# Patient Record
Sex: Female | Born: 1980 | Race: Black or African American | Hispanic: No | Marital: Married | Smoking: Never smoker
Health system: Southern US, Community
[De-identification: ages and names within clinical notes are randomized; demographics above are authoritative.]

## PROBLEM LIST (undated history)

## (undated) DIAGNOSIS — D571 Sickle-cell disease without crisis: Secondary | ICD-10-CM

## (undated) DIAGNOSIS — Z5189 Encounter for other specified aftercare: Secondary | ICD-10-CM

## (undated) DIAGNOSIS — R569 Unspecified convulsions: Secondary | ICD-10-CM

## (undated) DIAGNOSIS — I639 Cerebral infarction, unspecified: Secondary | ICD-10-CM

## (undated) DIAGNOSIS — D649 Anemia, unspecified: Secondary | ICD-10-CM

## (undated) DIAGNOSIS — G43909 Migraine, unspecified, not intractable, without status migrainosus: Secondary | ICD-10-CM

## (undated) HISTORY — PX: OTHER SURGICAL HISTORY: SHX169

## (undated) HISTORY — PX: PORTACATH PLACEMENT: SHX2246

## (undated) HISTORY — PX: CHOLECYSTECTOMY: SHX55

---
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
## 2015-03-11 ENCOUNTER — Inpatient Hospital Stay (HOSPITAL_COMMUNITY)
Admission: EM | Admit: 2015-03-11 | Discharge: 2015-03-13 | DRG: 812 | Disposition: A | Discharge status: Home / Self Care | Payer: Medicaid Other | Attending: Internal Medicine | Admitting: Internal Medicine

## 2015-03-11 ENCOUNTER — Emergency Department (HOSPITAL_COMMUNITY): Payer: Medicaid Other

## 2015-03-11 ENCOUNTER — Encounter (HOSPITAL_COMMUNITY): Payer: Self-pay | Admitting: *Deleted

## 2015-03-11 DIAGNOSIS — D649 Anemia, unspecified: Secondary | ICD-10-CM | POA: Diagnosis present

## 2015-03-11 DIAGNOSIS — Z23 Encounter for immunization: Secondary | ICD-10-CM

## 2015-03-11 DIAGNOSIS — D5700 Hb-SS disease with crisis, unspecified: Principal | ICD-10-CM | POA: Diagnosis present

## 2015-03-11 DIAGNOSIS — Z886 Allergy status to analgesic agent status: Secondary | ICD-10-CM

## 2015-03-11 DIAGNOSIS — D571 Sickle-cell disease without crisis: Secondary | ICD-10-CM | POA: Diagnosis not present

## 2015-03-11 DIAGNOSIS — I69354 Hemiplegia and hemiparesis following cerebral infarction affecting left non-dominant side: Secondary | ICD-10-CM

## 2015-03-11 DIAGNOSIS — Z79899 Other long term (current) drug therapy: Secondary | ICD-10-CM

## 2015-03-11 DIAGNOSIS — Z7982 Long term (current) use of aspirin: Secondary | ICD-10-CM

## 2015-03-11 DIAGNOSIS — Z8673 Personal history of transient ischemic attack (TIA), and cerebral infarction without residual deficits: Secondary | ICD-10-CM

## 2015-03-11 DIAGNOSIS — M21372 Foot drop, left foot: Secondary | ICD-10-CM | POA: Diagnosis present

## 2015-03-11 DIAGNOSIS — R569 Unspecified convulsions: Secondary | ICD-10-CM

## 2015-03-11 DIAGNOSIS — R0602 Shortness of breath: Secondary | ICD-10-CM

## 2015-03-11 DIAGNOSIS — Z79891 Long term (current) use of opiate analgesic: Secondary | ICD-10-CM

## 2015-03-11 DIAGNOSIS — Z885 Allergy status to narcotic agent status: Secondary | ICD-10-CM

## 2015-03-11 DIAGNOSIS — Z888 Allergy status to other drugs, medicaments and biological substances status: Secondary | ICD-10-CM

## 2015-03-11 HISTORY — DX: Sickle-cell disease without crisis: D57.1

## 2015-03-11 HISTORY — DX: Unspecified convulsions: R56.9

## 2015-03-11 LAB — CBC WITH DIFFERENTIAL/PLATELET
Basophils Absolute: 0 10*3/uL (ref 0.0–0.1)
Basophils Relative: 0 % (ref 0–1)
Eosinophils Absolute: 1.1 10*3/uL — ABNORMAL HIGH (ref 0.0–0.7)
Eosinophils Relative: 6 % — ABNORMAL HIGH (ref 0–5)
HCT: 18.6 % — ABNORMAL LOW (ref 36.0–46.0)
Hemoglobin: 6.8 g/dL — CL (ref 12.0–15.0)
Lymphocytes Relative: 29 % (ref 12–46)
Lymphs Abs: 5.5 10*3/uL — ABNORMAL HIGH (ref 0.7–4.0)
MCH: 36.2 pg — ABNORMAL HIGH (ref 26.0–34.0)
MCHC: 36.6 g/dL — ABNORMAL HIGH (ref 30.0–36.0)
MCV: 98.9 fL (ref 78.0–100.0)
Monocytes Absolute: 2.1 10*3/uL — ABNORMAL HIGH (ref 0.1–1.0)
Monocytes Relative: 11 % (ref 3–12)
Neutro Abs: 10.4 10*3/uL — ABNORMAL HIGH (ref 1.7–7.7)
Neutrophils Relative %: 54 % (ref 43–77)
Platelets: 495 10*3/uL — ABNORMAL HIGH (ref 150–400)
RBC: 1.88 MIL/uL — ABNORMAL LOW (ref 3.87–5.11)
RDW: 26.3 % — ABNORMAL HIGH (ref 11.5–15.5)
WBC: 19.1 10*3/uL — ABNORMAL HIGH (ref 4.0–10.5)

## 2015-03-11 LAB — COMPREHENSIVE METABOLIC PANEL
ALT: 36 U/L (ref 14–54)
AST: 79 U/L — ABNORMAL HIGH (ref 15–41)
Albumin: 4.3 g/dL (ref 3.5–5.0)
Alkaline Phosphatase: 114 U/L (ref 38–126)
Anion gap: 9 (ref 5–15)
BUN: 8 mg/dL (ref 6–20)
CO2: 26 mmol/L (ref 22–32)
Calcium: 9.4 mg/dL (ref 8.9–10.3)
Chloride: 100 mmol/L — ABNORMAL LOW (ref 101–111)
Creatinine, Ser: 0.54 mg/dL (ref 0.44–1.00)
GFR calc Af Amer: >60 mL/min (ref >60)
GFR calc non Af Amer: >60 mL/min (ref >60)
Glucose, Bld: 89 mg/dL (ref 65–99)
Potassium: 3.8 mmol/L (ref 3.5–5.1)
Sodium: 135 mmol/L (ref 135–145)
Total Bilirubin: 2.3 mg/dL — ABNORMAL HIGH (ref 0.3–1.2)
Total Protein: 8.4 g/dL — ABNORMAL HIGH (ref 6.5–8.1)

## 2015-03-11 LAB — I-STAT TROPONIN, ED: Troponin i, poc: 0.01 ng/mL (ref 0.00–0.08)

## 2015-03-11 MED ORDER — HYDROMORPHONE 0.3 MG/ML IV SOLN
INTRAVENOUS | Status: DC
  Administered 2015-03-12: 0.9 mg via INTRAVENOUS
  Administered 2015-03-12: 01:00:00 via INTRAVENOUS
  Administered 2015-03-12: 2 mg via INTRAVENOUS
  Administered 2015-03-12: 1.5 mg via INTRAVENOUS
  Filled 2015-03-11: qty 25

## 2015-03-11 MED ORDER — SODIUM CHLORIDE 0.9 % IV BOLUS (SEPSIS)
1000.0000 mL | Freq: Once | INTRAVENOUS | Status: AC
  Administered 2015-03-11: 1000 mL via INTRAVENOUS

## 2015-03-11 MED ORDER — LAMOTRIGINE 200 MG PO TABS
200.0000 mg | ORAL_TABLET | Freq: Two times a day (BID) | ORAL | Status: DC
  Administered 2015-03-11 – 2015-03-13 (×4): 200 mg via ORAL
  Filled 2015-03-11 (×4): qty 1

## 2015-03-11 MED ORDER — DOCUSATE SODIUM 100 MG PO CAPS
100.0000 mg | ORAL_CAPSULE | Freq: Every day | ORAL | Status: DC | PRN
  Administered 2015-03-11 – 2015-03-13 (×2): 100 mg via ORAL
  Filled 2015-03-11 (×2): qty 1

## 2015-03-11 MED ORDER — MORPHINE SULFATE 4 MG/ML IJ SOLN
4.0000 mg | Freq: Once | INTRAMUSCULAR | Status: AC
  Administered 2015-03-11: 4 mg via INTRAVENOUS
  Filled 2015-03-11: qty 1

## 2015-03-11 MED ORDER — PROMETHAZINE HCL 25 MG/ML IJ SOLN
25.0000 mg | INTRAMUSCULAR | Status: DC | PRN
  Administered 2015-03-11 – 2015-03-13 (×8): 25 mg via INTRAVENOUS
  Filled 2015-03-11 (×8): qty 1

## 2015-03-11 MED ORDER — OXYCODONE HCL ER 10 MG PO T12A
30.0000 mg | EXTENDED_RELEASE_TABLET | Freq: Two times a day (BID) | ORAL | Status: DC
  Administered 2015-03-11 – 2015-03-13 (×4): 30 mg via ORAL
  Filled 2015-03-11 (×4): qty 3

## 2015-03-11 MED ORDER — ASPIRIN EC 81 MG PO TBEC
81.0000 mg | DELAYED_RELEASE_TABLET | Freq: Every day | ORAL | Status: DC
  Administered 2015-03-12 – 2015-03-13 (×2): 81 mg via ORAL
  Filled 2015-03-11 (×2): qty 1

## 2015-03-11 MED ORDER — SENNOSIDES-DOCUSATE SODIUM 8.6-50 MG PO TABS
1.0000 | ORAL_TABLET | Freq: Two times a day (BID) | ORAL | Status: DC
  Administered 2015-03-12 – 2015-03-13 (×3): 1 via ORAL
  Filled 2015-03-11 (×3): qty 1

## 2015-03-11 MED ORDER — ONDANSETRON HCL 4 MG/2ML IJ SOLN: 4.0000 mg | Freq: Four times a day (QID) | INTRAMUSCULAR | Status: DC | PRN

## 2015-03-11 MED ORDER — SODIUM CHLORIDE 0.9 % IJ SOLN: 9.0000 mL | INTRAMUSCULAR | Status: DC | PRN

## 2015-03-11 MED ORDER — FOLIC ACID 1 MG PO TABS
1.0000 mg | ORAL_TABLET | Freq: Every day | ORAL | Status: DC
  Filled 2015-03-11: qty 1

## 2015-03-11 MED ORDER — POLYETHYLENE GLYCOL 3350 17 G PO PACK
17.0000 g | PACK | Freq: Every day | ORAL | Status: DC | PRN
  Administered 2015-03-12 – 2015-03-13 (×2): 17 g via ORAL
  Filled 2015-03-11 (×3): qty 1

## 2015-03-11 MED ORDER — DIPHENHYDRAMINE HCL 12.5 MG/5ML PO ELIX: 12.5000 mg | ORAL_SOLUTION | Freq: Four times a day (QID) | ORAL | Status: DC | PRN

## 2015-03-11 MED ORDER — DIPHENHYDRAMINE HCL 50 MG/ML IJ SOLN
25.0000 mg | Freq: Four times a day (QID) | INTRAMUSCULAR | Status: DC | PRN
  Administered 2015-03-11 – 2015-03-12 (×3): 25 mg via INTRAVENOUS
  Filled 2015-03-11 (×3): qty 1

## 2015-03-11 MED ORDER — MORPHINE SULFATE 4 MG/ML IJ SOLN
8.0000 mg | Freq: Once | INTRAMUSCULAR | Status: AC
  Administered 2015-03-11: 8 mg via INTRAVENOUS
  Filled 2015-03-11: qty 2

## 2015-03-11 MED ORDER — SODIUM CHLORIDE 0.9 % IV SOLN
Freq: Once | INTRAVENOUS | Status: AC
  Administered 2015-03-11: via INTRAVENOUS

## 2015-03-11 MED ORDER — LEVETIRACETAM 500 MG PO TABS
1000.0000 mg | ORAL_TABLET | Freq: Two times a day (BID) | ORAL | Status: DC
  Administered 2015-03-11 – 2015-03-13 (×4): 1000 mg via ORAL
  Filled 2015-03-11 (×5): qty 2

## 2015-03-11 MED ORDER — FOLIC ACID 1 MG PO TABS
2.0000 mg | ORAL_TABLET | Freq: Every day | ORAL | Status: DC
  Administered 2015-03-12 – 2015-03-13 (×2): 2 mg via ORAL
  Filled 2015-03-11 (×2): qty 2

## 2015-03-11 MED ORDER — HEPARIN SODIUM (PORCINE) 5000 UNIT/ML IJ SOLN
5000.0000 [IU] | Freq: Three times a day (TID) | INTRAMUSCULAR | Status: DC
  Administered 2015-03-11 – 2015-03-13 (×5): 5000 [IU] via SUBCUTANEOUS
  Filled 2015-03-11 (×6): qty 1

## 2015-03-11 MED ORDER — NALOXONE HCL 0.4 MG/ML IJ SOLN: 0.4000 mg | INTRAMUSCULAR | Status: DC | PRN

## 2015-03-11 MED ORDER — DIPHENHYDRAMINE HCL 50 MG/ML IJ SOLN
12.5000 mg | Freq: Four times a day (QID) | INTRAMUSCULAR | Status: DC | PRN
  Filled 2015-03-11: qty 1

## 2015-03-11 NOTE — ED Provider Notes (Signed)
CSN: [id number]     Arrival date & time 03/11/15  1843 History   First MD Initiated Contact with Patient 03/11/15 1914     Chief Complaint  Patient presents with  . Sickle Cell Pain Crisis     (Consider location/radiation/quality/duration/timing/severity/associated sxs/prior Treatment) HPI Comments: Patient with past medical history of sickle cell anemia presents to the emergency department with chief complaint of sickle cell pain crisis. She states that the pain started one week ago. She states that started in her ankles and has progressively moved to all of her joints including knees, hips, elbows, and shoulders. She also states that she has had some mild shortness of breath, which worsens when standing but denies any chest pain. He has tried taking OxyContin and oxycodone with no relief. She is new to [place], and recently moved from [place]. She is working on establishing care with the sickle cell clinic, but has been unable to do so. She denies any fevers or chills. There are no aggravating factors. Patient states that she thinks that her blood counts may be low.  The history is provided by the patient. No language interpreter was used.    Past Medical History  Diagnosis Date  . Sickle cell disease   . Seizures     one time incident, may have been r/t to a pain medication she was prescribed   Past Surgical History  Procedure Laterality Date  . Cholecystectomy    . Cesarean section      x4  . Reverse tubal ligation     No family history on file. History  Substance Use Topics  . Smoking status: Never Smoker   . Smokeless tobacco: Never Used  . Alcohol Use: No   OB History    No data available     Review of Systems  Constitutional: Negative for fever and chills.  Respiratory: Positive for shortness of breath.   Cardiovascular: Negative for chest pain.  Gastrointestinal: Negative for nausea, vomiting, diarrhea and constipation.  Genitourinary: Negative for  dysuria.  Musculoskeletal: Positive for arthralgias.  All other systems reviewed and are negative.     Allergies  Ibuprofen; Zofran; Other; Codeine; Dilaudid; and Hydrocodone  Home Medications   Prior to Admission medications   Medication Sig Start Date End Date Taking? Authorizing Provider  aspirin EC 81 MG tablet Take 1 tablet by mouth daily. 11/03/13  Yes Historical Provider, MD  docusate sodium (COLACE) 100 MG capsule Take 1 capsule by mouth daily as needed for moderate constipation.  03/09/14  Yes Historical Provider, MD  folic acid (FOLVITE) 1 MG tablet Take 2 tablets by mouth daily. 02/13/15  Yes Historical Provider, MD  lamoTRIgine (LAMICTAL) 200 MG tablet Take 1 tablet by mouth 2 (two) times daily. 02/13/15  Yes Historical Provider, MD  levETIRAcetam (KEPPRA) 1000 MG tablet Take 1 tablet by mouth 2 (two) times daily. 02/13/15  Yes Historical Provider, MD  Oxycodone HCl 10 MG TABS Take 1 tablet by mouth every 4 (four) hours. 02/13/15  Yes Historical Provider, MD  OxyCODONE HCl ER 30 MG T12A Take 1 tablet by mouth every 12 (twelve) hours. 02/13/15  Yes Historical Provider, MD  promethazine (PHENERGAN) 25 MG tablet Take 1 tablet by mouth every 8 (eight) hours as needed. n/v 02/13/15  Yes Historical Provider, MD  zolpidem (AMBIEN) 5 MG tablet Take 1 tablet by mouth at bedtime as needed. sleep 02/13/15  Yes Historical Provider, MD   LMP 02/18/2015 Physical Exam  Constitutional: She is oriented to  person, place, and time. She appears well-developed and well-nourished.  HENT:  Head: Normocephalic and atraumatic.  Eyes: Conjunctivae and EOM are normal. Pupils are equal, round, and reactive to light.  Neck: Normal range of motion. Neck supple.  Cardiovascular: Normal rate and regular rhythm.  Exam reveals no gallop and no friction rub.   No murmur heard. Pulmonary/Chest: Effort normal and breath sounds normal. No respiratory distress. She has no wheezes. She has no rales. She exhibits no  tenderness.  Abdominal: Soft. Bowel sounds are normal. She exhibits no distension and no mass. There is no tenderness. There is no rebound and no guarding.  Musculoskeletal: Normal range of motion. She exhibits no edema or tenderness.  Neurological: She is alert and oriented to person, place, and time.  Skin: Skin is warm and dry.  Psychiatric: She has a normal mood and affect. Her behavior is normal. Judgment and thought content normal.  Nursing note and vitals reviewed.   ED Course  Procedures (including critical care time) Results for orders placed or performed during the hospital encounter of 03/11/15  CBC with Differential/Platelet  Result Value Ref Range   WBC 19.1 (H) 4.0 - 10.5 K/uL   RBC 1.88 (L) 3.87 - 5.11 MIL/uL   Hemoglobin 6.8 (LL) 12.0 - 15.0 g/dL   HCT 16.118.6 (L) 09.636.0 - 04.546.0 %   MCV 98.9 78.0 - 100.0 fL   MCH 36.2 (H) 26.0 - 34.0 pg   MCHC 36.6 (H) 30.0 - 36.0 g/dL   RDW 40.926.3 (H) 81.111.5 - 91.415.5 %   Platelets 495 (H) 150 - 400 K/uL   Neutrophils Relative % 54 43 - 77 %   Lymphocytes Relative 29 12 - 46 %   Monocytes Relative 11 3 - 12 %   Eosinophils Relative 6 (H) 0 - 5 %   Basophils Relative 0 0 - 1 %   Neutro Abs 10.4 (H) 1.7 - 7.7 K/uL   Lymphs Abs 5.5 (H) 0.7 - 4.0 K/uL   Monocytes Absolute 2.1 (H) 0.1 - 1.0 K/uL   Eosinophils Absolute 1.1 (H) 0.0 - 0.7 K/uL   Basophils Absolute 0.0 0.0 - 0.1 K/uL   RBC Morphology HOWELL/JOLLY BODIES    WBC Morphology MILD LEFT SHIFT (1-5% METAS, OCC MYELO, OCC BANDS)   Comprehensive metabolic panel  Result Value Ref Range   Sodium 135 135 - 145 mmol/L   Potassium 3.8 3.5 - 5.1 mmol/L   Chloride 100 (L) 101 - 111 mmol/L   CO2 26 22 - 32 mmol/L   Glucose, Bld 89 65 - 99 mg/dL   BUN 8 6 - 20 mg/dL   Creatinine, Ser 7.820.54 0.44 - 1.00 mg/dL   Calcium 9.4 8.9 - 95.610.3 mg/dL   Total Protein 8.4 (H) 6.5 - 8.1 g/dL   Albumin 4.3 3.5 - 5.0 g/dL   AST 79 (H) 15 - 41 U/L   ALT 36 14 - 54 U/L   Alkaline Phosphatase 114 38 - 126 U/L    Total Bilirubin 2.3 (H) 0.3 - 1.2 mg/dL   GFR calc non Af Amer >60 >60 mL/min   GFR calc Af Amer >60 >60 mL/min   Anion gap 9 5 - 15  Reticulocytes  Result Value Ref Range   Retic Ct Pct RESULTS UNAVAILABLE DUE TO INTERFERING SUBSTANCE 0.4 - 3.1 %   RBC. 1.88 (L) 3.87 - 5.11 MIL/uL   Retic Count, Manual NOT CALCULATED 19.0 - 186.0 K/uL  I-Stat Troponin, ED (not at [facility])  Result  Value Ref Range   Troponin i, poc 0.01 0.00 - 0.08 ng/mL   Comment 3           Dg Chest 2 View  03/11/2015   CLINICAL DATA:  Shortness of breath x1 week  EXAM: CHEST  2 VIEW  COMPARISON:  None.  FINDINGS: Lungs are clear.  No pleural effusion or pneumothorax.  The heart is normal in size.  Visualized osseous structures are within normal limits.  IMPRESSION: No evidence of acute cardiopulmonary disease.   Electronically Signed   By: Charline Bills M.D.   On: 03/11/2015 20:17      EKG Interpretation   Date/Time:  Saturday Mar 11 2015 19:53:57 EDT Ventricular Rate:  88 PR Interval:  203 QRS Duration: 85 QT Interval:  391 QTC Calculation: 473 R Axis:   42 Text Interpretation:  Sinus rhythm Borderline prolonged PR interval No old  tracing to compare Confirmed by MILLER  MD, BRIAN ([id number]) on 03/11/2015  8:03:45 PM      MDM   Final diagnoses:  SOB (shortness of breath)  Sickle cell crisis  Symptomatic anemia    Patient with sickle cell disease, having pain crisis 1 week. We'll treat pain, check labs, will reassess.  Patient given several rounds of morphine. She is allergic to Dilaudid. She is repeatedly requesting Demerol, however patient has seizure history, do not feel that Demerol will be appropriate for her. Labs are pending. Patient seen by and discussed with Dr. Hyacinth Meeker. Anticipate admission.  Appreciate Dr. Julian Reil, who will admit the patient.  Recommends transfusing 1 unit.  Hgb is 6.8, trends 7-8.    Roxy Horseman, PA-C 03/11/15 2255  Eber Hong, MD 03/12/15 [phone]

## 2015-03-11 NOTE — H&P (Signed)
[facility] History and Physical  Debra Williamson ZOX:[id number] DOB: [birth year] DOA: 03/11/2015  Referring physician: EDP PCP: No PCP Per Patient   Chief Complaint: Sickle cell pain crisis   HPI: Debra Williamson is a 34 y.o. female with history of HGB-SS disease who presents to the ED with acute pain crisis.  Symptoms onset one week ago.  Started in ankles and progressively moved to all joints including knees, hips, elbows, shoulders.  Associated with fatigue and mild SOB.  SOB is worse with exertion.  No chest pain.  Oxy contin and oxycodone provide no relief.  New to [place] area, recently moved from [place].  Review of Systems: Systems reviewed.  As above, otherwise negative  Past Medical History  Diagnosis Date  . Sickle cell disease   . Seizures     one time incident, may have been r/t to a pain medication she was prescribed   Past Surgical History  Procedure Laterality Date  . Cholecystectomy    . Cesarean section      x4  . Reverse tubal ligation     Social History:  reports that she has never smoked. She has never used smokeless tobacco. She reports that she does not drink alcohol or use illicit drugs.  Allergies  Allergen Reactions  . Ibuprofen Hives  . Zofran [Ondansetron Hcl] Hives and Itching  . Other Itching    Greek Yogurt  . Codeine Hives and Itching  . Dilaudid [Hydromorphone Hcl] Hives and Itching  . Hydrocodone Hives and Itching    No family history on file.   Prior to Admission medications   Medication Sig Start Date End Date Taking? Authorizing Provider  aspirin EC 81 MG tablet Take 1 tablet by mouth daily. 11/03/13  Yes Historical Provider, MD  docusate sodium (COLACE) 100 MG capsule Take 1 capsule by mouth daily as needed for moderate constipation.  03/09/14  Yes Historical Provider, MD  folic acid (FOLVITE) 1 MG tablet Take 2 tablets by mouth daily. 02/13/15  Yes Historical Provider, MD  lamoTRIgine (LAMICTAL) 200 MG tablet Take  1 tablet by mouth 2 (two) times daily. 02/13/15  Yes Historical Provider, MD  levETIRAcetam (KEPPRA) 1000 MG tablet Take 1 tablet by mouth 2 (two) times daily. 02/13/15  Yes Historical Provider, MD  Oxycodone HCl 10 MG TABS Take 1 tablet by mouth every 4 (four) hours. 02/13/15  Yes Historical Provider, MD  OxyCODONE HCl ER 30 MG T12A Take 1 tablet by mouth every 12 (twelve) hours. 02/13/15  Yes Historical Provider, MD  promethazine (PHENERGAN) 25 MG tablet Take 1 tablet by mouth every 8 (eight) hours as needed. n/v 02/13/15  Yes Historical Provider, MD  zolpidem (AMBIEN) 5 MG tablet Take 1 tablet by mouth at bedtime as needed. sleep 02/13/15  Yes Historical Provider, MD   Physical Exam: Filed Vitals:   03/11/15 2253  BP:   Pulse: 98  Temp:   Resp:     BP 110/68 mmHg  Pulse 98  Temp(Src) 98 F (36.7 C) (Oral)  Resp 17  SpO2 97%  LMP 02/18/2015  General Appearance:    Alert, oriented, no distress, appears stated age  Head:    Normocephalic, atraumatic  Eyes:    PERRL, EOMI, sclera non-icteric        Nose:   Nares without drainage or epistaxis. Mucosa, turbinates normal  Throat:   Moist mucous membranes. Oropharynx without erythema or exudate.  Neck:   Supple. No carotid bruits.  No thyromegaly.  No lymphadenopathy.  Back:     No CVA tenderness, no spinal tenderness  Lungs:     Clear to auscultation bilaterally, without wheezes, rhonchi or rales  Chest wall:    No tenderness to palpitation  Heart:    Regular rate and rhythm without murmurs, gallops, rubs  Abdomen:     Soft, non-tender, nondistended, normal bowel sounds, no organomegaly  Genitalia:    deferred  Rectal:    deferred  Extremities:   No clubbing, cyanosis or edema.  Pulses:   2+ and symmetric all extremities  Skin:   Skin color, texture, turgor normal, no rashes or lesions  Lymph nodes:   Cervical, supraclavicular, and axillary nodes normal  Neurologic:   CNII-XII intact. Normal strength, sensation and reflexes       throughout    Labs on Admission:  Basic Metabolic Panel:  Recent Labs Lab 03/11/15 2028  NA 135  K 3.8  CL 100*  CO2 26  GLUCOSE 89  BUN 8  CREATININE 0.54  CALCIUM 9.4   Liver Function Tests:  Recent Labs Lab 03/11/15 2028  AST 79*  ALT 36  ALKPHOS 114  BILITOT 2.3*  PROT 8.4*  ALBUMIN 4.3   No results for input(s): LIPASE, AMYLASE in the last 168 hours. No results for input(s): AMMONIA in the last 168 hours. CBC:  Recent Labs Lab 03/11/15 2028  WBC 19.1*  NEUTROABS 10.4*  HGB 6.8*  HCT 18.6*  MCV 98.9  PLT 495*   Cardiac Enzymes: No results for input(s): CKTOTAL, CKMB, CKMBINDEX, TROPONINI in the last 168 hours.  BNP (last 3 results) No results for input(s): PROBNP in the last 8760 hours. CBG: No results for input(s): GLUCAP in the last 168 hours.  Radiological Exams on Admission: Dg Chest 2 View  03/11/2015   CLINICAL DATA:  Shortness of breath x1 week  EXAM: CHEST  2 VIEW  COMPARISON:  None.  FINDINGS: Lungs are clear.  No pleural effusion or pneumothorax.  The heart is normal in size.  Visualized osseous structures are within normal limits.  IMPRESSION: No evidence of acute cardiopulmonary disease.   Electronically Signed   By: Charline BillsSriyesh  Krishnan M.D.   On: 03/11/2015 20:17    EKG: Independently reviewed.  Assessment/Plan Principal Problem:   Hemoglobin S-S disease Active Problems:   Sickle cell crisis   Symptomatic anemia   1. Sickle cell Crisis -  1. Sickle cell pathway 2. Morphine not working for pain, I see where dilaudid has been used with benadryl in the ED in the past (2014).  Patient states she tolerates dilaudid with benadryl.  Will go ahead and order Dilaudid PCA full dose. 3. Continue home long acting oxycodone 2. Symptomatic anemia - 1. transfuse 1 unit PRBC for HGB of 6.8 2. Recheck CBC in AM. 3. H/o Seizures - 1. Continue Keppra 2. No Demerol (patient initially asked for this).    Code Status: Full Code  Family  Communication: No family in room Disposition Plan: Admit to obs   Time spent: 50 min  Catalino Plascencia M. [facility] Pager [phone]  If 7AM-7PM, please contact the day team taking care of the patient [email or web address] Password [email or web address] 03/11/2015, 11:05 PM

## 2015-03-11 NOTE — ED Notes (Addendum)
Patient c/o bilateral ankle, knee, shoulder and elbow pain related to sickle cell crisis x1 week.  Patient also c/o shortness of breath x1 week.  Patient also c/o nausea, but denies vomiting.  Patient denies fever.  Patient denies chest and abdominal pain.  Patient states she rarely has a [place] crisis.  Patient states she moved here in mid-February and does not have a PCP yet.  [facility] are pulled into chart via Care Everywhere.

## 2015-03-11 NOTE — ED Provider Notes (Signed)
The patient has a history of stroke secondary to sickle cell disease, also has a history of multiple sickle cell crisis in the past, has had approximately one week of worsening joint discomfort, states "I hurt all over" and refuses to be more specific. She did have some chest pain yesterday but has none today, no shortness of breath or fevers. On exam the patient has tenderness to palpation in all 4 extremities, she has no joint effusions, no fever, clear heart and lung sounds. She states that she has an allergy to Dilaudid, feels as though morphine is not strong enough, is requesting Demerol which we cannot provide. Likely will need to be admitted for pain control.  Medical screening examination/treatment/procedure(s) were conducted as a shared visit with non-physician practitioner(s) and myself.  I personally evaluated the patient during the encounter.  Clinical Impression:   Final diagnoses:  SOB (shortness of breath)  Sickle cell crisis  Symptomatic anemia         Eber HongBrian Tenae Graziosi, MD 03/12/15 1515

## 2015-03-12 ENCOUNTER — Encounter (HOSPITAL_COMMUNITY): Payer: Self-pay

## 2015-03-12 DIAGNOSIS — R569 Unspecified convulsions: Secondary | ICD-10-CM

## 2015-03-12 DIAGNOSIS — Z23 Encounter for immunization: Secondary | ICD-10-CM | POA: Diagnosis not present

## 2015-03-12 DIAGNOSIS — Z886 Allergy status to analgesic agent status: Secondary | ICD-10-CM | POA: Diagnosis not present

## 2015-03-12 DIAGNOSIS — M21372 Foot drop, left foot: Secondary | ICD-10-CM | POA: Diagnosis present

## 2015-03-12 DIAGNOSIS — D649 Anemia, unspecified: Secondary | ICD-10-CM | POA: Diagnosis not present

## 2015-03-12 DIAGNOSIS — D5700 Hb-SS disease with crisis, unspecified: Secondary | ICD-10-CM | POA: Diagnosis not present

## 2015-03-12 DIAGNOSIS — D571 Sickle-cell disease without crisis: Secondary | ICD-10-CM | POA: Diagnosis not present

## 2015-03-12 DIAGNOSIS — Z7982 Long term (current) use of aspirin: Secondary | ICD-10-CM | POA: Diagnosis not present

## 2015-03-12 DIAGNOSIS — Z8673 Personal history of transient ischemic attack (TIA), and cerebral infarction without residual deficits: Secondary | ICD-10-CM

## 2015-03-12 DIAGNOSIS — Z888 Allergy status to other drugs, medicaments and biological substances status: Secondary | ICD-10-CM | POA: Diagnosis not present

## 2015-03-12 DIAGNOSIS — Z79891 Long term (current) use of opiate analgesic: Secondary | ICD-10-CM | POA: Diagnosis not present

## 2015-03-12 DIAGNOSIS — I69354 Hemiplegia and hemiparesis following cerebral infarction affecting left non-dominant side: Secondary | ICD-10-CM | POA: Diagnosis not present

## 2015-03-12 DIAGNOSIS — Z79899 Other long term (current) drug therapy: Secondary | ICD-10-CM | POA: Diagnosis not present

## 2015-03-12 DIAGNOSIS — R52 Pain, unspecified: Secondary | ICD-10-CM | POA: Diagnosis not present

## 2015-03-12 DIAGNOSIS — Z885 Allergy status to narcotic agent status: Secondary | ICD-10-CM | POA: Diagnosis not present

## 2015-03-12 LAB — CBC WITH DIFFERENTIAL/PLATELET
Basophils Absolute: 0.1 10*3/uL (ref 0.0–0.1)
Basophils Relative: 1 % (ref 0–1)
Eosinophils Absolute: 0.9 10*3/uL — ABNORMAL HIGH (ref 0.0–0.7)
Eosinophils Relative: 6 % — ABNORMAL HIGH (ref 0–5)
HCT: 23.1 % — ABNORMAL LOW (ref 36.0–46.0)
Hemoglobin: 7.9 g/dL — ABNORMAL LOW (ref 12.0–15.0)
Lymphocytes Relative: 38 % (ref 12–46)
Lymphs Abs: 5.7 10*3/uL — ABNORMAL HIGH (ref 0.7–4.0)
MCH: 33.6 pg (ref 26.0–34.0)
MCHC: 34.2 g/dL (ref 30.0–36.0)
MCV: 98.3 fL (ref 78.0–100.0)
Monocytes Absolute: 1.6 10*3/uL — ABNORMAL HIGH (ref 0.1–1.0)
Monocytes Relative: 11 % (ref 3–12)
Neutro Abs: 6.6 10*3/uL (ref 1.7–7.7)
Neutrophils Relative %: 44 % (ref 43–77)
Platelets: 471 10*3/uL — ABNORMAL HIGH (ref 150–400)
RBC: 2.35 MIL/uL — ABNORMAL LOW (ref 3.87–5.11)
RDW: 28 % — ABNORMAL HIGH (ref 11.5–15.5)
WBC: 14.9 10*3/uL — ABNORMAL HIGH (ref 4.0–10.5)

## 2015-03-12 LAB — PREGNANCY, URINE: Preg Test, Ur: NEGATIVE

## 2015-03-12 LAB — CBC
HCT: 18.2 % — ABNORMAL LOW (ref 36.0–46.0)
Hemoglobin: 6.6 g/dL — CL (ref 12.0–15.0)
MCH: 36.3 pg — ABNORMAL HIGH (ref 26.0–34.0)
MCHC: 36.3 g/dL — ABNORMAL HIGH (ref 30.0–36.0)
MCV: 100 fL (ref 78.0–100.0)
Platelets: 526 10*3/uL — ABNORMAL HIGH (ref 150–400)
RBC: 1.82 MIL/uL — ABNORMAL LOW (ref 3.87–5.11)
RDW: 26.1 % — ABNORMAL HIGH (ref 11.5–15.5)
WBC: 18.7 10*3/uL — ABNORMAL HIGH (ref 4.0–10.5)

## 2015-03-12 LAB — ABO/RH: ABO/RH(D): A POS

## 2015-03-12 LAB — RETICULOCYTES
RBC.: 1.88 MIL/uL — ABNORMAL LOW (ref 3.87–5.11)
RBC.: 2.35 MIL/uL — ABNORMAL LOW (ref 3.87–5.11)
Retic Ct Pct: >23 % — ABNORMAL HIGH (ref 0.4–3.1)
Retic Ct Pct: >23 % — ABNORMAL HIGH (ref 0.4–3.1)

## 2015-03-12 LAB — LACTATE DEHYDROGENASE: LDH: 528 U/L — ABNORMAL HIGH (ref 98–192)

## 2015-03-12 LAB — PREPARE RBC (CROSSMATCH)

## 2015-03-12 MED ORDER — CETYLPYRIDINIUM CHLORIDE 0.05 % MT LIQD
7.0000 mL | Freq: Two times a day (BID) | OROMUCOSAL | Status: DC
  Administered 2015-03-12 – 2015-03-13 (×4): 7 mL via OROMUCOSAL

## 2015-03-12 MED ORDER — PNEUMOCOCCAL VAC POLYVALENT 25 MCG/0.5ML IJ INJ
0.5000 mL | INJECTION | INTRAMUSCULAR | Status: AC
  Administered 2015-03-13: 0.5 mL via INTRAMUSCULAR
  Filled 2015-03-12 (×2): qty 0.5

## 2015-03-12 MED ORDER — DIPHENHYDRAMINE HCL 12.5 MG/5ML PO ELIX
12.5000 mg | ORAL_SOLUTION | Freq: Four times a day (QID) | ORAL | Status: DC | PRN
  Filled 2015-03-12 (×2): qty 5

## 2015-03-12 MED ORDER — SODIUM CHLORIDE 0.9 % IV SOLN
12.5000 mg | Freq: Four times a day (QID) | INTRAVENOUS | Status: DC | PRN
  Administered 2015-03-12 – 2015-03-13 (×4): 12.5 mg via INTRAVENOUS
  Filled 2015-03-12 (×9): qty 0.25

## 2015-03-12 MED ORDER — ONDANSETRON HCL 4 MG/2ML IJ SOLN: 4.0000 mg | Freq: Four times a day (QID) | INTRAMUSCULAR | Status: DC | PRN

## 2015-03-12 MED ORDER — HYDROMORPHONE 2 MG/ML HIGH CONCENTRATION IV PCA SOLN
INTRAVENOUS | Status: DC
  Administered 2015-03-12: 1.6 mg via INTRAVENOUS
  Administered 2015-03-12: 4 mg via INTRAVENOUS
  Administered 2015-03-12: 2 mg via INTRAVENOUS
  Administered 2015-03-13: 3.5 mg via INTRAVENOUS
  Administered 2015-03-13: 0.5 mg via INTRAVENOUS
  Administered 2015-03-13: 1 mg via INTRAVENOUS
  Filled 2015-03-12 (×2): qty 25

## 2015-03-12 MED ORDER — SODIUM CHLORIDE 0.9 % IJ SOLN: 9.0000 mL | INTRAMUSCULAR | Status: DC | PRN

## 2015-03-12 MED ORDER — DEXTROSE-NACL 5-0.45 % IV SOLN
INTRAVENOUS | Status: DC
  Administered 2015-03-12 – 2015-03-13 (×3): via INTRAVENOUS

## 2015-03-12 MED ORDER — NALOXONE HCL 0.4 MG/ML IJ SOLN: 0.4000 mg | INTRAMUSCULAR | Status: DC | PRN

## 2015-03-12 MED ORDER — DIPHENHYDRAMINE HCL 50 MG/ML IJ SOLN
12.5000 mg | Freq: Once | INTRAMUSCULAR | Status: AC
  Administered 2015-03-12: 12.5 mg via INTRAVENOUS

## 2015-03-12 NOTE — Progress Notes (Signed)
Delay in getting labs as pt is a difficult stick and not enough blood was obtained previously to get an LDH. Will continue a few more attempts to obtain blood. Pt is not expected to have a long inpatient stay, so I would like to defer placing a PICC for now. She is stable, so if blood is not able to be obtained, then we will reattempt with morning labs.  Debra Williamson. Lavergne Hiltunen MD

## 2015-03-12 NOTE — Progress Notes (Signed)
Patient ID: Debra DellShanequa Diprima, female   DOB: [birth year], 34 y.o.   MRN: [id number] SICKLE CELL SERVICE PROGRESS NOTE  Debra Williamson [id number] DOB: [birth year] DOA: 03/11/2015 PCP: No PCP Per Patient    Consultants:  none  Procedures:  none  Antibiotics:  none  HPI/Subjective: Pt reports having pain mainly in her joints. She states that pain in those areas are typical for her crisis. She rates pain as a 7/10. She denies any swelling or trauma to joint areas. She reports having some weakness and dizziness prior to coming to ED, but symptoms have resolved. She feels some relief with current pain regimen, but pain is not going less than a 7/10. She takes oxycodone at home for pain. She reports not usually being hospitalized for pain. She states that she is new to the area. [place].  She was getting medication filled by her physician there. 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 She reports not being on hydroxyurea for many years. Pt is trying to get pregnant with husband.  Objective: Filed Vitals:   03/12/15 1013 03/12/15 1134 03/12/15 1400 03/12/15 1519  BP: 102/54  103/56   Pulse: 75  79   Temp: 98.1 F (36.7 C)  98.9 F (37.2 C)   TempSrc: Oral  Oral   Resp: 14 9 16 11   Height:      Weight:      SpO2: 98% 96% 100% 100%   Weight change:   Intake/Output Summary (Last 24 hours) at 03/12/15 1628 Last data filed at 03/12/15 1402  Gross per 24 hour  Intake    660 ml  Output    500 ml  Net    160 ml    General: Alert, awake, oriented x3, in no acute distress. Sitting up in bed. HEENT: [place]/AT PEERL, EOMI, 1+ scleral icterus. Neck: Trachea midline,  no masses or lymphadenopathy OROPHARYNX:  Moist, No exudate/ erythema/lesions.  Heart: Regular rate and rhythm, without murmurs, rubs, gallops Lungs: Clear to auscultation, no wheezing or rhonchi noted.   Abdomen: Soft, nontender, nondistended, positive bowel sounds, no masses no  hepatosplenomegaly noted.  Musculoskeletal: No warmth, swelling or erythema around joints, no spinal tenderness noted. Neuro: exam deferred due to pain Psychiatric: Patient alert and oriented x3, good insight and cognition, good recent to remote recall. Lymph node survey: No cervical axillary or inguinal lymphadenopathy noted. Extremities: no cyanosis, or edema  Data Reviewed: Basic Metabolic Panel:  Recent Labs Lab 03/11/15 2028  NA 135  K 3.8  CL 100*  CO2 26  GLUCOSE 89  BUN 8  CREATININE 0.54  CALCIUM 9.4   Liver Function Tests:  Recent Labs Lab 03/11/15 2028  AST 79*  ALT 36  ALKPHOS 114  BILITOT 2.3*  PROT 8.4*  ALBUMIN 4.3   No results for input(s): LIPASE, AMYLASE in the last 168 hours. No results for input(s): AMMONIA in the last 168 hours. CBC:  Recent Labs Lab 03/11/15 2028 03/12/15 0100  WBC 19.1* 18.7*  NEUTROABS 10.4*  --   HGB 6.8* 6.6*  HCT 18.6* 18.2*  MCV 98.9 100.0  PLT 495* 526*   Cardiac Enzymes: No results for input(s): CKTOTAL, CKMB, CKMBINDEX, TROPONINI in the last 168 hours. BNP (last 3 results) No results for input(s): BNP in the last 8760 hours.  ProBNP (last 3 results) No results for input(s): PROBNP in the last 8760 hours.  CBG: No results for input(s): GLUCAP in the last  168 hours.  No results found for this or any previous visit (from the past 240 hour(s)).   Studies: Dg Chest 2 View  03/11/2015   CLINICAL DATA:  Shortness of breath x1 week  EXAM: CHEST  2 VIEW  COMPARISON:  None.  FINDINGS: Lungs are clear.  No pleural effusion or pneumothorax.  The heart is normal in size.  Visualized osseous structures are within normal limits.  IMPRESSION: No evidence of acute cardiopulmonary disease.   Electronically Signed   By: Charline Bills M.D.   On: 03/11/2015 20:17    Scheduled Meds: . antiseptic oral rinse  7 mL Mouth Rinse BID  . aspirin EC  81 mg Oral Daily  . folic acid  2 mg Oral Daily  . heparin  5,000 Units  Subcutaneous 3 times per day  . HYDROmorphone PCA 2 mg/mL   Intravenous 6 times per day  . lamoTRIgine  200 mg Oral BID  . levETIRAcetam  1,000 mg Oral BID  . OxyCODONE  30 mg Oral Q12H  . [START ON 03/13/2015] pneumococcal 23 valent vaccine  0.5 mL Intramuscular Tomorrow-1000  . senna-docusate  1 tablet Oral BID   Continuous Infusions: . dextrose 5 % and 0.45% NaCl 125 mL/hr at 03/12/15 1259    Principal Problem:   Hemoglobin S-S disease Active Problems:   Sickle cell crisis   Symptomatic anemia   H/O: stroke   Seizures   Assessment/Plan: Principal Problem:   Hemoglobin S-S disease Active Problems:   Sickle cell crisis   Symptomatic anemia   H/O: stroke   Seizures  1)Sickle Cell Crisis: Care everywhere was reviewed. Pt has Hgb SS disease. She followed with Dr. Norm Parcel at [facility] in [place], [place].  Pt presented with pain consistent with VOC. At home pt takes Oxycodone ER  q12h, and Oxycodone  q4h PRN, giving her a MME of 180. Pt's pain is currently a 7/10. Since she is opioid tolerant and feeling minimal relief with current PCA setting, I will switch her to a high dose Dilaudid PCA of 0.5mg  q10 min. I will reassess her and adjust PCA as necessary. Will avoid NSAIDs as she reports an allergy to ibuprofen. Continue Oxycodone ER  for long acting control.   2) Anemia: Hgb of 6.6 post 1 unit PRBC. According to records, her baseline Hgb is 7.8. Anemia is likely due to hemolysis. Will add on LDH to morning labs. Will f/u Hgb with CBC today.  3)History of stroke and seizures: Noted to have left sided hemiparesis and left foot drop in records. She is on Keppra and Lamictal. Will continue her home meds.   4)Sickle Cell Care: Continue Folic acid. Not on Hydrea.  5)Family Planning: Pt had 4 births via C-section and 1 miscarriage. She has a total of 8 children in her home. She reports that her and her husband are trying to get pregnant. Will send urine  pregnancy.  6) FEN/GI : Regular Diet  IV fluids per protocol-D5/0.45% /hr Bowel regimen in place   Code Status: Full  DVT Prophylaxis: heparin  Family Communication: none  Disposition Plan: pending improvement. Pt will need to establish care with a new PCP and neurologist. Time spent:>3min  Serina Cowper  Pager [phone]. If 7PM-7AM, please contact night-coverage.  03/12/2015, 4:28 PM  LOS: 0 days   Serina Cowper

## 2015-03-13 DIAGNOSIS — D649 Anemia, unspecified: Secondary | ICD-10-CM

## 2015-03-13 LAB — BASIC METABOLIC PANEL
Anion gap: 7 (ref 5–15)
BUN: 9 mg/dL (ref 6–20)
CO2: 26 mmol/L (ref 22–32)
Calcium: 8.8 mg/dL — ABNORMAL LOW (ref 8.9–10.3)
Chloride: 106 mmol/L (ref 101–111)
Creatinine, Ser: 0.6 mg/dL (ref 0.44–1.00)
GFR calc Af Amer: >60 mL/min (ref >60)
GFR calc non Af Amer: >60 mL/min (ref >60)
Glucose, Bld: 104 mg/dL — ABNORMAL HIGH (ref 65–99)
Potassium: 4.1 mmol/L (ref 3.5–5.1)
Sodium: 139 mmol/L (ref 135–145)

## 2015-03-13 LAB — CBC WITH DIFFERENTIAL/PLATELET
Basophils Absolute: 0.1 10*3/uL (ref 0.0–0.1)
Basophils Relative: 1 % (ref 0–1)
Eosinophils Absolute: 1.1 10*3/uL — ABNORMAL HIGH (ref 0.0–0.7)
Eosinophils Relative: 8 % — ABNORMAL HIGH (ref 0–5)
HCT: 22.9 % — ABNORMAL LOW (ref 36.0–46.0)
Hemoglobin: 8 g/dL — ABNORMAL LOW (ref 12.0–15.0)
Lymphocytes Relative: 35 % (ref 12–46)
Lymphs Abs: 4.9 10*3/uL — ABNORMAL HIGH (ref 0.7–4.0)
MCH: 34 pg (ref 26.0–34.0)
MCHC: 34.9 g/dL (ref 30.0–36.0)
MCV: 97.4 fL (ref 78.0–100.0)
Monocytes Absolute: 1.6 10*3/uL — ABNORMAL HIGH (ref 0.1–1.0)
Monocytes Relative: 11 % (ref 3–12)
Neutro Abs: 6.4 10*3/uL (ref 1.7–7.7)
Neutrophils Relative %: 45 % (ref 43–77)
Platelets: 539 10*3/uL — ABNORMAL HIGH (ref 150–400)
RBC: 2.35 MIL/uL — ABNORMAL LOW (ref 3.87–5.11)
RDW: 27.8 % — ABNORMAL HIGH (ref 11.5–15.5)
WBC: 14.1 10*3/uL — ABNORMAL HIGH (ref 4.0–10.5)

## 2015-03-13 LAB — TYPE AND SCREEN
ABO/RH(D): A POS
Antibody Screen: NEGATIVE
Unit division: 0

## 2015-03-13 LAB — LACTATE DEHYDROGENASE: LDH: 339 U/L — ABNORMAL HIGH (ref 98–192)

## 2015-03-13 LAB — RETICULOCYTES
RBC.: 2.35 MIL/uL — ABNORMAL LOW (ref 3.87–5.11)
Retic Ct Pct: >23 % — ABNORMAL HIGH (ref 0.4–3.1)

## 2015-03-13 MED ORDER — ZOLPIDEM TARTRATE 5 MG PO TABS: 5.0000 mg | ORAL_TABLET | Freq: Every evening | ORAL | Status: DC | PRN

## 2015-03-13 MED ORDER — NALOXEGOL OXALATE 25 MG PO TABS
25.0000 mg | ORAL_TABLET | Freq: Every day | ORAL | Status: DC
  Administered 2015-03-13: 25 mg via ORAL
  Filled 2015-03-13: qty 1

## 2015-03-13 MED ORDER — DIPHENHYDRAMINE HCL 12.5 MG/5ML PO ELIX: 12.5000 mg | ORAL_SOLUTION | Freq: Four times a day (QID) | ORAL | Status: DC | PRN

## 2015-03-13 MED ORDER — DIPHENHYDRAMINE HCL 50 MG/ML IJ SOLN
25.0000 mg | Freq: Four times a day (QID) | INTRAMUSCULAR | Status: DC | PRN
  Administered 2015-03-13: 25 mg via INTRAVENOUS
  Filled 2015-03-13 (×3): qty 0.5

## 2015-03-13 NOTE — Care Management Note (Signed)
Case Management Note  Patient Details  Name: Debra Williamson MRN: [id number] Date of Birth: 1981/09/02  Subjective/Objective:    34 y/o f admitted w/SSC.                Action/Plan:d/c home no needs or orders.   Expected Discharge Date:                  Expected Discharge Plan:  Home/Self Care  In-House Referral:     Discharge planning Services  CM Consult  Post Acute Care Choice:    Choice offered to:     DME Arranged:    DME Agency:     HH Arranged:    HH Agency:     Status of Service:  Completed, signed off  Medicare Important Message Given:    Date Medicare IM Given:    Medicare IM give by:    Date Additional Medicare IM Given:    Additional Medicare Important Message give by:     If discussed at Long Length of Stay Meetings, dates discussed:    Additional Comments:  Lanier ClamMahabir, Naasia Weilbacher, RN 03/13/2015, 1:03 PM

## 2015-03-13 NOTE — Progress Notes (Signed)
Wasted 20ml of Dilaudid from the PCA; pt is discharged home.

## 2015-03-13 NOTE — Discharge Summary (Signed)
Debra Williamson MRN: [id number] DOB/AGE: September 18, 1981 33 y.o.  Admit date: 03/11/2015 Discharge date: 03/13/2015  Primary Care Physician:  No PCP Per Patient   Discharge Diagnoses:   Patient Active Problem List   Diagnosis Date Noted  . H/O: stroke 03/12/2015  . Seizures 03/12/2015  . 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 Sickle cell crisis 03/11/2015  . Symptomatic anemia 03/11/2015    DISCHARGE MEDICATION:   Medication List    TAKE these medications        aspirin EC 81 MG tablet  Take 1 tablet by mouth daily.     docusate sodium 100 MG capsule  Commonly known as:  COLACE  Take 1 capsule by mouth daily as needed for moderate constipation.     folic acid 1 MG tablet  Commonly known as:  FOLVITE  Take 2 tablets by mouth daily.     lamoTRIgine 200 MG tablet  Commonly known as:  LAMICTAL  Take 1 tablet by mouth 2 (two) times daily.     levETIRAcetam 1000 MG tablet  Commonly known as:  KEPPRA  Take 1 tablet by mouth 2 (two) times daily.     OxyCODONE HCl ER 30 MG T12a  Take 1 tablet by mouth every 12 (twelve) hours.     Oxycodone HCl 10 MG Tabs  Take 1 tablet by mouth every 4 (four) hours as needed (pain).     promethazine 25 MG tablet  Commonly known as:  PHENERGAN  Take 1 tablet by mouth every 8 (eight) hours as needed. n/v     zolpidem 5 MG tablet  Commonly known as:  AMBIEN  Take 1 tablet (5 mg total) by mouth at bedtime as needed. sleep          SIGNIFICANT DIAGNOSTIC STUDIES:  Dg Chest 2 View  03/11/2015   CLINICAL DATA:  Shortness of breath x1 week  EXAM: CHEST  2 VIEW  COMPARISON:  None.  FINDINGS: Lungs are clear.  No pleural effusion or pneumothorax.  The heart is normal in size.  Visualized osseous structures are within normal limits.  IMPRESSION: No evidence of acute cardiopulmonary disease.   Electronically Signed   By: Charline Bills M.D.   On: 03/11/2015 20:17     No results found for this or any previous visit (from the past 240  hour(s)).  BRIEF ADMITTING H & P: Debra Williamson is a 34 y.o. female with history of HGB-SS disease who presents to the ED with acute pain crisis. Symptoms onset one week ago. Started in ankles and progressively moved to all joints including knees, hips, elbows, shoulders. Associated with fatigue and mild SOB. SOB is worse with exertion. No chest pain. Oxy contin and oxycodone provide no relief. New to [place] area, recently moved from [place].   Hospital Course:  Present on Admission:  . Hemoglobin SS disease with crisis: Pt was admitted in crisis. She was managed with IVF and Dialudid via PCA. NSAID's were not used in managing her pain as she reports anaphylaxis with use of NSAID's. Pt was very eager to be discharged to home as she has young kids that she needed to get home to. She felt that she was at a point in her pain which she could manage at home with oral analgesics. She had no medical risk requiring further hospitalization.   . H/O Seizure: Pt has no seizure activity while hospitalized. Continue Keppra and Lamictal.  . Symptomatic anemia: Pt reports a baseline Hb of 7.8. At the  time of admission she received 1 unit RBC;s and her Hb at the time of D/C is 8.0.   Disposition and Follow-up: Pt is discharged in good condition. She is in te process of establishing with a PMD. She has been [facility]     Discharge Instructions    Activity as tolerated - No restrictions    Complete by:  As directed      Diet general    Complete by:  As directed            DISCHARGE EXAM:  General: Alert, awake, oriented x3, in NAD.  Vital Signs: BP 115/59, HR 83, T 98.1 F (36.7 C), temperature source Oral, RR 14, height 5\' 4"  (1.626 m), weight 156 lb 11.2 oz (71.079 kg), last menstrual period 02/18/2015, SpO2 100 %. HEENT: [place]/AT PEERL, EOMI, anicteric. Neck: Trachea midline, no masses, no thyromegal,y no JVD, no carotid bruit OROPHARYNX:  Moist, No exudate/ erythema/lesions.  Heart: Regular rate and rhythm, without murmurs, rubs, gallops or S3. PMI non-displaced. Exam reveals no decreased pulses. Pulmonary/Chest: Normal effort. Breath sounds normal. No. Apnea. Clear to auscultation,no stridor,  no wheezing and no rhonchi noted. No respiratory distress and no tenderness noted. Abdomen: Soft, nontender, nondistended, normal bowel sounds, no masses no hepatosplenomegaly noted. No fluid wave and no ascites. There is no guarding or rebound. Neuro: Alert and oriented to person, place and time. Normal motor skills, Displays no atrophy or tremors and exhibits normal muscle tone.  No focal neurological deficits noted cranial nerves II through XII grossly intact. No sensory deficit noted. Strength at baseline in bilateral upper and lower extremities. Gait normal. Musculoskeletal: No warm swelling or erythema around joints, no spinal tenderness noted. Psychiatric: Patient alert and oriented x3, good insight and cognition, good recent to remote recall. Lymph node survey: No cervical axillary or inguinal lymphadenopathy noted. Skin: Skin is warm and dry.  Psychiatric: Mood, memory, affect and judgement normal     Recent Labs  03/11/15 2028 03/13/15 0458  NA 135 139  K 3.8 4.1  CL 100* 106  CO2 26 26  GLUCOSE 89 104*  BUN 8 9  CREATININE 0.54 0.60  CALCIUM 9.4 8.8*    Recent Labs  03/11/15 2028  AST 79*  ALT 36  ALKPHOS 114  BILITOT 2.3*  PROT 8.4*  ALBUMIN 4.3   No results for input(s): LIPASE, AMYLASE in the last 72 hours.  Recent Labs  03/12/15 1830 03/13/15 0458  WBC 14.9* 14.1*  NEUTROABS 6.6 6.4  HGB 7.9* 8.0*  HCT 23.1* 22.9*  MCV 98.3 97.4  PLT 471* 539*     Total time spent including face to face and decision making was greater than 30 minutes  Signed: Charleton Deyoung A. 03/13/2015, 12:22 PM

## 2015-03-13 NOTE — Progress Notes (Signed)
Pt ambulated up the hallway without any shortness of breath or respiratory distress. Pts oxygen saturation prior to ambulating was 100% on RA, while ambulating oxygen saturation maintained 98% RA, and after ambulating pt's oxygen saturations were 98% on RA.

## 2015-03-13 NOTE — Progress Notes (Signed)
Pt discharged home with spouse. Pt verbalized understanding discharge instructions, prescriptions, and follow-up appts. Pt denied any complaints or concerns at the time of discharge.

## 2015-05-30 DIAGNOSIS — D571 Sickle-cell disease without crisis: Secondary | ICD-10-CM | POA: Insufficient documentation

## 2015-05-30 DIAGNOSIS — M25373 Other instability, unspecified ankle: Secondary | ICD-10-CM | POA: Insufficient documentation

## 2015-05-30 DIAGNOSIS — G40909 Epilepsy, unspecified, not intractable, without status epilepticus: Secondary | ICD-10-CM | POA: Insufficient documentation

## 2015-05-30 DIAGNOSIS — I69359 Hemiplegia and hemiparesis following cerebral infarction affecting unspecified side: Secondary | ICD-10-CM | POA: Insufficient documentation

## 2015-08-08 ENCOUNTER — Encounter (HOSPITAL_COMMUNITY): Payer: Self-pay | Admitting: Emergency Medicine

## 2015-08-08 ENCOUNTER — Inpatient Hospital Stay (HOSPITAL_COMMUNITY)
Admission: EM | Admit: 2015-08-08 | Discharge: 2015-08-14 | DRG: 193 | Disposition: A | Discharge status: Home / Self Care | Payer: Medicaid Other | Attending: Internal Medicine | Admitting: Internal Medicine

## 2015-08-08 ENCOUNTER — Emergency Department (HOSPITAL_COMMUNITY): Payer: Medicaid Other

## 2015-08-08 DIAGNOSIS — D638 Anemia in other chronic diseases classified elsewhere: Secondary | ICD-10-CM | POA: Diagnosis not present

## 2015-08-08 DIAGNOSIS — D5701 Hb-SS disease with acute chest syndrome: Secondary | ICD-10-CM | POA: Diagnosis present

## 2015-08-08 DIAGNOSIS — Z830 Family history of human immunodeficiency virus [HIV] disease: Secondary | ICD-10-CM | POA: Diagnosis not present

## 2015-08-08 DIAGNOSIS — E876 Hypokalemia: Secondary | ICD-10-CM | POA: Diagnosis not present

## 2015-08-08 DIAGNOSIS — G40909 Epilepsy, unspecified, not intractable, without status epilepticus: Secondary | ICD-10-CM | POA: Diagnosis present

## 2015-08-08 DIAGNOSIS — J9621 Acute and chronic respiratory failure with hypoxia: Secondary | ICD-10-CM | POA: Diagnosis not present

## 2015-08-08 DIAGNOSIS — J189 Pneumonia, unspecified organism: Secondary | ICD-10-CM | POA: Diagnosis present

## 2015-08-08 DIAGNOSIS — D5700 Hb-SS disease with crisis, unspecified: Secondary | ICD-10-CM

## 2015-08-08 DIAGNOSIS — Z8673 Personal history of transient ischemic attack (TIA), and cerebral infarction without residual deficits: Secondary | ICD-10-CM

## 2015-08-08 DIAGNOSIS — J9601 Acute respiratory failure with hypoxia: Secondary | ICD-10-CM | POA: Diagnosis present

## 2015-08-08 DIAGNOSIS — R52 Pain, unspecified: Secondary | ICD-10-CM

## 2015-08-08 DIAGNOSIS — R0602 Shortness of breath: Secondary | ICD-10-CM | POA: Diagnosis present

## 2015-08-08 DIAGNOSIS — D571 Sickle-cell disease without crisis: Secondary | ICD-10-CM

## 2015-08-08 HISTORY — DX: Cerebral infarction, unspecified: I63.9

## 2015-08-08 HISTORY — DX: Encounter for other specified aftercare: Z51.89

## 2015-08-08 HISTORY — DX: Anemia, unspecified: D64.9

## 2015-08-08 LAB — COMPREHENSIVE METABOLIC PANEL
ALT: 24 U/L (ref 14–54)
AST: 45 U/L — ABNORMAL HIGH (ref 15–41)
Albumin: 4 g/dL (ref 3.5–5.0)
Alkaline Phosphatase: 84 U/L (ref 38–126)
Anion gap: 9 (ref 5–15)
BUN: <5 mg/dL — ABNORMAL LOW (ref 6–20)
CO2: 23 mmol/L (ref 22–32)
Calcium: 8.9 mg/dL (ref 8.9–10.3)
Chloride: 107 mmol/L (ref 101–111)
Creatinine, Ser: 0.41 mg/dL — ABNORMAL LOW (ref 0.44–1.00)
GFR calc Af Amer: >60 mL/min (ref >60)
GFR calc non Af Amer: >60 mL/min (ref >60)
Glucose, Bld: 86 mg/dL (ref 65–99)
Potassium: 3.4 mmol/L — ABNORMAL LOW (ref 3.5–5.1)
Sodium: 139 mmol/L (ref 135–145)
Total Bilirubin: 2.6 mg/dL — ABNORMAL HIGH (ref 0.3–1.2)
Total Protein: 7.6 g/dL (ref 6.5–8.1)

## 2015-08-08 LAB — CBC WITH DIFFERENTIAL/PLATELET
Band Neutrophils: 0 %
Basophils Absolute: 0 10*3/uL (ref 0.0–0.1)
Basophils Relative: 0 %
Blasts: 0 %
Eosinophils Absolute: 0.5 10*3/uL (ref 0.0–0.7)
Eosinophils Relative: 3 %
HCT: 18.7 % — ABNORMAL LOW (ref 36.0–46.0)
Hemoglobin: 6.4 g/dL — CL (ref 12.0–15.0)
Lymphocytes Relative: 15 %
Lymphs Abs: 2.6 10*3/uL (ref 0.7–4.0)
MCH: 35 pg — ABNORMAL HIGH (ref 26.0–34.0)
MCHC: 34.2 g/dL (ref 30.0–36.0)
MCV: 102.2 fL — ABNORMAL HIGH (ref 78.0–100.0)
Metamyelocytes Relative: 0 %
Monocytes Absolute: 1 10*3/uL (ref 0.1–1.0)
Monocytes Relative: 6 %
Myelocytes: 0 %
Neutro Abs: 13.2 10*3/uL — ABNORMAL HIGH (ref 1.7–7.7)
Neutrophils Relative %: 76 %
Other: 0 %
Platelets: 472 10*3/uL — ABNORMAL HIGH (ref 150–400)
Promyelocytes Absolute: 0 %
RBC: 1.83 MIL/uL — ABNORMAL LOW (ref 3.87–5.11)
RDW: 24.1 % — ABNORMAL HIGH (ref 11.5–15.5)
WBC: 17.3 10*3/uL — ABNORMAL HIGH (ref 4.0–10.5)
nRBC: 5 /100 WBC — ABNORMAL HIGH

## 2015-08-08 LAB — LACTATE DEHYDROGENASE: LDH: 380 U/L — ABNORMAL HIGH (ref 98–192)

## 2015-08-08 LAB — RETICULOCYTES
RBC.: 1.83 MIL/uL — ABNORMAL LOW (ref 3.87–5.11)
Retic Ct Pct: >23 % — ABNORMAL HIGH (ref 0.4–3.1)

## 2015-08-08 LAB — TROPONIN I: Troponin I: <0.03 ng/mL (ref <0.031)

## 2015-08-08 LAB — PREPARE RBC (CROSSMATCH)

## 2015-08-08 LAB — MAGNESIUM: Magnesium: 1.9 mg/dL (ref 1.7–2.4)

## 2015-08-08 MED ORDER — NALOXONE HCL 0.4 MG/ML IJ SOLN: 0.4000 mg | INTRAMUSCULAR | Status: DC | PRN

## 2015-08-08 MED ORDER — ENOXAPARIN SODIUM 40 MG/0.4ML ~~LOC~~ SOLN
40.0000 mg | Freq: Every day | SUBCUTANEOUS | Status: DC
  Administered 2015-08-08 – 2015-08-09 (×2): 40 mg via SUBCUTANEOUS
  Filled 2015-08-08 (×3): qty 0.4

## 2015-08-08 MED ORDER — DIPHENHYDRAMINE HCL 25 MG PO CAPS
25.0000 mg | ORAL_CAPSULE | Freq: Four times a day (QID) | ORAL | Status: DC | PRN
  Administered 2015-08-09: 25 mg via ORAL
  Filled 2015-08-08: qty 1

## 2015-08-08 MED ORDER — LEVETIRACETAM 500 MG PO TABS
1000.0000 mg | ORAL_TABLET | Freq: Two times a day (BID) | ORAL | Status: DC
  Administered 2015-08-08 – 2015-08-14 (×12): 1000 mg via ORAL
  Filled 2015-08-08 (×13): qty 2

## 2015-08-08 MED ORDER — DEXTROSE-NACL 5-0.45 % IV SOLN
INTRAVENOUS | Status: DC
  Administered 2015-08-08: 22:00:00 via INTRAVENOUS

## 2015-08-08 MED ORDER — SODIUM CHLORIDE 0.9 % IV SOLN
1000.0000 mL | Freq: Once | INTRAVENOUS | Status: AC
  Administered 2015-08-08: 1000 mL via INTRAVENOUS

## 2015-08-08 MED ORDER — DEXTROSE 5 % IV SOLN
1.0000 g | Freq: Once | INTRAVENOUS | Status: AC
  Administered 2015-08-08: 1 g via INTRAVENOUS
  Filled 2015-08-08: qty 10

## 2015-08-08 MED ORDER — MORPHINE SULFATE (PF) 4 MG/ML IV SOLN: 4.0000 mg | Freq: Once | INTRAVENOUS | Status: DC

## 2015-08-08 MED ORDER — SODIUM CHLORIDE 0.9 % IJ SOLN: 9.0000 mL | INTRAMUSCULAR | Status: DC | PRN

## 2015-08-08 MED ORDER — HYDROXYZINE HCL 25 MG PO TABS
25.0000 mg | ORAL_TABLET | Freq: Once | ORAL | Status: AC
  Administered 2015-08-08: 25 mg via ORAL
  Filled 2015-08-08: qty 1

## 2015-08-08 MED ORDER — ZOLPIDEM TARTRATE 5 MG PO TABS: 5.0000 mg | ORAL_TABLET | Freq: Every evening | ORAL | Status: DC | PRN

## 2015-08-08 MED ORDER — ACETAMINOPHEN 325 MG PO TABS
650.0000 mg | ORAL_TABLET | Freq: Once | ORAL | Status: AC
  Administered 2015-08-08: 650 mg via ORAL
  Filled 2015-08-08: qty 2

## 2015-08-08 MED ORDER — SODIUM CHLORIDE 0.9 % IV SOLN
1000.0000 mL | INTRAVENOUS | Status: DC
  Administered 2015-08-08: 1000 mL via INTRAVENOUS

## 2015-08-08 MED ORDER — DEXTROSE 5 % IV SOLN
1.0000 g | INTRAVENOUS | Status: DC
  Filled 2015-08-08: qty 10

## 2015-08-08 MED ORDER — DIPHENHYDRAMINE HCL 50 MG/ML IJ SOLN
25.0000 mg | Freq: Once | INTRAMUSCULAR | Status: AC
  Administered 2015-08-08: 25 mg via INTRAVENOUS
  Filled 2015-08-08: qty 1

## 2015-08-08 MED ORDER — HYDROMORPHONE 1 MG/ML IV SOLN
INTRAVENOUS | Status: DC
  Administered 2015-08-08: 22:00:00 via INTRAVENOUS
  Administered 2015-08-09: 2.4 mg via INTRAVENOUS
  Administered 2015-08-09: 0.6 mg via INTRAVENOUS
  Administered 2015-08-09: 3 mg via INTRAVENOUS
  Administered 2015-08-09: 2 mg via INTRAVENOUS
  Administered 2015-08-09: 4.2 mg via INTRAVENOUS
  Administered 2015-08-09: 1.8 mg via INTRAVENOUS
  Filled 2015-08-08: qty 25

## 2015-08-08 MED ORDER — HYDROMORPHONE HCL 2 MG/ML IJ SOLN
2.0000 mg | Freq: Once | INTRAMUSCULAR | Status: AC
  Administered 2015-08-08: 2 mg via INTRAVENOUS
  Filled 2015-08-08: qty 1

## 2015-08-08 MED ORDER — LAMOTRIGINE 100 MG PO TABS
200.0000 mg | ORAL_TABLET | Freq: Two times a day (BID) | ORAL | Status: DC
  Administered 2015-08-08 – 2015-08-14 (×11): 200 mg via ORAL
  Filled 2015-08-08 (×7): qty 1
  Filled 2015-08-08: qty 2
  Filled 2015-08-08 (×8): qty 1
  Filled 2015-08-08: qty 2
  Filled 2015-08-08: qty 1

## 2015-08-08 MED ORDER — DEXTROSE-NACL 5-0.45 % IV SOLN
INTRAVENOUS | Status: DC
  Administered 2015-08-08 – 2015-08-13 (×7): via INTRAVENOUS

## 2015-08-08 MED ORDER — DEXTROSE 5 % IV SOLN
500.0000 mg | INTRAVENOUS | Status: DC
  Administered 2015-08-09 – 2015-08-12 (×4): 500 mg via INTRAVENOUS
  Filled 2015-08-08 (×6): qty 500

## 2015-08-08 MED ORDER — OXYCODONE HCL ER 15 MG PO T12A
30.0000 mg | EXTENDED_RELEASE_TABLET | Freq: Two times a day (BID) | ORAL | Status: DC
  Administered 2015-08-08 – 2015-08-14 (×12): 30 mg via ORAL
  Filled 2015-08-08 (×12): qty 2

## 2015-08-08 MED ORDER — ASPIRIN EC 81 MG PO TBEC
81.0000 mg | DELAYED_RELEASE_TABLET | Freq: Every day | ORAL | Status: DC
  Administered 2015-08-08 – 2015-08-14 (×7): 81 mg via ORAL
  Filled 2015-08-08 (×7): qty 1

## 2015-08-08 MED ORDER — PROMETHAZINE HCL 25 MG PO TABS
25.0000 mg | ORAL_TABLET | Freq: Three times a day (TID) | ORAL | Status: DC | PRN
  Administered 2015-08-08 – 2015-08-12 (×4): 25 mg via ORAL
  Filled 2015-08-08 (×4): qty 1

## 2015-08-08 MED ORDER — PROMETHAZINE HCL 25 MG/ML IJ SOLN
25.0000 mg | Freq: Once | INTRAMUSCULAR | Status: AC
  Administered 2015-08-08: 25 mg via INTRAVENOUS
  Filled 2015-08-08: qty 1

## 2015-08-08 MED ORDER — SENNOSIDES-DOCUSATE SODIUM 8.6-50 MG PO TABS
1.0000 | ORAL_TABLET | Freq: Two times a day (BID) | ORAL | Status: DC
  Administered 2015-08-08 – 2015-08-13 (×9): 1 via ORAL
  Filled 2015-08-08 (×12): qty 1

## 2015-08-08 MED ORDER — SODIUM CHLORIDE 0.9 % IV SOLN
25.0000 mg | INTRAVENOUS | Status: DC | PRN
  Administered 2015-08-08 – 2015-08-13 (×10): 25 mg via INTRAVENOUS
  Filled 2015-08-08 (×22): qty 0.5

## 2015-08-08 MED ORDER — DIPHENHYDRAMINE HCL 50 MG/ML IJ SOLN
50.0000 mg | Freq: Once | INTRAMUSCULAR | Status: AC
  Administered 2015-08-08: 50 mg via INTRAVENOUS
  Filled 2015-08-08: qty 1

## 2015-08-08 MED ORDER — SODIUM CHLORIDE 0.9 % IV SOLN
Freq: Once | INTRAVENOUS | Status: AC
  Administered 2015-08-09: via INTRAVENOUS

## 2015-08-08 MED ORDER — IOHEXOL 350 MG/ML SOLN
100.0000 mL | Freq: Once | INTRAVENOUS | Status: AC | PRN
  Administered 2015-08-08: 100 mL via INTRAVENOUS

## 2015-08-08 MED ORDER — POLYETHYLENE GLYCOL 3350 17 G PO PACK
17.0000 g | PACK | Freq: Every day | ORAL | Status: DC | PRN
  Filled 2015-08-08: qty 1

## 2015-08-08 MED ORDER — INFLUENZA VAC SPLIT QUAD 0.5 ML IM SUSY
0.5000 mL | PREFILLED_SYRINGE | INTRAMUSCULAR | Status: AC
  Administered 2015-08-12: 0.5 mL via INTRAMUSCULAR
  Filled 2015-08-08 (×3): qty 0.5

## 2015-08-08 MED ORDER — FOLIC ACID 1 MG PO TABS
1.0000 mg | ORAL_TABLET | Freq: Every day | ORAL | Status: DC
  Administered 2015-08-08 – 2015-08-14 (×7): 1 mg via ORAL
  Filled 2015-08-08 (×7): qty 1

## 2015-08-08 MED ORDER — DEXTROSE 5 % IV SOLN
500.0000 mg | Freq: Once | INTRAVENOUS | Status: AC
  Administered 2015-08-08: 500 mg via INTRAVENOUS
  Filled 2015-08-08: qty 500

## 2015-08-08 MED ORDER — SODIUM CHLORIDE 0.9 % IV SOLN: 1000.0000 mL | INTRAVENOUS | Status: DC

## 2015-08-08 NOTE — ED Notes (Signed)
Bed: WA16 Expected date:  Expected time:  Means of arrival:  Comments: Hold for triage 2 

## 2015-08-08 NOTE — ED Notes (Signed)
Patient transported to CT 

## 2015-08-08 NOTE — H&P (Signed)
Hospital Admission Note Date: 08/08/2015  Patient name: Debra Williamson Medical record number: [id number] Date of birth: [birth year] Age: 34 y.o. Gender: female PCP: [facility]  Attending physician: Altha Harm, MD  Chief Complaint: Pain in chest wall x 3 days  History of Present Illness:This is an opiate tolerant patient with a history of multiple CVA's and seizure disorder who presents with pain to the left chest wall. Pt states that pain occurred when she took a deep breath initially. However, that pain has intensified when taking a deep breath.  She tried to manage pain at home with oral pain medications but was unsuccessful. She denies any fever, chills, N/V/D, Seizure activity, HA or focal weakness. She is being admitted for acute chest syndrome and sickle cell crisis.  In the ED she had a CTA which was negative for PE but showed consolidation on the left consistent with pneumonia vs acute chest syndrome. Pt has received Rocephin and Azithromycin in the ED. She has also received 3 doses of IV Dilaudid and is still having pain.  Scheduled Meds: . [START ON 08/09/2015] Influenza vac split quadrivalent PF  0.5 mL Intramuscular Tomorrow-1000   Continuous Infusions: . sodium chloride 1,000 mL (08/08/15 1452)  . sodium chloride     PRN Meds:. Allergies: Ibuprofen; Zofran; Demerol; Fentanyl and related; Other; Codeine; and Hydrocodone Past Medical History  Diagnosis Date  . Sickle cell disease (HCC)   . Seizures (HCC)     one time incident, may have been r/t to a pain medication she was prescribed  . Anemia   . Blood transfusion without reported diagnosis   . Stroke [facility])    Past Surgical History  Procedure Laterality Date  . Cholecystectomy    . Cesarean section      x4  . Reverse tubal ligation    . Portacath placement Right w-3   Family History  Problem Relation Age of Onset  . HIV/AIDS Father    Social History   Social History  .  Marital Status: Married    Spouse Name: N/A  . Number of Children: N/A  . Years of Education: N/A   Occupational History  . Not on file.   Social History Main Topics  . Smoking status: Never Smoker   . Smokeless tobacco: Never Used  . Alcohol Use: Yes     Comment: occasionally  . Drug Use: No  . Sexual Activity: Yes   Other Topics Concern  . Not on file   Social History Narrative   Review of Systems: A comprehensive review of systems was negative except as noted in the HPI Physical Exam: No intake or output data in the 24 hours ending 08/08/15 1704 General: Alert, awake, oriented x3, in no acute distress.  HEENT: [place]/AT PEERL, EOMI Neck: Trachea midline,  no masses, no thyromegal,y no JVD, no carotid bruit OROPHARYNX:  Moist, No exudate/ erythema/lesions.  Heart: Regular rate and rhythm, without murmurs, rubs, gallops, PMI non-displaced, no heaves or thrills on palpation.  Lungs: Clear to auscultation, no wheezing or rhonchi noted. No increased vocal fremitus resonant to percussion  Abdomen: Soft, nontender, nondistended, positive bowel sounds, no masses no hepatosplenomegaly noted..  Neuro: No focal neurological deficits noted cranial nerves II through XII grossly intact. DTRs 2+ bilaterally upper and lower extremities. Strength 5 out of 5 in bilateral upper and lower extremities. Musculoskeletal: No warm swelling or erythema around joints, no spinal tenderness noted. Psychiatric: Patient alert and oriented x3, good insight and  cognition, good recent to remote recall. Lymph node survey: No cervical axillary or inguinal lymphadenopathy noted.  Lab results:  Recent Labs  08/08/15 1013  NA 139  K 3.4*  CL 107  CO2 23  GLUCOSE 86  BUN <5*  CREATININE 0.41*  CALCIUM 8.9    Recent Labs  08/08/15 1013  AST 45*  ALT 24  ALKPHOS 84  BILITOT 2.6*  PROT 7.6  ALBUMIN 4.0   No results for input(s): LIPASE, AMYLASE in the last 72 hours.  Recent Labs  08/08/15 1013   WBC 17.3*  NEUTROABS 13.2*  HGB 6.4*  HCT 18.7*  MCV 102.2*  PLT 472*    Recent Labs  08/08/15 1013  TROPONINI <0.03   Invalid input(s): POCBNP No results for input(s): DDIMER in the last 72 hours. No results for input(s): HGBA1C in the last 72 hours. No results for input(s): CHOL, HDL, LDLCALC, TRIG, CHOLHDL, LDLDIRECT in the last 72 hours. No results for input(s): TSH, T4TOTAL, T3FREE, THYROIDAB in the last 72 hours.  Invalid input(s): FREET3  Recent Labs  08/08/15 1013  RETICCTPCT >23.0*   Imaging results:  Dg Chest 2 View  08/08/2015  CLINICAL DATA:  Short of breath.  Sickle cell EXAM: CHEST  2 VIEW COMPARISON:  03/11/2015 FINDINGS: Hypoventilation with decreased lung volume. Bibasilar airspace disease has developed since prior study may represent pneumonia or atelectasis. No effusion. Port-A-Cath tip in the lower SVC. Heart size upper normal. Negative for heart failure. IMPRESSION: Patchy bibasilar atelectasis/infiltrate with decreased lung volume. Electronically Signed   By: Marlan Palauharles  Clark M.D.   On: 08/08/2015 10:12   Ct Angio Chest Pe W/cm &/or Wo Cm  08/08/2015  CLINICAL DATA:  Left-sided chest pain.  Sickle cell crisis. EXAM: CT ANGIOGRAPHY CHEST WITH CONTRAST TECHNIQUE: Multidetector CT imaging of the chest was performed using the standard protocol during bolus administration of intravenous contrast. Multiplanar CT image reconstructions and MIPs were obtained to evaluate the vascular anatomy. CONTRAST:  100mL OMNIPAQUE IOHEXOL 350 MG/ML SOLN COMPARISON:  None. FINDINGS: THORACIC INLET/BODY WALL: Right IJ porta catheter with tip at the upper cavoatrial junction. MEDIASTINUM: Borderline cardiomegaly. Negative aorta. CTA of the pulmonary arteries is limited by intermittent motion and bolus dispersion, but overall diagnostic and negative for pulmonary embolism. Mild hilar nodal enlargement, likely reactive to the pulmonary findings. LUNG WINDOWS: Bibasilar lung opacities  with mild volume loss. Opacification is more extensive and consolidative on the left, where there is also small pleural effusion. UPPER ABDOMEN: Small calcified spleen OSSEOUS: No acute fracture.  No suspicious lytic or blastic lesions. Review of the MIP images confirms the above findings. IMPRESSION: 1. Extensive bibasilar atelectasis with superimposed pneumonia/acute chest syndrome on the left. Small left pleural effusion. 2. No evidence of pulmonary embolism. Electronically Signed   By: Marnee SpringJonathon  Watts M.D.   On: 08/08/2015 14:30   Other results: EKG: prolonged QT interval.   Assessment and Plan: 1. Hb SS with acute chest syndrome: Will admit and continue on IV antibiotics. Will also transfuse RBC's to a target goal of 10. Will also check a Hb electrophoresis. 2. Community Acquired Pneumonia: Continue IV antibiotics with Azithromycin and Rocephin. 3. Hb SS with crisis: Pt will be treated with a dilaudid PCA, Pt is allergic to NSAID's so will hold on Toradol. Also continue hypotonic IVF.  4. Prolonged QTc: Pt takes Phenergan for nausea which can lead to prolonged QTc. However I will check electrolytes and correct any abnormalities. 5. Hypokalemia: Replete orally 6. Seizure disorder: Continue  Keppra,  Lamictal and ASA. 7. F/E/N: Pt ordered regular diet, D5.45 IVf and Senna-S for improved gut motility.  Time spent 60 minutes.  Fable Huisman A. 08/08/2015, 5:04 PM

## 2015-08-08 NOTE — ED Notes (Signed)
Patient transported to X-ray 

## 2015-08-08 NOTE — ED Notes (Signed)
Pt has a port and will gain samples from there

## 2015-08-08 NOTE — ED Notes (Signed)
[facility] Xray needed to confirm placement before accessed. Patient states to wait to draw blood until port placement is confirmed.

## 2015-08-08 NOTE — ED Notes (Signed)
MD at bedside. 

## 2015-08-08 NOTE — ED Provider Notes (Signed)
CSN: [id number]     Arrival date & time 08/08/15  4098 History   First MD Initiated Contact with Patient 08/08/15 [phone]     Chief Complaint  Patient presents with  . Sickle Cell Pain Crisis     (Consider location/radiation/quality/duration/timing/severity/associated sxs/prior Treatment) HPI patient poor she has chest pain that started yesterday. She reports the pain is in the left lower chest. It is worse with deep breath and movement. She reports she's had a small amount of coughing. No sputum production and no fever. She denies specific lower extremity swelling or calf tenderness. Patient has been taking home pain medications without relief. She reports she does feel short of breath in association with this.  Past Medical History  Diagnosis Date  . Sickle cell disease (HCC)   . Seizures (HCC)     one time incident, may have been r/t to a pain medication she was prescribed  . Anemia   . Blood transfusion without reported diagnosis   . Stroke [facility])    Past Surgical History  Procedure Laterality Date  . Cholecystectomy    . Cesarean section      x4  . Reverse tubal ligation    . Portacath placement Right w-3   Family History  Problem Relation Age of Onset  . HIV/AIDS Father    Social History  Substance Use Topics  . Smoking status: Never Smoker   . Smokeless tobacco: Never Used  . Alcohol Use: Yes     Comment: occasionally   OB History    No data available     Review of Systems 10 Systems reviewed and are negative for acute change except as noted in the HPI.    Allergies  Ibuprofen; Zofran; Demerol; Fentanyl and related; Other; Codeine; and Hydrocodone  Home Medications   Prior to Admission medications   Medication Sig Start Date End Date Taking? Authorizing Provider  aspirin EC 81 MG tablet Take 1 tablet by mouth daily. 11/03/13  Yes Historical Provider, MD  docusate sodium (COLACE) 100 MG capsule Take 1 capsule by mouth daily as needed for moderate  constipation.  03/09/14  Yes Historical Provider, MD  folic acid (FOLVITE) 1 MG tablet Take 1 mg by mouth daily.  02/13/15  Yes Historical Provider, MD  lamoTRIgine (LAMICTAL) 200 MG tablet Take 1 tablet by mouth 2 (two) times daily. 02/13/15  Yes Historical Provider, MD  levETIRAcetam (KEPPRA) 1000 MG tablet Take 1 tablet by mouth 2 (two) times daily. 02/13/15  Yes Historical Provider, MD  Oxycodone HCl 10 MG TABS Take 1 tablet by mouth every 4 (four) hours as needed (pain).  02/13/15  Yes Historical Provider, MD  OxyCODONE HCl ER 30 MG T12A Take 1 tablet by mouth every 12 (twelve) hours. 02/13/15  Yes Historical Provider, MD  promethazine (PHENERGAN) 25 MG tablet Take 1 tablet by mouth every 8 (eight) hours as needed. n/v 02/13/15  Yes Historical Provider, MD  zolpidem (AMBIEN) 5 MG tablet Take 1 tablet (5 mg total) by mouth at bedtime as needed. sleep 03/13/15  Yes Altha Harm, MD   BP 107/60 mmHg  Pulse 85  Temp(Src) 98.2 F (36.8 C) (Axillary)  Resp 25  Ht  (1.626 m)  Wt 165 lb 9.1 oz (75.1 kg)  BMI 28.41 kg/m2  SpO2 100%  LMP 07/25/2015 Physical Exam  Constitutional: She is oriented to person, place, and time. She appears well-developed and well-nourished.  Patient is well appearance. She is alert and nontoxic. She does have  tachypnea.  HENT:  Head: Normocephalic and atraumatic.  Mouth/Throat: Oropharynx is clear and moist.  Eyes: EOM are normal. Pupils are equal, round, and reactive to light.  Neck: Neck supple.  Cardiovascular: Normal rate, regular rhythm, normal heart sounds and intact distal pulses.   Pulmonary/Chest: Effort normal and breath sounds normal. She exhibits tenderness.  Patient versus tenderness to palpation of the left lower chest wall. No palpable abnormality.  Abdominal: Soft. Bowel sounds are normal. She exhibits no distension. There is no tenderness.  Musculoskeletal: Normal range of motion. She exhibits no edema or tenderness.  Neurological: She is  alert and oriented to person, place, and time. She has normal strength. Coordination normal. GCS eye subscore is 4. GCS verbal subscore is 5. GCS motor subscore is 6.  Skin: Skin is warm, dry and intact.  Psychiatric: She has a normal mood and affect.    ED Course  Procedures (including critical care time) Labs Review Labs Reviewed  COMPREHENSIVE METABOLIC PANEL - Abnormal; Notable for the following:    Potassium 3.4 (*)    BUN <5 (*)    Creatinine, Ser 0.41 (*)    AST 45 (*)    Total Bilirubin 2.6 (*)    All other components within normal limits  CBC WITH DIFFERENTIAL/PLATELET - Abnormal; Notable for the following:    WBC 17.3 (*)    RBC 1.83 (*)    Hemoglobin 6.4 (*)    HCT 18.7 (*)    MCV 102.2 (*)    MCH 35.0 (*)    RDW 24.1 (*)    Platelets 472 (*)    nRBC 5 (*)    Neutro Abs 13.2 (*)    All other components within normal limits  RETICULOCYTES - Abnormal; Notable for the following:    Retic Ct Pct >23.0 (*)    RBC. 1.83 (*)    All other components within normal limits  LACTATE DEHYDROGENASE - Abnormal; Notable for the following:    LDH 380 (*)    All other components within normal limits  BASIC METABOLIC PANEL - Abnormal; Notable for the following:    Glucose, Bld 104 (*)    Calcium 8.1 (*)    All other components within normal limits  CBC - Abnormal; Notable for the following:    WBC 17.6 (*)    RBC 2.49 (*)    Hemoglobin 8.1 (*)    HCT 23.0 (*)    RDW 20.0 (*)    Platelets 454 (*)    All other components within normal limits  BLOOD GAS, ARTERIAL - Abnormal; Notable for the following:    pCO2 arterial 45.4 (*)    pO2, Arterial 69.5 (*)    Bicarbonate 24.4 (*)    All other components within normal limits  DIFFERENTIAL - Abnormal; Notable for the following:    Neutro Abs 12.2 (*)    Monocytes Absolute 2.2 (*)    All other components within normal limits  HEPATIC FUNCTION PANEL - Abnormal; Notable for the following:    Total Bilirubin 2.2 (*)    Indirect  Bilirubin 1.7 (*)    All other components within normal limits  BLOOD GAS, ARTERIAL - Abnormal; Notable for the following:    Bicarbonate 24.5 (*)    All other components within normal limits  CULTURE, BLOOD (ROUTINE X 2)  CULTURE, BLOOD (ROUTINE X 2)  MRSA PCR SCREENING  CULTURE, EXPECTORATED SPUTUM-ASSESSMENT  GRAM STAIN  LEGIONELLA CULTURE  TROPONIN I  MAGNESIUM  HIV ANTIBODY (ROUTINE  TESTING)  STREP PNEUMONIAE URINARY ANTIGEN  LACTIC ACID, PLASMA  PROCALCITONIN  LEGIONELLA PNEUMOPHILA SEROGP 1 UR AG  HEMOGLOBINOPATHY EVALUATION  CBC WITH DIFFERENTIAL/PLATELET  COMPREHENSIVE METABOLIC PANEL  TYPE AND SCREEN  PREPARE RBC (CROSSMATCH)  PREPARE RBC (CROSSMATCH)    Imaging Review Dg Chest 2 View  08/08/2015  CLINICAL DATA:  Short of breath.  Sickle cell EXAM: CHEST  2 VIEW COMPARISON:  03/11/2015 FINDINGS: Hypoventilation with decreased lung volume. Bibasilar airspace disease has developed since prior study may represent pneumonia or atelectasis. No effusion. Port-A-Cath tip in the lower SVC. Heart size upper normal. Negative for heart failure. IMPRESSION: Patchy bibasilar atelectasis/infiltrate with decreased lung volume. Electronically Signed   By: Marlan Palau M.D.   On: 08/08/2015 10:12   Ct Angio Chest Pe W/cm &/or Wo Cm  08/08/2015  CLINICAL DATA:  Left-sided chest pain.  Sickle cell crisis. EXAM: CT ANGIOGRAPHY CHEST WITH CONTRAST TECHNIQUE: Multidetector CT imaging of the chest was performed using the standard protocol during bolus administration of intravenous contrast. Multiplanar CT image reconstructions and MIPs were obtained to evaluate the vascular anatomy. CONTRAST:  OMNIPAQUE IOHEXOL 350 MG/ML SOLN COMPARISON:  None. FINDINGS: THORACIC INLET/BODY WALL: Right IJ porta catheter with tip at the upper cavoatrial junction. MEDIASTINUM: Borderline cardiomegaly. Negative aorta. CTA of the pulmonary arteries is limited by intermittent motion and bolus dispersion, but  overall diagnostic and negative for pulmonary embolism. Mild hilar nodal enlargement, likely reactive to the pulmonary findings. LUNG WINDOWS: Bibasilar lung opacities with mild volume loss. Opacification is more extensive and consolidative on the left, where there is also small pleural effusion. UPPER ABDOMEN: Small calcified spleen OSSEOUS: No acute fracture.  No suspicious lytic or blastic lesions. Review of the MIP images confirms the above findings. IMPRESSION: 1. Extensive bibasilar atelectasis with superimposed pneumonia/acute chest syndrome on the left. Small left pleural effusion. 2. No evidence of pulmonary embolism. Electronically Signed   By: Marnee Spring M.D.   On: 08/08/2015 14:30   Dg Chest Port 1 View  08/10/2015  CLINICAL DATA:  Shortness of breath.  Pneumonia. EXAM: PORTABLE CHEST 1 VIEW COMPARISON:  08/09/2015. FINDINGS: New Port-A-Cath in stable position. Heart size stable. Diffuse bilateral airspace disease again noted, no interim change. Small pleural effusions cannot be excluded. No pneumothorax . IMPRESSION: 1. Port-A-Cath in stable position. 2. Diffuse dense bilateral airspace disease, no interim change. Small pleural effusions cannot be excluded . Electronically Signed   By: Maisie Fus  Register   On: 08/10/2015 07:15   Dg Chest Port 1 View  08/09/2015  CLINICAL DATA:  Acute respiratory distress, shortness of breath, sickle cell. EXAM: PORTABLE CHEST 1 VIEW COMPARISON:  08/09/2015. FINDINGS: Trachea is midline. Heart size is grossly stable. There is patchy consolidation at both lung bases, slightly progressive from earlier today. Possible bilateral pleural effusions. Right IJ Port-A-Cath terminates at the low SVC or SVC RA junction. IMPRESSION: Slight worsening in mid and lower lung zone bilateral airspace consolidation. Possible bilateral effusions. Electronically Signed   By: Leanna Battles M.D.   On: 08/09/2015 15:52   Dg Chest Port 1 View  08/09/2015  CLINICAL DATA:  Sickle  cell anemia.  Shortness of breath EXAM: PORTABLE CHEST 1 VIEW COMPARISON:  08/08/2015 FINDINGS: Right double lumen Port-A-Cath remains in place, unchanged. Cardiomegaly. Worsening bilateral airspace opacities, left greater than right with probable left effusion. Findings could reflect asymmetric edema or infection. No acute bony abnormality. IMPRESSION: Bilateral perihilar and lower lobe opacities, left greater than right which could reflect asymmetric  edema or infection. Cardiomegaly.  Small left effusion. Electronically Signed   By: Charlett Nose M.D.   On: 08/09/2015 09:21   I have personally reviewed and evaluated these images and lab results as part of my medical decision-making.   EKG Interpretation   Date/Time:  Tuesday August 08 2015 09:37:18 EDT Ventricular Rate:  92 PR Interval:  211 QRS Duration: 82 QT Interval:  377 QTC Calculation: 466 R Axis:   47 Text Interpretation:  Sinus rhythm Prolonged PR interval Abnormal R-wave  progression, early transition NO STEMI NO SIG CHANGE FROM OLD Confirmed by  Donnald Garre, MD, Lebron Conners [phone]) on 08/08/2015 12:01:07 PM      MDM   Final diagnoses:  Sickle cell crisis (HCC)  Acute chest syndrome (HCC)   Patient's CT shows some consolidation in the lower left chest suggestive of either pneumonia or acute chest syndrome. No PE is identified. Patient has been started on IV antibiotics, fluids, pain medications and oxygen. She has remained clinically stable. The patient will be admitted for acute chest syndrome with sickle cell anemia.    Arby Barrette, MD 08/10/15 [phone]

## 2015-08-08 NOTE — ED Notes (Signed)
Patient requesting Benadryl for itching. MD made aware. MD states patient is maxed out for Benadryl

## 2015-08-08 NOTE — ED Notes (Addendum)
Patient stated she does not want to wait for chest xray result and wants RN to attempt IV. IV not successful, but was able to draw some of the blood

## 2015-08-08 NOTE — ED Notes (Signed)
Per pt, states right flank pain, and states difficulty breathing since this am

## 2015-08-08 NOTE — ED Notes (Addendum)
CT states that need IV access before can do CT angio, due to pt's port not being a power port.  Matt RN made aware.  Attempting IV access with ultrasound.

## 2015-08-09 ENCOUNTER — Inpatient Hospital Stay (HOSPITAL_COMMUNITY): Payer: Medicaid Other

## 2015-08-09 DIAGNOSIS — J9622 Acute and chronic respiratory failure with hypercapnia: Secondary | ICD-10-CM

## 2015-08-09 DIAGNOSIS — J9621 Acute and chronic respiratory failure with hypoxia: Secondary | ICD-10-CM

## 2015-08-09 DIAGNOSIS — I4581 Long QT syndrome: Secondary | ICD-10-CM

## 2015-08-09 LAB — BLOOD GAS, ARTERIAL
Acid-Base Excess: 0.3 mmol/L (ref 0.0–2.0)
Acid-base deficit: 0.7 mmol/L (ref 0.0–2.0)
Bicarbonate: 24.4 mEq/L — ABNORMAL HIGH (ref 20.0–24.0)
Bicarbonate: 24.5 mEq/L — ABNORMAL HIGH (ref 20.0–24.0)
Delivery systems: POSITIVE
Drawn by: 295031
Drawn by: 257701
Expiratory PAP: 6
FIO2: 0.55
FIO2: 0.8
Inspiratory PAP: 12
Mode: POSITIVE
O2 Saturation: 83.7 %
O2 Saturation: 96.1 %
Patient temperature: 98.6
Patient temperature: 98.6
TCO2: 23.5 mmol/L (ref 0–100)
TCO2: 23.2 mmol/L (ref 0–100)
pCO2 arterial: 45.4 mmHg — ABNORMAL HIGH (ref 35.0–45.0)
pCO2 arterial: 39.8 mmHg (ref 35.0–45.0)
pH, Arterial: 7.35 (ref 7.350–7.450)
pH, Arterial: 7.406 (ref 7.350–7.450)
pO2, Arterial: 69.5 mmHg — ABNORMAL LOW (ref 80.0–100.0)
pO2, Arterial: 97.2 mmHg (ref 80.0–100.0)

## 2015-08-09 LAB — CBC
HCT: 23 % — ABNORMAL LOW (ref 36.0–46.0)
Hemoglobin: 8.1 g/dL — ABNORMAL LOW (ref 12.0–15.0)
MCH: 32.5 pg (ref 26.0–34.0)
MCHC: 35.2 g/dL (ref 30.0–36.0)
MCV: 92.4 fL (ref 78.0–100.0)
Platelets: 454 10*3/uL — ABNORMAL HIGH (ref 150–400)
RBC: 2.49 MIL/uL — ABNORMAL LOW (ref 3.87–5.11)
RDW: 20 % — ABNORMAL HIGH (ref 11.5–15.5)
WBC: 17.6 10*3/uL — ABNORMAL HIGH (ref 4.0–10.5)

## 2015-08-09 LAB — HEPATIC FUNCTION PANEL
ALT: 19 U/L (ref 14–54)
AST: 28 U/L (ref 15–41)
Albumin: 3.5 g/dL (ref 3.5–5.0)
Alkaline Phosphatase: 74 U/L (ref 38–126)
Bilirubin, Direct: 0.5 mg/dL (ref 0.1–0.5)
Indirect Bilirubin: 1.7 mg/dL — ABNORMAL HIGH (ref 0.3–0.9)
Total Bilirubin: 2.2 mg/dL — ABNORMAL HIGH (ref 0.3–1.2)
Total Protein: 6.9 g/dL (ref 6.5–8.1)

## 2015-08-09 LAB — DIFFERENTIAL
Basophils Absolute: 0 10*3/uL (ref 0.0–0.1)
Basophils Relative: 0 %
Eosinophils Absolute: 0.2 10*3/uL (ref 0.0–0.7)
Eosinophils Relative: 1 %
Lymphocytes Relative: 15 %
Lymphs Abs: 2.6 10*3/uL (ref 0.7–4.0)
Monocytes Absolute: 2.2 10*3/uL — ABNORMAL HIGH (ref 0.1–1.0)
Monocytes Relative: 13 %
Neutro Abs: 12.2 10*3/uL — ABNORMAL HIGH (ref 1.7–7.7)
Neutrophils Relative %: 71 %

## 2015-08-09 LAB — PROCALCITONIN: Procalcitonin: <0.1 ng/mL

## 2015-08-09 LAB — BASIC METABOLIC PANEL
Anion gap: 5 (ref 5–15)
BUN: 6 mg/dL (ref 6–20)
CO2: 27 mmol/L (ref 22–32)
Calcium: 8.1 mg/dL — ABNORMAL LOW (ref 8.9–10.3)
Chloride: 105 mmol/L (ref 101–111)
Creatinine, Ser: 0.45 mg/dL (ref 0.44–1.00)
GFR calc Af Amer: >60 mL/min (ref >60)
GFR calc non Af Amer: >60 mL/min (ref >60)
Glucose, Bld: 104 mg/dL — ABNORMAL HIGH (ref 65–99)
Potassium: 3.5 mmol/L (ref 3.5–5.1)
Sodium: 137 mmol/L (ref 135–145)

## 2015-08-09 LAB — STREP PNEUMONIAE URINARY ANTIGEN: Strep Pneumo Urinary Antigen: NEGATIVE

## 2015-08-09 LAB — MRSA PCR SCREENING: MRSA by PCR: NEGATIVE

## 2015-08-09 LAB — LACTIC ACID, PLASMA: Lactic Acid, Venous: 1 mmol/L (ref 0.5–2.0)

## 2015-08-09 LAB — PREPARE RBC (CROSSMATCH)

## 2015-08-09 LAB — HIV ANTIBODY (ROUTINE TESTING W REFLEX): HIV Screen 4th Generation wRfx: NONREACTIVE

## 2015-08-09 MED ORDER — VANCOMYCIN HCL IN DEXTROSE 750-5 MG/150ML-% IV SOLN
750.0000 mg | Freq: Three times a day (TID) | INTRAVENOUS | Status: DC
  Administered 2015-08-09 – 2015-08-10 (×4): 750 mg via INTRAVENOUS
  Filled 2015-08-09 (×5): qty 150

## 2015-08-09 MED ORDER — LEVALBUTEROL HCL 1.25 MG/0.5ML IN NEBU
1.2500 mg | INHALATION_SOLUTION | Freq: Once | RESPIRATORY_TRACT | Status: AC
  Administered 2015-08-09: 1.25 mg via RESPIRATORY_TRACT
  Filled 2015-08-09: qty 0.5

## 2015-08-09 MED ORDER — ACETAMINOPHEN 325 MG PO TABS
650.0000 mg | ORAL_TABLET | Freq: Once | ORAL | Status: AC
  Administered 2015-08-09: 650 mg via ORAL
  Filled 2015-08-09: qty 2

## 2015-08-09 MED ORDER — PIPERACILLIN-TAZOBACTAM 3.375 G IVPB
3.3750 g | Freq: Three times a day (TID) | INTRAVENOUS | Status: DC
  Administered 2015-08-09 – 2015-08-11 (×7): 3.375 g via INTRAVENOUS
  Filled 2015-08-09 (×7): qty 50

## 2015-08-09 MED ORDER — DM-GUAIFENESIN ER 30-600 MG PO TB12
2.0000 | ORAL_TABLET | Freq: Two times a day (BID) | ORAL | Status: DC
  Filled 2015-08-09 (×2): qty 2

## 2015-08-09 MED ORDER — SODIUM CHLORIDE 0.9 % IV SOLN
Freq: Once | INTRAVENOUS | Status: AC
  Administered 2015-08-09: 03:00:00 via INTRAVENOUS

## 2015-08-09 MED ORDER — HYDROMORPHONE 1 MG/ML IV SOLN
INTRAVENOUS | Status: DC
  Administered 2015-08-10: 1.2 mg via INTRAVENOUS
  Administered 2015-08-10: 1.8 mg via INTRAVENOUS
  Administered 2015-08-10: 1.2 mg via INTRAVENOUS

## 2015-08-09 MED ORDER — POTASSIUM CHLORIDE CRYS ER 20 MEQ PO TBCR
40.0000 meq | EXTENDED_RELEASE_TABLET | ORAL | Status: AC
  Administered 2015-08-09 (×2): 40 meq via ORAL
  Filled 2015-08-09: qty 2

## 2015-08-09 MED ORDER — DOCUSATE SODIUM 100 MG PO CAPS
100.0000 mg | ORAL_CAPSULE | Freq: Every day | ORAL | Status: DC | PRN
  Filled 2015-08-09: qty 1

## 2015-08-09 NOTE — Progress Notes (Signed)
PT is off BiPAP at this time. Does not appear to be in respiratory distress at this time.

## 2015-08-09 NOTE — Consult Note (Signed)
Name: Debra Williamson MRN: [id number] DOB: 1980-12-09    ADMISSION DATE:  08/08/2015 CONSULTATION DATE:  10/19  REFERRING MD :  Dr. Ashley Royalty   CHIEF COMPLAINT:  SOB  BRIEF PATIENT DESCRIPTION: 34 y/o F with PMH of sickle cell, seizures and prior CVA who presented to [facility] on 10/18 with complaints of left sided chest pain with inspiration.  CTA of the Chest was negative for PE but demonstrated concern for CAP.  The patient developed worsening hypoxia and transferred to ICU on 10/19 and placed on bipap.   PCCM consulted 10/19 for evaluation.  SIGNIFICANT EVENTS  10/18  Admit with CAP, possible acute chest syndrome, anemia s/p transfusion  STUDIES:  10/19  CTA Chest >> extensive bibasilar airspace disease, air bronchograms noted, small left pleural effusion, neg for PE   HISTORY OF PRESENT ILLNESS:  34 y/o F with PMH of sickle cell, seizures and prior CVA who presented to [facility] on 10/18 with complaints of left sided chest pain with inspiration.   The patient reports she began having pain 10/17 "under her left ribs" on Monday but went about usual activities.  She noted it was worse with inspiration.  On 10/18 she woke with difficulty breathing.  She recently had a port-a-cath placed approximately 3 weeks prior to admit at [facility].  The patient attempted to control pain with home regimen but was unsuccessful.  She reports one of her daughters had a cough over the past week.  She denies fevers or chills, nausea, vomiting, diarrhea, cough, and sputum production. Labs notable for WBC 17.3, Hgb 6.4, Hct 18.7, MCV 102.2, platelets 472,  Na 139, K 3.4, sr cr 0.41, LDH 380 and negative troponin.   The patient was admitted per Dr. Ashley Royalty for CAP, sickle cell crisis and concern for acute chest syndrome.  She was treated with IV dilaudid for pain control and antibiotics for CAP coverage.  Lactic acid assessed and negative.  Overnight, the patient was noted to have desaturations to 82% on 3L/[place].  Deep  breathing/cough and sats returned to 96-97%.  Approximately one hour after the O2 was increased, the patients saturations dropped a second time into the 70's and she was placed on a 50% VM.  ABG assessed and showed 7.35 / 45.4 / 69.5 / 24.4.  The patient was transferred to ICU for bipap therapy.  Repeat CXR concerning for worsening of LLL opacities.  PCCM consulted for evaluation.    PAST MEDICAL HISTORY :   has a past medical history of Sickle cell disease (HCC); Seizures (HCC); Anemia; Blood transfusion without reported diagnosis; and Stroke (HCC).  has past surgical history that includes Cholecystectomy; Cesarean section; reverse tubal ligation; and Portacath placement (Right, w-3).   Prior to Admission medications   Medication Sig Start Date End Date Taking? Authorizing Provider  aspirin EC 81 MG tablet Take 1 tablet by mouth daily. 11/03/13  Yes Historical Provider, MD  docusate sodium (COLACE) 100 MG capsule Take 1 capsule by mouth daily as needed for moderate constipation.  03/09/14  Yes Historical Provider, MD  folic acid (FOLVITE) 1 MG tablet Take 1 mg by mouth daily.  02/13/15  Yes Historical Provider, MD  lamoTRIgine (LAMICTAL) 200 MG tablet Take 1 tablet by mouth 2 (two) times daily. 02/13/15  Yes Historical Provider, MD  levETIRAcetam (KEPPRA) 1000 MG tablet Take 1 tablet by mouth 2 (two) times daily. 02/13/15  Yes Historical Provider, MD  Oxycodone HCl 10 MG TABS Take 1 tablet by mouth every 4 (four)  hours as needed (pain).  02/13/15  Yes Historical Provider, MD  OxyCODONE HCl ER 30 MG T12A Take 1 tablet by mouth every 12 (twelve) hours. 02/13/15  Yes Historical Provider, MD  promethazine (PHENERGAN) 25 MG tablet Take 1 tablet by mouth every 8 (eight) hours as needed. n/v 02/13/15  Yes Historical Provider, MD  zolpidem (AMBIEN) 5 MG tablet Take 1 tablet (5 mg total) by mouth at bedtime as needed. sleep 03/13/15  Yes Altha Harm, MD   Allergies  Allergen Reactions  . Ibuprofen Hives    . Zofran [Ondansetron Hcl] Hives and Itching  . Demerol [Meperidine] Other (See Comments)    Pt has seizures  . Fentanyl And Related Nausea And Vomiting and Other (See Comments)    Very sedated  **Not allergic to the injection**  . Other Itching    Greek Yogurt  . Codeine Hives and Itching  . Hydrocodone Hives and Itching    FAMILY HISTORY:  family history includes HIV/AIDS in her father.   SOCIAL HISTORY:  reports that she has never smoked. She has never used smokeless tobacco. She reports that she drinks alcohol. She reports that she does not use illicit drugs.  REVIEW OF SYSTEMS:   Constitutional: Negative for fever, chills, weight loss, malaise/fatigue and diaphoresis.  HENT: Negative for hearing loss, ear pain, nosebleeds, congestion, sore throat, neck pain, tinnitus and ear discharge.   Eyes: Negative for blurred vision, double vision, photophobia, pain, discharge and redness.  Respiratory: Negative for cough, hemoptysis, sputum production,  wheezing and stridor. Reports shortness of breath and left sided pleuritic chest pain.    Cardiovascular: Negative for palpitations, orthopnea, claudication, leg swelling and PND.  Gastrointestinal: Negative for heartburn, nausea, vomiting, abdominal pain, diarrhea, constipation, blood in stool and melena.  Genitourinary: Negative for dysuria, urgency, frequency, hematuria and flank pain.  Musculoskeletal: Negative for myalgias, back pain, joint pain and falls.  Skin: Negative for itching and rash.  Neurological: Negative for dizziness, tingling, tremors, sensory change, speech change, focal weakness, seizures, loss of consciousness, weakness and headaches.  Endo/Heme/Allergies: Negative for environmental allergies and polydipsia. Does not bruise/bleed easily.  SUBJECTIVE: Currently, patient denies pain, SOB.  States she is comfortable on BiPAP.   VITAL SIGNS: Temp:  [97.4 F (36.3 C)-100.5 F (38.1 C)] 100.5 F (38.1 C) (10/19  1200) Pulse Rate:  [80-116] 92 (10/19 1207) Resp:  [13-29] 20 (10/19 1207) BP: (105-138)/(52-97) 114/70 mmHg (10/19 1207) SpO2:  [77 %-100 %] 97 % (10/19 1207) FiO2 (%):  [50 %-80 %] 60 % (10/19 1152) Weight:  [153 lb 3.2 oz (69.491 kg)-165 lb 9.1 oz (75.1 kg)] 165 lb 9.1 oz (75.1 kg) (10/19 0840)  PHYSICAL EXAMINATION: General:  wdwn adult female in NAD on bipap  Neuro:  AAOx4, speech clear, MAE HEENT:  MM pink/dry on bipap, no jvd Cardiovascular:  s1s2 rrr, ? SEM noted Lungs:  resp's even/non-labored, lungs bilaterally diminished with faint basilar crackles  Abdomen:  NTND, bsx4 active  Musculoskeletal:  No acute deformities  Skin:  Warm/dry, no edema    Recent Labs Lab 08/08/15 1013 08/09/15 0745  NA 139 137  K 3.4* 3.5  CL 107 105  CO2 23 27  BUN <5* 6  CREATININE 0.41* 0.45  GLUCOSE 86 104*    Recent Labs Lab 08/08/15 1013 08/09/15 0745  HGB 6.4* 8.1*  HCT 18.7* 23.0*  WBC 17.3* 17.6*  PLT 472* 454*   Dg Chest 2 View  08/08/2015  CLINICAL DATA:  Short of breath.  Sickle cell EXAM: CHEST  2 VIEW COMPARISON:  03/11/2015 FINDINGS: Hypoventilation with decreased lung volume. Bibasilar airspace disease has developed since prior study may represent pneumonia or atelectasis. No effusion. Port-A-Cath tip in the lower SVC. Heart size upper normal. Negative for heart failure. IMPRESSION: Patchy bibasilar atelectasis/infiltrate with decreased lung volume. Electronically Signed   By: Marlan Palauharles  Clark M.D.   On: 08/08/2015 10:12   Ct Angio Chest Pe W/cm &/or Wo Cm  08/08/2015  CLINICAL DATA:  Left-sided chest pain.  Sickle cell crisis. EXAM: CT ANGIOGRAPHY CHEST WITH CONTRAST TECHNIQUE: Multidetector CT imaging of the chest was performed using the standard protocol during bolus administration of intravenous contrast. Multiplanar CT image reconstructions and MIPs were obtained to evaluate the vascular anatomy. CONTRAST:  100mL OMNIPAQUE IOHEXOL 350 MG/ML SOLN COMPARISON:  None.  FINDINGS: THORACIC INLET/BODY WALL: Right IJ porta catheter with tip at the upper cavoatrial junction. MEDIASTINUM: Borderline cardiomegaly. Negative aorta. CTA of the pulmonary arteries is limited by intermittent motion and bolus dispersion, but overall diagnostic and negative for pulmonary embolism. Mild hilar nodal enlargement, likely reactive to the pulmonary findings. LUNG WINDOWS: Bibasilar lung opacities with mild volume loss. Opacification is more extensive and consolidative on the left, where there is also small pleural effusion. UPPER ABDOMEN: Small calcified spleen OSSEOUS: No acute fracture.  No suspicious lytic or blastic lesions. Review of the MIP images confirms the above findings. IMPRESSION: 1. Extensive bibasilar atelectasis with superimposed pneumonia/acute chest syndrome on the left. Small left pleural effusion. 2. No evidence of pulmonary embolism. Electronically Signed   By: Marnee SpringJonathon  Watts M.D.   On: 08/08/2015 14:30   Dg Chest Port 1 View  08/09/2015  CLINICAL DATA:  Sickle cell anemia.  Shortness of breath EXAM: PORTABLE CHEST 1 VIEW COMPARISON:  08/08/2015 FINDINGS: Right double lumen Port-A-Cath remains in place, unchanged. Cardiomegaly. Worsening bilateral airspace opacities, left greater than right with probable left effusion. Findings could reflect asymmetric edema or infection. No acute bony abnormality. IMPRESSION: Bilateral perihilar and lower lobe opacities, left greater than right which could reflect asymmetric edema or infection. Cardiomegaly.  Small left effusion. Electronically Signed   By: Charlett NoseKevin  Dover M.D.   On: 08/09/2015 09:21    ASSESSMENT / PLAN:   CAP - left greater than right bilateral lower airspace opacities with air bronchograms, small left effusion.  Urine strep antigen negative.    Acute Hypercarbic / Hypoxic Respiratory Failure  Left Chest Pain - denies pain currently, pleuritic pain associated with CAP vs acute chest syndrome Small Left Pleural  Effusion   Plan: PRN BiPAP for increased WOB Follow up ABG reviewed, Ok for break from bipap Push pulmonary hygiene - IS, mobilize as able  Trend CXR  No indication for intubation at present, monitor respiratory status closely  Vanco 10/19 >>  Zosyn 10/19 >>  Azithro 10/19 >>  Trend PCT / narrow abx as able    SIRS - lactic acid reassuring, VSS  Plan: Trend PCT  Hold further lactic acid    Sickle Cell Anemia -s/p transfusion, Hgb 6.4 > 8.1 Pain - secondary to above   Plan: Defer transfusions to Dr. Ashley RoyaltyMatthews Pain control: dilaudid PCA  Home regimen - oxycodone 10mg  Q4 PRN, oxycodone XR 30 mg Q12   Seizure Disorder   Plan: Lamictal / Keppra per primary     Canary BrimBrandi Ollis, NP-C [facility] Pgr: [phone] or if no answer [phone] 08/09/2015, 12:24 PM

## 2015-08-09 NOTE — Progress Notes (Signed)
ANTIBIOTIC CONSULT NOTE - INITIAL  Pharmacy Consult for vancomycin and zosyn Indication: pneumonia  Allergies  Allergen Reactions  . Ibuprofen Hives  . Zofran [Ondansetron Hcl] Hives and Itching  . Demerol [Meperidine] Other (See Comments)    Pt has seizures  . Fentanyl And Related Nausea And Vomiting and Other (See Comments)    Very sedated  **Not allergic to the injection**  . Other Itching    Greek Yogurt  . Codeine Hives and Itching  . Hydrocodone Hives and Itching    Patient Measurements: Height: 5\' 4"  (162.6 cm) Weight: 165 lb 9.1 oz (75.1 kg) IBW/kg (Calculated) : 54.7   Vital Signs: Temp: 97.4 F (36.3 C) (10/19 0502) Temp Source: Oral (10/19 0502) BP: 126/97 mmHg (10/19 0836) Pulse Rate: 101 (10/19 0836) Intake/Output from previous day: 10/18 0701 - 10/19 0700 In: 700 [Blood:700] Out: 600 [Urine:600] Intake/Output from this shift: Total I/O In: -  Out: 300 [Urine:300]  Labs:  Recent Labs  08/08/15 1013 08/09/15 0745  WBC 17.3* 17.6*  HGB 6.4* 8.1*  PLT 472* 454*  CREATININE 0.41* 0.45   Estimated Creatinine Clearance: 96.5 mL/min (by C-G formula based on Cr of 0.45). No results for input(s): VANCOTROUGH, VANCOPEAK, VANCORANDOM, GENTTROUGH, GENTPEAK, GENTRANDOM, TOBRATROUGH, TOBRAPEAK, TOBRARND, AMIKACINPEAK, AMIKACINTROU, AMIKACIN in the last 72 hours.   Microbiology: No results found for this or any previous visit (from the past 720 hour(s)).  Medical History: Past Medical History  Diagnosis Date  . Sickle cell disease (HCC)   . Seizures (HCC)     one time incident, may have been r/t to a pain medication she was prescribed  . Anemia   . Blood transfusion without reported diagnosis   . Stroke [facility](HCC)     Medications:  Scheduled:  . aspirin EC  81 mg Oral Daily  . azithromycin  500 mg Intravenous Q24H  . dextromethorphan-guaiFENesin  2 tablet Oral BID  . enoxaparin (LOVENOX) injection  40 mg Subcutaneous QHS  . folic acid  1 mg Oral  Daily  . HYDROmorphone   Intravenous 6 times per day  . Influenza vac split quadrivalent PF  0.5 mL Intramuscular Tomorrow-1000  . lamoTRIgine  200 mg Oral BID  . levETIRAcetam  1,000 mg Oral BID  . OxyCODONE  30 mg Oral Q12H  . potassium chloride  40 mEq Oral Q2H  . senna-docusate  1 tablet Oral BID   Assessment: 34 y.o. Female pt with a history of sickle cell disease, hx of multiple CVA's and seizure disorder who presents with left chest wall pain.  In the ED she had a CTA which was negative for PE but showed consolidation on the left consistent with pneumonia vs acute chest syndrome. Pt has received Rocephin and Azithromycin in the ED.  Pt had increased O2 requirements-vanc and zosyn started per pharmacy dosing.  Goal of Therapy:  Vancomycin trough level 15-20 mcg/ml  Plan:  Vancomycin 750mg  IV q8h Zosyn 3.375g IV Q8H infused over 4hrs. Follow renal function, cultures, clinical course vanc trough at steady state  Arley Phenixllen Jama Krichbaum RPh 08/09/2015, 9:10 AM Pager [phone]

## 2015-08-09 NOTE — Progress Notes (Signed)
RT removed PT from BiPAP per CCM- placed on Stella vis C02 detector line - Sp02 <85%. RT placed on VM 55%= Sp02 95%.

## 2015-08-09 NOTE — Progress Notes (Signed)
SICKLE CELL SERVICE PROGRESS NOTE  Debra Williamson ZOX:[id number] DOB: 26-May-1981 DOA: 08/08/2015 PCP: [facility]  Assessment/Plan: Active Problems:   Sickle cell crisis (HCC)   Community acquired pneumonia  1. Acute Hypoxic Respiratory failure: Called by nurse this morning for increased Oxygen requirements. ABG shows hypoxemia and hypercarbia. Started on BiPAP (12/6). Consult critical care and obtain CXR.  I suspect that this is al due to her acute chest syndrome. She has used very little opiate on the PCA and is essentially on the dose that she uses daily. However will monitor her progress and re-assess dose of Oxycontin. 2. Acute Chest Syndrome: Pt has been transfused up to goal of 8 g/dl. Will obtain hemoglobin electrophoresis and continue supportive care. 3. CAP: She was started on Rocephin and Azithromycin yesterday however given her clinical decline will broaden spectrum to Azithromycin,  Vancomycin, Zosyn. Check lactic acid and obtain CXR.  4.  Hb SS with crisis: Pt reports that her chest pain has decreasaed considerably since yesterday. She is on her usual Oxycontin but has used only  8.5 mg of dilaudid in the last 12 hours. I do not believe that opiates are a significant contributor to her current condition. 5. Suspect Sepsis: Pt exhibits sepsis physiology. Will check lactic acid and institute sepsis protocol as indicated. 6. Anemia of Chronic Disease: Pt reported a base line of 7.8 g/dl during her last admission. She was at 6.4 on admission and is 8.1 after transfusion of 2 units of RBC's. Her lactic acid is only mildly elevated as compared to her last admission and her baseline is unknown. 7. Hypokalemia: Improved today. Will continue supplementation. 8. Prolonged QTc: Her EKG yesterday exhibited a prolonged QTc. Her electrolytes are normal this morning. Will continue to monitor 9. Seizure Disorder: Pt has a history of seizure disorder but no recent seizure  activity. Continue lamictal and Keppra   Code Status: Full Code Family Communication: Awaiting arrival of her husband Disposition Plan: Not yet ready for discharge  Debra Williamson A.  Pager [phone]. If 7PM-7AM, please contact night-coverage.  08/09/2015, 8:59 AM  LOS: 1 day  Interim History: Overnight she had an increased oxygen requirement and respiratory decline. She received 2 units RBC's and had minimal use of short acting opiates on PCA.  Consultants:  Critical Care-Dr. Kendrick Fries  Procedures:  None  Antibiotics:  Rocephin 10/18 >>10/19  Azithromycin 10/18 >>  Vancomycin 10/19 >>  Zosyn 10/19 >>  HPI/Subjective: Pt demonstrates an increased work of breathing but is mentally alert and oriented x 3. She has had 1 previous instance of Acute Chest Syndrome requiring intubation. I have discussed the possibility of intubation if she does not improve on BiPAP and depending on findings on CXR.  Objective: Filed Vitals:   08/09/15 0755 08/09/15 0759 08/09/15 0832 08/09/15 0836  BP:    126/97  Pulse:   99 101  Temp:      TempSrc:      Resp: Height:    (1.626 m)   Weight:   165 lb 9.1 oz (75.1 kg)   SpO2: 88% 94% 95% 94%   Weight change:   Intake/Output Summary (Last 24 hours) at 08/09/15 0859 Last data filed at 08/09/15 0810  Gross per 24 hour  Intake    700 ml  Output    900 ml  Net   -200 ml    General: Alert, awake, oriented x3, in acute respiratory distress with increased work of  breathing.  HEENT: [place]/AT PEERL, EOMI, anicteric Neck: Trachea midline,  no masses, no thyromegal,y no JVD, no carotid bruit OROPHARYNX:  Moist, No exudate/ erythema/lesions.  Heart: Regular rate but tachycardic. She is without murmurs, rubs, gallops, PMI non-displaced, no heaves or thrills on palpation.  Lungs: Ronchi throughout lung fields no wheezing noted. No increased vocal fremitus. Abdomen: Soft, nontender, nondistended, positive bowel sounds, no masses  no hepatosplenomegaly noted..  Neuro: No focal neurological deficits noted cranial nerves II through XII grossly intact.  Moves all extremities against gravity . Musculoskeletal: No warm swelling or erythema around joints, no spinal tenderness noted. Psychiatric: Patient alert and oriented x3, good insight and cognition, good recent to remote recall.    Data Reviewed: Basic Metabolic Panel:  Recent Labs Lab 08/08/15 1013 08/09/15 0745  NA 139 137  K 3.4* 3.5  CL 107 105  CO2 23 27  GLUCOSE 86 104*  BUN <5* 6  CREATININE 0.41* 0.45  CALCIUM 8.9 8.1*  MG 1.9  --    Liver Function Tests:  Recent Labs Lab 08/08/15 1013  AST 45*  ALT 24  ALKPHOS 84  BILITOT 2.6*  PROT 7.6  ALBUMIN 4.0   No results for input(s): LIPASE, AMYLASE in the last 168 hours. No results for input(s): AMMONIA in the last 168 hours. CBC:  Recent Labs Lab 08/08/15 1013 08/09/15 0745  WBC 17.3* 17.6*  NEUTROABS 13.2* 12.2*  HGB 6.4* 8.1*  HCT 18.7* 23.0*  MCV 102.2* 92.4  PLT 472* 454*   Cardiac Enzymes:  Recent Labs Lab 08/08/15 1013  TROPONINI <0.03   BNP (last 3 results) No results for input(s): BNP in the last 8760 hours.  ProBNP (last 3 results) No results for input(s): PROBNP in the last 8760 hours.  CBG: No results for input(s): GLUCAP in the last 168 hours.  No results found for this or any previous visit (from the past 240 hour(s)).   Studies: Dg Chest 2 View  08/08/2015  CLINICAL DATA:  Short of breath.  Sickle cell EXAM: CHEST  2 VIEW COMPARISON:  03/11/2015 FINDINGS: Hypoventilation with decreased lung volume. Bibasilar airspace disease has developed since prior study may represent pneumonia or atelectasis. No effusion. Port-A-Cath tip in the lower SVC. Heart size upper normal. Negative for heart failure. IMPRESSION: Patchy bibasilar atelectasis/infiltrate with decreased lung volume. Electronically Signed   By: Marlan Palau M.D.   On: 08/08/2015 10:12   Ct Angio  Chest Pe W/cm &/or Wo Cm  08/08/2015  CLINICAL DATA:  Left-sided chest pain.  Sickle cell crisis. EXAM: CT ANGIOGRAPHY CHEST WITH CONTRAST TECHNIQUE: Multidetector CT imaging of the chest was performed using the standard protocol during bolus administration of intravenous contrast. Multiplanar CT image reconstructions and MIPs were obtained to evaluate the vascular anatomy. CONTRAST:  OMNIPAQUE IOHEXOL 350 MG/ML SOLN COMPARISON:  None. FINDINGS: THORACIC INLET/BODY WALL: Right IJ porta catheter with tip at the upper cavoatrial junction. MEDIASTINUM: Borderline cardiomegaly. Negative aorta. CTA of the pulmonary arteries is limited by intermittent motion and bolus dispersion, but overall diagnostic and negative for pulmonary embolism. Mild hilar nodal enlargement, likely reactive to the pulmonary findings. LUNG WINDOWS: Bibasilar lung opacities with mild volume loss. Opacification is more extensive and consolidative on the left, where there is also small pleural effusion. UPPER ABDOMEN: Small calcified spleen OSSEOUS: No acute fracture.  No suspicious lytic or blastic lesions. Review of the MIP images confirms the above findings. IMPRESSION: 1. Extensive bibasilar atelectasis with superimposed pneumonia/acute chest syndrome on the  left. Small left pleural effusion. 2. No evidence of pulmonary embolism. Electronically Signed   By: Marnee SpringJonathon  Watts M.D.   On: 08/08/2015 14:30    Scheduled Meds: . aspirin EC  81 mg Oral Daily  . azithromycin  500 mg Intravenous Q24H  . dextromethorphan-guaiFENesin  2 tablet Oral BID  . enoxaparin (LOVENOX) injection  40 mg Subcutaneous QHS  . folic acid  1 mg Oral Daily  . HYDROmorphone   Intravenous 6 times per day  . Influenza vac split quadrivalent PF  0.5 mL Intramuscular Tomorrow-1000  . lamoTRIgine  200 mg Oral BID  . levETIRAcetam  1,000 mg Oral BID  . OxyCODONE  30 mg Oral Q12H  . potassium chloride  40 mEq Oral Q2H  . senna-docusate  1 tablet Oral BID    Continuous Infusions: . dextrose 5 % and 0.45% NaCl 100 mL/hr at 08/08/15 2351    CC time= 1 hour

## 2015-08-09 NOTE — Care Management Note (Signed)
Case Management Note  Patient Details  Name: Debra Williamson MRN: [id number] Date of Birth: [birth year]  Subjective/Objective:           Sickle cell crisis         Action/Plan:Date: August 09, 2015 Chart reviewed for concurrent status and case management needs. Will continue to follow patient for changes and needs: Marcelle Smilinghonda Davis, RN, BSN, [place]   [phone]   Expected Discharge Date:   (unknown)               Expected Discharge Plan:  Home/Self Care  In-House Referral:  NA  Discharge planning Services  CM Consult  Post Acute Care Choice:  NA Choice offered to:  NA  DME Arranged:    DME Agency:     HH Arranged:    HH Agency:     Status of Service:  In process, will continue to follow  Medicare Important Message Given:    Date Medicare IM Given:    Medicare IM give by:    Date Additional Medicare IM Given:    Additional Medicare Important Message give by:     If discussed at Long Length of Stay Meetings, dates discussed:    Additional Comments:  Golda AcreDavis, Rhonda Lynn, RN 08/09/2015, 10:31 AM

## 2015-08-09 NOTE — Progress Notes (Signed)
Shortly after changing to 4L  patient O2 saturation decreased to 77%, placed on 50% ventimask and spoke with Dr. Ashley RoyaltyMatthews via phone.  Orders for STAT ABG and transfer to Step Down received.  ABG completed and patient taken to room 1234 via bed.  O2 saturation 88-90% on 50% ventimask.  Report given to receiving ICU nurse from Jilda PandaEkua [place]quaah RN.

## 2015-08-09 NOTE — Progress Notes (Signed)
Entered room this morning in response to beeping IV pump. Patient sleeping, O2 saturation module on PCA turned off.  Awakened patient and she stated that she felt short of breath and was "scared".  Turned on O2 saturation module and noted O2 sat of 82%, RR 28, patient on 3L [place].  Encouraged deep breathing, O2 saturation briefly returned to the 90s then declined back to 80s.  Increased O2 to 4L [place] and added humidifier bottle.  Oxygen saturation now 96-97% with RR 20, patient stated that she felt a little better.  RT paged however nebulizer previously ordered was one time dose.  Will monitor closely and page MD for further nebulizer orders.

## 2015-08-10 ENCOUNTER — Inpatient Hospital Stay (HOSPITAL_COMMUNITY): Payer: Medicaid Other

## 2015-08-10 DIAGNOSIS — D5701 Hb-SS disease with acute chest syndrome: Secondary | ICD-10-CM | POA: Diagnosis present

## 2015-08-10 DIAGNOSIS — J9601 Acute respiratory failure with hypoxia: Secondary | ICD-10-CM

## 2015-08-10 LAB — PREPARE RBC (CROSSMATCH)

## 2015-08-10 LAB — COMPREHENSIVE METABOLIC PANEL
ALT: 16 U/L (ref 14–54)
AST: 26 U/L (ref 15–41)
Albumin: 2.9 g/dL — ABNORMAL LOW (ref 3.5–5.0)
Alkaline Phosphatase: 72 U/L (ref 38–126)
Anion gap: 5 (ref 5–15)
BUN: <5 mg/dL — ABNORMAL LOW (ref 6–20)
CO2: 29 mmol/L (ref 22–32)
Calcium: 8.6 mg/dL — ABNORMAL LOW (ref 8.9–10.3)
Chloride: 102 mmol/L (ref 101–111)
Creatinine, Ser: 0.47 mg/dL (ref 0.44–1.00)
GFR calc Af Amer: >60 mL/min (ref >60)
GFR calc non Af Amer: >60 mL/min (ref >60)
Glucose, Bld: 120 mg/dL — ABNORMAL HIGH (ref 65–99)
Potassium: 3.6 mmol/L (ref 3.5–5.1)
Sodium: 136 mmol/L (ref 135–145)
Total Bilirubin: 1.8 mg/dL — ABNORMAL HIGH (ref 0.3–1.2)
Total Protein: 6.4 g/dL — ABNORMAL LOW (ref 6.5–8.1)

## 2015-08-10 LAB — POCT I-STAT, CHEM 8
BUN: <3 mg/dL — ABNORMAL LOW (ref 6–20)
Calcium, Ion: 1.25 mmol/L — ABNORMAL HIGH (ref 1.12–1.23)
Chloride: 97 mmol/L — ABNORMAL LOW (ref 101–111)
Creatinine, Ser: 0.6 mg/dL (ref 0.44–1.00)
Glucose, Bld: 103 mg/dL — ABNORMAL HIGH (ref 65–99)
HCT: 24 % — ABNORMAL LOW (ref 36.0–46.0)
Hemoglobin: 8.2 g/dL — ABNORMAL LOW (ref 12.0–15.0)
Potassium: 3.8 mmol/L (ref 3.5–5.1)
Sodium: 136 mmol/L (ref 135–145)
TCO2: 28 mmol/L (ref 0–100)

## 2015-08-10 LAB — CBC WITH DIFFERENTIAL/PLATELET
Basophils Absolute: 0 10*3/uL (ref 0.0–0.1)
Basophils Relative: 0 %
Eosinophils Absolute: 0.6 10*3/uL (ref 0.0–0.7)
Eosinophils Relative: 3 %
HCT: 24.6 % — ABNORMAL LOW (ref 36.0–46.0)
Hemoglobin: 8.5 g/dL — ABNORMAL LOW (ref 12.0–15.0)
Lymphocytes Relative: 17 %
Lymphs Abs: 3.1 10*3/uL (ref 0.7–4.0)
MCH: 32.1 pg (ref 26.0–34.0)
MCHC: 34.6 g/dL (ref 30.0–36.0)
MCV: 92.8 fL (ref 78.0–100.0)
Monocytes Absolute: 2.6 10*3/uL — ABNORMAL HIGH (ref 0.1–1.0)
Monocytes Relative: 14 %
Neutro Abs: 12.4 10*3/uL — ABNORMAL HIGH (ref 1.7–7.7)
Neutrophils Relative %: 66 %
Platelets: 473 10*3/uL — ABNORMAL HIGH (ref 150–400)
RBC: 2.65 MIL/uL — ABNORMAL LOW (ref 3.87–5.11)
RDW: 19.4 % — ABNORMAL HIGH (ref 11.5–15.5)
WBC: 18.7 10*3/uL — ABNORMAL HIGH (ref 4.0–10.5)

## 2015-08-10 LAB — LEGIONELLA PNEUMOPHILA SEROGP 1 UR AG: L. pneumophila Serogp 1 Ur Ag: NEGATIVE

## 2015-08-10 LAB — PROCALCITONIN: Procalcitonin: <0.1 ng/mL

## 2015-08-10 MED ORDER — HYDROMORPHONE HCL 1 MG/ML IJ SOLN
1.0000 mg | INTRAMUSCULAR | Status: DC | PRN
  Administered 2015-08-10 – 2015-08-11 (×5): 1 mg via INTRAVENOUS
  Filled 2015-08-10 (×5): qty 1

## 2015-08-10 MED ORDER — LIDOCAINE HCL (PF) 1 % IJ SOLN
5.0000 mL | Freq: Once | INTRAMUSCULAR | Status: AC
  Administered 2015-08-10: 5 mL via INTRADERMAL

## 2015-08-10 MED ORDER — ACETAMINOPHEN 325 MG PO TABS: 650.0000 mg | ORAL_TABLET | ORAL | Status: DC | PRN

## 2015-08-10 MED ORDER — SODIUM CHLORIDE 0.9 % IV SOLN: 4.0000 g | Freq: Once | INTRAVENOUS | Status: DC

## 2015-08-10 MED ORDER — ACD FORMULA A 0.73-2.45-2.2 GM/100ML VI SOLN
Status: AC
  Filled 2015-08-10: qty 500

## 2015-08-10 MED ORDER — MIDAZOLAM HCL 2 MG/2ML IJ SOLN
INTRAMUSCULAR | Status: AC
  Filled 2015-08-10: qty 2

## 2015-08-10 MED ORDER — FENTANYL CITRATE (PF) 100 MCG/2ML IJ SOLN
INTRAMUSCULAR | Status: AC
  Filled 2015-08-10: qty 2

## 2015-08-10 MED ORDER — MIDAZOLAM HCL 2 MG/2ML IJ SOLN
2.0000 mg | Freq: Once | INTRAMUSCULAR | Status: AC
  Administered 2015-08-10: 2 mg via INTRAVENOUS

## 2015-08-10 MED ORDER — CALCIUM CARBONATE ANTACID 500 MG PO CHEW
2.0000 | CHEWABLE_TABLET | ORAL | Status: AC
  Filled 2015-08-10 (×2): qty 2

## 2015-08-10 MED ORDER — CALCIUM GLUCONATE 10 % IV SOLN
4.0000 g | Freq: Once | INTRAVENOUS | Status: AC
  Administered 2015-08-10: 2 g via INTRAVENOUS
  Filled 2015-08-10 (×2): qty 40

## 2015-08-10 MED ORDER — CALCIUM CARBONATE ANTACID 500 MG PO CHEW: 2.0000 | CHEWABLE_TABLET | ORAL | Status: DC

## 2015-08-10 MED ORDER — ACD FORMULA A 0.73-2.45-2.2 GM/100ML VI SOLN
500.0000 mL | Status: DC
  Administered 2015-08-10: 500 mL via INTRAVENOUS
  Filled 2015-08-10 (×2): qty 500

## 2015-08-10 MED ORDER — ANTICOAGULANT SODIUM CITRATE 4% (200MG/5ML) IV SOLN
5.0000 mL | Freq: Once | Status: AC
  Administered 2015-08-10: 5 mL
  Filled 2015-08-10 (×3): qty 250

## 2015-08-10 MED ORDER — LIDOCAINE HCL (PF) 1 % IJ SOLN
INTRAMUSCULAR | Status: AC
  Filled 2015-08-10: qty 5

## 2015-08-10 MED ORDER — HEPARIN SODIUM (PORCINE) 1000 UNIT/ML IJ SOLN
1000.0000 [IU] | Freq: Once | INTRAMUSCULAR | Status: DC
Start: 2015-08-10 — End: 2015-08-14

## 2015-08-10 MED ORDER — DIPHENHYDRAMINE HCL 25 MG PO CAPS: 25.0000 mg | ORAL_CAPSULE | Freq: Four times a day (QID) | ORAL | Status: DC | PRN

## 2015-08-10 MED ORDER — ACD FORMULA A 0.73-2.45-2.2 GM/100ML VI SOLN: 500.0000 mL | Status: DC

## 2015-08-10 MED ORDER — MIDAZOLAM HCL 2 MG/2ML IJ SOLN: 2.0000 mg | Freq: Once | INTRAMUSCULAR | Status: DC

## 2015-08-10 MED ORDER — DIPHENHYDRAMINE HCL 25 MG PO CAPS
25.0000 mg | ORAL_CAPSULE | Freq: Four times a day (QID) | ORAL | Status: DC | PRN
Start: 2015-08-10 — End: 2015-08-14
  Administered 2015-08-11 – 2015-08-14 (×5): 25 mg via ORAL
  Filled 2015-08-10 (×5): qty 1

## 2015-08-10 MED ORDER — ENOXAPARIN SODIUM 40 MG/0.4ML ~~LOC~~ SOLN
40.0000 mg | Freq: Every day | SUBCUTANEOUS | Status: DC
  Administered 2015-08-11 – 2015-08-13 (×3): 40 mg via SUBCUTANEOUS
  Filled 2015-08-10 (×5): qty 0.4

## 2015-08-10 NOTE — Progress Notes (Signed)
Red Cell Exchange performed without issue. Total Volume for a FCR 35% and ending HCT of 27%. Pt tolerated well without issue. Vitals remained within normal limits. Report given to primary RN on 76M.

## 2015-08-10 NOTE — Progress Notes (Signed)
eLink Physician-Brief Progress Note Patient Name: Debra Williamson DOB: [birth year] MRN: [id number]   Date of Service  08/10/2015  HPI/Events of Note  Notified of need for VTE prophylaxis. Creatinine = 0.6.  eICU Interventions  Will order Lovenox 40 mg [place] Q day.      Intervention Category Intermediate Interventions: Best-practice therapies (e.g. DVT, beta blocker, etc.)  Sommer,Steven Eugene 08/10/2015, 11:01 PM

## 2015-08-10 NOTE — Progress Notes (Signed)
Patient is constantly pulling off Non Re-breather mask and immediately oxygen levels desat into the 80's.  Patient has been educated on the dangers of PCA pump and lack of oxygen.  Patient is fully alert and oriented and understand consequences and still continues behavior.

## 2015-08-10 NOTE — Progress Notes (Signed)
Patient ID: Gloriajean DellShanequa Demauro, female   DOB: Sep 25, 1981, 34 y.o.   MRN: [id number] [facility] and is currently under care of Dr. Marchelle Gearingamaswamy. I spoke with Dr. Dalbert Mayotteamaswammy who requested that I write the orders for exchange transfusion. I saw and did a focued examination on Ns. [place] and recommends that we proceed with exchange transfusion to a gaol HCT of 24. Advised that patient should have an 11.5 F-13 F  Vas Catheter placed. Orders for Red Cell Pheresis placed. Anders SimmondsPete Babcock to place catheter.  Dialysis unit notified of planned Red Cell Pheresis today.

## 2015-08-10 NOTE — Procedures (Signed)
Pt on HFNC. No needed for BiPAP.

## 2015-08-10 NOTE — Procedures (Signed)
Central Venous dialysis Catheter Insertion Procedure Note Debra DellShanequa Williamson [id number] 20-Apr-1981  Procedure: Insertion of Central Venous dialysis Catheter Indications: transfusion exchange   Procedure Details Consent: Risks of procedure as well as the alternatives and risks of each were explained to the (patient/caregiver).  Consent for procedure obtained. Time Out: Verified patient identification, verified procedure, site/side was marked, verified correct patient position, special equipment/implants available, medications/allergies/relevent history reviewed, required imaging and test results available.  Performed  Maximum sterile technique was used including antiseptics, cap, gloves, gown, hand hygiene, mask and sheet. Skin prep: Chlorhexidine; local anesthetic administered A antimicrobial bonded/coated triple lumen catheter was placed in the right femoral vein due to multiple attempts, no other available access using the Seldinger technique.  Evaluation Blood flow good Complications: Complications of initially tried placing the line in left femoral vein. In spite of what looked like successful placement of guidewire in the vein I ended up dilating the artery. I spoke to vascular surgery. Removed cath and held pressure X 1hr. No notable complications. Good CMS, although pulses require doppler to verify on the left. The pt has no complaints. will cont to check doppler pulses q4hrs. Pt and husband aware Patient did tolerate procedure well. Chest X-ray ordered to verify placement.  CXR: n/a .  Debra Williamson,PETE 08/10/2015, 2:01 PM

## 2015-08-10 NOTE — Progress Notes (Signed)
Patient reports small amount of blood from both nares, notified respiratory therapy, settings changed on HFNC.

## 2015-08-10 NOTE — Procedures (Signed)
Upon arrival to the until pt was placed on NBR.  No distress noted and pt refuses the use on BiPAP.

## 2015-08-10 NOTE — Progress Notes (Signed)
Name: Debra Williamson MRN: [id number] DOB: 04-08-1981    ADMISSION DATE:  08/08/2015 CONSULTATION DATE:  10/19  REFERRING MD :  Dr. Ashley Royalty   CHIEF COMPLAINT:  SOB  BRIEF PATIENT DESCRIPTION:   34 y/o F with PMH of sickle cell, seizures and prior CVA who presented to [facility] on 10/18 with complaints of left sided chest pain with inspiration.  CTA of the Chest was negative for PE but demonstrated concern for CAP.  The patient developed worsening hypoxia and transferred to ICU on 10/19 and placed on bipap.   PCCM consulted 10/19 for evaluation.  SIGNIFICANT EVENTS  10/18  Admit with CAP, possible acute chest syndrome, anemia s/p transfusion 10/19 Transferred to ICU on BiPap for acute hypercarbic/hypoxic resp failure  10/20 Improved, plan for exchange transfusion   STUDIES:  10/19  CTA Chest >> extensive bibasilar airspace disease, air bronchograms noted, small left pleural effusion, neg for PE   SUBJECTIVE:  Pt "feels much better" Denies chest pain,but does report diffuse joint pain. Reports SOB especially on inspiration.   VITAL SIGNS: Temp:  [97.7 F (36.5 C)-100.5 F (38.1 C)] 98.2 F (36.8 C) (10/20 0800) Pulse Rate:  [72-116] 100 (10/20 0800) Resp:  [13-30] 25 (10/20 0800) BP: (91-138)/(51-82) 96/74 mmHg (10/20 0800) SpO2:  [89 %-100 %] 100 % (10/20 0800) FiO2 (%):  [55 %-100 %] 100 % (10/20 0800)  PHYSICAL EXAMINATION: General:  Alert, pleasant female in NAD Neuro: A&Ox4, no focal deficits  HEENT:  NCAT, MM pink/dry, no jvd Cardiovascular:  S1s2, RRR Lungs:  resp's even/non-labored, lungs diminished on L, fine crackles heard in Rt lung base  Abdomen: non-distended, non-tender. +bs  Musculoskeletal: intact, pain with ROM in joints  Skin:  Warm/dry, no edema    Recent Labs Lab 08/08/15 1013 08/09/15 0745  NA 139 137  K 3.4* 3.5  CL 107 105  CO2 23 27  BUN <5* 6  CREATININE 0.41* 0.45  GLUCOSE 86 104*    Recent Labs Lab 08/08/15 1013 08/09/15 0745  08/10/15 0755  HGB 6.4* 8.1* 8.5*  HCT 18.7* 23.0* 24.6*  WBC 17.3* 17.6* 18.7*  PLT 472* 454* 473*   Dg Chest 2 View  08/08/2015  CLINICAL DATA:  Short of breath.  Sickle cell EXAM: CHEST  2 VIEW COMPARISON:  03/11/2015 FINDINGS: Hypoventilation with decreased lung volume. Bibasilar airspace disease has developed since prior study may represent pneumonia or atelectasis. No effusion. Port-A-Cath tip in the lower SVC. Heart size upper normal. Negative for heart failure. IMPRESSION: Patchy bibasilar atelectasis/infiltrate with decreased lung volume. Electronically Signed   By: Marlan Palau M.D.   On: 08/08/2015 10:12   Ct Angio Chest Pe W/cm &/or Wo Cm  08/08/2015  CLINICAL DATA:  Left-sided chest pain.  Sickle cell crisis. EXAM: CT ANGIOGRAPHY CHEST WITH CONTRAST TECHNIQUE: Multidetector CT imaging of the chest was performed using the standard protocol during bolus administration of intravenous contrast. Multiplanar CT image reconstructions and MIPs were obtained to evaluate the vascular anatomy. CONTRAST:  OMNIPAQUE IOHEXOL 350 MG/ML SOLN COMPARISON:  None. FINDINGS: THORACIC INLET/BODY WALL: Right IJ porta catheter with tip at the upper cavoatrial junction. MEDIASTINUM: Borderline cardiomegaly. Negative aorta. CTA of the pulmonary arteries is limited by intermittent motion and bolus dispersion, but overall diagnostic and negative for pulmonary embolism. Mild hilar nodal enlargement, likely reactive to the pulmonary findings. LUNG WINDOWS: Bibasilar lung opacities with mild volume loss. Opacification is more extensive and consolidative on the left, where there is also small pleural effusion.  UPPER ABDOMEN: Small calcified spleen OSSEOUS: No acute fracture.  No suspicious lytic or blastic lesions. Review of the MIP images confirms the above findings. IMPRESSION: 1. Extensive bibasilar atelectasis with superimposed pneumonia/acute chest syndrome on the left. Small left pleural effusion. 2. No  evidence of pulmonary embolism. Electronically Signed   By: Marnee SpringJonathon  Watts M.D.   On: 08/08/2015 14:30   Dg Chest Port 1 View  08/10/2015  CLINICAL DATA:  Shortness of breath.  Pneumonia. EXAM: PORTABLE CHEST 1 VIEW COMPARISON:  08/09/2015. FINDINGS: New Port-A-Cath in stable position. Heart size stable. Diffuse bilateral airspace disease again noted, no interim change. Small pleural effusions cannot be excluded. No pneumothorax . IMPRESSION: 1. Port-A-Cath in stable position. 2. Diffuse dense bilateral airspace disease, no interim change. Small pleural effusions cannot be excluded . Electronically Signed   By: Maisie Fushomas  Register   On: 08/10/2015 07:15   Dg Chest Port 1 View  08/09/2015  CLINICAL DATA:  Acute respiratory distress, shortness of breath, sickle cell. EXAM: PORTABLE CHEST 1 VIEW COMPARISON:  08/09/2015. FINDINGS: Trachea is midline. Heart size is grossly stable. There is patchy consolidation at both lung bases, slightly progressive from earlier today. Possible bilateral pleural effusions. Right IJ Port-A-Cath terminates at the low SVC or SVC RA junction. IMPRESSION: Slight worsening in mid and lower lung zone bilateral airspace consolidation. Possible bilateral effusions. Electronically Signed   By: Leanna BattlesMelinda  Blietz M.D.   On: 08/09/2015 15:52   Dg Chest Port 1 View  08/09/2015  CLINICAL DATA:  Sickle cell anemia.  Shortness of breath EXAM: PORTABLE CHEST 1 VIEW COMPARISON:  08/08/2015 FINDINGS: Right double lumen Port-A-Cath remains in place, unchanged. Cardiomegaly. Worsening bilateral airspace opacities, left greater than right with probable left effusion. Findings could reflect asymmetric edema or infection. No acute bony abnormality. IMPRESSION: Bilateral perihilar and lower lobe opacities, left greater than right which could reflect asymmetric edema or infection. Cardiomegaly.  Small left effusion. Electronically Signed   By: Charlett NoseKevin  Dover M.D.   On: 08/09/2015 09:21    ASSESSMENT /  PLAN: Pulmonary A CAP - left greater than right bilateral lower airspace opacities with air bronchograms Acute Hypercarbic / Hypoxic Respiratory Failure >Improved 10/20 after Bipap therapy   Small Left Pleural Effusion   Plan: NRB  PRN BiPAP for increased WOB Pulmonary hygiene - IS, mobilize as able  Trend CXR  See ID  Cardiac:  A: Left sided Chest pain: Pleuritic r/t pleural effusion vs acute chest  Arterial placed HD cath-->removed w/out difficulty; pressure held.   Plan:  Pain control: dilaudid PRN  Home pain control regimen - oxycodone 10mg  Q4 PRN, oxycodone XR 30 mg Q12 Keep leg straight X 4 hrs. Doppler checks q 4  Renal:  No acute  Plan:  F/u Bmet   ID:  CAP- L >Rt SIRS   Plan:  BCx2 10/18>> NG x 24 hrs  Sputum cx 10/18>> Vanco 10/19 >> 10/20 Zosyn 10/19 >>  Azithro 10/19 >>   Hematology:  A:  Sickle Cell Anemia -s/p transfusion, Hgb 6.4 > 8.4   Plan: Exchange transfusion 10/20  Trend CBC  Cont home folic acid  Cont Lovenox  Neuro:  Seizure Disorder  Acute Pain   Plan: Continue home Lamictal / Keppra  Pain mgt as above   Today's Summary:  Pt is a [age] yof with sickle cell anemia with acute respiratory failure with hypoxemia on 10/19 r/t CAP vs acute chest syndrome. Pt's respiratory status and chest pain improved today (10/20). Plan is for  exchange transfusion, cont oxygen therapy with NRB and BiPap PRN, f/u CXR in AM. I initially placed the left HD cath in the left fem artery. I spoke to Br[facility]ham w/ vascular surgery. [facility]oval w/ hand pressure x1 hour which I did. Pt tolerated well. Good doppler pulses after. Pt and family aware. Cont pain management. Seems to be doing better.   Simonne Martinet ACNP-BC [facility] Pulmonary/Critical Care Pager # [phone] OR # [phone] if no answer

## 2015-08-11 ENCOUNTER — Inpatient Hospital Stay (HOSPITAL_COMMUNITY): Payer: Medicaid Other

## 2015-08-11 ENCOUNTER — Ambulatory Visit (HOSPITAL_COMMUNITY): Payer: Medicaid Other

## 2015-08-11 DIAGNOSIS — R52 Pain, unspecified: Secondary | ICD-10-CM

## 2015-08-11 LAB — TYPE AND SCREEN
ABO/RH(D): A POS
ABO/RH(D): A POS
Antibody Screen: NEGATIVE
Antibody Screen: NEGATIVE
Unit division: 0
Unit division: 0
Unit division: 0
Unit division: 0
Unit division: 0
Unit division: 0
Unit division: 0
Unit division: 0

## 2015-08-11 LAB — BASIC METABOLIC PANEL
Anion gap: 6 (ref 5–15)
BUN: <5 mg/dL — ABNORMAL LOW (ref 6–20)
CO2: 30 mmol/L (ref 22–32)
Calcium: 8.3 mg/dL — ABNORMAL LOW (ref 8.9–10.3)
Chloride: 97 mmol/L — ABNORMAL LOW (ref 101–111)
Creatinine, Ser: 0.56 mg/dL (ref 0.44–1.00)
GFR calc Af Amer: >60 mL/min (ref >60)
GFR calc non Af Amer: >60 mL/min (ref >60)
Glucose, Bld: 113 mg/dL — ABNORMAL HIGH (ref 65–99)
Potassium: 3.7 mmol/L (ref 3.5–5.1)
Sodium: 133 mmol/L — ABNORMAL LOW (ref 135–145)

## 2015-08-11 LAB — HEMOGLOBINOPATHY EVALUATION
Hgb A2 Quant: 3.8 % — ABNORMAL HIGH (ref 0.7–3.1)
Hgb A: 29 % — ABNORMAL LOW (ref 94.0–98.0)
Hgb C: 0 %
Hgb F Quant: 3 % — ABNORMAL HIGH (ref 0.0–2.0)
Hgb S Quant: 64.2 % — ABNORMAL HIGH

## 2015-08-11 LAB — CBC
HCT: 27 % — ABNORMAL LOW (ref 36.0–46.0)
Hemoglobin: 9 g/dL — ABNORMAL LOW (ref 12.0–15.0)
MCH: 30 pg (ref 26.0–34.0)
MCHC: 33.3 g/dL (ref 30.0–36.0)
MCV: 90 fL (ref 78.0–100.0)
Platelets: 267 10*3/uL (ref 150–400)
RBC: 3 MIL/uL — ABNORMAL LOW (ref 3.87–5.11)
RDW: 15.8 % — ABNORMAL HIGH (ref 11.5–15.5)
WBC: 17.8 10*3/uL — ABNORMAL HIGH (ref 4.0–10.5)

## 2015-08-11 LAB — CK TOTAL AND CKMB (NOT AT ARMC)
CK, MB: 1.1 ng/mL (ref 0.5–5.0)
Relative Index: INVALID (ref 0.0–2.5)
Total CK: 19 U/L — ABNORMAL LOW (ref 38–234)

## 2015-08-11 LAB — MAGNESIUM: Magnesium: 1.6 mg/dL — ABNORMAL LOW (ref 1.7–2.4)

## 2015-08-11 LAB — PHOSPHORUS: Phosphorus: 3.3 mg/dL (ref 2.5–4.6)

## 2015-08-11 LAB — LACTIC ACID, PLASMA: Lactic Acid, Venous: 0.8 mmol/L (ref 0.5–2.0)

## 2015-08-11 LAB — ABO/RH: ABO/RH(D): A POS

## 2015-08-11 LAB — PROCALCITONIN: Procalcitonin: <0.1 ng/mL

## 2015-08-11 MED ORDER — FENTANYL CITRATE (PF) 100 MCG/2ML IJ SOLN
12.5000 ug | Freq: Once | INTRAMUSCULAR | Status: AC
  Administered 2015-08-11: 12.5 ug via INTRAVENOUS
  Filled 2015-08-11: qty 2

## 2015-08-11 MED ORDER — DEXTROSE 5 % IV SOLN
1.0000 g | INTRAVENOUS | Status: DC
  Administered 2015-08-11 – 2015-08-13 (×3): 1 g via INTRAVENOUS
  Filled 2015-08-11 (×4): qty 10

## 2015-08-11 MED ORDER — SODIUM CHLORIDE 0.9 % IJ SOLN: 9.0000 mL | INTRAMUSCULAR | Status: DC | PRN

## 2015-08-11 MED ORDER — ONDANSETRON HCL 4 MG/2ML IJ SOLN: 4.0000 mg | Freq: Four times a day (QID) | INTRAMUSCULAR | Status: DC | PRN

## 2015-08-11 MED ORDER — SODIUM CHLORIDE 0.9 % IV SOLN
12.5000 mg | Freq: Four times a day (QID) | INTRAVENOUS | Status: DC | PRN
  Administered 2015-08-11 – 2015-08-12 (×2): 12.5 mg via INTRAVENOUS
  Filled 2015-08-11 (×5): qty 0.25

## 2015-08-11 MED ORDER — DIPHENHYDRAMINE HCL 12.5 MG/5ML PO ELIX
12.5000 mg | ORAL_SOLUTION | Freq: Four times a day (QID) | ORAL | Status: DC | PRN
  Filled 2015-08-11: qty 5

## 2015-08-11 MED ORDER — FENTANYL 40 MCG/ML IV SOLN
INTRAVENOUS | Status: DC
  Administered 2015-08-11: 240 ug via INTRAVENOUS
  Administered 2015-08-11: 15:00:00 via INTRAVENOUS
  Administered 2015-08-12: 280 ug via INTRAVENOUS
  Administered 2015-08-12: 80 ug via INTRAVENOUS
  Administered 2015-08-12: 120 ug via INTRAVENOUS
  Administered 2015-08-12: 11:00:00 via INTRAVENOUS
  Administered 2015-08-12: 200 ug via INTRAVENOUS
  Administered 2015-08-12: 280 ug via INTRAVENOUS
  Administered 2015-08-13: 120 ug via INTRAVENOUS
  Administered 2015-08-13: 13:00:00 via INTRAVENOUS
  Administered 2015-08-13: 80 ug via INTRAVENOUS
  Administered 2015-08-13: 20:00:00 via INTRAVENOUS
  Administered 2015-08-13: 40 ug via INTRAVENOUS
  Administered 2015-08-13: 240 ug via INTRAVENOUS
  Administered 2015-08-14: 37.5 ug via INTRAVENOUS
  Administered 2015-08-14: 150 ug via INTRAVENOUS
  Filled 2015-08-11 (×5): qty 25

## 2015-08-11 MED ORDER — WHITE PETROLATUM GEL
Status: AC
  Administered 2015-08-11: 0.2
  Filled 2015-08-11: qty 1

## 2015-08-11 MED ORDER — NALOXONE HCL 0.4 MG/ML IJ SOLN: 0.4000 mg | INTRAMUSCULAR | Status: DC | PRN

## 2015-08-11 NOTE — Progress Notes (Signed)
Pt transported to 2C04 in bed on monitor. Pts husband aware of room change. Pt's phone and charger in her possession.   Joni ReiningNicole RN present for pt arrival and tele hookup  Felipa EmoryJarrell, Chizara Mena Denise

## 2015-08-11 NOTE — Progress Notes (Signed)
Patient not wanting dialudid anymore Wants fentanyl - test dose 12.755mcg gave her relief withou sedation Wants PCA Also, demanding benadryl IV  Plan Start sickle cell fentanyl PCA with iv benadryl  Dr. Kalman ShanMurali Margarett Viti, M.D., [facility] Center[facility].C.P Pulmonary and Critical Care Medicine Staff Physician [facility] Pulmonary and Critical Care Pager: [phone], If no answer or between  15:00h - 7:00h: call [phone]  08/11/2015 1:34 PM

## 2015-08-11 NOTE — Progress Notes (Signed)
Placed pt on 4L [place] due to stable sats.  Pt in no distress.  Pt tol well

## 2015-08-11 NOTE — Progress Notes (Signed)
Name: Debra Williamson MRN: [id number] DOB: April 16, 1981    ADMISSION DATE:  08/08/2015 CONSULTATION DATE:  10/19  REFERRING MD :  Dr. Ashley RoyaltyMatthews   CHIEF COMPLAINT:  SOB  BRIEF PATIENT DESCRIPTION:   34 y/o F with PMH of sickle cell, seizures and prior CVA [facility] with complaints of left sided chest pain with inspiration.  CTA of the Chest was negative for PE but demonstrated concern for CAP.  The patient developed worsening hypoxia and transferred to ICU on 10/19 and placed on bipap.   PCCM consulted 10/19 for evaluation.  SIGNIFICANT EVENTS  10/18  Admit with CAP, possible acute chest syndrome, anemia s/p transfusion 10/19 Transferred to ICU on BiPap for acute hypercarbic/hypoxic resp failure  10/20 Improved, exchange transfusion   STUDIES:  10/19  CTA Chest >> extensive bibasilar airspace disease, air bronchograms noted, small left pleural effusion, neg for PE   SUBJECTIVE:  Denies chest pain,but does report diffuse joint pain. SOB improved. Dilaudid does not take edge off of pain  VITAL SIGNS: Temp:  [98.2 F (36.8 C)-100.4 F (38 C)] 98.6 F (37 C) (10/21 0306) Pulse Rate:  [89-116] 95 (10/21 0600) Resp:  [13-31] 16 (10/21 0600) BP: (85-115)/(46-78) 101/64 mmHg (10/21 0600) SpO2:  [90 %-100 %] 96 % (10/21 0600) FiO2 (%):  [50 %-100 %] 50 % (10/21 0600) Weight:  [165 lb 9.1 oz (75.1 kg)] 165 lb 9.1 oz (75.1 kg) (10/20 1344)  PHYSICAL EXAMINATION: General:  Alert, pleasant female in NAD Neuro: A&Ox4, no focal deficits  HEENT:  NCAT, MM pink/dry, no jvd Cardiovascular:  S1s2, RRR Lungs:  resp's even/non-labored, crackles in b/l bases  Abdomen: non-distended, non-tender. +bs  Musculoskeletal: intact, pain with ROM in joints  Skin:  Warm/dry, no edema    Recent Labs Lab 08/09/15 0745 08/10/15 0755 08/10/15 1410 08/11/15 0538  NA 137 136 136 133*  K 3.5 3.6 3.8 3.7  CL 105 102 97* 97*  CO2 27 29  --  30  BUN 6 <5* <3* <5*  CREATININE 0.45  0.47 0.60 0.56  GLUCOSE 104* 120* 103* 113*    Recent Labs Lab 08/09/15 0745 08/10/15 0755 08/10/15 1410 08/11/15 0538  HGB 8.1* 8.5* 8.2* 9.0*  HCT 23.0* 24.6* 24.0* 27.0*  WBC 17.6* 18.7*  --  17.8*  PLT 454* 473*  --  267   Dg Chest Port 1 View  08/11/2015  CLINICAL DATA:  Pneumonia.  Sickle cell disease . EXAM: PORTABLE CHEST 1 VIEW COMPARISON:  08/10/2015 . FINDINGS: Port-A-Cath in stable position. Stable cardiomegaly. Diffuse bilateral airspace disease noted without interim improvement. Small pleural effusion cannot be excluded. No pneumothorax. IMPRESSION: 1. Port-A-Cath in stable position. 2. Persistent cardiomegaly. 3. Diffuse bilateral airspace disease. No improvement. Small pleural effusions cannot be excluded . Electronically Signed   By: Maisie Fushomas  Register   On: 08/11/2015 07:33   Dg Chest Port 1 View  08/10/2015  CLINICAL DATA:  Shortness of breath.  Pneumonia. EXAM: PORTABLE CHEST 1 VIEW COMPARISON:  08/09/2015. FINDINGS: New Port-A-Cath in stable position. Heart size stable. Diffuse bilateral airspace disease again noted, no interim change. Small pleural effusions cannot be excluded. No pneumothorax . IMPRESSION: 1. Port-A-Cath in stable position. 2. Diffuse dense bilateral airspace disease, no interim change. Small pleural effusions cannot be excluded . Electronically Signed   By: Maisie Fushomas  Register   On: 08/10/2015 07:15   Dg Chest Port 1 View  08/09/2015  CLINICAL DATA:  Acute respiratory distress, shortness of breath, sickle  cell. EXAM: PORTABLE CHEST 1 VIEW COMPARISON:  08/09/2015. FINDINGS: Trachea is midline. Heart size is grossly stable. There is patchy consolidation at both lung bases, slightly progressive from earlier today. Possible bilateral pleural effusions. Right IJ Port-A-Cath terminates at the low SVC or SVC RA junction. IMPRESSION: Slight worsening in mid and lower lung zone bilateral airspace consolidation. Possible bilateral effusions. Electronically Signed    By: Leanna Battles M.D.   On: 08/09/2015 15:52   Dg Chest Port 1 View  08/09/2015  CLINICAL DATA:  Sickle cell anemia.  Shortness of breath EXAM: PORTABLE CHEST 1 VIEW COMPARISON:  08/08/2015 FINDINGS: Right double lumen Port-A-Cath remains in place, unchanged. Cardiomegaly. Worsening bilateral airspace opacities, left greater than right with probable left effusion. Findings could reflect asymmetric edema or infection. No acute bony abnormality. IMPRESSION: Bilateral perihilar and lower lobe opacities, left greater than right which could reflect asymmetric edema or infection. Cardiomegaly.  Small left effusion. Electronically Signed   By: Charlett Nose M.D.   On: 08/09/2015 09:21    ASSESSMENT / PLAN: Pulmonary A CAP - left greater than right bilateral lower airspace opacities with air bronchograms Acute Hypercarbic / Hypoxic Respiratory Failure >Improved 10/20 after Bipap therapy   Small Left Pleural Effusion   Plan: NRB  PRN BiPAP for increased WOB Pulmonary hygiene - IS, mobilize as able  Trend CXR  See ID  Cardiac:  A: Left sided Chest pain: Pleuritic r/t pleural effusion vs acute chest  Arterial placed HD cath-->removed w/out difficulty; pressure held.   Plan:  Pain control: dilaudid PRN  Home pain control regimen - oxycodone  Q4 PRN, oxycodone XR 30 mg Q12 Keep leg straight X 4 hrs. Doppler checks q 4  Renal:  No acute  Plan:  F/u Bmet   ID:  CAP- L >Rt SIRS   Plan:  BCx2 10/18>> NG x 24 hrs  Sputum cx 10/18>> Vanco 10/19 >> 10/20 Zosyn 10/19 >>  Azithro 10/19 >>   Hematology:  A:  Sickle Cell Anemia -s/p transfusion, Hgb 6.4 > 8.4   Plan: Exchange transfusion 10/20  Trend CBC  Cont home folic acid  Cont Lovenox  Neuro:  Seizure Disorder  Acute Pain   Plan: Continue home Lamictal / Keppra  Pain mgt as above   Erasmo Downer, MD, MPH PGY-2,  [facility] 08/11/2015 7:58 AM

## 2015-08-11 NOTE — Progress Notes (Signed)
*  PRELIMINARY RESULTS* Vascular Ultrasound Limited Lower Extremity Arterial Duplex has been completed. Order for left lower extremity arterial duplex to rule out pseudoaneurysm received. There is no obvious evidence of left common femoral pseudoaneurysm. The patient mentioned a second stick to the right groin with associated pain and requested examination. A brief evaluation of the right groin revealed no evidence of right femoral artery pseudoaneurysm, however due to line placement I was unable to evaluate the right common femoral artery.  08/11/2015 1:06 PM Gertie FeyMichelle Taiz Bickle, RVT, RDCS, RDMS

## 2015-08-12 LAB — GLUCOSE, CAPILLARY: Glucose-Capillary: 91 mg/dL (ref 65–99)

## 2015-08-12 LAB — PROCALCITONIN: Procalcitonin: <0.1 ng/mL

## 2015-08-12 LAB — BASIC METABOLIC PANEL
Anion gap: 6 (ref 5–15)
BUN: 6 mg/dL (ref 6–20)
CO2: 30 mmol/L (ref 22–32)
Calcium: 8.4 mg/dL — ABNORMAL LOW (ref 8.9–10.3)
Chloride: 96 mmol/L — ABNORMAL LOW (ref 101–111)
Creatinine, Ser: 0.49 mg/dL (ref 0.44–1.00)
GFR calc Af Amer: >60 mL/min (ref >60)
GFR calc non Af Amer: >60 mL/min (ref >60)
Glucose, Bld: 103 mg/dL — ABNORMAL HIGH (ref 65–99)
Potassium: 3.6 mmol/L (ref 3.5–5.1)
Sodium: 132 mmol/L — ABNORMAL LOW (ref 135–145)

## 2015-08-12 LAB — CBC
HCT: 25 % — ABNORMAL LOW (ref 36.0–46.0)
Hemoglobin: 8.5 g/dL — ABNORMAL LOW (ref 12.0–15.0)
MCH: 30.6 pg (ref 26.0–34.0)
MCHC: 34 g/dL (ref 30.0–36.0)
MCV: 89.9 fL (ref 78.0–100.0)
Platelets: 292 10*3/uL (ref 150–400)
RBC: 2.78 MIL/uL — ABNORMAL LOW (ref 3.87–5.11)
RDW: 16.1 % — ABNORMAL HIGH (ref 11.5–15.5)
WBC: 13.2 10*3/uL — ABNORMAL HIGH (ref 4.0–10.5)

## 2015-08-12 LAB — BLOOD PRODUCT ORDER (VERBAL) VERIFICATION

## 2015-08-12 LAB — MAGNESIUM: Magnesium: 1.8 mg/dL (ref 1.7–2.4)

## 2015-08-12 LAB — PHOSPHORUS: Phosphorus: 3.4 mg/dL (ref 2.5–4.6)

## 2015-08-12 MED ORDER — OXYCODONE HCL 5 MG PO TABS
10.0000 mg | ORAL_TABLET | ORAL | Status: DC | PRN
  Administered 2015-08-13: 10 mg via ORAL
  Filled 2015-08-12: qty 2

## 2015-08-12 NOTE — Progress Notes (Signed)
Per blood bank, Patient does not need a new "type and screen' until tomorrow midnight.

## 2015-08-12 NOTE — Progress Notes (Signed)
Spoke with Dr. Mikeal HawthorneGarba (sickle cell team) [phone] notified him that [name] a sickle cell patient was [facility] and was being transferred to the sickle cell team for continued care. [employer] account plannerHospitalist Flow manager is a sickle cell patient and has moved patient to sickle cell team for further management. Patient has not been seen by myself or any other [facility] Hospitalist today.

## 2015-08-12 NOTE — Progress Notes (Signed)
Subjective: A 34 year old female admitted with sickle cell painful crisis, acute hypoxemic respiratory failure and acute chest syndrome. Patient was transferred from [facility] to [facility] for exchange transfusion. She has been in the ICU and has been very stable afterwards. She has been transferred out of ICU him back to our team today. She is doing much better but has been placed on fentanyl PCA because she said Dilaudid PCA was not working for her. So far she is still having pain at 8 out of 10. Denied any significant shortness of breath no cough no fever no nausea vomiting or diarrhea. She is in the stepdown unit at this point.  Objective: Vital signs in last 24 hours: Temp:  [97.6 F (36.4 C)-98.7 F (37.1 C)] 98.1 F (36.7 C) (10/22 1615) Pulse Rate:  [85-104] 88 (10/22 1615) Resp:  [15-30] 19 (10/22 1615) BP: (84-113)/(41-69) 94/52 mmHg (10/22 1615) SpO2:  [98 %-100 %] 100 % (10/22 1615) FiO2 (%):  [40 %] 40 % (10/22 1111) Weight change:  Last BM Date: 08/11/15  Intake/Output from previous day: 10/21 0701 - 10/22 0700 In: 1745 [P.O.:720; I.V.:825; IV Piggyback:200] Out: -  Intake/Output this shift: Total I/O In: 1340 [P.O.:240; I.V.:750; IV Piggyback:350] Out: -   General appearance: alert, cooperative and no distress Eyes: conjunctivae/corneas clear. PERRL, EOM's intact. Fundi benign. Neck: no adenopathy, no carotid bruit, no JVD, supple, symmetrical, trachea midline and thyroid not enlarged, symmetric, no tenderness/mass/nodules Back: symmetric, no curvature. ROM normal. No CVA tenderness. Resp: clear to auscultation bilaterally Chest wall: no tenderness Cardio: regular rate and rhythm, S1, S2 normal, no murmur, click, rub or gallop GI: soft, non-tender; bowel sounds normal; no masses,  no organomegaly Extremities: extremities normal, atraumatic, no cyanosis or edema Pulses: 2+ and symmetric Skin: Skin color, texture, turgor normal. No rashes or lesions Neurologic:  Grossly normal  Lab Results:  Recent Labs  08/11/15 0538 08/12/15 0543  WBC 17.8* 13.2*  HGB 9.0* 8.5*  HCT 27.0* 25.0*  PLT 267 292   BMET  Recent Labs  08/11/15 0538 08/12/15 0543  NA 133* 132*  K 3.7 3.6  CL 97* 96*  CO2 30 30  GLUCOSE 113* 103*  BUN <5* 6  CREATININE 0.56 0.49  CALCIUM 8.3* 8.4*    Studies/Results: Dg Chest Port 1 View  08/11/2015  CLINICAL DATA:  Pneumonia.  Sickle cell disease . EXAM: PORTABLE CHEST 1 VIEW COMPARISON:  08/10/2015 . FINDINGS: Port-A-Cath in stable position. Stable cardiomegaly. Diffuse bilateral airspace disease noted without interim improvement. Small pleural effusion cannot be excluded. No pneumothorax. IMPRESSION: 1. Port-A-Cath in stable position. 2. Persistent cardiomegaly. 3. Diffuse bilateral airspace disease. No improvement. Small pleural effusions cannot be excluded . Electronically Signed   By: Maisie Fus  Register   On: 08/11/2015 07:33    Medications: I have reviewed the patient's current medications.  Assessment/Plan:  A 34 yo woman admitted with sickle cell painful crisis and acute chest syndrome. She has community-acquired pneumonia leading to acute chest syndrome. Patient has had x-rays transfusion and is not doing much better.  #1 acute chest syndrome: This seems to be resolved. Patient is status post exchange transfusion. H&H seems to be stable. We will continue to monitor patient. If she does well overnight will transfer her to [place] [facility].  #2 sickle cell painful crisis: Patient has been placed on fentanyl PCA with her oral medications. She is still complaining of more pain. I will continue long-acting OxyContin but add short acting oxycodone now. She cannot get  Toradol or any nonsteroidals due to allergies.  #3 acute hypoxic respiratory failure: Again this is resolved. Continue oxygen as needed  #4 anemia of chronic disease: Status post exchange transfusion. H&H is stable.  #5 hypokalemia: This has been repleted.  Continue to monitor  #6 seizure disorder: Patient on Lamictal and Keppra. Continue current treatment  #7 prolonged QTC: Stable. Continue monitoring. Repeat EKG before discharge.    LOS: 4 days   GARBA,LAWAL 08/12/2015, 5:59 PM

## 2015-08-13 LAB — COMPREHENSIVE METABOLIC PANEL
ALT: 17 U/L (ref 14–54)
AST: 26 U/L (ref 15–41)
Albumin: 2.9 g/dL — ABNORMAL LOW (ref 3.5–5.0)
Alkaline Phosphatase: 146 U/L — ABNORMAL HIGH (ref 38–126)
Anion gap: 4 — ABNORMAL LOW (ref 5–15)
BUN: <5 mg/dL — ABNORMAL LOW (ref 6–20)
CO2: 30 mmol/L (ref 22–32)
Calcium: 8.7 mg/dL — ABNORMAL LOW (ref 8.9–10.3)
Chloride: 101 mmol/L (ref 101–111)
Creatinine, Ser: 0.49 mg/dL (ref 0.44–1.00)
GFR calc Af Amer: >60 mL/min (ref >60)
GFR calc non Af Amer: >60 mL/min (ref >60)
Glucose, Bld: 100 mg/dL — ABNORMAL HIGH (ref 65–99)
Potassium: 4 mmol/L (ref 3.5–5.1)
Sodium: 135 mmol/L (ref 135–145)
Total Bilirubin: 1.2 mg/dL (ref 0.3–1.2)
Total Protein: 7 g/dL (ref 6.5–8.1)

## 2015-08-13 LAB — CBC WITH DIFFERENTIAL/PLATELET
Basophils Absolute: 0 10*3/uL (ref 0.0–0.1)
Basophils Relative: 0 %
Eosinophils Absolute: 0.9 10*3/uL — ABNORMAL HIGH (ref 0.0–0.7)
Eosinophils Relative: 10 %
HCT: 25 % — ABNORMAL LOW (ref 36.0–46.0)
Hemoglobin: 8.1 g/dL — ABNORMAL LOW (ref 12.0–15.0)
Lymphocytes Relative: 30 %
Lymphs Abs: 2.8 10*3/uL (ref 0.7–4.0)
MCH: 28.7 pg (ref 26.0–34.0)
MCHC: 32.4 g/dL (ref 30.0–36.0)
MCV: 88.7 fL (ref 78.0–100.0)
Monocytes Absolute: 1.3 10*3/uL — ABNORMAL HIGH (ref 0.1–1.0)
Monocytes Relative: 14 %
Neutro Abs: 4.3 10*3/uL (ref 1.7–7.7)
Neutrophils Relative %: 46 %
Platelets: 360 10*3/uL (ref 150–400)
RBC: 2.82 MIL/uL — ABNORMAL LOW (ref 3.87–5.11)
RDW: 17 % — ABNORMAL HIGH (ref 11.5–15.5)
WBC: 9.4 10*3/uL (ref 4.0–10.5)

## 2015-08-13 LAB — CULTURE, BLOOD (ROUTINE X 2)
Culture: NO GROWTH
Culture: NO GROWTH

## 2015-08-13 LAB — GLUCOSE, CAPILLARY
Glucose-Capillary: 87 mg/dL (ref 65–99)
Glucose-Capillary: 94 mg/dL (ref 65–99)
Glucose-Capillary: 95 mg/dL (ref 65–99)

## 2015-08-13 LAB — PROCALCITONIN: Procalcitonin: <0.1 ng/mL

## 2015-08-13 LAB — MAGNESIUM: Magnesium: 1.9 mg/dL (ref 1.7–2.4)

## 2015-08-13 LAB — PHOSPHORUS: Phosphorus: 3.9 mg/dL (ref 2.5–4.6)

## 2015-08-13 MED ORDER — AZITHROMYCIN 250 MG PO TABS
500.0000 mg | ORAL_TABLET | Freq: Every day | ORAL | Status: AC
  Administered 2015-08-13 – 2015-08-14 (×2): 500 mg via ORAL
  Filled 2015-08-13: qty 2
  Filled 2015-08-13: qty 1

## 2015-08-13 MED ORDER — KETOROLAC TROMETHAMINE 15 MG/ML IJ SOLN
15.0000 mg | Freq: Once | INTRAMUSCULAR | Status: DC
  Filled 2015-08-13: qty 1

## 2015-08-13 NOTE — Progress Notes (Signed)
PHARMACIST - PHYSICIAN COMMUNICATION DR:   azithromycin CONCERNING: Antibiotic IV to Oral Route Change Policy  RECOMMENDATION: This patient is receiving azithromycin by the intravenous route.  Based on criteria approved by the Pharmacy and Therapeutics Committee, the antibiotic(s) is/are being converted to the equivalent oral dose form(s).   DESCRIPTION: These criteria include:  Patient being treated for a respiratory tract infection, urinary tract infection, cellulitis or clostridium difficile associated diarrhea if on metronidazole  The patient is not neutropenic and does not exhibit a GI malabsorption state  The patient is eating (either orally or via tube) and/or has been taking other orally administered medications for a least 24 hours  The patient is improving clinically and has a Tmax < 100.5  If you have questions about this conversion, please contact the Pharmacy Department  []  ( [phone] )  [place] []  ( [phone] )  [facility] [x]  ( [phone]  [place] []  ( [phone] )  [facility] []  ( [phone] )  [facility]   Berle Fitz, PharmD, BCPS-AQ ID Clinical Pharmacist Pager [phone]  

## 2015-08-13 NOTE — Procedures (Signed)
Pt is comfortable on room air.  Pt refuses the use of BiPAP.

## 2015-08-13 NOTE — Progress Notes (Signed)
[facility]

## 2015-08-13 NOTE — Progress Notes (Signed)
[facility]

## 2015-08-13 NOTE — Progress Notes (Signed)
Subjective:  Patient has done much better today. Not having any shortness of breath or cough. Main complaint is pain in her back and legs. So far she is still having pain at 8 out of 10. No cough no fever no nausea vomiting or diarrhea. She is in the stepdown unit at this point. She is on fentanyl PCA at 40 g bolus dose 10 minutes interval. She has used 840 g with 38 demands and 21 deliveries in the last 24 hours she is also on OxyContin long-acting and started her short acting oxycodone in preparation for possible discharge soon.  Objective: Vital signs in last 24 hours: Temp:  [97.7 F (36.5 C)-98.5 F (36.9 C)] 98.2 F (36.8 C) (10/23 0824) Pulse Rate:  [80-88] 88 (10/23 0824) Resp:  [16-28] 26 (10/23 1200) BP: (94-109)/(51-66) 107/66 mmHg (10/23 0824) SpO2:  [95 %-100 %] 98 % (10/23 1200) Weight change:  Last BM Date: 08/11/15  Intake/Output from previous day: 10/22 0701 - 10/23 0700 In: 3100 [P.O.:1200; I.V.:1550; IV Piggyback:350] Out: -  Intake/Output this shift: Total I/O In: 100 [I.V.:100] Out: 300 [Urine:300]  General appearance: alert, cooperative and no distress Eyes: conjunctivae/corneas clear. PERRL, EOM's intact. Fundi benign. Neck: no adenopathy, no carotid bruit, no JVD, supple, symmetrical, trachea midline and thyroid not enlarged, symmetric, no tenderness/mass/nodules Back: symmetric, no curvature. ROM normal. No CVA tenderness. Resp: clear to auscultation bilaterally Chest wall: no tenderness Cardio: regular rate and rhythm, S1, S2 normal, no murmur, click, rub or gallop GI: soft, non-tender; bowel sounds normal; no masses,  no organomegaly Extremities: extremities normal, atraumatic, no cyanosis or edema Pulses: 2+ and symmetric Skin: Skin color, texture, turgor normal. No rashes or lesions Neurologic: Grossly normal  Lab Results:  Recent Labs  08/12/15 0543 08/13/15 0832  WBC 13.2* 9.4  HGB 8.5* 8.1*  HCT 25.0* 25.0*  PLT 292 360    BMET  Recent Labs  08/12/15 0543 08/13/15 0832  NA 132* 135  K 3.6 4.0  CL 96* 101  CO2 30 30  GLUCOSE 103* 100*  BUN 6 <5*  CREATININE 0.49 0.49  CALCIUM 8.4* 8.7*    Studies/Results: No results found.  Medications: I have reviewed the patient's current medications.  Assessment/Plan:  A 34 yo woman admitted with sickle cell painful crisis and acute chest syndrome. She has community-acquired pneumonia leading to acute chest syndrome. Patient has had x-rays transfusion and is not doing much better.  #1 acute chest syndrome: This seems to be resolved. Patient is status post exchange transfusion. H&H seems to be stable. [facility] 24 hours. Mobilize patient today for possible discharge.  #2 sickle cell painful crisis: Continue current regimen. Patient was reportedly allergic to Motrin however it appears not a true allergy. I will give her a trial of Toradol 15 mg now. If patient has no reaction to it we will schedule full dose Toradol for pain management.  #3 acute hypoxic respiratory failure: Again this is resolved. Continue oxygen as needed  #4 anemia of chronic disease: Status post exchange transfusion. H&H is stable.  #5 hypokalemia: This has been repleted. Continue to monitor  #6 seizure disorder: Patient on Lamictal and Keppra. Continue current treatment  #7 prolonged QTC: Stable. Continue monitoring. Repeat EKG before discharge.    LOS: 5 days   GARBA,LAWAL 08/13/2015, 12:12 PM

## 2015-08-14 ENCOUNTER — Encounter: Payer: Self-pay | Admitting: Internal Medicine

## 2015-08-14 DIAGNOSIS — D5701 Hb-SS disease with acute chest syndrome: Secondary | ICD-10-CM | POA: Diagnosis present

## 2015-08-14 LAB — BASIC METABOLIC PANEL
Anion gap: 8 (ref 5–15)
BUN: 7 mg/dL (ref 6–20)
CO2: 28 mmol/L (ref 22–32)
Calcium: 9.2 mg/dL (ref 8.9–10.3)
Chloride: 101 mmol/L (ref 101–111)
Creatinine, Ser: 0.53 mg/dL (ref 0.44–1.00)
GFR calc Af Amer: >60 mL/min (ref >60)
GFR calc non Af Amer: >60 mL/min (ref >60)
Glucose, Bld: 109 mg/dL — ABNORMAL HIGH (ref 65–99)
Potassium: 4.2 mmol/L (ref 3.5–5.1)
Sodium: 137 mmol/L (ref 135–145)

## 2015-08-14 LAB — MAGNESIUM: Magnesium: 2 mg/dL (ref 1.7–2.4)

## 2015-08-14 LAB — PHOSPHORUS: Phosphorus: 4.4 mg/dL (ref 2.5–4.6)

## 2015-08-14 MED ORDER — HEPARIN SOD (PORK) LOCK FLUSH 100 UNIT/ML IV SOLN
500.0000 [IU] | INTRAVENOUS | Status: AC | PRN
  Administered 2015-08-14: 500 [IU]

## 2015-08-14 MED ORDER — SODIUM CHLORIDE 0.9 % IJ SOLN
10.0000 mL | INTRAMUSCULAR | Status: DC | PRN
  Administered 2015-08-14 (×2): 10 mL
  Filled 2015-08-14 (×2): qty 40

## 2015-08-14 MED ORDER — LEVOFLOXACIN 750 MG PO TABS: 750.0000 mg | ORAL_TABLET | Freq: Every day | ORAL | Status: DC

## 2015-08-14 MED ORDER — OXYCODONE HCL ER 30 MG PO T12A: 1.0000 | EXTENDED_RELEASE_TABLET | Freq: Two times a day (BID) | ORAL | Status: DC

## 2015-08-14 MED ORDER — OXYCODONE HCL 10 MG PO TABS: 10.0000 mg | ORAL_TABLET | ORAL | Status: DC | PRN

## 2015-08-14 NOTE — Progress Notes (Signed)
Patient instructed to remain in bed until 1255.  Do not get dressing wet and remove after 24 hours.  Hold pressure if starts to bleed after discharge and return to ER if bleeding unable to be stopped.  Verbalized understanding. Akeyla Molden, Lajean ManesKerry Loraine, RN VAST

## 2015-08-14 NOTE — Discharge Summary (Signed)
Debra DellShanequa Newbold MRN: [id number] DOB/AGE: [birth year] 34 y.o.  Admit date: 08/08/2015 Discharge date: 08/14/2015  Primary Care Physician:  [facility]   Discharge Diagnoses:   Patient Active Problem List   Diagnosis Date Noted  . Hb-SS disease with acute chest syndrome (HCC) 08/14/2015  . Acute chest syndrome (HCC)   . Acute respiratory failure with hypoxemia (HCC)   . Community acquired pneumonia 08/08/2015  . Hemiparesis following cerebrovascular accident (CVA) (HCC) 05/30/2015  . Ankle gives out 05/30/2015  . Seizure disorder (HCC) 05/30/2015  . 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 H/O: stroke 03/12/2015  . Seizures (HCC) 03/12/2015  . Hemoglobin S-S disease (HCC) 03/11/2015  . Sickle cell crisis (HCC) 03/11/2015  . Symptomatic anemia 03/11/2015    DISCHARGE MEDICATION:   Medication List    TAKE these medications        aspirin EC 81 MG tablet  Take 1 tablet by mouth daily.     docusate sodium 100 MG capsule  Commonly known as:  COLACE  Take 1 capsule by mouth daily as needed for moderate constipation.     folic acid 1 MG tablet  Commonly known as:  FOLVITE  Take 1 mg by mouth daily.     lamoTRIgine 200 MG tablet  Commonly known as:  LAMICTAL  Take 1 tablet by mouth 2 (two) times daily.     levETIRAcetam 1000 MG tablet  Commonly known as:  KEPPRA  Take 1 tablet by mouth 2 (two) times daily.     levofloxacin 750 MG tablet  Commonly known as:  LEVAQUIN  Take 1 tablet (750 mg total) by mouth daily.     Oxycodone HCl 10 MG Tabs  Take 1 tablet (10 mg total) by mouth every 4 (four) hours as needed (pain).     OxyCODONE HCl ER 30 MG T12a  Take 1 tablet by mouth every 12 (twelve) hours.     promethazine 25 MG tablet  Commonly known as:  PHENERGAN  Take 1 tablet by mouth every 8 (eight) hours as needed. n/v     zolpidem 5 MG tablet  Commonly known as:  AMBIEN  Take 1 tablet (5 mg total) by mouth at  bedtime as needed. sleep          Consults:   pulmonary Critical Care.  SIGNIFICANT DIAGNOSTIC STUDIES:  Dg Chest 2 View  08/08/2015  CLINICAL DATA:  Short of breath.  Sickle cell EXAM: CHEST  2 VIEW COMPARISON:  03/11/2015 FINDINGS: Hypoventilation with decreased lung volume. Bibasilar airspace disease has developed since prior study may represent pneumonia or atelectasis. No effusion. Port-A-Cath tip in the lower SVC. Heart size upper normal. Negative for heart failure. IMPRESSION: Patchy bibasilar atelectasis/infiltrate with decreased lung volume. Electronically Signed   By: Marlan Palauharles  Clark M.D.   On: 08/08/2015 10:12   Ct Angio Chest Pe W/cm &/or Wo Cm  08/08/2015  CLINICAL DATA:  Left-sided chest pain.  Sickle cell crisis. EXAM: CT ANGIOGRAPHY CHEST WITH CONTRAST TECHNIQUE: Multidetector CT imaging of the chest was performed using the standard protocol during bolus administration of intravenous contrast. Multiplanar CT image reconstructions and MIPs were obtained to evaluate the vascular anatomy. CONTRAST:  100mL OMNIPAQUE IOHEXOL 350 MG/ML SOLN COMPARISON:  None. FINDINGS: THORACIC INLET/BODY WALL: Right IJ porta catheter with tip at the upper cavoatrial junction. MEDIASTINUM: Borderline cardiomegaly. Negative aorta. CTA of the pulmonary arteries is limited by intermittent motion and bolus dispersion, but overall diagnostic and  negative for pulmonary embolism. Mild hilar nodal enlargement, likely reactive to the pulmonary findings. LUNG WINDOWS: Bibasilar lung opacities with mild volume loss. Opacification is more extensive and consolidative on the left, where there is also small pleural effusion. UPPER ABDOMEN: Small calcified spleen OSSEOUS: No acute fracture.  No suspicious lytic or blastic lesions. Review of the MIP images confirms the above findings. IMPRESSION: 1. Extensive bibasilar atelectasis with superimposed pneumonia/acute chest syndrome on the left. Small left pleural effusion. 2.  No evidence of pulmonary embolism. Electronically Signed   By: Marnee Spring M.D.   On: 08/08/2015 14:30   Dg Chest Port 1 View  08/11/2015  CLINICAL DATA:  Pneumonia.  Sickle cell disease . EXAM: PORTABLE CHEST 1 VIEW COMPARISON:  08/10/2015 . FINDINGS: Port-A-Cath in stable position. Stable cardiomegaly. Diffuse bilateral airspace disease noted without interim improvement. Small pleural effusion cannot be excluded. No pneumothorax. IMPRESSION: 1. Port-A-Cath in stable position. 2. Persistent cardiomegaly. 3. Diffuse bilateral airspace disease. No improvement. Small pleural effusions cannot be excluded . Electronically Signed   By: Maisie Fus  Register   On: 08/11/2015 07:33   Dg Chest Port 1 View  08/10/2015  CLINICAL DATA:  Shortness of breath.  Pneumonia. EXAM: PORTABLE CHEST 1 VIEW COMPARISON:  08/09/2015. FINDINGS: New Port-A-Cath in stable position. Heart size stable. Diffuse bilateral airspace disease again noted, no interim change. Small pleural effusions cannot be excluded. No pneumothorax . IMPRESSION: 1. Port-A-Cath in stable position. 2. Diffuse dense bilateral airspace disease, no interim change. Small pleural effusions cannot be excluded . Electronically Signed   By: Maisie Fus  Register   On: 08/10/2015 07:15   Dg Chest Port 1 View  08/09/2015  CLINICAL DATA:  Acute respiratory distress, shortness of breath, sickle cell. EXAM: PORTABLE CHEST 1 VIEW COMPARISON:  08/09/2015. FINDINGS: Trachea is midline. Heart size is grossly stable. There is patchy consolidation at both lung bases, slightly progressive from earlier today. Possible bilateral pleural effusions. Right IJ Port-A-Cath terminates at the low SVC or SVC RA junction. IMPRESSION: Slight worsening in mid and lower lung zone bilateral airspace consolidation. Possible bilateral effusions. Electronically Signed   By: Leanna Battles M.D.   On: 08/09/2015 15:52   Dg Chest Port 1 View  08/09/2015  CLINICAL DATA:  Sickle cell anemia.   Shortness of breath EXAM: PORTABLE CHEST 1 VIEW COMPARISON:  08/08/2015 FINDINGS: Right double lumen Port-A-Cath remains in place, unchanged. Cardiomegaly. Worsening bilateral airspace opacities, left greater than right with probable left effusion. Findings could reflect asymmetric edema or infection. No acute bony abnormality. IMPRESSION: Bilateral perihilar and lower lobe opacities, left greater than right which could reflect asymmetric edema or infection. Cardiomegaly.  Small left effusion. Electronically Signed   By: Charlett Nose M.D.   On: 08/09/2015 09:21         OTHER PROCEDURES: Placement of Femoral Dialysis Catheter (VAS catheter): 10/20 Removal of femoral Dialysis Catheter (10/24) Therapeutic Apheresis for RBC (08/10/2015):  Total Volume for a FCR 35% and ending HCT of 27%.   Recent Results (from the past 240 hour(s))  Culture, blood (routine x 2) Call MD if unable to obtain prior to antibiotics being given     Status: None   Collection Time: 08/08/15  9:50 PM  Result Value Ref Range Status   Specimen Description BLOOD RIGHT HAND  Final   Special Requests BOTTLES DRAWN AEROBIC AND ANAEROBIC 4CC  Final   Culture   Final    NO GROWTH 5 DAYS Performed at [facility]  Report Status 08/13/2015 FINAL  Final  Culture, blood (routine x 2) Call MD if unable to obtain prior to antibiotics being given     Status: None   Collection Time: 08/08/15 10:55 PM  Result Value Ref Range Status   Specimen Description BLOOD RIGHT HAND  Final   Special Requests 5CC  Final   Culture   Final    NO GROWTH 5 DAYS Performed at [facility]    Report Status 08/13/2015 FINAL  Final  MRSA PCR Screening     Status: None   Collection Time: 08/09/15  8:42 AM  Result Value Ref Range Status   MRSA by PCR NEGATIVE NEGATIVE Final    Comment:        The GeneXpert MRSA Assay (FDA approved for NASAL specimens only), is one component of a comprehensive MRSA colonization surveillance  program. It is not intended to diagnose MRSA infection nor to guide or monitor treatment for MRSA infections.     BRIEF ADMITTING H & P: This is an opiate tolerant patient with a history of multiple CVA's and seizure disorder who presents with pain to the left chest wall. Pt states that pain occurred when she took a deep breath initially. However, that pain has intensified when taking a deep breath. She tried to manage pain at home with oral pain medications but was unsuccessful. She denies any fever, chills, N/V/D, Seizure activity, HA or focal weakness. She is being admitted for acute chest syndrome and sickle cell crisis.  In the ED she had a CTA which was negative for PE but showed consolidation on the left consistent with pneumonia vs acute chest syndrome. Pt has received Rocephin and Azithromycin in the ED. She has also received 3 doses of IV Dilaudid and is still having pain.   Hospital Course:  Present on Admission:  . Acute chest syndrome [facility]): Pt was diagnosed with Acute chest syndrome on admission. However her severity of illness was moderate and she was transfused 2 units RBC which raised her Hb to a goal 8.1 g/dL. However her hypoxic respiratory state progressed and she required Bi-PAP and hi-flo oxygen for respiratory support. She was transferred to [facility] where she underwent Therapeutic Apheresis of RBC to a ending HCT% of 27. She tolerated the procedure well and was able to be weaned off of the Oxygen at the time if discharge she is saturating 94% on RA with ambulation.  . Acute respiratory failure with hypoxemia [facility]): Secondary to Acute Chest Syndrome and presumed CAP. She was treated as above for the Acute Chest Syndrome. Pt also received empiric antibiotics for a presumed CAP which was felt to be masked by the lung changes associated with acute chest syndrome. She completed a full course of Azithromycin and 6 days of Rocephin. She is transitioned to Levaquin for 2  addiotional days to complete an 8 day course. . Community acquired pneumonia: Pt was treated with Rocephin and Azithromycin. She has 2 additional days of oral Levaquin left to complete an 8 day course. . Sickle cell crisis (HCC): Pt was initially treated with Dilaudid via PCA and MS Contin. After the RBC Therapeutic Pheresis, she was changed to a Fentanyl PCA which she tolerated without any difficulty. Initially NSAID's were withheld due to reported allergy. However she received a trial dose of Toradol and she tolerated well. She is being discharged home on Oxycontin 30 mg q 12 hours and Oxycodone 10 mg q 4 hours PRN pain. She last received  pain medications on 07/10/2015. She has been issued a prescription for Oxycontin 30 mg # 60 tans and Oxycodone 100 mg # 60 tabs. . Chronic Pain Management: Pt is currently under the care of Dr. Lyla Glassing with whom I discussed patient's current circumstance of not having any pain medication currently. Pt has pain contract with Dr. Lyla Glassing and she agrees with my issuing a prescription for Oxycontin 30 mg # 60 tans and Oxycodone 100 mg # 60 tabs.     Disposition and Follow-up: Pt discharged home in stable condition. Dr. Jerrye Beavers office will call patient  to schedule appointment.    DISCHARGE EXAM:  General: Alert, awake, oriented x3, in mild distress.  Vital Signs: BP 106/87, HR 86, T 98.7 F (37.1 C), temperature source Oral, RR 20, height  (1.626 m), weight 159 lb 8 oz (72.349 kg), last menstrual period 07/25/2015, SpO2 93 %. HEENT: [place]/AT PEERL, EOMI, anicteric OROPHARYNX: Moist, No exudate/ erythema/lesions.  Heart: Regular rate and rhythm, without murmurs, rubs, gallops or S3. PMI non-displaced. Exam reveals no decreased pulses. Pulmonary/Chest: Normal effort. Breath sounds normal. No. Apnea. Clear to auscultation,no stridor,  no wheezing and no rhonchi noted. No respiratory distress and no tenderness noted. Abdomen: Soft,  nontender, nondistended, normal bowel sounds, no masses no hepatosplenomegaly noted. No fluid wave and no ascites. There is no guarding or rebound. Neuro: Alert and oriented to person, place and time. Normal motor skills, Displays no atrophy or tremors and exhibits normal muscle tone.  No focal neurological deficits noted cranial nerves II through XII grossly intact. No sensory deficit noted. Strength at baseline in bilateral upper and lower extremities. Gait normal. Musculoskeletal: No warm swelling or erythema around joints, no spinal tenderness noted. Psychiatric: Patient alert and oriented x3, good insight and cognition, good recent to remote recall. Mood, memory, affect and judgement normal Skin: Skin is warm and dry. No bruising, no ecchymosis and no rash noted. Pt is not diaphoretic. No erythema. No pallor    Recent Labs  08/13/15 0832 08/14/15 0447  NA 135 137  K 4.0 4.2  CL 101 101  CO2 30 28  GLUCOSE 100* 109*  BUN <5* 7  CREATININE 0.49 0.53  CALCIUM 8.7* 9.2  MG 1.9 2.0  PHOS 3.9 4.4    Recent Labs  08/13/15 0832  AST 26  ALT 17  ALKPHOS 146*  BILITOT 1.2  PROT 7.0  ALBUMIN 2.9*   No results for input(s): LIPASE, AMYLASE in the last 72 hours.  Recent Labs  08/12/15 0543 08/13/15 0832  WBC 13.2* 9.4  NEUTROABS  --  4.3  HGB 8.5* 8.1*  HCT 25.0* 25.0*  MCV 89.9 88.7  PLT 292 360     Total time spent including face to face and decision making was greater than 30 minutes  Signed: MATTHEWS,MICHELLE A. 08/14/2015, 11:57 AM

## 2015-08-14 NOTE — Progress Notes (Signed)
Fentanyl PCA discontinued, wasted in sink  8ml witnessed by Mauri BrooklynBrooke Shaw RN.

## 2015-08-24 ENCOUNTER — Encounter (HOSPITAL_COMMUNITY): Payer: Self-pay | Admitting: Emergency Medicine

## 2015-08-24 ENCOUNTER — Emergency Department (HOSPITAL_COMMUNITY)
Admission: EM | Admit: 2015-08-24 | Discharge: 2015-08-24 | Disposition: A | Discharge status: Home / Self Care | Payer: Medicaid Other | Attending: Emergency Medicine | Admitting: Emergency Medicine

## 2015-08-24 DIAGNOSIS — G40909 Epilepsy, unspecified, not intractable, without status epilepticus: Secondary | ICD-10-CM | POA: Insufficient documentation

## 2015-08-24 DIAGNOSIS — Z7982 Long term (current) use of aspirin: Secondary | ICD-10-CM | POA: Insufficient documentation

## 2015-08-24 DIAGNOSIS — D571 Sickle-cell disease without crisis: Secondary | ICD-10-CM

## 2015-08-24 DIAGNOSIS — Z79899 Other long term (current) drug therapy: Secondary | ICD-10-CM | POA: Diagnosis not present

## 2015-08-24 DIAGNOSIS — M545 Low back pain, unspecified: Secondary | ICD-10-CM

## 2015-08-24 DIAGNOSIS — D649 Anemia, unspecified: Secondary | ICD-10-CM | POA: Diagnosis not present

## 2015-08-24 DIAGNOSIS — D5700 Hb-SS disease with crisis, unspecified: Secondary | ICD-10-CM | POA: Diagnosis present

## 2015-08-24 DIAGNOSIS — Z8673 Personal history of transient ischemic attack (TIA), and cerebral infarction without residual deficits: Secondary | ICD-10-CM | POA: Insufficient documentation

## 2015-08-24 DIAGNOSIS — Z3202 Encounter for pregnancy test, result negative: Secondary | ICD-10-CM | POA: Diagnosis not present

## 2015-08-24 LAB — URINALYSIS, ROUTINE W REFLEX MICROSCOPIC
Bilirubin Urine: NEGATIVE
Glucose, UA: NEGATIVE mg/dL
Ketones, ur: NEGATIVE mg/dL
Leukocytes, UA: NEGATIVE
Nitrite: NEGATIVE
Protein, ur: NEGATIVE mg/dL
Specific Gravity, Urine: 1.015 (ref 1.005–1.030)
Urobilinogen, UA: 0.2 mg/dL (ref 0.0–1.0)
pH: 5.5 (ref 5.0–8.0)

## 2015-08-24 LAB — RETICULOCYTES
RBC.: 2.4 MIL/uL — ABNORMAL LOW (ref 3.87–5.11)
Retic Count, Absolute: 55.2 10*3/uL (ref 19.0–186.0)
Retic Ct Pct: 2.3 % (ref 0.4–3.1)

## 2015-08-24 LAB — BASIC METABOLIC PANEL
Anion gap: 6 (ref 5–15)
BUN: <5 mg/dL — ABNORMAL LOW (ref 6–20)
CO2: 28 mmol/L (ref 22–32)
Calcium: 9.2 mg/dL (ref 8.9–10.3)
Chloride: 106 mmol/L (ref 101–111)
Creatinine, Ser: 0.59 mg/dL (ref 0.44–1.00)
GFR calc Af Amer: >60 mL/min (ref >60)
GFR calc non Af Amer: >60 mL/min (ref >60)
Glucose, Bld: 95 mg/dL (ref 65–99)
Potassium: 3 mmol/L — ABNORMAL LOW (ref 3.5–5.1)
Sodium: 140 mmol/L (ref 135–145)

## 2015-08-24 LAB — CBC WITH DIFFERENTIAL/PLATELET
Basophils Absolute: 0.1 10*3/uL (ref 0.0–0.1)
Basophils Relative: 1 %
Eosinophils Absolute: 0.3 10*3/uL (ref 0.0–0.7)
Eosinophils Relative: 2 %
HCT: 21.4 % — ABNORMAL LOW (ref 36.0–46.0)
Hemoglobin: 7 g/dL — ABNORMAL LOW (ref 12.0–15.0)
Lymphocytes Relative: 29 %
Lymphs Abs: 3.9 10*3/uL (ref 0.7–4.0)
MCH: 29.2 pg (ref 26.0–34.0)
MCHC: 32.7 g/dL (ref 30.0–36.0)
MCV: 89.2 fL (ref 78.0–100.0)
Monocytes Absolute: 1.2 10*3/uL — ABNORMAL HIGH (ref 0.1–1.0)
Monocytes Relative: 9 %
Neutro Abs: 8 10*3/uL — ABNORMAL HIGH (ref 1.7–7.7)
Neutrophils Relative %: 59 %
Platelets: 1007 10*3/uL (ref 150–400)
RBC: 2.4 MIL/uL — ABNORMAL LOW (ref 3.87–5.11)
RDW: 17.1 % — ABNORMAL HIGH (ref 11.5–15.5)
WBC: 13.5 10*3/uL — ABNORMAL HIGH (ref 4.0–10.5)

## 2015-08-24 LAB — LACTATE DEHYDROGENASE: LDH: 176 U/L (ref 98–192)

## 2015-08-24 LAB — URINE MICROSCOPIC-ADD ON

## 2015-08-24 LAB — PREGNANCY, URINE: Preg Test, Ur: NEGATIVE

## 2015-08-24 MED ORDER — DIPHENHYDRAMINE HCL 50 MG/ML IJ SOLN
25.0000 mg | Freq: Once | INTRAMUSCULAR | Status: AC
  Administered 2015-08-24: 25 mg via INTRAVENOUS
  Filled 2015-08-24: qty 1

## 2015-08-24 MED ORDER — SODIUM CHLORIDE 0.9 % IV BOLUS (SEPSIS)
1000.0000 mL | Freq: Once | INTRAVENOUS | Status: AC
  Administered 2015-08-24: 1000 mL via INTRAVENOUS

## 2015-08-24 MED ORDER — HEPARIN SOD (PORK) LOCK FLUSH 100 UNIT/ML IV SOLN
500.0000 [IU] | Freq: Once | INTRAVENOUS | Status: AC
  Administered 2015-08-24: 500 [IU]
  Filled 2015-08-24: qty 5

## 2015-08-24 MED ORDER — HYDROMORPHONE HCL 2 MG/ML IJ SOLN
2.0000 mg | Freq: Once | INTRAMUSCULAR | Status: AC
  Administered 2015-08-24: 2 mg via INTRAVENOUS
  Filled 2015-08-24: qty 1

## 2015-08-24 MED ORDER — PROMETHAZINE HCL 25 MG/ML IJ SOLN
25.0000 mg | Freq: Once | INTRAMUSCULAR | Status: AC
  Administered 2015-08-24: 25 mg via INTRAVENOUS
  Filled 2015-08-24: qty 1

## 2015-08-24 MED ORDER — KETOROLAC TROMETHAMINE 30 MG/ML IJ SOLN
30.0000 mg | Freq: Once | INTRAMUSCULAR | Status: AC
  Administered 2015-08-24: 30 mg via INTRAVENOUS
  Filled 2015-08-24: qty 1

## 2015-08-24 NOTE — ED Provider Notes (Signed)
CSN: [id number]     Arrival date & time 08/24/15  1020 History   First MD Initiated Contact with Patient 08/24/15 1028     Chief Complaint  Patient presents with  . Sickle Cell Pain Crisis     (Consider location/radiation/quality/duration/timing/severity/associated sxs/prior Treatment) HPI Patient states she awakened with pain in her lower back this morning. She states now it is more concentrated in the right side of her lower back. She denies associated symptoms of radiation into the leg or weakness. No associated abdominal pain. No urinary symptoms of pain burning or urgency. No fever, cough, shortness of breath or chest pain. Patient had a hospital admission for Acute Chest Syndrome at the end of October. During that hospitalization she did undergo apheresis of RBCs. She was discharged on 10\24 and had been doing well since that time. Patient reports she is currently having her menstrual cycle. She states she typically gets lower abdominal cramping with that although she is not having at this time. Past Medical History  Diagnosis Date  . Sickle cell disease (HCC)   . Seizures (HCC)     one time incident, may have been r/t to a pain medication she was prescribed  . Anemia   . Blood transfusion without reported diagnosis   . Stroke [facility])    Past Surgical History  Procedure Laterality Date  . Cholecystectomy    . Cesarean section      x4  . Reverse tubal ligation    . Portacath placement Right w-3   Family History  Problem Relation Age of Onset  . HIV/AIDS Father    Social History  Substance Use Topics  . Smoking status: Never Smoker   . Smokeless tobacco: Never Used  . Alcohol Use: Yes     Comment: occasionally   OB History    No data available     Review of Systems  10 Systems reviewed and are negative for acute change except as noted in the HPI.   Allergies  Ibuprofen; Zofran; Demerol; Fentanyl and related; Other; Codeine; and Hydrocodone  Home Medications    Prior to Admission medications   Medication Sig Start Date End Date Taking? Authorizing Provider  aspirin EC 81 MG tablet Take 1 tablet by mouth daily. 11/03/13  Yes Historical Provider, MD  folic acid (FOLVITE) 1 MG tablet Take 1 mg by mouth daily.  02/13/15  Yes Historical Provider, MD  lamoTRIgine (LAMICTAL) 200 MG tablet Take 1 tablet by mouth 2 (two) times daily. 02/13/15  Yes Historical Provider, MD  levETIRAcetam (KEPPRA) 1000 MG tablet Take 1 tablet by mouth 2 (two) times daily. 02/13/15  Yes Historical Provider, MD  Oxycodone HCl 10 MG TABS Take 1 tablet (10 mg total) by mouth every 4 (four) hours as needed (pain). 08/14/15  Yes Altha Harm, MD  promethazine (PHENERGAN) 25 MG tablet Take 1 tablet by mouth every 8 (eight) hours as needed. n/v 02/13/15  Yes Historical Provider, MD  zolpidem (AMBIEN) 5 MG tablet Take 1 tablet (5 mg total) by mouth at bedtime as needed. sleep 03/13/15  Yes Altha Harm, MD  levofloxacin (LEVAQUIN) 750 MG tablet Take 1 tablet (750 mg total) by mouth daily. Patient not taking: Reported on 08/24/2015 08/14/15   Altha Harm, MD  OxyCODONE HCl ER 30 MG T12A Take 1 tablet by mouth every 12 (twelve) hours. Patient not taking: Reported on 08/24/2015 08/14/15   Altha Harm, MD   BP 105/64 mmHg  Pulse 87  Temp(Src)  97.8 F (36.6 C) (Oral)  Resp 18  SpO2 97%  LMP 07/25/2015 Physical Exam  Constitutional: She is oriented to person, place, and time. She appears well-developed and well-nourished.  HENT:  Head: Normocephalic and atraumatic.  Eyes: EOM are normal. Pupils are equal, round, and reactive to light.  Neck: Neck supple.  Cardiovascular: Normal rate, regular rhythm, normal heart sounds and intact distal pulses.   Pulmonary/Chest: Effort normal and breath sounds normal.  Abdominal: Soft. Bowel sounds are normal. She exhibits no distension. There is no tenderness.  No CVA tenderness.  Musculoskeletal: Normal range of motion. She  exhibits no edema.  Patient endorses mildly reproducible pain along her sacroiliac area on the right and iliac crest. Lower extremity exam is normal without calf tenderness or edema. She does permanently wear a splint on her left lower extremity from old stroke associated with sickle cell disease  Neurological: She is alert and oriented to person, place, and time. She has normal strength. Coordination normal. GCS eye subscore is 4. GCS verbal subscore is 5. GCS motor subscore is 6.  Skin: Skin is warm, dry and intact.  Psychiatric: She has a normal mood and affect.    ED Course  Procedures (including critical care time) Labs Review Labs Reviewed  URINALYSIS, ROUTINE W REFLEX MICROSCOPIC (NOT AT [facility]) - Abnormal; Notable for the following:    Color, Urine AMBER (*)    APPearance CLOUDY (*)    Hgb urine dipstick LARGE (*)    All other components within normal limits  BASIC METABOLIC PANEL - Abnormal; Notable for the following:    Potassium 3.0 (*)    BUN <5 (*)    All other components within normal limits  CBC WITH DIFFERENTIAL/PLATELET - Abnormal; Notable for the following:    WBC 13.5 (*)    RBC 2.40 (*)    Hemoglobin 7.0 (*)    HCT 21.4 (*)    RDW 17.1 (*)    Platelets 1007 (*)    Neutro Abs 8.0 (*)    Monocytes Absolute 1.2 (*)    All other components within normal limits  RETICULOCYTES - Abnormal; Notable for the following:    RBC. 2.40 (*)    All other components within normal limits  URINE MICROSCOPIC-ADD ON - Abnormal; Notable for the following:    Casts HYALINE CASTS (*)    All other components within normal limits  PREGNANCY, URINE  LACTATE DEHYDROGENASE    Imaging Review No results found. I have personally reviewed and evaluated these images and lab results as part of my medical decision-making.   EKG Interpretation None     Consult: Patient's case was reviewed with Dr. Ashley Royalty. We did review the patient's thrombocytosis that she's developed since her last  hospitalization. At this time she did not think that immediate intervention was needed. She advises patient can continue her daily aspirin and follow up with her hematologist.She suggests that there can be a significant acute phase reaction after the Apheresis and if there are no specific findings, she is appropriate to follow up with her outpatient hematologist. MDM   Final diagnoses:  Right-sided low back pain without sciatica  Hb-SS disease without crisis [facility])   Patient presents with lower back pain that developed today. This appears consistent with sacroiliac type pain. She does not have any sciatica or weakness associated with this. There is no associated intra-abdominal pain, urinary symptoms, fever or hip pain. Patient otherwise is well in appearance. Her labs are stable without any  reticulocytosis or elevation in her LDH. This does not appear to be an acute sickle cell exacerbation. She is discharged with instructions to follow-up with her outpatient provider. She did advise me that she did not have her 30 mg OxyContin, and extended release that she used to get due to insurance problems. It was however prescribed after her last discharge. I did advise patient that I could not prescribe any outpatient narcotic medications for her and this would have to be addressed with her provider.    Arby BarretteMarcy Linsy Ehresman, MD 08/24/15 1500

## 2015-08-24 NOTE — ED Notes (Signed)
Pt c/o sickle cell pain in her back since yesterday.  Denies NVD.

## 2015-08-24 NOTE — Discharge Instructions (Signed)
Back Pain, Adult °Back pain is very common in adults. The cause of back pain is rarely dangerous and the pain often gets better over time. The cause of your back pain may not be known. Some common causes of back pain include: °· Strain of the muscles or ligaments supporting the spine. °· Wear and tear (degeneration) of the spinal disks. °· Arthritis. °· Direct injury to the back. °For many people, back pain may return. Since back pain is rarely dangerous, most people can learn to manage this condition on their own. °HOME CARE INSTRUCTIONS °Watch your back pain for any changes. The following actions may help to lessen any discomfort you are feeling: °· Remain active. It is stressful on your back to sit or stand in one place for long periods of time. Do not sit, drive, or stand in one place for more than 30 minutes at a time. Take short walks on even surfaces as soon as you are able. Try to increase the length of time you walk each day. °· Exercise regularly as directed by your health care provider. Exercise helps your back heal faster. It also helps avoid future injury by keeping your muscles strong and flexible. °· Do not stay in bed. Resting more than 1-2 days can delay your recovery. °· Pay attention to your body when you bend and lift. The most comfortable positions are those that put less stress on your recovering back. Always use proper lifting techniques, including: °· Bending your knees. °· Keeping the load close to your body. °· Avoiding twisting. °· Find a comfortable position to sleep. Use a firm mattress and lie on your side with your knees slightly bent. If you lie on your back, put a pillow under your knees. °· Avoid feeling anxious or stressed. Stress increases muscle tension and can worsen back pain. It is important to recognize when you are anxious or stressed and learn ways to manage it, such as with exercise. °· Take medicines only as directed by your health care provider. Over-the-counter  medicines to reduce pain and inflammation are often the most helpful. Your health care provider may prescribe muscle relaxant drugs. These medicines help dull your pain so you can more quickly return to your normal activities and healthy exercise. °· Apply ice to the injured area: °· Put ice in a plastic bag. °· Place a towel between your skin and the bag. °· Leave the ice on for 20 minutes, 2-3 times a day for the first 2-3 days. After that, ice and heat may be alternated to reduce pain and spasms. °· Maintain a healthy weight. Excess weight puts extra stress on your back and makes it difficult to maintain good posture. °SEEK MEDICAL CARE IF: °· You have pain that is not relieved with rest or medicine. °· You have increasing pain going down into the legs or buttocks. °· You have pain that does not improve in one week. °· You have night pain. °· You lose weight. °· You have a fever or chills. °SEEK IMMEDIATE MEDICAL CARE IF:  °· You develop new bowel or bladder control problems. °· You have unusual weakness or numbness in your arms or legs. °· You develop nausea or vomiting. °· You develop abdominal pain. °· You feel faint. °  °This information is not intended to replace advice given to you by your health care provider. Make sure you discuss any questions you have with your health care provider. °  °Document Released: 10/07/2005 Document Revised: 10/28/2014 Document Reviewed: 02/08/2014 °Elsevier Interactive Patient Education ©2016 Elsevier   Inc. Sickle Cell Anemia, Adult Sickle cell anemia is a condition in which red blood cells have an abnormal "sickle" shape. This abnormal shape shortens the cells' life span, which results in a lower than normal concentration of red blood cells in the blood. The sickle shape also causes the cells to clump together and block free blood flow through the blood vessels. As a result, the tissues and organs of the body do not receive enough oxygen. Sickle cell anemia causes organ  damage and pain and increases the risk of infection. CAUSES  Sickle cell anemia is a genetic disorder. Those who receive two copies of the gene have the condition, and those who receive one copy have the trait. RISK FACTORS The sickle cell gene is most common in people whose families originated in [place]. Other areas of the globe where sickle cell trait occurs include the [place], [place] and [place], the [place], and the [place].  SIGNS AND SYMPTOMS  Pain, especially in the extremities, back, chest, or abdomen (common). The pain may start suddenly or may develop following an illness, especially if there is dehydration. Pain can also occur due to overexertion or exposure to extreme temperature changes.  Frequent severe bacterial infections, especially certain types of pneumonia and meningitis.  Pain and swelling in the hands and feet.  Decreased activity.   Loss of appetite.   Change in behavior.  Headaches.  Seizures.  Shortness of breath or difficulty breathing.  Vision changes.  Skin ulcers. Those with the trait may not have symptoms or they may have mild symptoms.  DIAGNOSIS  Sickle cell anemia is diagnosed with blood tests that demonstrate the genetic trait. It is often diagnosed during the newborn period, due to mandatory testing nationwide. A variety of blood tests, X-rays, CT scans, MRI scans, ultrasounds, and lung function tests may also be done to monitor the condition. TREATMENT  Sickle cell anemia may be treated with:  Medicines. You may be given pain medicines, antibiotic medicines (to treat and prevent infections) or medicines to increase the production of certain types of hemoglobin.  Fluids.  Oxygen.  Blood transfusions. HOME CARE INSTRUCTIONS   Drink enough fluid to keep your urine clear or pale yellow. Increase your fluid intake in hot weather and during exercise.  Do not smoke. Smoking lowers oxygen levels in the blood.   Only  take over-the-counter or prescription medicines for pain, fever, or discomfort as directed by your health care provider.  Take antibiotics as directed by your health care provider. Make sure you finish them it even if you start to feel better.   Take supplements as directed by your health care provider.   Consider wearing a medical alert bracelet. This tells anyone caring for you in an emergency of your condition.   When traveling, keep your medical information, health care provider's names, and the medicines you take with you at all times.   If you develop a fever, do not take medicines to reduce the fever right away. This could cover up a problem that is developing. Notify your health care provider.  Keep all follow-up appointments with your health care provider. Sickle cell anemia requires regular medical care. SEEK MEDICAL CARE IF: You have a fever. SEEK IMMEDIATE MEDICAL CARE IF:   You feel dizzy or faint.   You have new abdominal pain, especially on the left side near the stomach area.   You develop a persistent, often uncomfortable and painful penile erection (priapism). If this is not treated  immediately it will lead to impotence.   You have numbness your arms or legs or you have a hard time moving them.   You have a hard time with speech.   You have a fever or persistent symptoms for more than 2-3 days.   You have a fever and your symptoms suddenly get worse.   You have signs or symptoms of infection. These include:   Chills.   Abnormal tiredness (lethargy).   Irritability.   Poor eating.   Vomiting.   You develop pain that is not helped with medicine.   You develop shortness of breath.  You have pain in your chest.   You are coughing up pus-like or bloody sputum.   You develop a stiff neck.  Your feet or hands swell or have pain.  Your abdomen appears bloated.  You develop joint pain. MAKE SURE YOU:  Understand these  instructions.   This information is not intended to replace advice given to you by your health care provider. Make sure you discuss any questions you have with your health care provider.   Document Released: 01/15/2006 Document Revised: 10/28/2014 Document Reviewed: 05/19/2013 Elsevier Interactive Patient Education [employer]2016 Elsevier Inc.

## 2015-08-24 NOTE — ED Notes (Addendum)
Nurse accessing port 

## 2015-08-28 ENCOUNTER — Telehealth: Payer: Self-pay | Admitting: Pediatrics

## 2015-08-28 NOTE — Telephone Encounter (Signed)
Called patient to establish care. [facility].

## 2015-10-26 ENCOUNTER — Emergency Department (HOSPITAL_COMMUNITY): Payer: Medicaid Other

## 2015-10-26 ENCOUNTER — Encounter (HOSPITAL_COMMUNITY): Payer: Self-pay | Admitting: Emergency Medicine

## 2015-10-26 ENCOUNTER — Inpatient Hospital Stay (HOSPITAL_COMMUNITY)
Admission: EM | Admit: 2015-10-26 | Discharge: 2015-10-29 | DRG: 811 | Disposition: A | Discharge status: Home / Self Care | Payer: Medicaid Other | Attending: Internal Medicine | Admitting: Internal Medicine

## 2015-10-26 DIAGNOSIS — Z8673 Personal history of transient ischemic attack (TIA), and cerebral infarction without residual deficits: Secondary | ICD-10-CM

## 2015-10-26 DIAGNOSIS — G40909 Epilepsy, unspecified, not intractable, without status epilepticus: Secondary | ICD-10-CM | POA: Diagnosis present

## 2015-10-26 DIAGNOSIS — Z886 Allergy status to analgesic agent status: Secondary | ICD-10-CM

## 2015-10-26 DIAGNOSIS — J181 Lobar pneumonia, unspecified organism: Secondary | ICD-10-CM | POA: Diagnosis present

## 2015-10-26 DIAGNOSIS — Z91018 Allergy to other foods: Secondary | ICD-10-CM

## 2015-10-26 DIAGNOSIS — Z885 Allergy status to narcotic agent status: Secondary | ICD-10-CM

## 2015-10-26 DIAGNOSIS — Z888 Allergy status to other drugs, medicaments and biological substances status: Secondary | ICD-10-CM

## 2015-10-26 DIAGNOSIS — D649 Anemia, unspecified: Secondary | ICD-10-CM | POA: Diagnosis present

## 2015-10-26 DIAGNOSIS — Z79891 Long term (current) use of opiate analgesic: Secondary | ICD-10-CM

## 2015-10-26 DIAGNOSIS — Z830 Family history of human immunodeficiency virus [HIV] disease: Secondary | ICD-10-CM

## 2015-10-26 DIAGNOSIS — Z7982 Long term (current) use of aspirin: Secondary | ICD-10-CM

## 2015-10-26 DIAGNOSIS — Z79899 Other long term (current) drug therapy: Secondary | ICD-10-CM

## 2015-10-26 DIAGNOSIS — D5701 Hb-SS disease with acute chest syndrome: Secondary | ICD-10-CM | POA: Diagnosis present

## 2015-10-26 NOTE — ED Notes (Signed)
Went to sickle cell MD today and was told to come here for a possible blood transfusion d/t low HgB level. Pt states sickle cell pain 7/10 on all of right side, causing chest pain and SOB, no relief with her usual pain medication. Denies N/V/D, fever/chills.

## 2015-10-27 DIAGNOSIS — Z8673 Personal history of transient ischemic attack (TIA), and cerebral infarction without residual deficits: Secondary | ICD-10-CM | POA: Diagnosis not present

## 2015-10-27 DIAGNOSIS — Z885 Allergy status to narcotic agent status: Secondary | ICD-10-CM | POA: Diagnosis not present

## 2015-10-27 DIAGNOSIS — Z79891 Long term (current) use of opiate analgesic: Secondary | ICD-10-CM | POA: Diagnosis not present

## 2015-10-27 DIAGNOSIS — Z830 Family history of human immunodeficiency virus [HIV] disease: Secondary | ICD-10-CM | POA: Diagnosis not present

## 2015-10-27 DIAGNOSIS — R079 Chest pain, unspecified: Secondary | ICD-10-CM | POA: Diagnosis present

## 2015-10-27 DIAGNOSIS — Z91018 Allergy to other foods: Secondary | ICD-10-CM | POA: Diagnosis not present

## 2015-10-27 DIAGNOSIS — D5701 Hb-SS disease with acute chest syndrome: Secondary | ICD-10-CM | POA: Diagnosis present

## 2015-10-27 DIAGNOSIS — D649 Anemia, unspecified: Secondary | ICD-10-CM

## 2015-10-27 DIAGNOSIS — Z886 Allergy status to analgesic agent status: Secondary | ICD-10-CM | POA: Diagnosis not present

## 2015-10-27 DIAGNOSIS — Z888 Allergy status to other drugs, medicaments and biological substances status: Secondary | ICD-10-CM | POA: Diagnosis not present

## 2015-10-27 DIAGNOSIS — J181 Lobar pneumonia, unspecified organism: Secondary | ICD-10-CM | POA: Diagnosis present

## 2015-10-27 DIAGNOSIS — G40909 Epilepsy, unspecified, not intractable, without status epilepticus: Secondary | ICD-10-CM

## 2015-10-27 DIAGNOSIS — Z7982 Long term (current) use of aspirin: Secondary | ICD-10-CM | POA: Diagnosis not present

## 2015-10-27 DIAGNOSIS — D5720 Sickle-cell/Hb-C disease without crisis: Secondary | ICD-10-CM | POA: Insufficient documentation

## 2015-10-27 DIAGNOSIS — Z79899 Other long term (current) drug therapy: Secondary | ICD-10-CM | POA: Diagnosis not present

## 2015-10-27 LAB — CBC WITH DIFFERENTIAL/PLATELET
Basophils Absolute: 0 10*3/uL (ref 0.0–0.1)
Basophils Relative: 0 %
Eosinophils Absolute: 0.6 10*3/uL (ref 0.0–0.7)
Eosinophils Relative: 3 %
HCT: 19.1 % — ABNORMAL LOW (ref 36.0–46.0)
Hemoglobin: 6.3 g/dL — CL (ref 12.0–15.0)
Lymphocytes Relative: 19 %
Lymphs Abs: 3.6 10*3/uL (ref 0.7–4.0)
MCH: 31.5 pg (ref 26.0–34.0)
MCHC: 33 g/dL (ref 30.0–36.0)
MCV: 95.5 fL (ref 78.0–100.0)
Monocytes Absolute: 2.4 10*3/uL — ABNORMAL HIGH (ref 0.1–1.0)
Monocytes Relative: 13 %
Neutro Abs: 12.1 10*3/uL — ABNORMAL HIGH (ref 1.7–7.7)
Neutrophils Relative %: 65 %
Platelets: 907 10*3/uL (ref 150–400)
RBC: 2 MIL/uL — ABNORMAL LOW (ref 3.87–5.11)
RDW: 24.3 % — ABNORMAL HIGH (ref 11.5–15.5)
WBC: 18.7 10*3/uL — ABNORMAL HIGH (ref 4.0–10.5)

## 2015-10-27 LAB — RETICULOCYTES
RBC.: 2 MIL/uL — ABNORMAL LOW (ref 3.87–5.11)
Retic Ct Pct: >23 % — ABNORMAL HIGH (ref 0.4–3.1)

## 2015-10-27 LAB — POC URINE PREG, ED: Preg Test, Ur: NEGATIVE

## 2015-10-27 LAB — COMPREHENSIVE METABOLIC PANEL
ALT: 19 U/L (ref 14–54)
AST: 29 U/L (ref 15–41)
Albumin: 4.1 g/dL (ref 3.5–5.0)
Alkaline Phosphatase: 109 U/L (ref 38–126)
Anion gap: 7 (ref 5–15)
BUN: 5 mg/dL — ABNORMAL LOW (ref 6–20)
CO2: 28 mmol/L (ref 22–32)
Calcium: 8.8 mg/dL — ABNORMAL LOW (ref 8.9–10.3)
Chloride: 102 mmol/L (ref 101–111)
Creatinine, Ser: 0.53 mg/dL (ref 0.44–1.00)
GFR calc Af Amer: >60 mL/min (ref >60)
GFR calc non Af Amer: >60 mL/min (ref >60)
Glucose, Bld: 101 mg/dL — ABNORMAL HIGH (ref 65–99)
Potassium: 3.8 mmol/L (ref 3.5–5.1)
Sodium: 137 mmol/L (ref 135–145)
Total Bilirubin: 1.7 mg/dL — ABNORMAL HIGH (ref 0.3–1.2)
Total Protein: 8.2 g/dL — ABNORMAL HIGH (ref 6.5–8.1)

## 2015-10-27 LAB — HEMOGLOBIN AND HEMATOCRIT, BLOOD
HCT: 24.1 % — ABNORMAL LOW (ref 36.0–46.0)
Hemoglobin: 8.1 g/dL — ABNORMAL LOW (ref 12.0–15.0)

## 2015-10-27 LAB — TROPONIN I
Troponin I: <0.03 ng/mL (ref <0.031)
Troponin I: <0.03 ng/mL (ref <0.031)

## 2015-10-27 LAB — PREPARE RBC (CROSSMATCH)

## 2015-10-27 MED ORDER — SODIUM CHLORIDE 0.9 % IV SOLN: 10.0000 mL/h | Freq: Once | INTRAVENOUS | Status: DC

## 2015-10-27 MED ORDER — ALBUTEROL SULFATE (2.5 MG/3ML) 0.083% IN NEBU
2.5000 mg | INHALATION_SOLUTION | Freq: Four times a day (QID) | RESPIRATORY_TRACT | Status: DC
  Administered 2015-10-27: 2.5 mg via RESPIRATORY_TRACT
  Filled 2015-10-27: qty 3

## 2015-10-27 MED ORDER — NALOXONE HCL 0.4 MG/ML IJ SOLN
0.4000 mg | INTRAMUSCULAR | Status: DC | PRN
Start: 2015-10-27 — End: 2015-10-29

## 2015-10-27 MED ORDER — HYDROMORPHONE 1 MG/ML IV SOLN
INTRAVENOUS | Status: DC
  Administered 2015-10-27: 1 mg via INTRAVENOUS
  Administered 2015-10-27: 0.6 mg via INTRAVENOUS
  Administered 2015-10-27: 2.2 mg via INTRAVENOUS
  Administered 2015-10-27: 0.4 mg via INTRAVENOUS
  Administered 2015-10-28: 1.6 mg via INTRAVENOUS
  Administered 2015-10-28: 1.2 mg via INTRAVENOUS
  Administered 2015-10-28: 3.4 mg via INTRAVENOUS
  Administered 2015-10-29: 2.1 mg via INTRAVENOUS
  Administered 2015-10-29: 2.4 mg via INTRAVENOUS
  Administered 2015-10-29: 0.9 mg via INTRAVENOUS
  Administered 2015-10-29: 3 mg via INTRAVENOUS
  Administered 2015-10-29: 0.9 mg via INTRAVENOUS

## 2015-10-27 MED ORDER — FENTANYL CITRATE (PF) 100 MCG/2ML IJ SOLN: 50.0000 ug | Freq: Once | INTRAMUSCULAR | Status: DC

## 2015-10-27 MED ORDER — SODIUM CHLORIDE 0.9 % IV SOLN
Freq: Once | INTRAVENOUS | Status: AC
  Administered 2015-10-27: 02:00:00 via INTRAVENOUS

## 2015-10-27 MED ORDER — NALOXONE HCL 0.4 MG/ML IJ SOLN: 0.4000 mg | INTRAMUSCULAR | Status: DC | PRN

## 2015-10-27 MED ORDER — DIPHENHYDRAMINE HCL 12.5 MG/5ML PO ELIX: 12.5000 mg | ORAL_SOLUTION | Freq: Four times a day (QID) | ORAL | Status: DC | PRN

## 2015-10-27 MED ORDER — ASPIRIN EC 81 MG PO TBEC
81.0000 mg | DELAYED_RELEASE_TABLET | Freq: Every day | ORAL | Status: DC
  Administered 2015-10-27 – 2015-10-29 (×3): 81 mg via ORAL
  Filled 2015-10-27 (×3): qty 1

## 2015-10-27 MED ORDER — FOLIC ACID 1 MG PO TABS
1.0000 mg | ORAL_TABLET | Freq: Every day | ORAL | Status: DC
  Administered 2015-10-27 – 2015-10-29 (×3): 1 mg via ORAL
  Filled 2015-10-27 (×3): qty 1

## 2015-10-27 MED ORDER — DIPHENHYDRAMINE HCL 50 MG/ML IJ SOLN
25.0000 mg | Freq: Once | INTRAMUSCULAR | Status: AC
  Administered 2015-10-27: 25 mg via INTRAVENOUS
  Filled 2015-10-27: qty 1

## 2015-10-27 MED ORDER — HYDROMORPHONE HCL 2 MG/ML IJ SOLN
2.0000 mg | INTRAMUSCULAR | Status: DC
  Administered 2015-10-27 (×2): 2 mg via INTRAVENOUS
  Filled 2015-10-27 (×2): qty 1

## 2015-10-27 MED ORDER — PROMETHAZINE HCL 25 MG/ML IJ SOLN: 12.5000 mg | INTRAMUSCULAR | Status: DC | PRN

## 2015-10-27 MED ORDER — DIPHENHYDRAMINE HCL 50 MG/ML IJ SOLN
12.5000 mg | Freq: Four times a day (QID) | INTRAMUSCULAR | Status: DC | PRN
  Administered 2015-10-27 – 2015-10-29 (×6): 12.5 mg via INTRAVENOUS
  Administered 2015-10-29 (×2): 25 mg via INTRAVENOUS
  Filled 2015-10-27 (×7): qty 1

## 2015-10-27 MED ORDER — VANCOMYCIN HCL IN DEXTROSE 1-5 GM/200ML-% IV SOLN
1000.0000 mg | Freq: Once | INTRAVENOUS | Status: AC
  Administered 2015-10-27: 1000 mg via INTRAVENOUS
  Filled 2015-10-27: qty 200

## 2015-10-27 MED ORDER — LAMOTRIGINE 200 MG PO TABS
200.0000 mg | ORAL_TABLET | Freq: Two times a day (BID) | ORAL | Status: DC
  Administered 2015-10-27 – 2015-10-29 (×5): 200 mg via ORAL
  Filled 2015-10-27 (×6): qty 1

## 2015-10-27 MED ORDER — DEXTROSE 5 % IV SOLN
2.0000 g | Freq: Three times a day (TID) | INTRAVENOUS | Status: DC
  Administered 2015-10-27 – 2015-10-28 (×5): 2 g via INTRAVENOUS
  Filled 2015-10-27 (×6): qty 2

## 2015-10-27 MED ORDER — SODIUM CHLORIDE 0.9 % IJ SOLN: 9.0000 mL | INTRAMUSCULAR | Status: DC | PRN

## 2015-10-27 MED ORDER — METHADONE HCL 5 MG PO TABS
5.0000 mg | ORAL_TABLET | Freq: Two times a day (BID) | ORAL | Status: DC
  Administered 2015-10-27 – 2015-10-28 (×2): 5 mg via ORAL
  Filled 2015-10-27 (×3): qty 1

## 2015-10-27 MED ORDER — PROMETHAZINE HCL 25 MG PO TABS: 25.0000 mg | ORAL_TABLET | Freq: Four times a day (QID) | ORAL | Status: DC | PRN

## 2015-10-27 MED ORDER — VITAMINS A & D EX OINT
TOPICAL_OINTMENT | CUTANEOUS | Status: AC
  Administered 2015-10-27: 05:00:00
  Filled 2015-10-27: qty 5

## 2015-10-27 MED ORDER — SODIUM CHLORIDE 0.9 % IV SOLN
INTRAVENOUS | Status: DC
  Administered 2015-10-27 – 2015-10-29 (×5): via INTRAVENOUS

## 2015-10-27 MED ORDER — IPRATROPIUM BROMIDE 0.02 % IN SOLN
0.5000 mg | Freq: Four times a day (QID) | RESPIRATORY_TRACT | Status: DC
  Administered 2015-10-27: 0.5 mg via RESPIRATORY_TRACT
  Filled 2015-10-27: qty 2.5

## 2015-10-27 MED ORDER — PROMETHAZINE HCL 25 MG/ML IJ SOLN
12.5000 mg | Freq: Once | INTRAMUSCULAR | Status: AC
  Administered 2015-10-27: 12.5 mg via INTRAVENOUS
  Filled 2015-10-27: qty 1

## 2015-10-27 MED ORDER — ACETAMINOPHEN 650 MG RE SUPP: 650.0000 mg | Freq: Four times a day (QID) | RECTAL | Status: DC | PRN

## 2015-10-27 MED ORDER — LEVETIRACETAM 500 MG PO TABS
1000.0000 mg | ORAL_TABLET | Freq: Once | ORAL | Status: AC
  Administered 2015-10-27: 1000 mg via ORAL
  Filled 2015-10-27: qty 2

## 2015-10-27 MED ORDER — ACETAMINOPHEN 325 MG PO TABS: 650.0000 mg | ORAL_TABLET | Freq: Four times a day (QID) | ORAL | Status: DC | PRN

## 2015-10-27 MED ORDER — FENTANYL CITRATE (PF) 100 MCG/2ML IJ SOLN
50.0000 ug | Freq: Once | INTRAMUSCULAR | Status: AC
  Administered 2015-10-27: 50 ug via INTRAVENOUS
  Filled 2015-10-27: qty 2

## 2015-10-27 MED ORDER — ONDANSETRON HCL 4 MG/2ML IJ SOLN
4.0000 mg | Freq: Four times a day (QID) | INTRAMUSCULAR | Status: DC | PRN
  Administered 2015-10-28 – 2015-10-29 (×2): 4 mg via INTRAVENOUS
  Filled 2015-10-27 (×2): qty 2

## 2015-10-27 MED ORDER — SODIUM CHLORIDE 0.9 % IJ SOLN
10.0000 mL | INTRAMUSCULAR | Status: DC | PRN
  Administered 2015-10-28 – 2015-10-29 (×3): 10 mL
  Filled 2015-10-27 (×3): qty 40

## 2015-10-27 MED ORDER — HYDROMORPHONE 1 MG/ML IV SOLN
INTRAVENOUS | Status: DC
Start: 2015-10-27 — End: 2015-10-27
  Administered 2015-10-27: 05:00:00 via INTRAVENOUS
  Administered 2015-10-27: 1.7 mg via INTRAVENOUS
  Filled 2015-10-27: qty 25

## 2015-10-27 MED ORDER — LAMOTRIGINE 200 MG PO TABS
200.0000 mg | ORAL_TABLET | Freq: Once | ORAL | Status: AC
  Administered 2015-10-27: 200 mg via ORAL
  Filled 2015-10-27: qty 1

## 2015-10-27 MED ORDER — ENOXAPARIN SODIUM 40 MG/0.4ML ~~LOC~~ SOLN
40.0000 mg | SUBCUTANEOUS | Status: DC
  Administered 2015-10-27 – 2015-10-29 (×3): 40 mg via SUBCUTANEOUS
  Filled 2015-10-27 (×3): qty 0.4

## 2015-10-27 MED ORDER — LEVETIRACETAM 500 MG PO TABS
1000.0000 mg | ORAL_TABLET | Freq: Every day | ORAL | Status: DC
  Administered 2015-10-27 – 2015-10-28 (×2): 1000 mg via ORAL
  Filled 2015-10-27 (×3): qty 2

## 2015-10-27 MED ORDER — SODIUM CHLORIDE 0.9 % IJ SOLN
3.0000 mL | Freq: Two times a day (BID) | INTRAMUSCULAR | Status: DC
  Administered 2015-10-27: 3 mL via INTRAVENOUS

## 2015-10-27 MED ORDER — DIPHENHYDRAMINE HCL 50 MG/ML IJ SOLN
12.5000 mg | Freq: Once | INTRAMUSCULAR | Status: AC
  Administered 2015-10-27: 12.5 mg via INTRAVENOUS
  Filled 2015-10-27: qty 1

## 2015-10-27 MED ORDER — DIPHENHYDRAMINE HCL 50 MG/ML IJ SOLN: 12.5000 mg | Freq: Four times a day (QID) | INTRAMUSCULAR | Status: DC | PRN

## 2015-10-27 MED ORDER — DIPHENHYDRAMINE HCL 12.5 MG/5ML PO ELIX
12.5000 mg | ORAL_SOLUTION | Freq: Four times a day (QID) | ORAL | Status: DC | PRN
  Administered 2015-10-27: 12.5 mg via ORAL
  Filled 2015-10-27: qty 5

## 2015-10-27 MED ORDER — ZOLPIDEM TARTRATE 5 MG PO TABS
5.0000 mg | ORAL_TABLET | Freq: Every evening | ORAL | Status: DC | PRN
  Administered 2015-10-28: 5 mg via ORAL
  Filled 2015-10-27: qty 1

## 2015-10-27 MED ORDER — IPRATROPIUM-ALBUTEROL 0.5-2.5 (3) MG/3ML IN SOLN
3.0000 mL | Freq: Four times a day (QID) | RESPIRATORY_TRACT | Status: DC
  Administered 2015-10-27: 3 mL via RESPIRATORY_TRACT
  Filled 2015-10-27: qty 3

## 2015-10-27 MED ORDER — VANCOMYCIN HCL IN DEXTROSE 750-5 MG/150ML-% IV SOLN
750.0000 mg | Freq: Three times a day (TID) | INTRAVENOUS | Status: DC
  Administered 2015-10-27 – 2015-10-28 (×3): 750 mg via INTRAVENOUS
  Filled 2015-10-27 (×5): qty 150

## 2015-10-27 MED ORDER — ALBUTEROL SULFATE (2.5 MG/3ML) 0.083% IN NEBU: 2.5000 mg | INHALATION_SOLUTION | RESPIRATORY_TRACT | Status: DC | PRN

## 2015-10-27 MED ORDER — IPRATROPIUM-ALBUTEROL 0.5-2.5 (3) MG/3ML IN SOLN
3.0000 mL | Freq: Three times a day (TID) | RESPIRATORY_TRACT | Status: DC
  Administered 2015-10-27 – 2015-10-28 (×3): 3 mL via RESPIRATORY_TRACT
  Filled 2015-10-27 (×3): qty 3

## 2015-10-27 MED ORDER — DEXTROSE 5 % IV SOLN
2.0000 g | Freq: Once | INTRAVENOUS | Status: AC
  Administered 2015-10-27: 2 g via INTRAVENOUS
  Filled 2015-10-27: qty 2

## 2015-10-27 NOTE — ED Notes (Signed)
Critical platelet count of 907. Nurse made aware of same.

## 2015-10-27 NOTE — Care Management Note (Signed)
Case Management Note  Patient Details  Name: Gloriajean DellShanequa Rude MRN: [id number] Date of Birth: 1981/01/06  Subjective/Objective:    SSC                Action/Plan:  Home with no needs.  Expected Discharge Date:                  Expected Discharge Plan:  Home/Self Care  In-House Referral:     Discharge planning Services  CM Consult  Post Acute Care Choice:    Choice offered to:     DME Arranged:    DME Agency:     HH Arranged:    HH Agency:     Status of Service:  Completed, signed off  Medicare Important Message Given:    Date Medicare IM Given:    Medicare IM give by:    Date Additional Medicare IM Given:    Additional Medicare Important Message give by:     If discussed at Long Length of Stay Meetings, dates discussed:    Additional CommentsGeni Bers:  Christasia Angeletti, RN 10/27/2015, 4:12 PM

## 2015-10-27 NOTE — Progress Notes (Signed)
ANTIBIOTIC CONSULT NOTE - INITIAL  Pharmacy Consult for Vancomycin, cefepime  Indication: pneumonia  Allergies  Allergen Reactions  . Ibuprofen Hives  . Zofran [Ondansetron Hcl] Hives and Itching  . Demerol [Meperidine] Other (See Comments)    Pt has seizures  . Fentanyl And Related Nausea And Vomiting and Other (See Comments)    Very sedated  **Not allergic to the injection** JUST ALLERGIC TO PATCH  . Other Itching    Greek Yogurt  . Codeine Hives and Itching  . Hydrocodone Hives and Itching    Patient Measurements: Height: 5\' 4"  (162.6 cm) Weight: 161 lb (73.029 kg) IBW/kg (Calculated) : 54.7 Adjusted Body Weight:   Vital Signs: Temp: 99.5 F (37.5 C) (01/06 0416) Temp Source: Oral (01/06 0416) BP: 125/65 mmHg (01/06 0416) Pulse Rate: 115 (01/06 0416) Intake/Output from previous day:   Intake/Output from this shift:    Labs:  Recent Labs  10/27/15 0104  WBC 18.7*  HGB 6.3*  PLT 907*  CREATININE 0.53   Estimated Creatinine Clearance: 95.2 mL/min (by C-G formula based on Cr of 0.53). No results for input(s): VANCOTROUGH, VANCOPEAK, VANCORANDOM, GENTTROUGH, GENTPEAK, GENTRANDOM, TOBRATROUGH, TOBRAPEAK, TOBRARND, AMIKACINPEAK, AMIKACINTROU, AMIKACIN in the last 72 hours.   Microbiology: No results found for this or any previous visit (from the past 720 hour(s)).  Medical History: Past Medical History  Diagnosis Date  . Sickle cell disease (HCC)   . Seizures (HCC)     one time incident, may have been r/t to a pain medication she was prescribed  . Anemia   . Blood transfusion without reported diagnosis   . Stroke [facility](HCC)     Medications:  Anti-infectives    Start     Dose/Rate Route Frequency Ordered Stop   10/27/15 1400  vancomycin (VANCOCIN) IVPB 750 mg/150 ml premix     750 mg 150 mL/hr over 60 Minutes Intravenous Every 8 hours 10/27/15 0427     10/27/15 0800  ceFEPIme (MAXIPIME) 2 g in dextrose 5 % 50 mL IVPB     2 g 100 mL/hr over 30 Minutes  Intravenous 3 times per day 10/27/15 0427     10/27/15 0200  ceFEPIme (MAXIPIME) 2 g in dextrose 5 % 50 mL IVPB     2 g 100 mL/hr over 30 Minutes Intravenous  Once 10/27/15 0145 10/27/15 0310   10/27/15 0200  vancomycin (VANCOCIN) IVPB 1000 mg/200 mL premix     1,000 mg 200 mL/hr over 60 Minutes Intravenous  Once 10/27/15 0155       Assessment: Sickle cell patient in ED with chest pain.  Pharmacy to dose for PNA.    Goal of Therapy:  Vancomycin trough level 15-20 mcg/ml  Cefepime dosed based on patient weight and renal function   Plan:  Measure antibiotic drug levels at steady state Follow up culture results Vancomycin 750mg  iv q8hr  Cefepime 2gm iv q8hr  Darlina GuysGrimsley Jr, Kaelah Hayashi Crowford 10/27/2015,4:30 AM

## 2015-10-27 NOTE — Progress Notes (Addendum)
Patient ID: Debra Williamson, female   DOB: 09/04/1981, 35 y.o.   MRN: [id number]  Patient admitted after midnight. Please refer to admission note done 10/27/2015.  35 year old female with a past medical history significant for sickle cell disease, strokes, seizure disorder who [facility] right-sided chest pain. She has seen her primary care physician about 2 days prior to this admission and the blood work was obtained at that time. She was later notified that her hemoglobin was down to 6.3 and her baseline is anywhere from 7.5-8. Her chest pain was constant, right-sided and associated with deep breathing. Pain medications at home did not provide significant symptomatic relief. There was no associated fevers or chills. On admission, patient was hemodynamically stable. Her oxygen saturation was 87% on room air but has improved to 94% with nasal cannula oxygen support. Temp was 99.22F. Blood work was notable for leukocytosis of 18.7, hemoglobin 6.3, platelets 907. Chest x-ray demonstrated patchy right basilar opacity concerning for acute chest syndrome and there was a streaky linear opacities on the left side likely representing atelectasis or additional acute chest process.  Physical exam: Cardiovascular: Rate control, appreciate murmur in left upper sternal border, +3 out of 6 Pulmonary: Rhonchi appreciated on right mid and lower lung as well as left lower lung, no wheezing   Assessment and plan:  Acute sickle cell anemia with crisis / acute chest syndrome - Patient presented with right-sided chest pain. Chest x-ray on admission showed patchy right basilar opacities concerning for acute chest syndrome. There was also left lung base opacity which could represent atelectasis versus new acute chest process - Chest pain currently is 5 out of 10 - Pain is well controlled with PCA Dilaudid - We will cycle cardiac enzymes - Getting blood transfusion (order was placed in ED for 2  units PRBC) - Continue IV fluids - Continue aspirin daily  Lobar pneumonia, unspecified organism - Started on broad-spectrum antibiotics, vancomycin and cefepime - Blood cultures were not obtained at the time of the admission. Patient is already on antibiotics so we will be collecting blood cultures if patient spikes fever   Manson Passeylma Cayleen Benjamin [facility] [phone]

## 2015-10-27 NOTE — H&P (Signed)
[facility] History and Physical  Debra Williamson ZOX:[id number] DOB: 1981/07/09 DOA: 10/26/2015  Referring physician: ED PCP: [facility]   Chief Complaint: Low blood counts and Right-sided chest pain  HPI:  [name] is a 35 year old female with a past medical history significant for sickle cell disease, strokes, seizure disorder; who presents to the emergency department complaining of low hemoglobin and right-sided chest pain. She has recently gone to see her primary care doctor at [facility] approximately 2 days ago. At that appointment they had drawn lab work. She had noted some left-sided crampy pain during this time, but dismissed. The following day she woke up with right-sided chest pain that she noted to be achy/dull in nature. Pain is a 7 out of 10 on the pain scale. Symptoms were constant and worsened with deep breaths in. Patient noted to take her normal pain medications without relief of symptoms. Later that day around 9 PM she was notified by her doctor office that her hemoglobin had dropped to 6.3 and it seems she normally lives around 7.5-8. Patient notes associated symptoms of shortness of breath, but this is not changed from her baseline. Denies any fever, chills, nausea, vomiting, or diarrhea. patient was just recently admitted back in October of 2016 for similar symptoms and require subsequent transfer to [facility] for intensive care.  Upon admission to the emergency department some of the patient's labs and vital signs included a hemoglobin 6.3, WBC 18.7, HR 102, O2 sats 94% on room air. Chest x-ray showed a patchy right basilar infiltrate as well as linear streaky opacities on the left lower lung base which could represent atelectasis, scarring, or acute process .  Review of Systems  Constitutional: Positive for malaise/fatigue. Negative for fever, chills and diaphoresis.  HENT: Negative for ear discharge and ear pain.    Eyes: Negative for double vision and photophobia.  Respiratory: Positive for shortness of breath. Negative for hemoptysis and sputum production.   Cardiovascular: Positive for chest pain. Negative for leg swelling.       Chest tightness  Gastrointestinal: Negative for nausea, vomiting and abdominal pain.  Genitourinary: Negative for urgency and frequency.  Musculoskeletal: Negative for falls and neck pain.  Skin: Negative for itching and rash.  Neurological: Negative for sensory change and seizures.  Endo/Heme/Allergies: Negative for environmental allergies and polydipsia.  Psychiatric/Behavioral: Negative for hallucinations. The patient is not nervous/anxious.      Past Medical History  Diagnosis Date  . Sickle cell disease (HCC)   . Seizures (HCC)     one time incident, may have been r/t to a pain medication she was prescribed  . Anemia   . Blood transfusion without reported diagnosis   . Stroke [facility])      Past Surgical History  Procedure Laterality Date  . Cholecystectomy    . Cesarean section      x4  . Reverse tubal ligation    . Portacath placement Right w-3      Social History:  reports that she has never smoked. She has never used smokeless tobacco. She reports that she drinks alcohol. She reports that she does not use illicit drugs. Where does patient live--home and with whom if at home? family Can patient participate in ADLs? Yes  Allergies  Allergen Reactions  . Ibuprofen Hives  . Zofran [Ondansetron Hcl] Hives and Itching  . Demerol [Meperidine] Other (See Comments)    Pt has seizures  . Fentanyl And Related  Nausea And Vomiting and Other (See Comments)    Very sedated  **Not allergic to the injection** JUST ALLERGIC TO PATCH  . Other Itching    Greek Yogurt  . Codeine Hives and Itching  . Hydrocodone Hives and Itching    Family History  Problem Relation Age of Onset  . HIV/AIDS Father        Prior to Admission medications   Medication Sig  Start Date End Date Taking? Authorizing Provider  aspirin EC 81 MG tablet Take 1 tablet by mouth daily. 11/03/13  Yes Historical Provider, MD  folic acid (FOLVITE) 1 MG tablet Take 1 mg by mouth daily.  02/13/15  Yes Historical Provider, MD  lamoTRIgine (LAMICTAL) 200 MG tablet Take 1 tablet by mouth 2 (two) times daily. 02/13/15  Yes Historical Provider, MD  levETIRAcetam (KEPPRA) 1000 MG tablet Take 1 tablet by mouth at bedtime.  02/13/15  Yes Historical Provider, MD  methadone (DOLOPHINE) 5 MG tablet Take 1 tablet by mouth 2 times daily at 12 noon and 4 pm. 09/22/15  Yes Historical Provider, MD  Oxycodone HCl 10 MG TABS Take 1 tablet (10 mg total) by mouth every 4 (four) hours as needed (pain). 08/14/15  Yes Altha Harm, MD  OxyCODONE HCl ER 30 MG T12A Take 1 tablet by mouth every 12 (twelve) hours. 08/14/15  Yes Altha Harm, MD  promethazine (PHENERGAN) 25 MG tablet Take 1 tablet by mouth every 8 (eight) hours as needed. n/v 02/13/15  Yes Historical Provider, MD  zolpidem (AMBIEN) 5 MG tablet Take 1 tablet (5 mg total) by mouth at bedtime as needed. sleep 03/13/15  Yes Altha Harm, MD  levofloxacin (LEVAQUIN) 750 MG tablet Take 1 tablet (750 mg total) by mouth daily. Patient not taking: Reported on 08/24/2015 08/14/15   Altha Harm, MD     Physical Exam: Filed Vitals:   10/26/15 2247 10/27/15 0200 10/27/15 0201  BP: 138/74  118/58  Pulse: 111  102  Temp: 99.2 F (37.3 C)  99.7 F (37.6 C)  TempSrc: Oral  Oral  Resp: 18  20  Height:  5\' 4"  (1.626 m)   Weight:  73.029 kg (161 lb)   SpO2: 94%  94%     Constitutional: Vital signs reviewed. Patient is a well-developed and well-nourished in no acute distress and cooperative with exam. Alert and oriented x3.  Head: Normocephalic and atraumatic  Ear: TM normal bilaterally  Mouth: no erythema or exudates, MMM  Eyes: PERRL, EOMI, conjunctivae normal, No scleral icterus.  Neck: Supple, Trachea midline normal ROM,  No JVD, mass, thyromegaly, or carotid bruit present.  Cardiovascular:Tachycardic, S1 normal, S2 normal, no MRG, pulses symmetric and intact bilaterally  Pulmonary/Chest:  Positive rales of the right lower lung field  Abdominal: Soft. Non-tender, non-distended, bowel sounds are normal, no masses, organomegaly, or guarding present.  GU: no CVA tenderness Musculoskeletal: No joint deformities, erythema, or stiffness, ROM full and no nontender Ext: no edema and no cyanosis, pulses palpable bilaterally (DP and PT)  Hematology: no cervical, inginal, or axillary adenopathy.  Neurological: A&O x3, Strenght is normal and symmetric bilaterally, cranial nerve II-XII are grossly intact, no focal motor deficit, sensory intact to light touch bilaterally.  Skin: Warm, dry and intact. No rash, cyanosis, or clubbing.  Psychiatric: Normal mood and affect. speech and behavior is normal. Judgment and thought content normal. Cognition and memory are normal.      Data Review   Micro Results No results found for  this or any previous visit (from the past 240 hour(s)).  Radiology Reports Dg Chest 2 View  10/26/2015  CLINICAL DATA:  Sickle cell patient with shortness of breath and fever. Right-sided pain. EXAM: CHEST  2 VIEW COMPARISON:  08/11/2015 FINDINGS: Tip of the right chest port in the mid SVC. Patchy right basilar opacity seen. Linear opacities at the left lung base. The heart is normal in size. There is no pulmonary edema. Question small right pleural effusion. No pneumothorax. IMPRESSION: 1. Patchy right basilar opacity concerning for acute chest syndrome. Possible small right pleural effusion. 2. Streaky/ linear opacities at the left lung base may be atelectasis, scarring, or additional acute chest process. Electronically Signed   By: Rubye OaksMelanie  Ehinger M.D.   On: 10/26/2015 23:42     CBC  Recent Labs Lab 10/27/15 0104  WBC 18.7*  HGB 6.3*  HCT 19.1*  PLT 907*  MCV 95.5  MCH 31.5  MCHC 33.0  RDW  24.3*  LYMPHSABS 3.6  MONOABS 2.4*  EOSABS 0.6  BASOSABS 0.0    Chemistries   Recent Labs Lab 10/27/15 0104  NA 137  K 3.8  CL 102  CO2 28  GLUCOSE 101*  BUN 5*  CREATININE 0.53  CALCIUM 8.8*  AST 29  ALT 19  ALKPHOS 109  BILITOT 1.7*   ------------------------------------------------------------------------------------------------------------------ estimated creatinine clearance is 95.2 mL/min (by C-G formula based on Cr of 0.53). ------------------------------------------------------------------------------------------------------------------ No results for input(s): HGBA1C in the last 72 hours. ------------------------------------------------------------------------------------------------------------------ No results for input(s): CHOL, HDL, LDLCALC, TRIG, CHOLHDL, LDLDIRECT in the last 72 hours. ------------------------------------------------------------------------------------------------------------------ No results for input(s): TSH, T4TOTAL, T3FREE, THYROIDAB in the last 72 hours.  Invalid input(s): FREET3 ------------------------------------------------------------------------------------------------------------------  Recent Labs  10/27/15 0104  RETICCTPCT >23.0*    Coagulation profile No results for input(s): INR, PROTIME in the last 168 hours.  No results for input(s): DDIMER in the last 72 hours.  Cardiac Enzymes No results for input(s): CKMB, TROPONINI, MYOGLOBIN in the last 168 hours.  Invalid input(s): CK ------------------------------------------------------------------------------------------------------------------ Invalid input(s): POCBNP   CBG: No results for input(s): GLUCAP in the last 168 hours.     EKG: Independently reviewed. Sinus tachycardia   Assessment/Plan Principal Problem:  Hb-SS disease with acute chest syndrome [facility](HCC): Patient presents with complaints of right-sided chest pain as well as hemoglobin being low at 6.3  g/dL. Chest x-ray showing a acute right basilar opacity. - Admit to telemetry bed - IV fluids normal saline at 125 ml per hour - Nasal cannula oxygen if needed to keep O2 sat >92%. Continuous pulse oximetry - PCA  diluadid for pain control - Empiric antibiotics cefepime and vancomycin for possible infectious process - Incentive spirometry - DuoNeb's scheduled and prn  Symptomatic anemia: Given patient's history of sickle cell disease her hemoglobin normally is somewhere between 7.5-8 g/dL. Acutely low on admission at 6.3  - T&S  - Transfuse 1 unit of blood now  Seizure disorder -Continue Keppra and Lamictal  H/O: stroke -  Continue Aspirin      Code Status:   full Family Communication: bedside Disposition Plan: admit   Total time spent 55 minutes.Greater than 50% of this time was spent in counseling, explanation of diagnosis, planning of further management, and coordination of care  Clydie BraunRondell A Smith [facility] Pager [phone]  If 7PM-7AM, please contact night-coverage [email or web address] Password [email or web address] 10/27/2015, 2:57 AM

## 2015-10-27 NOTE — ED Provider Notes (Signed)
CSN: [id number]     Arrival date & time 10/26/15  2216 History   First MD Initiated Contact with Patient 10/27/15 0132     Chief Complaint  Patient presents with  . Sickle Cell Pain Crisis  . Chest Pain  . Shortness of Breath     (Consider location/radiation/quality/duration/timing/severity/associated sxs/prior Treatment) HPI Debra Williamson is a 35 y.o. female with sickle cell disease, history of strokes, seizures, presents to emergency department complaining of right chest pain, low hemoglobin. Patient states she had a regular office appointment with her primary care doctor 2 days ago. She states that she at that time had mild pain to the right side which she mentioned to her doctor but because it was not severe it was not addressed. Patient states that she had blood work done at that time. Today she was called and told that her hemoglobin was low. She states that her pain in the side also worsened. It was not relieved with her medications at home. She denies any known fever or chills. She reports some pain with breathing. Denies shortness of breath. Denies cough. Denies any other pain or associated symptoms.  Past Medical History  Diagnosis Date  . Sickle cell disease (HCC)   . Seizures (HCC)     one time incident, may have been r/t to a pain medication she was prescribed  . Anemia   . Blood transfusion without reported diagnosis   . Stroke [facility])    Past Surgical History  Procedure Laterality Date  . Cholecystectomy    . Cesarean section      x4  . Reverse tubal ligation    . Portacath placement Right w-3   Family History  Problem Relation Age of Onset  . HIV/AIDS Father    Social History  Substance Use Topics  . Smoking status: Never Smoker   . Smokeless tobacco: Never Used  . Alcohol Use: Yes     Comment: occasionally   OB History    No data available     Review of Systems  Constitutional: Positive for fatigue. Negative for fever and chills.  Respiratory:  Positive for chest tightness and shortness of breath. Negative for cough.   Cardiovascular: Positive for chest pain. Negative for palpitations and leg swelling.  Gastrointestinal: Negative for nausea, vomiting, abdominal pain and diarrhea.  Genitourinary: Negative for dysuria and flank pain.  Musculoskeletal: Negative for myalgias, arthralgias, neck pain and neck stiffness.  Skin: Negative for rash.  Neurological: Positive for weakness. Negative for dizziness and headaches.  All other systems reviewed and are negative.     Allergies  Ibuprofen; Zofran; Demerol; Fentanyl and related; Other; Codeine; and Hydrocodone  Home Medications   Prior to Admission medications   Medication Sig Start Date End Date Taking? Authorizing Provider  aspirin EC 81 MG tablet Take 1 tablet by mouth daily. 11/03/13  Yes Historical Provider, MD  folic acid (FOLVITE) 1 MG tablet Take 1 mg by mouth daily.  02/13/15  Yes Historical Provider, MD  lamoTRIgine (LAMICTAL) 200 MG tablet Take 1 tablet by mouth 2 (two) times daily. 02/13/15  Yes Historical Provider, MD  levETIRAcetam (KEPPRA) 1000 MG tablet Take 1 tablet by mouth at bedtime.  02/13/15  Yes Historical Provider, MD  methadone (DOLOPHINE) 5 MG tablet Take 1 tablet by mouth 2 times daily at 12 noon and 4 pm. 09/22/15  Yes Historical Provider, MD  Oxycodone HCl 10 MG TABS Take 1 tablet (10 mg total) by mouth every 4 (four) hours as  needed (pain). 08/14/15  Yes Altha Harm, MD  OxyCODONE HCl ER 30 MG T12A Take 1 tablet by mouth every 12 (twelve) hours. 08/14/15  Yes Altha Harm, MD  promethazine (PHENERGAN) 25 MG tablet Take 1 tablet by mouth every 8 (eight) hours as needed. n/v 02/13/15  Yes Historical Provider, MD  zolpidem (AMBIEN) 5 MG tablet Take 1 tablet (5 mg total) by mouth at bedtime as needed. sleep 03/13/15  Yes Altha Harm, MD  levofloxacin (LEVAQUIN) 750 MG tablet Take 1 tablet (750 mg total) by mouth daily. Patient not taking:  Reported on 08/24/2015 08/14/15   Altha Harm, MD   BP 138/74 mmHg  Pulse 111  Temp(Src) 99.2 F (37.3 C) (Oral)  Resp 18  Ht 5\' 4"  (1.626 m)  Wt 73.029 kg  BMI 27.62 kg/m2  SpO2 94%  LMP 10/24/2015 Physical Exam  Constitutional: She is oriented to person, place, and time. She appears well-developed and well-nourished. No distress.  HENT:  Head: Normocephalic.  Eyes: Conjunctivae are normal.  Neck: Neck supple.  Cardiovascular: Normal rate, regular rhythm and normal heart sounds.   Pulmonary/Chest: Effort normal. No respiratory distress. She has no wheezes. She has rales.  Rales at right lower base  Abdominal: Soft. Bowel sounds are normal. She exhibits no distension. There is no tenderness. There is no rebound.  Musculoskeletal: She exhibits no edema.  Neurological: She is alert and oriented to person, place, and time.  Skin: Skin is warm and dry.  Psychiatric: She has a normal mood and affect. Her behavior is normal.  Nursing note and vitals reviewed.   ED Course  Procedures (including critical care time) Labs Review Labs Reviewed  COMPREHENSIVE METABOLIC PANEL - Abnormal; Notable for the following:    Glucose, Bld 101 (*)    BUN 5 (*)    Calcium 8.8 (*)    Total Protein 8.2 (*)    Total Bilirubin 1.7 (*)    All other components within normal limits  CBC WITH DIFFERENTIAL/PLATELET - Abnormal; Notable for the following:    WBC 18.7 (*)    RBC 2.00 (*)    Hemoglobin 6.3 (*)    HCT 19.1 (*)    RDW 24.3 (*)    Platelets 907 (*)    Neutro Abs 12.1 (*)    Monocytes Absolute 2.4 (*)    All other components within normal limits  RETICULOCYTES - Abnormal; Notable for the following:    Retic Ct Pct >23.0 (*)    RBC. 2.00 (*)    All other components within normal limits  PATHOLOGIST SMEAR REVIEW  POC URINE PREG, ED    Imaging Review Dg Chest 2 View  10/26/2015  CLINICAL DATA:  Sickle cell patient with shortness of breath and fever. Right-sided pain. EXAM:  CHEST  2 VIEW COMPARISON:  08/11/2015 FINDINGS: Tip of the right chest port in the mid SVC. Patchy right basilar opacity seen. Linear opacities at the left lung base. The heart is normal in size. There is no pulmonary edema. Question small right pleural effusion. No pneumothorax. IMPRESSION: 1. Patchy right basilar opacity concerning for acute chest syndrome. Possible small right pleural effusion. 2. Streaky/ linear opacities at the left lung base may be atelectasis, scarring, or additional acute chest process. Electronically Signed   By: Rubye Oaks M.D.   On: 10/26/2015 23:42   I have personally reviewed and evaluated these images and lab results as part of my medical decision-making.   EKG Interpretation None  MDM   Final diagnoses:  Acute chest syndrome [facility](HCC)    Patient with hemoglobin of 6.3, chest x-ray showing right lung infiltrate, concerning for acute chest syndrome. Patient with recent admission and blood transfusion just 3 months ago. Patient is tachycardic, oxygen saturation 94% on room air. She is in no acute distress otherwise. Will start fluids, antibiotics for pneumonia. Will admit. Patient most likely will need blood transfusion.  Pt received vanc and zosyn for hcap. [facility] with [facility], will admit.   Filed Vitals:   10/27/15 0351 10/27/15 0416 10/27/15 0434 10/27/15 0548  BP: 116/58 125/65    Pulse: 110 115    Temp:  99.5 F (37.5 C)    TempSrc:  Oral    Resp: 23 16 19    Height:      Weight:      SpO2: 94% 90% 94% 87%      Jaynie Crumbleatyana Nasya Vincent, PA-C 10/27/15 0558  April Palumbo, MD 10/27/15 [phone]

## 2015-10-27 NOTE — ED Notes (Signed)
[place]. Finish antibiotics first, then run blood.

## 2015-10-27 NOTE — ED Notes (Addendum)
Critical HBG 6.3 Debra Williamson, in lab. EDP, Palumbo made aware of same.

## 2015-10-28 LAB — TROPONIN I: Troponin I: <0.03 ng/mL (ref <0.031)

## 2015-10-28 MED ORDER — DEXTROSE 5 % IV SOLN
1.0000 g | Freq: Three times a day (TID) | INTRAVENOUS | Status: DC
  Filled 2015-10-28: qty 1

## 2015-10-28 MED ORDER — VANCOMYCIN HCL IN DEXTROSE 750-5 MG/150ML-% IV SOLN
750.0000 mg | Freq: Three times a day (TID) | INTRAVENOUS | Status: DC
  Administered 2015-10-28: 750 mg via INTRAVENOUS
  Filled 2015-10-28: qty 150

## 2015-10-28 MED ORDER — OXYCODONE HCL ER 20 MG PO T12A
20.0000 mg | EXTENDED_RELEASE_TABLET | Freq: Two times a day (BID) | ORAL | Status: DC
  Administered 2015-10-28 – 2015-10-29 (×2): 20 mg via ORAL
  Filled 2015-10-28 (×2): qty 1

## 2015-10-28 MED ORDER — LEVOFLOXACIN 750 MG PO TABS
750.0000 mg | ORAL_TABLET | ORAL | Status: DC
Start: 2015-10-28 — End: 2015-10-29
  Administered 2015-10-28 – 2015-10-29 (×2): 750 mg via ORAL
  Filled 2015-10-28 (×2): qty 1

## 2015-10-28 NOTE — Progress Notes (Signed)
Subjective: A 35 yo female with history of sickle cell disease admitted with sickle cel painful crisis and acute chest syndrome. Patient has been on IV antibiotics and was transfused 1 unit of PRBC yesterday. Her problems are less that of pain. Has been doing better today with no complaints and no SOB. Has been on IV Dilaudid PRN only. Pain is at 5/10 involving her lower legs and back.  Objective: Vital signs in last 24 hours: Temp:  [98 F (36.7 C)-99.1 F (37.3 C)] 98 F (36.7 C) (01/07 1500) Pulse Rate:  [88-110] 88 (01/07 1500) Resp:  [16-23] 23 (01/07 1500) BP: (104-117)/(54-65) 107/54 mmHg (01/07 1500) SpO2:  [94 %-100 %] 100 % (01/07 1500) Weight change:  Last BM Date: 10/27/15 (per pt)  Intake/Output from previous day: 01/06 0701 - 01/07 0700 In: 783 [P.O.:120; Blood:663] Out: -  Intake/Output this shift:    General appearance: alert, cooperative and no distress Head: Normocephalic, without obvious abnormality, atraumatic Back: symmetric, no curvature. ROM normal. No CVA tenderness. Resp: clear to auscultation bilaterally Chest wall: no tenderness Cardio: regular rate and rhythm, S1, S2 normal, no murmur, click, rub or gallop GI: soft, non-tender; bowel sounds normal; no masses,  no organomegaly Extremities: extremities normal, atraumatic, no cyanosis or edema Skin: Skin color, texture, turgor normal. No rashes or lesions Neurologic: Grossly normal  Lab Results:  Recent Labs  10/27/15 0104 10/27/15 2120  WBC 18.7*  --   HGB 6.3* 8.1*  HCT 19.1* 24.1*  PLT 907*  --    BMET  Recent Labs  10/27/15 0104  NA 137  K 3.8  CL 102  CO2 28  GLUCOSE 101*  BUN 5*  CREATININE 0.53  CALCIUM 8.8*    Studies/Results: Dg Chest 2 View  10/26/2015  CLINICAL DATA:  Sickle cell patient with shortness of breath and fever. Right-sided pain. EXAM: CHEST  2 VIEW COMPARISON:  08/11/2015 FINDINGS: Tip of the right chest port in the mid SVC. Patchy right basilar opacity seen.  Linear opacities at the left lung base. The heart is normal in size. There is no pulmonary edema. Question small right pleural effusion. No pneumothorax. IMPRESSION: 1. Patchy right basilar opacity concerning for acute chest syndrome. Possible small right pleural effusion. 2. Streaky/ linear opacities at the left lung base may be atelectasis, scarring, or additional acute chest process. Electronically Signed   By: Rubye OaksMelanie  Ehinger M.D.   On: 10/26/2015 23:42    Medications: I have reviewed the patient's current medications.  Assessment/Plan: A 35 yo with symptomatic anemia as well as acute chest syndrome.  1. ACS: patient is already doing better. Will therefore continue supportive care on Telemetry and antibiotics until much better. 2. Symptomatic anemia; H/H much better after transfusion of 1 unit PRBC. Will continue to monitor. 3. Seizure Disorder: Patient on Keppra. We will continue. 4. Hx od CVA: continue home ASa.    LOS: 1 day   Aneya Daddona,LAWAL 10/28/2015, 4:20 PM

## 2015-10-29 LAB — CBC WITH DIFFERENTIAL/PLATELET
Basophils Absolute: 0 10*3/uL (ref 0.0–0.1)
Basophils Relative: 0 %
Eosinophils Absolute: 0.6 10*3/uL (ref 0.0–0.7)
Eosinophils Relative: 5 %
HCT: 22 % — ABNORMAL LOW (ref 36.0–46.0)
Hemoglobin: 7.3 g/dL — ABNORMAL LOW (ref 12.0–15.0)
Lymphocytes Relative: 17 %
Lymphs Abs: 2.1 10*3/uL (ref 0.7–4.0)
MCH: 30.8 pg (ref 26.0–34.0)
MCHC: 33.2 g/dL (ref 30.0–36.0)
MCV: 92.8 fL (ref 78.0–100.0)
Monocytes Absolute: 1.7 10*3/uL — ABNORMAL HIGH (ref 0.1–1.0)
Monocytes Relative: 14 %
Neutro Abs: 7.8 10*3/uL — ABNORMAL HIGH (ref 1.7–7.7)
Neutrophils Relative %: 64 %
Platelets: 725 10*3/uL — ABNORMAL HIGH (ref 150–400)
RBC: 2.37 MIL/uL — ABNORMAL LOW (ref 3.87–5.11)
RDW: 19.7 % — ABNORMAL HIGH (ref 11.5–15.5)
WBC: 12.1 10*3/uL — ABNORMAL HIGH (ref 4.0–10.5)

## 2015-10-29 LAB — COMPREHENSIVE METABOLIC PANEL
ALT: 33 U/L (ref 14–54)
AST: 46 U/L — ABNORMAL HIGH (ref 15–41)
Albumin: 3.2 g/dL — ABNORMAL LOW (ref 3.5–5.0)
Alkaline Phosphatase: 139 U/L — ABNORMAL HIGH (ref 38–126)
Anion gap: 9 (ref 5–15)
BUN: 6 mg/dL (ref 6–20)
CO2: 28 mmol/L (ref 22–32)
Calcium: 8.8 mg/dL — ABNORMAL LOW (ref 8.9–10.3)
Chloride: 102 mmol/L (ref 101–111)
Creatinine, Ser: 0.43 mg/dL — ABNORMAL LOW (ref 0.44–1.00)
GFR calc Af Amer: >60 mL/min (ref >60)
GFR calc non Af Amer: >60 mL/min (ref >60)
Glucose, Bld: 93 mg/dL (ref 65–99)
Potassium: 4.2 mmol/L (ref 3.5–5.1)
Sodium: 139 mmol/L (ref 135–145)
Total Bilirubin: 1.5 mg/dL — ABNORMAL HIGH (ref 0.3–1.2)
Total Protein: 7.1 g/dL (ref 6.5–8.1)

## 2015-10-29 LAB — VANCOMYCIN, TROUGH: Vancomycin Tr: 4 ug/mL — ABNORMAL LOW (ref 10.0–20.0)

## 2015-10-29 MED ORDER — LEVOFLOXACIN 750 MG PO TABS: 750.0000 mg | ORAL_TABLET | ORAL | Status: DC

## 2015-10-29 MED ORDER — HEPARIN SOD (PORK) LOCK FLUSH 100 UNIT/ML IV SOLN
500.0000 [IU] | INTRAVENOUS | Status: AC | PRN
  Administered 2015-10-29: 500 [IU]

## 2015-10-29 NOTE — Discharge Summary (Signed)
Physician Discharge Summary  Patient ID: Debra Williamson MRN: [id number] DOB/AGE: 06/22/1981 35 y.o.  Admit date: 10/26/2015 Discharge date: 10/29/2015  Admission Diagnoses:  Discharge Diagnoses:  Principal Problem:   Hb-SS disease with acute chest syndrome [facility](HCC) Active Problems:   Symptomatic anemia   H/O: stroke   Discharged Condition: good  Hospital Course:   Patient admitted with acute sickle cell crisis with some mild sickle cell  Acute chest syndrome. Also symptomatic anemia. Her hemoglobin was 6.3 g and per patient, she always gets in trouble when  It drops below 7g.  She was transfused 1 unit of packed red blood cells and her hemoglobin has risen to 8.1g. Has been doing well and was discharged home on her home regimen.  Consults: None  Significant Diagnostic Studies: labs: CBCs and CMPs checked  Treatments: IV hydration, analgesia: Dilaudid and Blood transfusion.  Discharge Exam: Blood pressure 94/63, pulse 87, temperature 98.3 F (36.8 C), temperature source Oral, resp. rate 14, height 5\' 4"  (1.626 m), weight 73.029 kg (161 lb), last menstrual period 10/24/2015, SpO2 93 %. General appearance: alert, cooperative and no distress Head: Normocephalic, without obvious abnormality, atraumatic Neck: no adenopathy, no carotid bruit, no JVD, supple, symmetrical, trachea midline and thyroid not enlarged, symmetric, no tenderness/mass/nodules Back: symmetric, no curvature. ROM normal. No CVA tenderness. Resp: clear to auscultation bilaterally Chest wall: no tenderness Cardio: regular rate and rhythm, S1, S2 normal, no murmur, click, rub or gallop GI: soft, non-tender; bowel sounds normal; no masses,  no organomegaly Extremities: extremities normal, atraumatic, no cyanosis or edema Pulses: 2+ and symmetric Skin: Skin color, texture, turgor normal. No rashes or lesions Neurologic: Grossly normal  Disposition: 01-Home or Self Care     Medication List    STOP taking these  medications        DOLOPHINE 5 MG tablet  Generic drug:  methadone      TAKE these medications        aspirin EC 81 MG tablet  Take 1 tablet by mouth daily.     folic acid 1 MG tablet  Commonly known as:  FOLVITE  Take 1 mg by mouth daily.     lamoTRIgine 200 MG tablet  Commonly known as:  LAMICTAL  Take 1 tablet by mouth 2 (two) times daily.     levETIRAcetam 1000 MG tablet  Commonly known as:  KEPPRA  Take 1 tablet by mouth at bedtime.     levofloxacin 750 MG tablet  Commonly known as:  LEVAQUIN  Take 1 tablet (750 mg total) by mouth daily.     Oxycodone HCl 10 MG Tabs  Take 1 tablet (10 mg total) by mouth every 4 (four) hours as needed (pain).     oxyCODONE 30 MG 12 hr tablet  Take 1 tablet by mouth every 12 (twelve) hours.     promethazine 25 MG tablet  Commonly known as:  PHENERGAN  Take 1 tablet by mouth every 8 (eight) hours as needed. n/v     zolpidem 5 MG tablet  Commonly known as:  AMBIEN  Take 1 tablet (5 mg total) by mouth at bedtime as needed. sleep         Signed: GARBA,LAWAL 10/29/2015, 3:15 PM  Time spent 22 minutes

## 2015-10-29 NOTE — Progress Notes (Signed)
Pt left at this time with her spouse. Alert, oriented, and without c/o. Discharge instructions/prescription given/explained with pt verbalizing understanding. IV team has clamped PAC.

## 2015-10-30 LAB — TYPE AND SCREEN
ABO/RH(D): A POS
Antibody Screen: NEGATIVE
Unit division: 0
Unit division: 0

## 2015-12-04 ENCOUNTER — Ambulatory Visit: Payer: Medicaid Other | Admitting: Neurology

## 2016-01-02 ENCOUNTER — Other Ambulatory Visit: Payer: Medicaid Other

## 2016-01-02 ENCOUNTER — Ambulatory Visit (INDEPENDENT_AMBULATORY_CARE_PROVIDER_SITE_OTHER): Payer: Medicaid Other | Admitting: Neurology

## 2016-01-02 ENCOUNTER — Encounter: Payer: Self-pay | Admitting: Neurology

## 2016-01-02 VITALS — BP 118/70 | HR 88 | Ht 64.0 in | Wt 154.0 lb

## 2016-01-02 DIAGNOSIS — G40109 Localization-related (focal) (partial) symptomatic epilepsy and epileptic syndromes with simple partial seizures, not intractable, without status epilepticus: Secondary | ICD-10-CM

## 2016-01-02 DIAGNOSIS — Z8673 Personal history of transient ischemic attack (TIA), and cerebral infarction without residual deficits: Secondary | ICD-10-CM

## 2016-01-02 MED ORDER — LAMOTRIGINE 200 MG PO TABS: 200.0000 mg | ORAL_TABLET | Freq: Two times a day (BID) | ORAL | Status: DC

## 2016-01-02 MED ORDER — LEVETIRACETAM 1000 MG PO TABS: 1000.0000 mg | ORAL_TABLET | Freq: Two times a day (BID) | ORAL | Status: DC

## 2016-01-02 NOTE — Progress Notes (Signed)
NEUROLOGY CONSULTATION NOTE  Debra Williamson Claire MRN: [id number] DOB: [birth year]  Referring provider: Clint Lippsaphnee Pierre-Louis, FNP-C Primary care provider: Dr. Willey BladeEric Dean  Reason for consult:  Establish care for seizures  Thank you for your kind referral of Debra Williamson Smelcer for consultation of the above symptoms. Although her history is well known to you, please allow me to reiterate it for the purpose of our medical record. Records and images were personally reviewed where available.  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 The first seizure occurred 10 years ago after she received an unrecalled pain medication. She reports being started on seizure medication at that time. The second seizure occurred when her now 37 year old son was a week old, she was holding him then started having twitching on her left side which progressed to a convulsion. She recalls being aware of the twitching, no speech difficulties or confusion. Lamotrigine and Levetiracetam doses were increased to Lamotrigine 200mg  BID and Levetiracetam 1000mg  BID, she was seizure-free for 10 years until last Sept/October 2016 when she missed a day of her medications and had a breakthrough seizure again starting with left-sided twitching then convulsion. She would fall to the ground, no injuries, no tongue bite/urinary incontinence. She denies any isolated episodes of left-sided twitching, but does have occasional clonus/muscle spasms on the left arm different from the twitching. She denies any olfactory/gustatory hallucinations, deja vu, rising epigastric sensation, focal numbness/tingling. She denies any headaches, dizziness, diplopia, dysarthria, dysphagia, neck/back pain, bowel/bladder dysfunction. She denies any side effects on current AEDs. She reports sleep difficulties  but her mother does not want her to take Ambien. She rarely drinks alcohol.  Epilepsy Risk Factors:  Right MCA stroke with residual left hemiparesis. Otherwise she had a normal birth and early development.  There is no history of febrile convulsions, CNS infections such as meningitis/encephalitis, significant traumatic brain injury, neurosurgical procedures, or family history of seizures.  Laboratory Data 09/18/15 from PCP: CBC showed a WBC of 14.3, Hct 21.9, Hgb 7.3, Plt 612; CMP showed Na 138, creatinine 0.52, AST 49, ALT 42.  No prior EEGs or MRI available from [place] (patient moved to [place] a year ago)  PAST MEDICAL HISTORY: Past Medical History  Diagnosis Date  . Sickle cell disease (HCC)   . Seizures (HCC)     one time incident, may have been r/t to a pain medication she was prescribed  . Anemia   . Blood transfusion without reported diagnosis   . Stroke [facility](HCC)     PAST SURGICAL HISTORY: Past Surgical History  Procedure Laterality Date  . Cholecystectomy    . Cesarean section      x4  . Reverse tubal ligation    . Portacath placement Right w-3    MEDICATIONS: Current Outpatient Prescriptions on File Prior to Visit  Medication Sig Dispense Refill  . aspirin EC 81 MG tablet Take 1 tablet by mouth daily.    . folic acid (FOLVITE) 1 MG tablet Take 1 mg by mouth daily.     Marland Kitchen. lamoTRIgine (LAMICTAL) 200 MG tablet Take 1 tablet by mouth 2 (two) times daily.    Marland Kitchen. levETIRAcetam (KEPPRA) 1000 MG tablet Take 1 tablet by mouth 2 (two) times daily.     . Oxycodone HCl 10 MG TABS Take 1 tablet (10 mg total) by mouth every 4 (four) hours  as needed (pain). 60 tablet 0  . OxyCODONE HCl ER 30 MG T12A Take 1 tablet by mouth every 12 (twelve) hours. 60 each 0  . promethazine (PHENERGAN) 25 MG tablet Take 1 tablet by mouth every 8 (eight) hours as needed. n/v    . zolpidem (AMBIEN) 5 MG tablet Take 1 tablet (5 mg total) by mouth at bedtime as needed. sleep 30 tablet 0   No current  facility-administered medications on file prior to visit.    ALLERGIES: Allergies  Allergen Reactions  . Ibuprofen Hives  . Zofran [Ondansetron Hcl] Hives and Itching  . Demerol [Meperidine] Other (See Comments)    Pt has seizures  . Fentanyl And Related Nausea And Vomiting and Other (See Comments)    Very sedated  **Not allergic to the injection** JUST ALLERGIC TO PATCH  . Other Itching    Greek Yogurt  . Codeine Hives and Itching  . Hydrocodone Hives and Itching    FAMILY HISTORY: Family History  Problem Relation Age of Onset  . HIV/AIDS Father     SOCIAL HISTORY: Social History   Social History  . Marital Status: Married    Spouse Name: N/A  . Number of Children: 4  . Years of Education: N/A   Occupational History  . Doesn't work    Social History Main Topics  . Smoking status: Never Smoker   . Smokeless tobacco: Never Used  . Alcohol Use: 0.0 oz/week    0 Standard drinks or equivalent per week     Comment: occasionally  . Drug Use: No  . Sexual Activity: Yes   Other Topics Concern  . Not on file   Social History Narrative    REVIEW OF SYSTEMS: Constitutional: No fevers, chills, or sweats, no generalized fatigue, change in appetite Eyes: No visual changes, double vision, eye pain Ear, nose and throat: No hearing loss, ear pain, nasal congestion, sore throat Cardiovascular: No chest pain, palpitations Respiratory:  No shortness of breath at rest or with exertion, wheezes GastrointestinaI: No nausea, vomiting, diarrhea, abdominal pain, fecal incontinence Genitourinary:  No dysuria, urinary retention or frequency Musculoskeletal:  No neck pain, back pain Integumentary: No rash, pruritus, skin lesions Neurological: as above Psychiatric: No depression, insomnia, anxiety Endocrine: No palpitations, fatigue, diaphoresis, mood swings, change in appetite, change in weight, increased thirst Hematologic/Lymphatic:  No anemia, purpura,  petechiae. Allergic/Immunologic: no itchy/runny eyes, nasal congestion, recent allergic reactions, rashes  PHYSICAL EXAM: Filed Vitals:   01/02/16 0854  BP: 118/70  Pulse: 88   General: No acute distress Head:  Normocephalic/atraumatic Eyes: Fundoscopic exam shows bilateral sharp discs, no vessel changes, exudates, or hemorrhages Neck: supple, no paraspinal tenderness, full range of motion Back: No paraspinal tenderness Heart: regular rate and rhythm Lungs: Clear to auscultation bilaterally. Vascular: No carotid bruits. Skin/Extremities: No rash, no edema. Atrophy on left UE, wearing ankle brace on left  Neurological Exam: Mental status: alert and oriented to person, place, and time, no dysarthria or aphasia, Fund of knowledge is appropriate.  Remote memory intact. 1/3 delayed recall.  Attention and concentration are normal. Difficulty spelling WORLD backwards.  Able to name objects and repeat phrases. Cranial nerves: CN I: not tested CN II: pupils equal, round and reactive to light, visual fields intact, fundi unremarkable. CN III, IV, VI:  full range of motion, no nystagmus, no ptosis CN V: facial sensation intact CN VII: upper and lower face symmetric CN VIII: hearing intact to finger rub CN IX, X: gag intact, uvula midline  CN XI: sternocleidomastoid and trapezius muscles intact CN XII: tongue midline Bulk & Tone: increased on left UE and LE with spastic contracture at left elbow and fingers Motor: 5/5 on right UE and LE, 5/5 on proximal left UE, fingers flexed on left hand with 1/5 finger extension and 3/5 wrist extension; 5/5 proximal left LE, 0/5 left foot dorsi and plantar flexion wearing ankle brace. Sensation: intact to light touch, cold, pin, vibration and joint position sense.  No extinction to double simultaneous stimulation.  Romberg test negative Deep Tendon Reflexes: +2 on right UE and LE, brisk +4 left UE and LE with clonus on UE.  Plantar responses: downgoing on  right, upgoing on left Cerebellar: no incoordination on finger to nose on right, difficulty with left UE due to weakness Gait: spastic hemiparetic gait with left leg circumduction Tremor: none  IMPRESSION: This is a pleasant 35 year old right-handed woman with a history of sickle cell anemia, stroke at age 58 with residual left hemiparesis, and seizures suggestive of focal to bilateral tonic-clonic seizures likely arising from old stroke with symptoms of left-sided twitching preceding convulsions. She has had 3 seizures in her lifetime, last seizure was in Sept/Oct 2016 in the setting of missed medication, otherwise she does well when taking medications regularly. Continue Lamotrigine  BID and Levetiracetam  BID. She and her husband are interested in pregnancy, we discussed issues in women with epilepsy, including fetal malformations and overall safety of her current medications. She will need to have blood level monitoring monthly once she is pregnant. Continue daily prenatal vitamins and folic acid . Continue aspirin for secondary stroke prevention. We discussed avoidance of seizure triggers, including missing medications, sleep deprivation, and alcohol. She will try melatonin to help with sleep. [place] driving laws were discussed with the patient, and she knows to stop driving after a seizure, until 6 months seizure-free. She will follow-up in 6 months and knows to call for any changes.   Thank you for allowing me to participate in the care of this patient. Please do not hesitate to call for any questions or concerns.   Patrcia Dolly, M.D.

## 2016-01-02 NOTE — Patient Instructions (Signed)
1. Continue Lamictal 200mg  twice a day and Keppra 1000mg  twice a day 2. Bloodwork for Lamictal and Keppra levels 3. Once you are pregnant, please call our office, we will need to do blood levels every month 4. As per [place] driving laws, no driving until 6 months seizure-free 5. Follow-up in 6 months, call for any changes  Seizure Precautions: 1. If medication has been prescribed for you to prevent seizures, take it exactly as directed.  Do not stop taking the medicine without talking to your doctor first, even if you have not had a seizure in a long time.   2. Avoid activities in which a seizure would cause danger to yourself or to others.  Don't operate dangerous machinery, swim alone, or climb in high or dangerous places, such as on ladders, roofs, or girders.  Do not drive unless your doctor says you may.  3. If you have any warning that you may have a seizure, lay down in a safe place where you can't hurt yourself.    4.  No driving for 6 months from last seizure, as per [place]  [place] law.   Please refer to the following link on the Epilepsy Foundation of America's website for more information: http://www.epilepsyfoundation.org/answerplace/Social/driving/drivingu.cfm   5.  Maintain good sleep hygiene. Avoid alcohol.  6.  Notify your neurology if you are planning pregnancy or if you become pregnant.  7.  Contact your doctor if you have any problems that may be related to the medicine you are taking.  8.  Call 911 and bring the patient back to the ED if:        A.  The seizure lasts longer than 5 minutes.       B.  The patient doesn't awaken shortly after the seizure  C.  The patient has new problems such as difficulty seeing, speaking or moving  D.  The patient was injured during the seizure  E.  The patient has a temperature over 102 F (39C)  F.  The patient vomited and now is having trouble breathing

## 2016-01-04 LAB — LEVETIRACETAM LEVEL: Keppra (Levetiracetam): 14.1 ug/mL

## 2016-01-04 LAB — LAMOTRIGINE LEVEL: Lamotrigine Lvl: 4.5 ug/mL (ref 4.0–18.0)

## 2016-01-05 ENCOUNTER — Telehealth: Payer: Self-pay | Admitting: Family Medicine

## 2016-01-05 NOTE — Telephone Encounter (Signed)
-----   Message from Van ClinesKaren M Aquino, MD sent at 01/05/2016  8:53 AM EDT ----- Pls let her know blood levels look good. Let us know when pregnant and we will need to check monthly.

## 2016-01-05 NOTE — Telephone Encounter (Signed)
Patient was notified of results & advisement. 

## 2016-06-17 ENCOUNTER — Ambulatory Visit (INDEPENDENT_AMBULATORY_CARE_PROVIDER_SITE_OTHER): Payer: Medicaid Other | Admitting: Family Medicine

## 2016-06-17 ENCOUNTER — Encounter: Payer: Self-pay | Admitting: Family Medicine

## 2016-06-17 VITALS — BP 102/62 | HR 80 | Temp 98.2°F | Ht 64.5 in | Wt 150.0 lb

## 2016-06-17 DIAGNOSIS — D57419 Sickle-cell thalassemia with crisis, unspecified: Secondary | ICD-10-CM

## 2016-06-17 DIAGNOSIS — N912 Amenorrhea, unspecified: Secondary | ICD-10-CM

## 2016-06-17 LAB — COMPLETE METABOLIC PANEL WITH GFR
ALT: 34 U/L — ABNORMAL HIGH (ref 6–29)
AST: 45 U/L — ABNORMAL HIGH (ref 10–30)
Albumin: 4.4 g/dL (ref 3.6–5.1)
Alkaline Phosphatase: 75 U/L (ref 33–115)
BUN: 7 mg/dL (ref 7–25)
CO2: 24 mmol/L (ref 20–31)
Calcium: 9.3 mg/dL (ref 8.6–10.2)
Chloride: 103 mmol/L (ref 98–110)
Creat: 0.58 mg/dL (ref 0.50–1.10)
GFR, Est African American: >89 mL/min (ref >=60)
GFR, Est Non African American: >89 mL/min (ref >=60)
Glucose, Bld: 83 mg/dL (ref 65–99)
Potassium: 4.7 mmol/L (ref 3.5–5.3)
Sodium: 138 mmol/L (ref 135–146)
Total Bilirubin: 2.6 mg/dL — ABNORMAL HIGH (ref 0.2–1.2)
Total Protein: 7.6 g/dL (ref 6.1–8.1)

## 2016-06-17 LAB — LIPID PANEL
Cholesterol: 134 mg/dL (ref 125–200)
HDL: 34 mg/dL — ABNORMAL LOW (ref >=46)
LDL Cholesterol: 74 mg/dL (ref <130)
Total CHOL/HDL Ratio: 3.9 Ratio (ref <=5.0)
Triglycerides: 129 mg/dL (ref <150)
VLDL: 26 mg/dL (ref <30)

## 2016-06-17 LAB — POCT URINE PREGNANCY: Preg Test, Ur: NEGATIVE

## 2016-06-17 NOTE — Progress Notes (Signed)
Debra Williamson, is a 35 y.o. female  ZOX:[id number]  [id number]  DOB - [birth year]  CC:  Chief Complaint  Patient presents with  . New Patient (Initial Visit)    has SSD needs new provider, had been going to [facility], doing well       HPI: Debra Williamson is a 35 y.o. female here to establish care. She is here for primary care and management of HB-SS  She reports having rare crisis. She reports not having pain medication for 2 weeks. She is rather vague about where she has been getting her narcotics. According to [place] Controlled Substance website show she had both her long-acting and short-acting narcotics from Dr. Willey Blade on May 06, 2016. She is very vague about her previous treatment.  She has a history of an admission for Acute chest syndrome in January 2017; She had a CVA yyears ago and has residual left sided weakness. She reports her usual Noank pain is in her knees, elbows, shoulders. She also has a seizure disorder and is on Lamictal and Keppra per her neurologist. She report taking her folic acid, phenergan and Ambien as prescribed. She has been prescribed oxycodone 10 q 4 hours and Oxy HCLER 30 mg. She does not take these regularly.   Health Maintenance: She is in need of a dilated eye exam, urine micral. She reports having a PAP 3 years ago. She needs a Tdap, influenza and Prevar. We are unable to give today due to a problem with the refrigerator.   Allergies  Allergen Reactions  . Ibuprofen Hives  . Zofran [Ondansetron Hcl] Hives and Itching  . Demerol [Meperidine] Other (See Comments)    Pt has seizures  . Fentanyl And Related Nausea And Vomiting and Other (See Comments)    Very sedated  **Not allergic to the injection** JUST ALLERGIC TO PATCH  . Other Itching    Greek Yogurt  . Codeine Hives and Itching  . Hydrocodone Hives and Itching   Past Medical History:  Diagnosis Date  . Anemia   . Blood transfusion without reported diagnosis   . Seizures (HCC)    one time incident, may have been r/t to a pain medication she was prescribed  . Sickle cell disease (HCC)   . Stroke [facility])    Current Outpatient Prescriptions on File Prior to Visit  Medication Sig Dispense Refill  . aspirin EC 81 MG tablet Take 1 tablet by mouth daily.    . folic acid (FOLVITE) 1 MG tablet Take 1 mg by mouth daily.     Marland Kitchen lamoTRIgine (LAMICTAL) 200 MG tablet Take 1 tablet (200 mg total) by mouth 2 (two) times daily. 180 tablet 3  . levETIRAcetam (KEPPRA) 1000 MG tablet Take 1 tablet (1,000 mg total) by mouth 2 (two) times daily. 180 tablet 3  . Oxycodone HCl 10 MG TABS Take 1 tablet (10 mg total) by mouth every 4 (four) hours as needed (pain). 60 tablet 0  . OxyCODONE HCl ER 30 MG T12A Take 1 tablet by mouth every 12 (twelve) hours. 60 each 0  . promethazine (PHENERGAN) 25 MG tablet Take 1 tablet by mouth every 8 (eight) hours as needed. n/v    . zolpidem (AMBIEN) 5 MG tablet Take 1 tablet (5 mg total) by mouth at bedtime as needed. sleep 30 tablet 0   No current facility-administered medications on file prior to visit.    Family History  Problem Relation Age of Onset  . HIV/AIDS Father  Social History   Social History  . Marital status: Married    Spouse name: N/A  . Number of children: 4  . Years of education: N/A   Occupational History  . Doesn't work    Social History Main Topics  . Smoking status: Never Smoker  . Smokeless tobacco: Never Used  . Alcohol use No     Comment: occasionally  . Drug use: No  . Sexual activity: Yes   Other Topics Concern  . Not on file   Social History Narrative  . No narrative on file    Review of Systems: Constitutional: Negative for fever, chills, appetite change, weight loss,  Fatigue. Skin: Negative for rashes or lesions of concern. HENT: Negative for ear pain, ear discharge.nose bleeds Eyes: Negative for pain, discharge, redness, itching and visual disturbance. Neck: Negative for pain, stiffness Respiratory:  Negative for cough. Positive for occasional shortness of breath   Cardiovascular: Negative for chest pain, and leg swelling.Positive for palpitations Gastrointestinal: Negative for abdominal pain, nausea, vomiting, diarrhea, constipations Genitourinary: Negative for dysuria, urgency, frequency, hematuria,  Musculoskeletal: Positive for left sided weakness, pain in knees, elbows and shoulders. Neurological: Negative for dizziness, tremors,  syncope,   light-headedness, numbness and headaches. No seizures in 10 years Hematological: Negative for easy bruising or bleeding Psychiatric/Behavioral: Negative for depression, anxiety, decreased concentration, confusion   Objective:   Vitals:   06/17/16 0853  BP: 102/62  Pulse: 80  Temp: 98.2 F (36.8 C)    Physical Exam: Constitutional: Patient appears well-developed and well-nourished. No distress. HENT: Normocephalic, atraumatic, External right and left ear normal. Oropharynx is clear and moist.  Eyes: Conjunctivae and EOM are normal. PERRLA, no scleral icterus. Neck: Normal ROM. Neck supple. No lymphadenopathy, No thyromegaly. CVS: RRR, S1/S2 +, no murmurs, no gallops, no rubs Pulmonary: Effort and breath sounds normal, no stridor, rhonchi, wheezes, rales.  Abdominal: Soft. Normoactive BS,, no distension, tenderness, rebound or guarding.  Musculoskeletal:  No edema and no tenderness. Decreased ROM with brace on left lower leg Neuro: Alert.Normal muscle tone coordination. Non-focal Skin: Skin is warm and dry. No rash noted. Not diaphoretic. No erythema. No pallor. Psychiatric: Normal mood and affect. Behavior, judgment, thought content normal.  Lab Results  Component Value Date   WBC 12.1 (H) 10/29/2015   HGB 7.3 (L) 10/29/2015   HCT 22.0 (L) 10/29/2015   MCV 92.8 10/29/2015   PLT 725 (H) 10/29/2015   Lab Results  Component Value Date   CREATININE 0.43 (L) 10/29/2015   BUN 6 10/29/2015   NA 139 10/29/2015   K 4.2 10/29/2015    CL 102 10/29/2015   CO2 28 10/29/2015    No results found for: HGBA1C Lipid Panel  No results found for: CHOL, TRIG, HDL, CHOLHDL, VLDL, LDLCALC     Assessment and plan:   1. Sickle-cell thalassemia with crisis (HCC)  - COMPLETE METABOLIC PANEL WITH GFR - CBC with Differential - Reticulocytes - Lipid panel - Microalbumin, urine - Ambulatory referral to Ophthalmology  2. Amenorrhea  - POCT urine pregnancy   Return in about 1 month (around 07/18/2016).  The patient was given clear instructions to go to ER or return to medical center if symptoms don't improve, worsen or new problems develop. The patient verbalized understanding.    Henrietta HooverLinda C Camellia Popescu FNP  06/17/2016, 12:08 PM

## 2016-06-18 LAB — CBC WITH DIFFERENTIAL/PLATELET
Basophils Absolute: 0 cells/uL (ref 0–200)
Basophils Relative: 0 %
Eosinophils Absolute: 121 cells/uL (ref 15–500)
Eosinophils Relative: 1 %
HCT: 20.5 % — ABNORMAL LOW (ref 35.0–45.0)
Hemoglobin: 7.2 g/dL — ABNORMAL LOW (ref 11.7–15.5)
Lymphocytes Relative: 28 %
Lymphs Abs: 3388 cells/uL (ref 850–3900)
MCH: 38.7 pg — ABNORMAL HIGH (ref 27.0–33.0)
MCHC: 33.2 g/dL (ref 32.0–36.0)
MCV: 110.2 fL — ABNORMAL HIGH (ref 80.0–100.0)
MPV: 9.6 fL (ref 7.5–12.5)
Monocytes Absolute: 1210 cells/uL — ABNORMAL HIGH (ref 200–950)
Monocytes Relative: 10 %
Neutro Abs: 7381 cells/uL (ref 1500–7800)
Neutrophils Relative %: 61 %
Platelets: 620 10*3/uL — ABNORMAL HIGH (ref 140–400)
RBC: 1.86 MIL/uL — ABNORMAL LOW (ref 3.80–5.10)
RDW: 19.4 % — ABNORMAL HIGH (ref 11.0–15.0)
WBC: 12.1 10*3/uL — ABNORMAL HIGH (ref 3.8–10.8)

## 2016-06-18 LAB — RETICULOCYTES
RBC.: 1.86 MIL/uL — ABNORMAL LOW (ref 3.80–5.10)
Retic Ct Pct: >23 %

## 2016-06-18 LAB — MICROALBUMIN, URINE: Microalb, Ur: 2.3 mg/dL

## 2016-06-21 ENCOUNTER — Encounter (HOSPITAL_COMMUNITY): Payer: Self-pay | Admitting: *Deleted

## 2016-06-21 ENCOUNTER — Emergency Department (HOSPITAL_COMMUNITY): Payer: Medicaid Other

## 2016-06-21 ENCOUNTER — Emergency Department (HOSPITAL_COMMUNITY)
Admission: EM | Admit: 2016-06-21 | Discharge: 2016-06-21 | Disposition: A | Discharge status: Home / Self Care | Payer: Medicaid Other | Attending: Emergency Medicine | Admitting: Emergency Medicine

## 2016-06-21 DIAGNOSIS — Z79899 Other long term (current) drug therapy: Secondary | ICD-10-CM | POA: Insufficient documentation

## 2016-06-21 DIAGNOSIS — Z7982 Long term (current) use of aspirin: Secondary | ICD-10-CM | POA: Diagnosis not present

## 2016-06-21 DIAGNOSIS — R221 Localized swelling, mass and lump, neck: Secondary | ICD-10-CM | POA: Diagnosis not present

## 2016-06-21 DIAGNOSIS — H9203 Otalgia, bilateral: Secondary | ICD-10-CM | POA: Diagnosis present

## 2016-06-21 LAB — TSH: TSH: 0.668 u[IU]/mL (ref 0.350–4.500)

## 2016-06-21 LAB — POC URINE PREG, ED: Preg Test, Ur: NEGATIVE

## 2016-06-21 NOTE — ED Notes (Signed)
Patient returned from xray.

## 2016-06-21 NOTE — ED Triage Notes (Addendum)
Pt reports bila otalgia and lump in front of her throat.  Denies pain in her throat and reports pain.  Has hx of sickle cell, however reports this is not where she normally has pain when she has crisis.

## 2016-06-21 NOTE — ED Notes (Signed)
MD at bedside. 

## 2016-06-21 NOTE — ED Provider Notes (Signed)
[facility]-EMERGENCY DEPT Provider Note   CSN: [id number] Arrival date & time: 06/21/16  4098     History   Chief Complaint Chief Complaint  Patient presents with  . Otalgia    HPI Debra Williamson is a 35 y.o. female.  The history is provided by the patient. No language interpreter was used.  Otalgia    Debra Williamson is a 35 y.o. female who presents to the Emergency Department complaining of ear pain.  She reports 1 day of bilateral ear pain with a feeling of a lump in her anterior throat. Pain is mild in nature. No fevers, cough, shortness of breath, chest pain. No sore throats. She has mild nasal congestion. She has a history of sickle cell disease and was just checked a few days ago with stable labs. She states that her oxygen sats are low sometimes but she denies any symptoms. Her LMP was in July and she normally does not miss cycles.  Past Medical History:  Diagnosis Date  . Anemia   . Blood transfusion without reported diagnosis   . Seizures (HCC)    one time incident, may have been r/t to a pain medication she was prescribed  . Sickle cell disease (HCC)   . Stroke [facility])     Patient Active Problem List   Diagnosis Date Noted  . 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 History of stroke associated with blood clotting tendency 01/02/2016  . 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 Hb-SS disease with acute chest syndrome (HCC) 08/14/2015  . Acute chest syndrome (HCC)   . Acute respiratory failure with hypoxemia (HCC)   . Community acquired pneumonia 08/08/2015  . Hemiparesis following cerebrovascular accident (CVA) (HCC) 05/30/2015  . Ankle gives out 05/30/2015  . Seizure disorder (HCC) 05/30/2015  . 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 H/O: stroke 03/12/2015  . Seizures (HCC) 03/12/2015  . Hemoglobin S-S disease  (HCC) 03/11/2015  . Sickle cell crisis (HCC) 03/11/2015  . Symptomatic anemia 03/11/2015    Past Surgical History:  Procedure Laterality Date  . CESAREAN SECTION     x4  . CHOLECYSTECTOMY    . PORTACATH PLACEMENT Right w-3  . reverse tubal ligation      OB History    No data available       Home Medications    Prior to Admission medications   Medication Sig Start Date End Date Taking? Authorizing Provider  aspirin EC 81 MG tablet Take 1 tablet by mouth daily. 11/03/13  Yes Historical Provider, MD  folic acid (FOLVITE) 1 MG tablet Take 1 mg by mouth daily.  02/13/15  Yes Historical Provider, MD  lamoTRIgine (LAMICTAL) 200 MG tablet Take 1 tablet (200 mg total) by mouth 2 (two) times daily. 01/02/16  Yes Van Clines, MD  levETIRAcetam (KEPPRA) 1000 MG tablet Take 1 tablet (1,000 mg total) by mouth 2 (two) times daily. 01/02/16  Yes Van Clines, MD  Oxycodone HCl 10 MG TABS Take 1 tablet (10 mg total) by mouth every 4 (four) hours as needed (pain). 08/14/15  Yes Altha Harm, MD  OxyCODONE HCl ER 30 MG T12A Take 1 tablet by mouth every 12 (twelve) hours. 08/14/15  Yes Altha Harm, MD  promethazine (PHENERGAN) 25 MG tablet Take 1 tablet by mouth every 8 (eight) hours as needed. n/v 02/13/15  Yes Historical Provider, MD  zolpidem Remus Loffler)  5 MG tablet Take 1 tablet (5 mg total) by mouth at bedtime as needed. sleep Patient not taking: Reported on 06/21/2016 03/13/15   Altha HarmMichelle A Matthews, MD    Family History Family History  Problem Relation Age of Onset  . HIV/AIDS Father     Social History Social History  Substance Use Topics  . Smoking status: Never Smoker  . Smokeless tobacco: Never Used  . Alcohol use No     Comment: occasionally     Allergies   Ibuprofen; Zofran [ondansetron hcl]; Demerol [meperidine]; Fentanyl and related; Other; Codeine; and Hydrocodone   Review of Systems Review of Systems  HENT: Positive for ear pain.   All other systems  reviewed and are negative.    Physical Exam Updated Vital Signs BP 111/61 (BP Location: Left Arm)   Pulse 81   Temp 99.2 F (37.3 C) (Oral)   Resp 20   Ht 5\' 4"  (1.626 m)   Wt 150 lb (68 kg)   LMP 05/03/2016 (Approximate)   SpO2 90%   BMI 25.75 kg/m   Physical Exam  Constitutional: She is oriented to person, place, and time. She appears well-developed and well-nourished.  HENT:  Head: Normocephalic and atraumatic.  Nose: Nose normal.  Mouth/Throat: Oropharynx is clear and moist. No oropharyngeal exudate.  TMs clear bilaterally  Neck: Neck supple. No thyromegaly present.  Tiny nonmobile nodular region and anterior upper neck that is less than half a centimeter in diameter.  Cardiovascular: Normal rate and regular rhythm.   Systolic ejection murmur  Pulmonary/Chest: Effort normal and breath sounds normal. No respiratory distress.  Abdominal: Soft. There is no tenderness. There is no rebound and no guarding.  Musculoskeletal: She exhibits no edema or tenderness.  Neurological: She is alert and oriented to person, place, and time.  Skin: Skin is warm and dry.  Psychiatric: She has a normal mood and affect. Her behavior is normal.  Nursing note and vitals reviewed.    ED Treatments / Results  Labs (all labs ordered are listed, but only abnormal results are displayed) Labs Reviewed  TSH  POC URINE PREG, ED    EKG  EKG Interpretation None       Radiology Dg Chest 2 View  Result Date: 06/21/2016 CLINICAL DATA:  35 year old with bilateral ear pain, sore throat and congestion for 2 days. EXAM: CHEST  2 VIEW COMPARISON:  10/26/2015 and 08/11/2015 radiographs. FINDINGS: Mild patient rotation to the right on the frontal examination. The right IJ Port-A-Cath appears unchanged. The heart size and mediastinal contours are stable. Patchy bibasilar opacities noted on the prior examination have resolved. The lungs are now clear. There is no pleural effusion or pneumothorax. The  bones appear unchanged. IMPRESSION: Interval resolution of bibasilar airspace opacities. No acute cardiopulmonary process. Electronically Signed   By: Carey BullocksWilliam  Veazey M.D.   On: 06/21/2016 10:10    Procedures Procedures (including critical care time)  Medications Ordered in ED Medications - No data to display   Initial Impression / Assessment and Plan / ED Course  I have reviewed the triage vital signs and the nursing notes.  Pertinent labs & imaging results that were available during my care of the patient were reviewed by me and considered in my medical decision making (see chart for details).  Clinical Course    Patient with history of sickle cell disease here with bilateral ear pain with a lump in her anterior throat. She is in no distress in the emergency department and nontoxic appearing.  No evidence of acute otitis media, RPA, TPA, epiglottitis, thyroiditis. Her oxygen and levels are low, about 90% on room air and she states these are normal for her.  Urine pregnancy and TSH obtained given history of missed menses. Chest x-ray obtained to evaluate for pneumonia or acute chest.  Chest x-ray clear and improved from prior. Discussed outpatient unclear source of symptoms. No evidence acute infection at this time. Plan to DC home with outpatient follow-up and return precautions. Discussed with patient following up with her PCP regarding her thyroid study results.  Final Clinical Impressions(s) / ED Diagnoses   Final diagnoses:  Otalgia, bilateral    New Prescriptions Discharge Medication List as of 06/21/2016 10:37 AM       Tilden Fossa, MD 06/21/16 [phone]

## 2016-06-21 NOTE — ED Notes (Signed)
Patient transported to X-ray 

## 2016-07-08 ENCOUNTER — Telehealth: Payer: Self-pay

## 2016-07-08 ENCOUNTER — Ambulatory Visit: Payer: Medicaid Other | Admitting: Neurology

## 2016-07-08 NOTE — Telephone Encounter (Signed)
Refill request for oxycontin, oxycodone, and phenergan. Please advise.

## 2016-07-09 ENCOUNTER — Other Ambulatory Visit: Payer: Self-pay | Admitting: Family Medicine

## 2016-07-09 MED ORDER — PROMETHAZINE HCL 25 MG PO TABS: ORAL_TABLET | ORAL | 0 refills | Status: DC

## 2016-07-09 MED ORDER — OXYCODONE HCL 10 MG PO TABS: 10.0000 mg | ORAL_TABLET | ORAL | 0 refills | Status: DC | PRN

## 2016-07-09 MED ORDER — OXYCODONE HCL ER 30 MG PO T12A: 1.0000 | EXTENDED_RELEASE_TABLET | Freq: Two times a day (BID) | ORAL | 0 refills | Status: DC

## 2016-07-12 ENCOUNTER — Other Ambulatory Visit: Payer: Self-pay | Admitting: Family Medicine

## 2016-07-12 MED ORDER — OXYCODONE HCL 10 MG PO TABS: 10.0000 mg | ORAL_TABLET | ORAL | 0 refills | Status: DC | PRN

## 2016-07-12 MED ORDER — PROMETHAZINE HCL 25 MG PO TABS: ORAL_TABLET | ORAL | 0 refills | Status: DC

## 2016-07-12 MED ORDER — OXYCODONE HCL ER 30 MG PO T12A: 1.0000 | EXTENDED_RELEASE_TABLET | Freq: Two times a day (BID) | ORAL | 0 refills | Status: DC

## 2016-07-16 ENCOUNTER — Telehealth: Payer: Self-pay

## 2016-07-16 NOTE — Telephone Encounter (Signed)
Called and spoke to patient she states she has had episodes of being dizziness and labored breathing. She reports that her last episode was last night. She currently denies sob/chest pain/dizzines/ pain. I advised patient that if any of these symptoms happen again she would need to be evaluated during the episode and would need to call or report to ER for evaluation. Patient verbalized understanding and is aware of her regular appointment on 07/30/2016 and will also discuss this with provider at that appointment. Thanks!

## 2016-07-30 ENCOUNTER — Telehealth: Payer: Self-pay

## 2016-07-30 ENCOUNTER — Encounter: Payer: Self-pay | Admitting: Internal Medicine

## 2016-07-30 ENCOUNTER — Ambulatory Visit (INDEPENDENT_AMBULATORY_CARE_PROVIDER_SITE_OTHER): Payer: Medicaid Other | Admitting: Internal Medicine

## 2016-07-30 VITALS — BP 92/71 | HR 73 | Temp 98.4°F | Resp 18 | Ht 64.0 in | Wt 150.0 lb

## 2016-07-30 DIAGNOSIS — R531 Weakness: Secondary | ICD-10-CM | POA: Insufficient documentation

## 2016-07-30 DIAGNOSIS — M21372 Foot drop, left foot: Secondary | ICD-10-CM | POA: Diagnosis not present

## 2016-07-30 DIAGNOSIS — R42 Dizziness and giddiness: Secondary | ICD-10-CM

## 2016-07-30 DIAGNOSIS — Z0000 Encounter for general adult medical examination without abnormal findings: Secondary | ICD-10-CM | POA: Insufficient documentation

## 2016-07-30 DIAGNOSIS — D571 Sickle-cell disease without crisis: Secondary | ICD-10-CM

## 2016-07-30 MED ORDER — DICLOFENAC SODIUM 1 % TD GEL: 2.0000 g | Freq: Four times a day (QID) | TRANSDERMAL | 2 refills | Status: DC

## 2016-07-30 MED ORDER — OXYCODONE HCL 10 MG PO TABS: 10.0000 mg | ORAL_TABLET | ORAL | 0 refills | Status: DC | PRN

## 2016-07-30 MED ORDER — OXYCONTIN 30 MG PO T12A: 1.0000 | EXTENDED_RELEASE_TABLET | Freq: Two times a day (BID) | ORAL | 0 refills | Status: DC

## 2016-07-30 NOTE — Progress Notes (Signed)
Debra Williamson Williamson, is a 35 y.o. female  ZOX:[id number]:[id number]  [id number]:[id number]  DOB - May 14, 1981  Chief Complaint  Patient presents with  . Follow-up      Subjective:   Debra Williamson is a 35 y.o. 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 She was supposed to follow up with Neurologist but missed her appointments and now need another referral. She is requesting Orthopedist referral for her left foot drop from the CVA, she requires review of her ankle braces. She has no new complaints today, she desires to start getting her pain medications from this clinic, she needs them refilled today. She is currently on Lamictal 200 mg 2x/day and Keppra 1000mg  2x/day. Her pain meds include OxyContin 30 mg Q12H and Oxycodone 10 mg Q4H PRN. Patient has No headache, No chest pain, No abdominal pain - No Nausea, No Cough - SOB. She is currently seizure free.  Problem  Healthcare Maintenance  Dizziness, Nonspecific  Left Foot Drop  Left-Sided Weakness  Hb-Ss Disease Without Crisis (Hcc)   ALLERGIES: Allergies  Allergen Reactions  . Ibuprofen Hives  . Zofran [Ondansetron Hcl] Hives and Itching  . Demerol [Meperidine] Other (See Comments)    Pt has seizures  . Fentanyl And Related Nausea And Vomiting and Other (See Comments)    Very sedated  **Not allergic to the injection** JUST ALLERGIC TO PATCH  . Other Itching    Greek Yogurt  . Codeine Hives and Itching  . Hydrocodone Hives and Itching    PAST MEDICAL HISTORY: Past Medical History:  Diagnosis Date  . Anemia   . Blood transfusion without reported diagnosis   . Seizures (HCC)    one time incident, may have been r/t to a pain medication she was prescribed  . Sickle cell disease (HCC)   . Stroke [facility](HCC)     MEDICATIONS AT HOME: Prior to Admission medications   Medication Sig  Start Date End Date Taking? Authorizing Provider  aspirin EC 81 MG tablet Take 1 tablet by mouth daily. 11/03/13   Historical Provider, MD  folic acid (FOLVITE) 1 MG tablet Take 1 mg by mouth daily.  02/13/15   Historical Provider, MD  lamoTRIgine (LAMICTAL) 200 MG tablet Take 1 tablet (200 mg total) by mouth 2 (two) times daily. 01/02/16   Van ClinesKaren M Aquino, MD  levETIRAcetam (KEPPRA) 1000 MG tablet Take 1 tablet (1,000 mg total) by mouth 2 (two) times daily. 01/02/16   Van ClinesKaren M Aquino, MD  Oxycodone HCl 10 MG TABS Take 1 tablet (10 mg total) by mouth every 4 (four) hours as needed (pain). 07/30/16 08/14/16  Sarinah Doetsch Annitta NeedsE Jamond Neels, MD  OXYCONTIN 30 MG 12 hr tablet Take 1 tablet by mouth 2 (two) times daily. 07/30/16 08/29/16  Quentin Angstlugbemiga E Maximillion Gill, MD  promethazine (PHENERGAN) 25 MG tablet 1 tablet every 8 hours prn 07/12/16   Henrietta HooverLinda C Bernhardt, NP  zolpidem (AMBIEN) 5 MG tablet Take 1 tablet (5 mg total) by mouth at bedtime as needed. sleep Patient not taking: Reported on 06/21/2016 03/13/15   Altha HarmMichelle A Matthews, MD    Objective:   Vitals:   07/30/16 0927  BP: 92/71  Pulse: 73  Resp: 18  Temp: 98.4 F (36.9 C)  TempSrc: Oral  SpO2: 94%  Weight: 150 lb (68 kg)  Height: 5\' 4"  (1.626 m)   Exam General  appearance : Awake, alert, not in any distress. Speech Clear. Not toxic looking HEENT: Atraumatic and Normocephalic, pupils equally reactive to light and accomodation Neck: Supple, no JVD. No cervical lymphadenopathy.  Chest: Good air entry bilaterally, no added sounds  CVS: S1 S2 regular, no murmurs.  Abdomen: Bowel sounds present, Non tender and not distended with no gaurding, rigidity or rebound. Extremities: B/L Lower Ext shows no edema, both legs are warm to touch Motor: 5/5 on right UE and LE, 5/5 on proximal left UE, fingers flexed on left hand with 1/5 finger extension and 3/5 wrist extension; 5/5 proximal left LE, 0/5 left foot dorsi and plantar flexion wearing ankle brace. Plantar responses:  downgoing on right, upgoing on left Neurology: Awake alert, and oriented X 3, CN II-XII intact, Non focal Skin: No Rash  Data Review No results found for: HGBA1C  Assessment & Plan   1. Healthcare maintenance  - Flu Vaccine QUAD 36+ mos PF IM (Fluarix & Fluzone Quad PF) - Ambulatory referral to Gynecology  2. Hb-SS disease without crisis (HCC)  - Oxycodone HCl 10 MG TABS; Take 1 tablet (10 mg total) by mouth every 4 (four) hours as needed (pain).  Dispense: 90 tablet; Refill: 0 - OXYCONTIN 30 MG 12 hr tablet; Take 1 tablet by mouth 2 (two) times daily.  Dispense: 60 each; Refill: 0  Patient is not on Hydrea. We discussed the need for good hydration, monitoring of hydration status, avoidance of heat, cold, stress, and infection triggers. Continue folic acid 1 mg daily to prevent aplastic bone marrow crises.  Acute and chronic painful episodes - We agreed on Opiate dose and amount of pills  per month. We discussed that pt is to receive Schedule II prescriptions only from our clinic. Pt is also aware that the prescription history is available to [place] online through the [facility] CSRS. Controlled substance agreement reviewed and signed. We reminded Debra Williamson that all patients receiving Schedule II narcotics must be seen for follow within one month of prescription being requested. We reviewed the terms of our pain agreement, including the need to keep medicines in a safe locked location away from children or pets, and the need to report excess sedation or constipation, measures to avoid constipation, and policies related to early refills and stolen prescriptions. According to the [place] [facility] program, we have reviewed details related to analgesia, adverse effects and aberrant behaviors.   3. Dizziness, nonspecific  - Ambulatory referral to Neurology  4. Left foot drop  - Ambulatory referral to Neurology - Ambulatory referral to Orthopedic Surgery  5. Left-sided weakness  -  Ambulatory referral to Neurology  Patient have been counseled extensively about nutrition and exercise. Other issues discussed during this visit include: low cholesterol diet, weight control and daily exercise, foot care, annual eye examinations at Ophthalmology, importance of adherence with medications and regular follow-up.  Return in about 3 months (around 10/30/2016) for Sickle Cell Disease/Pain, Follow up Pain and comorbidities.  The patient was given clear instructions to go to ER or return to medical center if symptoms don't improve, worsen or new problems develop. The patient verbalized understanding. The patient was told to call to get lab results if they haven't heard anything in the next week.   This note has been created with Education officer, environmental. Any transcriptional errors are unintentional.    Jeanann Lewandowsky, MD, MHA, FACP, FAAP, CPE [facility] and [facility] [place], [place] [phone]  07/30/2016, 10:38 AM

## 2016-07-30 NOTE — Progress Notes (Signed)
Patient is here for SCD  Patient denies pain at this time.  Patient has not taken medication today and patient has not eaten today.  Patient would like the flu vaccine today. Patient tolerated flu vaccine well today.

## 2016-07-30 NOTE — Patient Instructions (Signed)
Dizziness Dizziness is a common problem. It is a feeling of unsteadiness or light-headedness. You may feel like you are about to faint. Dizziness can lead to injury if you stumble or fall. Anyone can become dizzy, but dizziness is more common in older adults. This condition can be caused by a number of things, including medicines, dehydration, or illness. HOME CARE INSTRUCTIONS Taking these steps may help with your condition: Eating and Drinking  Drink enough fluid to keep your urine clear or pale yellow. This helps to keep you from becoming dehydrated. Try to drink more clear fluids, such as water.  Do not drink alcohol.  Limit your caffeine intake if directed by your health care provider.  Limit your salt intake if directed by your health care provider. Activity  Avoid making quick movements.  Rise slowly from chairs and steady yourself until you feel okay.  In the morning, first sit up on the side of the bed. When you feel okay, stand slowly while you hold onto something until you know that your balance is fine.  Move your legs often if you need to stand in one place for a long time. Tighten and relax your muscles in your legs while you are standing.  Do not drive or operate heavy machinery if you feel dizzy.  Avoid bending down if you feel dizzy. Place items in your home so that they are easy for you to reach without leaning over. Lifestyle  Do not use any tobacco products, including cigarettes, chewing tobacco, or electronic cigarettes. If you need help quitting, ask your health care provider.  Try to reduce your stress level, such as with yoga or meditation. Talk with your health care provider if you need help. General Instructions  Watch your dizziness for any changes.  Take medicines only as directed by your health care provider. Talk with your health care provider if you think that your dizziness is caused by a medicine that you are taking.  Tell a friend or a family  member that you are feeling dizzy. If he or she notices any changes in your behavior, have this person call your health care provider.  Keep all follow-up visits as directed by your health care provider. This is important. SEEK MEDICAL CARE IF:  Your dizziness does not go away.  Your dizziness or light-headedness gets worse.  You feel nauseous.  You have reduced hearing.  You have new symptoms.  You are unsteady on your feet or you feel like the room is spinning. SEEK IMMEDIATE MEDICAL CARE IF:  You vomit or have diarrhea and are unable to eat or drink anything.  You have problems talking, walking, swallowing, or using your arms, hands, or legs.  You feel generally weak.  You are not thinking clearly or you have trouble forming sentences. It may take a friend or family member to notice this.  You have chest pain, abdominal pain, shortness of breath, or sweating.  Your vision changes.  You notice any bleeding.  You have a headache.  You have neck pain or a stiff neck.  You have a fever.   This information is not intended to replace advice given to you by your health care provider. Make sure you discuss any questions you have with your health care provider.   Document Released: 04/02/2001 Document Revised: 02/21/2015 Document Reviewed: 10/03/2014 Elsevier Interactive Patient Education 2016 [employer] Inc[employer] Sickle Cell Anemia, Adult Sickle cell anemia is a condition in which red blood cells have an abnormal "sickle"  shape. This abnormal shape shortens the cells' life span, which results in a lower than normal concentration of red blood cells in the blood. The sickle shape also causes the cells to clump together and block free blood flow through the blood vessels. As a result, the tissues and organs of the body do not receive enough oxygen. Sickle cell anemia causes organ damage and pain and increases the risk of infection. CAUSES  Sickle cell anemia is a genetic disorder.  Those who receive two copies of the gene have the condition, and those who receive one copy have the trait. RISK FACTORS The sickle cell gene is most common in people whose families originated in [place]. Other areas of the globe where sickle cell trait occurs include the [place], [place], the [place], and the [place].  SIGNS AND SYMPTOMS  Pain, especially in the extremities, back, chest, or abdomen (common). The pain may start suddenly or may develop following an illness, especially if there is dehydration. Pain can also occur due to overexertion or exposure to extreme temperature changes.  Frequent severe bacterial infections, especially certain types of pneumonia and meningitis.  Pain and swelling in the hands and feet.  Decreased activity.   Loss of appetite.   Change in behavior.  Headaches.  Seizures.  Shortness of breath or difficulty breathing.  Vision changes.  Skin ulcers. Those with the trait may not have symptoms or they may have mild symptoms.  DIAGNOSIS  Sickle cell anemia is diagnosed with blood tests that demonstrate the genetic trait. It is often diagnosed during the newborn period, due to mandatory testing nationwide. A variety of blood tests, X-rays, CT scans, MRI scans, ultrasounds, and lung function tests may also be done to monitor the condition. TREATMENT  Sickle cell anemia may be treated with:  Medicines. You may be given pain medicines, antibiotic medicines (to treat and prevent infections) or medicines to increase the production of certain types of hemoglobin.  Fluids.  Oxygen.  Blood transfusions. HOME CARE INSTRUCTIONS   Drink enough fluid to keep your urine clear or pale yellow. Increase your fluid intake in hot weather and during exercise.  Do not smoke. Smoking lowers oxygen levels in the blood.   Only take over-the-counter or prescription medicines for pain, fever, or discomfort as directed by your health  care provider.  Take antibiotics as directed by your health care provider. Make sure you finish them it even if you start to feel better.   Take supplements as directed by your health care provider.   Consider wearing a medical alert bracelet. This tells anyone caring for you in an emergency of your condition.   When traveling, keep your medical information, health care provider's names, and the medicines you take with you at all times.   If you develop a fever, do not take medicines to reduce the fever right away. This could cover up a problem that is developing. Notify your health care provider.  Keep all follow-up appointments with your health care provider. Sickle cell anemia requires regular medical care. SEEK MEDICAL CARE IF: You have a fever. SEEK IMMEDIATE MEDICAL CARE IF:   You feel dizzy or faint.   You have new abdominal pain, especially on the left side near the stomach area.   You develop a persistent, often uncomfortable and painful penile erection (priapism). If this is not treated immediately it will lead to impotence.   You have numbness your arms or legs or you have a hard  time moving them.   You have a hard time with speech.   You have a fever or persistent symptoms for more than 2-3 days.   You have a fever and your symptoms suddenly get worse.   You have signs or symptoms of infection. These include:   Chills.   Abnormal tiredness (lethargy).   Irritability.   Poor eating.   Vomiting.   You develop pain that is not helped with medicine.   You develop shortness of breath.  You have pain in your chest.   You are coughing up pus-like or bloody sputum.   You develop a stiff neck.  Your feet or hands swell or have pain.  Your abdomen appears bloated.  You develop joint pain. MAKE SURE YOU:  Understand these instructions.   This information is not intended to replace advice given to you by your health care provider.  Make sure you discuss any questions you have with your health care provider.   Document Released: 01/15/2006 Document Revised: 10/28/2014 Document Reviewed: 05/19/2013 Elsevier Interactive Patient Education [employer]

## 2016-08-07 ENCOUNTER — Ambulatory Visit: Payer: Medicaid Other | Admitting: Student

## 2016-08-13 ENCOUNTER — Telehealth (HOSPITAL_COMMUNITY): Payer: Self-pay | Admitting: Hematology

## 2016-08-13 NOTE — Telephone Encounter (Signed)
Returned patient's call in regards to her question about a blood transfusion.  Patient states she saw doctor and established care on 07/30/16.  I advised at this time she is to follow the plan he gave her at her office visit, and take medications as prescribed.  Explained to keep her follow up appointment in January.  Advised if anything changed or she experienced acute sickle cell crisis to call the office.  Patient verbalizes understanding.

## 2016-08-23 ENCOUNTER — Ambulatory Visit: Payer: Medicaid Other | Admitting: Neurology

## 2016-08-30 ENCOUNTER — Encounter: Payer: Self-pay | Admitting: Neurology

## 2016-09-06 ENCOUNTER — Emergency Department (HOSPITAL_COMMUNITY): Payer: Medicaid Other

## 2016-09-06 ENCOUNTER — Encounter (HOSPITAL_COMMUNITY): Payer: Self-pay | Admitting: Nurse Practitioner

## 2016-09-06 ENCOUNTER — Inpatient Hospital Stay (HOSPITAL_COMMUNITY)
Admission: EM | Admit: 2016-09-06 | Discharge: 2016-09-10 | DRG: 150 | Disposition: A | Discharge status: Home / Self Care | Payer: Medicaid Other | Attending: Internal Medicine | Admitting: Internal Medicine

## 2016-09-06 DIAGNOSIS — D72829 Elevated white blood cell count, unspecified: Secondary | ICD-10-CM | POA: Diagnosis present

## 2016-09-06 DIAGNOSIS — R040 Epistaxis: Principal | ICD-10-CM | POA: Diagnosis present

## 2016-09-06 DIAGNOSIS — Z79899 Other long term (current) drug therapy: Secondary | ICD-10-CM

## 2016-09-06 DIAGNOSIS — M21372 Foot drop, left foot: Secondary | ICD-10-CM | POA: Diagnosis present

## 2016-09-06 DIAGNOSIS — R0902 Hypoxemia: Secondary | ICD-10-CM | POA: Diagnosis not present

## 2016-09-06 DIAGNOSIS — R42 Dizziness and giddiness: Secondary | ICD-10-CM | POA: Diagnosis not present

## 2016-09-06 DIAGNOSIS — Z8673 Personal history of transient ischemic attack (TIA), and cerebral infarction without residual deficits: Secondary | ICD-10-CM | POA: Diagnosis not present

## 2016-09-06 DIAGNOSIS — G40909 Epilepsy, unspecified, not intractable, without status epilepticus: Secondary | ICD-10-CM

## 2016-09-06 DIAGNOSIS — R531 Weakness: Secondary | ICD-10-CM | POA: Diagnosis not present

## 2016-09-06 DIAGNOSIS — D649 Anemia, unspecified: Secondary | ICD-10-CM | POA: Diagnosis present

## 2016-09-06 DIAGNOSIS — D5700 Hb-SS disease with crisis, unspecified: Secondary | ICD-10-CM | POA: Diagnosis present

## 2016-09-06 DIAGNOSIS — I69354 Hemiplegia and hemiparesis following cerebral infarction affecting left non-dominant side: Secondary | ICD-10-CM

## 2016-09-06 DIAGNOSIS — R0602 Shortness of breath: Secondary | ICD-10-CM | POA: Diagnosis not present

## 2016-09-06 DIAGNOSIS — I69398 Other sequelae of cerebral infarction: Secondary | ICD-10-CM

## 2016-09-06 DIAGNOSIS — D62 Acute posthemorrhagic anemia: Secondary | ICD-10-CM | POA: Diagnosis present

## 2016-09-06 DIAGNOSIS — G459 Transient cerebral ischemic attack, unspecified: Secondary | ICD-10-CM

## 2016-09-06 DIAGNOSIS — R0600 Dyspnea, unspecified: Secondary | ICD-10-CM | POA: Diagnosis present

## 2016-09-06 DIAGNOSIS — R2681 Unsteadiness on feet: Secondary | ICD-10-CM

## 2016-09-06 DIAGNOSIS — Z7982 Long term (current) use of aspirin: Secondary | ICD-10-CM

## 2016-09-06 LAB — CBC WITH DIFFERENTIAL/PLATELET
Basophils Absolute: 0 10*3/uL (ref 0.0–0.1)
Basophils Relative: 0 %
Eosinophils Absolute: 0.2 10*3/uL (ref 0.0–0.7)
Eosinophils Relative: 1 %
HCT: 17.2 % — ABNORMAL LOW (ref 36.0–46.0)
Hemoglobin: 6.3 g/dL — CL (ref 12.0–15.0)
Lymphocytes Relative: 17 %
Lymphs Abs: 2.9 10*3/uL (ref 0.7–4.0)
MCH: 37.5 pg — ABNORMAL HIGH (ref 26.0–34.0)
MCHC: 36.6 g/dL — ABNORMAL HIGH (ref 30.0–36.0)
MCV: 102.4 fL — ABNORMAL HIGH (ref 78.0–100.0)
Monocytes Absolute: 1.7 10*3/uL — ABNORMAL HIGH (ref 0.1–1.0)
Monocytes Relative: 10 %
Neutro Abs: 12.4 10*3/uL — ABNORMAL HIGH (ref 1.7–7.7)
Neutrophils Relative %: 72 %
Platelets: 387 10*3/uL (ref 150–400)
RBC: 1.68 MIL/uL — ABNORMAL LOW (ref 3.87–5.11)
RDW: 21.5 % — ABNORMAL HIGH (ref 11.5–15.5)
WBC: 17.2 10*3/uL — ABNORMAL HIGH (ref 4.0–10.5)
nRBC: 2 /100 WBC — ABNORMAL HIGH

## 2016-09-06 LAB — I-STAT BETA HCG BLOOD, ED (MC, WL, AP ONLY): I-stat hCG, quantitative: <5 m[IU]/mL (ref <5)

## 2016-09-06 LAB — COMPREHENSIVE METABOLIC PANEL
ALT: 44 U/L (ref 14–54)
AST: 69 U/L — ABNORMAL HIGH (ref 15–41)
Albumin: 4.5 g/dL (ref 3.5–5.0)
Alkaline Phosphatase: 72 U/L (ref 38–126)
Anion gap: 7 (ref 5–15)
BUN: 9 mg/dL (ref 6–20)
CO2: 25 mmol/L (ref 22–32)
Calcium: 9.5 mg/dL (ref 8.9–10.3)
Chloride: 105 mmol/L (ref 101–111)
Creatinine, Ser: 0.6 mg/dL (ref 0.44–1.00)
GFR calc Af Amer: >60 mL/min (ref >60)
GFR calc non Af Amer: >60 mL/min (ref >60)
Glucose, Bld: 105 mg/dL — ABNORMAL HIGH (ref 65–99)
Potassium: 4.5 mmol/L (ref 3.5–5.1)
Sodium: 137 mmol/L (ref 135–145)
Total Bilirubin: 3 mg/dL — ABNORMAL HIGH (ref 0.3–1.2)
Total Protein: 8.1 g/dL (ref 6.5–8.1)

## 2016-09-06 LAB — RETICULOCYTES
RBC.: 1.68 MIL/uL — ABNORMAL LOW (ref 3.87–5.11)
Retic Ct Pct: >23 % — ABNORMAL HIGH (ref 0.4–3.1)

## 2016-09-06 MED ORDER — HYDROMORPHONE HCL 1 MG/ML IJ SOLN
0.5000 mg | Freq: Once | INTRAMUSCULAR | Status: AC
  Administered 2016-09-07: 0.5 mg via INTRAVENOUS
  Filled 2016-09-06: qty 1

## 2016-09-06 MED ORDER — DIPHENHYDRAMINE HCL 50 MG/ML IJ SOLN
25.0000 mg | Freq: Once | INTRAMUSCULAR | Status: DC
  Filled 2016-09-06: qty 1

## 2016-09-06 MED ORDER — IOPAMIDOL (ISOVUE-370) INJECTION 76%
INTRAVENOUS | Status: AC
  Filled 2016-09-06: qty 100

## 2016-09-06 MED ORDER — MORPHINE SULFATE (PF) 4 MG/ML IV SOLN
4.0000 mg | Freq: Once | INTRAVENOUS | Status: DC
  Filled 2016-09-06: qty 1

## 2016-09-06 MED ORDER — SODIUM CHLORIDE 0.9 % IJ SOLN
INTRAMUSCULAR | Status: AC
  Filled 2016-09-06: qty 50

## 2016-09-06 MED ORDER — DIPHENHYDRAMINE HCL 50 MG/ML IJ SOLN
25.0000 mg | Freq: Once | INTRAMUSCULAR | Status: AC
  Administered 2016-09-07: 25 mg via INTRAVENOUS
  Filled 2016-09-06: qty 1

## 2016-09-06 MED ORDER — DIPHENHYDRAMINE HCL 50 MG/ML IJ SOLN
25.0000 mg | Freq: Once | INTRAMUSCULAR | Status: DC
Start: 2016-09-07 — End: 2016-09-06

## 2016-09-06 MED ORDER — SODIUM CHLORIDE 0.9 % IV BOLUS (SEPSIS)
1000.0000 mL | Freq: Once | INTRAVENOUS | Status: AC
  Administered 2016-09-06: 1000 mL via INTRAVENOUS

## 2016-09-06 MED ORDER — LEVETIRACETAM 500 MG PO TABS
1000.0000 mg | ORAL_TABLET | Freq: Once | ORAL | Status: AC
  Administered 2016-09-07: 1000 mg via ORAL
  Filled 2016-09-06: qty 2

## 2016-09-06 MED ORDER — IOPAMIDOL (ISOVUE-370) INJECTION 76%
100.0000 mL | Freq: Once | INTRAVENOUS | Status: AC | PRN
  Administered 2016-09-07: 100 mL via INTRAVENOUS

## 2016-09-06 MED ORDER — PROMETHAZINE HCL 25 MG/ML IJ SOLN
12.5000 mg | Freq: Once | INTRAMUSCULAR | Status: AC
Start: 2016-09-07 — End: 2016-09-07
  Administered 2016-09-07: 12.5 mg via INTRAVENOUS
  Filled 2016-09-06: qty 1

## 2016-09-06 MED ORDER — LAMOTRIGINE 100 MG PO TABS
200.0000 mg | ORAL_TABLET | Freq: Once | ORAL | Status: AC
Start: 2016-09-07 — End: 2016-09-07
  Administered 2016-09-07: 200 mg via ORAL
  Filled 2016-09-06: qty 2

## 2016-09-06 NOTE — ED Notes (Signed)
Attempted to get labs on pt.unsuccessful.  Pt has a port. RN aware

## 2016-09-06 NOTE — ED Provider Notes (Addendum)
[facility]-EMERGENCY DEPT Provider Note   CSN: [id number] Arrival date & time: 09/06/16  2112     History   Chief Complaint Chief Complaint  Patient presents with  . Dizziness    HPI Debra Williamson is a 35 y.o. female.  HPI   Patient is a 35 year old female with past medical history significant for sickle cell, stroke with residual left hemiparesis, presenting today with intermittent dizziness. Patient reports that she has occasional dizziness over the last month and half. She reports nothing makes it better or worse. It is not positional. It has been getting worse in the last several days.  It is nonexertional. Patient can be sitting still and become dizzy,  if she relaxes and closes eyes  she feels better.  Past Medical History:  Diagnosis Date  . Anemia   . Blood transfusion without reported diagnosis   . Seizures (HCC)    one time incident, may have been r/t to a pain medication she was prescribed  . Sickle cell disease (HCC)   . Stroke [facility](HCC)     Patient Active Problem List   Diagnosis Date Noted  . Lightheadedness 09/06/2016  . Healthcare maintenance 07/30/2016  . Dizziness, nonspecific 07/30/2016  . Left foot drop 07/30/2016  . Left-sided weakness 07/30/2016  . 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 History of stroke associated with blood clotting tendency 01/02/2016  . [place] disease (HCC) 10/27/2015  . Hb-SS disease with acute chest syndrome (HCC) 08/14/2015  . Acute chest syndrome (HCC)   . Acute respiratory failure with hypoxemia (HCC)   . Community acquired pneumonia 08/08/2015  . Hemiparesis following cerebrovascular accident (CVA) (HCC) 05/30/2015  . Ankle gives out 05/30/2015  . Seizure disorder (HCC) 05/30/2015  . 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 H/O: stroke 03/12/2015  . Seizures (HCC) 03/12/2015  . 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 Sickle cell crisis (HCC) 03/11/2015  . Symptomatic anemia 03/11/2015    Past Surgical History:  Procedure Laterality Date  . CESAREAN SECTION     x4  . CHOLECYSTECTOMY    . PORTACATH PLACEMENT Right w-3  . reverse tubal ligation      OB History    No data available       Home Medications    Prior to Admission medications   Medication Sig Start Date End Date Taking? Authorizing Provider  Ascorbic Acid (VITAMIN C) 1000 MG tablet Take 1,000 mg by mouth every evening.   Yes Historical Provider, MD  aspirin EC 81 MG tablet Take 1 tablet by mouth every evening.  11/03/13  Yes Historical Provider, MD  folic acid (FOLVITE) 1 MG tablet Take 1 mg by mouth daily.  02/13/15  Yes Historical Provider, MD  lamoTRIgine (LAMICTAL) 200 MG tablet Take 1 tablet (200 mg total) by mouth 2 (two) times daily. 01/02/16  Yes Van ClinesKaren M Aquino, MD  levETIRAcetam (KEPPRA) 1000 MG tablet Take 1 tablet (1,000 mg total) by mouth 2 (two) times daily. 01/02/16  Yes Van ClinesKaren M Aquino, MD  promethazine (PHENERGAN) 25 MG tablet 1 tablet every 8 hours prn Patient taking differently: Take 25 mg by mouth every 8 (eight) hours as needed for nausea or vomiting. 1 tablet every 8 hours prn 07/12/16  Yes Henrietta HooverLinda C Bernhardt, NP  diclofenac sodium (VOLTAREN) 1 % GEL Apply 2 g topically 4 (four) times daily. Patient not taking: Reported on 09/06/2016 07/30/16   Olugbemiga E  Hyman HopesJegede, MD  zolpidem (AMBIEN) 5 MG tablet Take 1 tablet (5 mg total) by mouth at bedtime as needed. sleep Patient not taking: Reported on 09/06/2016 03/13/15   Altha HarmMichelle A Matthews, MD    Family History Family History  Problem Relation Age of Onset  . HIV/AIDS Father     Social History Social History  Substance Use Topics  . Smoking status: Never Smoker  . Smokeless tobacco: Never Used  . Alcohol use No     Comment: occasionally     Allergies   Ibuprofen; Zofran [ondansetron hcl]; Demerol [meperidine]; Fentanyl and  related; Other; Codeine; and Hydrocodone   Review of Systems Review of Systems  Constitutional: Negative for fatigue and fever.  Respiratory: Negative for cough and shortness of breath.   Cardiovascular: Negative for chest pain.  Gastrointestinal: Negative for abdominal pain.  Neurological: Positive for dizziness. Negative for facial asymmetry and headaches.  All other systems reviewed and are negative.    Physical Exam Updated Vital Signs LMP 08/23/2016   Physical Exam  Constitutional: She is oriented to person, place, and time. She appears well-developed and well-nourished.  HENT:  Head: Normocephalic and atraumatic.  Bilateral TMs are clear.  Eyes: Right eye exhibits no discharge.  Cardiovascular: Normal rate, regular rhythm and normal heart sounds.   No murmur heard. Pulmonary/Chest: Effort normal and breath sounds normal. She has no wheezes. She has no rales.  Abdominal: Soft. She exhibits no distension. There is no tenderness.  Neurological: She is oriented to person, place, and time. No cranial nerve deficit. Coordination normal.  Left hemiparesis at baseline. No nystagmus. No facial droop. Cranial nerves appear intact. Finger to nose on the right-hand side is normal.  Skin: Skin is warm and dry. She is not diaphoretic.  Psychiatric: She has a normal mood and affect.  Nursing note and vitals reviewed.    ED Treatments / Results  Labs (all labs ordered are listed, but only abnormal results are displayed) Labs Reviewed  COMPREHENSIVE METABOLIC PANEL - Abnormal; Notable for the following:       Result Value   Glucose, Bld 105 (*)    AST 69 (*)    Total Bilirubin 3.0 (*)    All other components within normal limits  CBC WITH DIFFERENTIAL/PLATELET - Abnormal; Notable for the following:    WBC 17.2 (*)    RBC 1.68 (*)    Hemoglobin 6.3 (*)    HCT 17.2 (*)    MCV 102.4 (*)    MCH 37.5 (*)    MCHC 36.6 (*)    RDW 21.5 (*)    nRBC 2 (*)    Neutro Abs 12.4 (*)     Monocytes Absolute 1.7 (*)    All other components within normal limits  RETICULOCYTES - Abnormal; Notable for the following:    Retic Ct Pct >23.0 (*)    RBC. 1.68 (*)    All other components within normal limits  I-STAT BETA HCG BLOOD, ED ([facility], [facility], [facility] ONLY)  TYPE AND SCREEN    EKG  EKG Interpretation  Date/Time:  Friday September 06 2016 21:29:31 EST Ventricular Rate:  98 PR Interval:    QRS Duration: 82 QT Interval:  344 QTC Calculation: 440 R Axis:   44 Text Interpretation:  Sinus rhythm Abnormal R-wave progression, early transition No significant change since last tracing Confirmed by Kandis MannanMACKUEN, COURTNEY ([id number]) on 09/06/2016 10:48:21 PM       Radiology Dg Chest 2 View  Result Date: 09/06/2016 CLINICAL DATA:  Shortness of breath and dizziness. Sickle cell disease. EXAM: CHEST  2 VIEW COMPARISON:  Chest radiograph 06/21/2016 FINDINGS: Dual-lumen right chest wall Port-A-Cath is in unchanged position with tip at the cavoatrial junction. Cardiomediastinal contours are normal. No pleural effusion or pneumothorax. No focal airspace consolidation or pulmonary edema. IMPRESSION: No active cardiopulmonary disease. Electronically Signed   By: Deatra Robinson M.D.   On: 09/06/2016 22:20    Procedures Procedures (including critical care time)  Medications Ordered in ED Medications  morphine 4 MG/ML injection 4 mg (not administered)  diphenhydrAMINE (BENADRYL) injection 25 mg (not administered)  sodium chloride 0.9 % bolus 1,000 mL (1,000 mLs Intravenous New Bag/Given 09/06/16 2252)     Initial Impression / Assessment and Plan / ED Course  I have reviewed the triage vital signs and the nursing notes.  Pertinent labs & imaging results that were available during my care of the patient were reviewed by me and considered in my medical decision making (see chart for details).  Clinical Course     Patient is a 35 year old female with history of sickle cell and stroke with residual left  hemiparesis. She is presenting today with intermittent dizziness over the last month and half. Patient reports nothing makes it better or worse. It is not positional. Nonexertional. I'm unsure what is causing the dizziness however with a waxing waning course it is less likely to be stroke. Do not think a workup for stroke is appropriate given the fact that it has not been constant..  Patient has a neurologist for which she follows up.  We will get chest x-ray, EKG, initial labs given the patient has not had recent lab work. If all normal will have patient follow up with neurology for further characterization of this dizziness  11:36 PM Patient's hgb is acutely lower than average.  Last time she had a hgb this low she was admitted for acute chest and transfused. No chest pain.  Just walked back into patient's room to review results, and she is on oxygen. She states she had shortness of breath. No record of hypoxia and no current tachypnea.  Boyfriend now in the room and also reports she has been leaning to the left and also needing to rest secondary to SOB.   Will get PE study, will admit for transfusion because she is symptomatic from this hgb level.   Final Clinical Impressions(s) / ED Diagnoses   Final diagnoses:  None    New Prescriptions New Prescriptions   No medications on file     Fara Worthy Randall An, MD 09/06/16 2319    Jeffery Gammell Randall An, MD 09/06/16 2336

## 2016-09-06 NOTE — H&P (Signed)
Debra Williamson ZOX:[id number] DOB: August 29, 1981 DOA: 09/06/2016     PCP: Concepcion Living, NP   Outpatient Specialists: Neurology Aquino  Patient coming from:   home Lives   With family (boyfriend)   Chief Complaint: Vertigo and joint pain associated with shortness of breath.  HPI: Debra Williamson is a 35 y.o. female with medical history significant of sickle cell disease, stroke at the age of 36 resulting in left-sided weakness, seizure disorder    Presented with vertigo that has been on and off for the past 1 month. She has been feeling weak for the past 1 week.  She's been having intermittent dizziness prior to this not positional but has been getting progressively worse for past few days she feels dizzy when she sits still sometimes but gets better when she closes her eyes and relaxes patient reports she's been having some shortness of breath that has been getting worse. No blood in Stool. NO heavy menses. She has had some joint pain for the past 1 week and occasional headache. She states that her joint pain sometimes can be controlled with her home medications. Denies any chest pain. NO sensation of fullness in her ears. No slurred speech.   Voice different reports she has been leaning to the left somewhat more than usual and been having generalized fatigue. 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 She has sickle cell disorder and had required blood transfusions in the past.  Reports last seizure was  year ago.   IN ER:  No data recorded. sodium 137 6 creatinine 0.60 total bili 3.0 which is up from baseline of 2.6 CBC 17.2 patient has chronic leukocytosis hemoglobin 6.3 which is down from 7.2 in August Retic count elevated chronicaly  Chest x-ray nonacute Following Medications were ordered in ER: Medications  morphine 4 MG/ML  injection 4 mg (not administered)  diphenhydrAMINE (BENADRYL) injection 25 mg (not administered)  sodium chloride 0.9 % bolus 1,000 mL (1,000 mLs Intravenous New Bag/Given 09/06/16 2252)      Hospitalist was called for admission for Symptomatic anemia  Review of Systems:    Pertinent positives include:  Dizziness, fatigue, vertigo, tingling of left arm   Constitutional:  No weight loss, night sweats, Fevers, chills, weight loss  HEENT:  No headaches, Difficulty swallowing,Tooth/dental problems,Sore throat,  No sneezing, itching, ear ache, nasal congestion, post nasal drip,  Cardio-vascular:  No chest pain, Orthopnea, PND, anasarca,, palpitations.no Bilateral lower extremity swelling  GI:  No heartburn, indigestion, abdominal pain, nausea, vomiting, diarrhea, change in bowel habits, loss of appetite, melena, blood in stool, hematemesis Resp:  no shortness of breath at rest. No dyspnea on exertion, No excess mucus, no productive cough, No non-productive cough, No coughing up of blood.No change in color of mucus.No wheezing. Skin:  no rash or lesions. No jaundice GU:  no dysuria, change in color of urine, no urgency or frequency. No straining to urinate.  No flank pain.  Musculoskeletal:  No joint pain or no joint swelling. No decreased range of motion. No back pain.  Psych:  No change in mood or affect. No depression or anxiety. No memory loss.  Neuro: no localizing neurological complaints, no , no weakness, no double vision, no gait abnormality, no slurred speech, no confusion  As per HPI otherwise 10 point review of systems negative.   Past Medical  History: Past Medical History:  Diagnosis Date  . Anemia   . Blood transfusion without reported diagnosis   . Seizures (HCC)    one time incident, may have been r/t to a pain medication she was prescribed  . Sickle cell disease (HCC)   . Stroke [facility](HCC)    Past Surgical History:  Procedure Laterality Date  . CESAREAN SECTION       x4  . CHOLECYSTECTOMY    . PORTACATH PLACEMENT Right w-3  . reverse tubal ligation       Social History:  Ambulatory   Independently     reports that she has never smoked. She has never used smokeless tobacco. She reports that she does not drink alcohol or use drugs.  Allergies:   Allergies  Allergen Reactions  . Ibuprofen Hives  . Zofran [Ondansetron Hcl] Hives and Itching  . Demerol [Meperidine] Other (See Comments)    Pt has seizures  . Fentanyl And Related Nausea And Vomiting and Other (See Comments)    Very sedated  **Not allergic to the injection** JUST ALLERGIC TO PATCH  . Other Itching    Greek Yogurt  . Codeine Hives and Itching  . Hydrocodone Hives and Itching       Family History:   Family History  Problem Relation Age of Onset  . HIV/AIDS Father     Medications: Prior to Admission medications   Medication Sig Start Date End Date Taking? Authorizing Provider  Ascorbic Acid (VITAMIN C) 1000 MG tablet Take 1,000 mg by mouth every evening.   Yes Historical Provider, MD  aspirin EC 81 MG tablet Take 1 tablet by mouth every evening.  11/03/13  Yes Historical Provider, MD  folic acid (FOLVITE) 1 MG tablet Take 1 mg by mouth daily.  02/13/15  Yes Historical Provider, MD  lamoTRIgine (LAMICTAL) 200 MG tablet Take 1 tablet (200 mg total) by mouth 2 (two) times daily. 01/02/16  Yes Van ClinesKaren M Aquino, MD  levETIRAcetam (KEPPRA) 1000 MG tablet Take 1 tablet (1,000 mg total) by mouth 2 (two) times daily. 01/02/16  Yes Van ClinesKaren M Aquino, MD  promethazine (PHENERGAN) 25 MG tablet 1 tablet every 8 hours prn Patient taking differently: Take 25 mg by mouth every 8 (eight) hours as needed for nausea or vomiting. 1 tablet every 8 hours prn 07/12/16  Yes Henrietta HooverLinda C Bernhardt, NP  diclofenac sodium (VOLTAREN) 1 % GEL Apply 2 g topically 4 (four) times daily. Patient not taking: Reported on 09/06/2016 07/30/16   Quentin Angstlugbemiga E Jegede, MD  zolpidem (AMBIEN) 5 MG tablet Take 1 tablet (5 mg  total) by mouth at bedtime as needed. sleep Patient not taking: Reported on 09/06/2016 03/13/15   Altha HarmMichelle A Matthews, MD    Physical Exam: No data found.   1. General:  in No Acute distress 2. Psychological: Alert and   Oriented 3. Head/ENT:     Dry Mucous Membranes                          Head Non traumatic, neck supple                          Normal   Dentition 4. SKIN: decreased Skin turgor,  Skin clean Dry and intact no rash 5. Heart: Regular rate and rhythm loud Murmur, Rub or gallop 6. Lungs:  Clear to auscultation bilaterally, no wheezes or crackles   7. Abdomen: Soft,  non-tender, Non distended 8. Lower extremities: no clubbing, cyanosis, or edema 9. Neurologically Chronic left-sided weakness and facial droop, left foot drop, mild nystagmus 10. MSK: Normal range of motion   body mass index is unknown because there is no height or weight on file.  Labs on Admission:   Labs on Admission: I have personally reviewed following labs and imaging studies  CBC:  Recent Labs Lab 09/06/16 2235  WBC 17.2*  NEUTROABS 12.4*  HGB 6.3*  HCT 17.2*  MCV 102.4*  PLT 387   Basic Metabolic Panel:  Recent Labs Lab 09/06/16 2235  NA 137  K 4.5  CL 105  CO2 25  GLUCOSE 105*  BUN 9  CREATININE 0.60  CALCIUM 9.5   GFR: CrCl cannot be calculated (Unknown ideal weight.). Liver Function Tests:  Recent Labs Lab 09/06/16 2235  AST 69*  ALT 44  ALKPHOS 72  BILITOT 3.0*  PROT 8.1  ALBUMIN 4.5   No results for input(s): LIPASE, AMYLASE in the last 168 hours. No results for input(s): AMMONIA in the last 168 hours. Coagulation Profile: No results for input(s): INR, PROTIME in the last 168 hours. Cardiac Enzymes: No results for input(s): CKTOTAL, CKMB, CKMBINDEX, TROPONINI in the last 168 hours. BNP (last 3 results) No results for input(s): PROBNP in the last 8760 hours. HbA1C: No results for input(s): HGBA1C in the last 72 hours. CBG: No results for input(s):  GLUCAP in the last 168 hours. Lipid Profile: No results for input(s): CHOL, HDL, LDLCALC, TRIG, CHOLHDL, LDLDIRECT in the last 72 hours. Thyroid Function Tests: No results for input(s): TSH, T4TOTAL, FREET4, T3FREE, THYROIDAB in the last 72 hours. Anemia Panel:  Recent Labs  09/06/16 2235  RETICCTPCT >23.0*    Sepsis Labs: @LABRCNTIP (procalcitonin:4,lacticidven:4) )No results found for this or any previous visit (from the past 240 hour(s)).     UA   ordered  No results found for: HGBA1C  CrCl cannot be calculated (Unknown ideal weight.).  BNP (last 3 results) No results for input(s): PROBNP in the last 8760 hours.   ECG REPORT  Independently reviewed Rate: 98  Rhythm: Sinus rhythm ST&T Change: No acute ischemic changes   QTC 440  There were no vitals filed for this visit.   Cultures:    Component Value Date/Time   SDES BLOOD RIGHT HAND 08/08/2015 2255   SPECREQUEST 5CC 08/08/2015 2255   CULT  08/08/2015 2255    NO GROWTH 5 DAYS Performed at [facility]    REPTSTATUS 08/13/2015 FINAL 08/08/2015 2255     Radiological Exams on Admission: Dg Chest 2 View  Result Date: 09/06/2016 CLINICAL DATA:  Shortness of breath and dizziness. Sickle cell disease. EXAM: CHEST  2 VIEW COMPARISON:  Chest radiograph 06/21/2016 FINDINGS: Dual-lumen right chest wall Port-A-Cath is in unchanged position with tip at the cavoatrial junction. Cardiomediastinal contours are normal. No pleural effusion or pneumothorax. No focal airspace consolidation or pulmonary edema. IMPRESSION: No active cardiopulmonary disease. Electronically Signed   By: Deatra Robinson M.D.   On: 09/06/2016 22:20    Chart has been reviewed    Assessment/Plan  35 y.o. female with medical history significant of sickle cell disease, stroke at the age of 89 resulting in left-sided weakness, seizure disorder Being admitted for evaluation of vertigo/lightheadedness and dyspnea, was found to have symptomatic  anemia and undergoing blood transfusion Present on Admission: Sickle cell disease patient states current pain is moderate will attempt to control with by mouth medications for now patient having no  nausea vomiting . Symptomatic anemia- may be contributing to his dyspnea we'll transfuse 1 unit . Dyspnea- multiple etiologies patient is anemic which likely contributing to dyspnea also to high-risk for PE given history of sickle cell disease. Given worsening dyspnea and lightheadedness we'll attempt to obtain CT angio of the chest currently working on IV access Vertigo and left side weakness- vertigo has been coming and going for the past one month currently resolved given high risk will need to rule out central vertigo. she reports vague change in sensation in left side. CT scan of the head non-acute. Discussed with neurology recommends MRI in a.m. [facility] given sickle cell disease and vague symptoms. If MRI positive for new CVA will need neurology consult Seizure disorder currently stable continue home medications Other plan as per orders.  DVT prophylaxis:  SCD       Code Status:  FULL CODE   as per patient    Family Communication:   Family  at  Bedside     Disposition Plan:    To home once workup is complete and patient is stable                        Would benefit from PT/OT eval prior to DC  ordered                                               Consults called: none   Admission status:  obs   Level of care tele         I have spent a total of 56 min on this admission   Debra Williamson 09/06/2016, 11:50 PM    [facility]  Pager [phone]   after 2 AM please page floor coverage PA If 7AM-7PM, please contact the day team taking care of the patient  [email or web address]  Password [email or web address]

## 2016-09-06 NOTE — ED Triage Notes (Signed)
Pt presents with c/o of dizziness, reports room spinning sensation with an onset of 2 days ago.

## 2016-09-06 NOTE — ED Notes (Signed)
EDP Mackuen made aware of pt Hgb

## 2016-09-07 ENCOUNTER — Encounter (HOSPITAL_COMMUNITY): Payer: Self-pay | Admitting: *Deleted

## 2016-09-07 ENCOUNTER — Emergency Department (HOSPITAL_COMMUNITY): Payer: Medicaid Other

## 2016-09-07 ENCOUNTER — Other Ambulatory Visit (HOSPITAL_COMMUNITY): Payer: Self-pay | Admitting: Radiology

## 2016-09-07 ENCOUNTER — Observation Stay (HOSPITAL_COMMUNITY): Payer: Medicaid Other

## 2016-09-07 ENCOUNTER — Observation Stay (HOSPITAL_BASED_OUTPATIENT_CLINIC_OR_DEPARTMENT_OTHER): Payer: Medicaid Other

## 2016-09-07 DIAGNOSIS — R42 Dizziness and giddiness: Secondary | ICD-10-CM

## 2016-09-07 DIAGNOSIS — D72829 Elevated white blood cell count, unspecified: Secondary | ICD-10-CM | POA: Diagnosis present

## 2016-09-07 DIAGNOSIS — R040 Epistaxis: Secondary | ICD-10-CM | POA: Diagnosis present

## 2016-09-07 DIAGNOSIS — D5700 Hb-SS disease with crisis, unspecified: Secondary | ICD-10-CM

## 2016-09-07 DIAGNOSIS — I6789 Other cerebrovascular disease: Secondary | ICD-10-CM

## 2016-09-07 DIAGNOSIS — R0600 Dyspnea, unspecified: Secondary | ICD-10-CM | POA: Diagnosis present

## 2016-09-07 DIAGNOSIS — Z79899 Other long term (current) drug therapy: Secondary | ICD-10-CM | POA: Diagnosis not present

## 2016-09-07 DIAGNOSIS — D62 Acute posthemorrhagic anemia: Secondary | ICD-10-CM | POA: Diagnosis present

## 2016-09-07 DIAGNOSIS — I69354 Hemiplegia and hemiparesis following cerebral infarction affecting left non-dominant side: Secondary | ICD-10-CM | POA: Diagnosis not present

## 2016-09-07 DIAGNOSIS — R000 Tachycardia, unspecified: Secondary | ICD-10-CM

## 2016-09-07 DIAGNOSIS — R0602 Shortness of breath: Secondary | ICD-10-CM

## 2016-09-07 DIAGNOSIS — D638 Anemia in other chronic diseases classified elsewhere: Secondary | ICD-10-CM

## 2016-09-07 DIAGNOSIS — M21372 Foot drop, left foot: Secondary | ICD-10-CM | POA: Diagnosis present

## 2016-09-07 DIAGNOSIS — I69398 Other sequelae of cerebral infarction: Secondary | ICD-10-CM | POA: Diagnosis not present

## 2016-09-07 DIAGNOSIS — G40909 Epilepsy, unspecified, not intractable, without status epilepticus: Secondary | ICD-10-CM | POA: Diagnosis present

## 2016-09-07 DIAGNOSIS — Z7982 Long term (current) use of aspirin: Secondary | ICD-10-CM | POA: Diagnosis not present

## 2016-09-07 DIAGNOSIS — R0902 Hypoxemia: Secondary | ICD-10-CM | POA: Diagnosis not present

## 2016-09-07 LAB — CBC
HCT: 28.2 % — ABNORMAL LOW (ref 36.0–46.0)
Hemoglobin: 9.5 g/dL — ABNORMAL LOW (ref 12.0–15.0)
MCH: 34.5 pg — ABNORMAL HIGH (ref 26.0–34.0)
MCHC: 33.7 g/dL (ref 30.0–36.0)
MCV: 102.5 fL — ABNORMAL HIGH (ref 78.0–100.0)
Platelets: 88 10*3/uL — ABNORMAL LOW (ref 150–400)
RBC: 2.75 MIL/uL — ABNORMAL LOW (ref 3.87–5.11)
RDW: 14.6 % (ref 11.5–15.5)
WBC: 4.8 10*3/uL (ref 4.0–10.5)

## 2016-09-07 LAB — LIPID PANEL
Cholesterol: 128 mg/dL (ref 0–200)
HDL: 26 mg/dL — ABNORMAL LOW (ref >40)
LDL Cholesterol: 60 mg/dL (ref 0–99)
Total CHOL/HDL Ratio: 4.9 RATIO
Triglycerides: 211 mg/dL — ABNORMAL HIGH (ref <150)
VLDL: 42 mg/dL — ABNORMAL HIGH (ref 0–40)

## 2016-09-07 LAB — URINALYSIS, ROUTINE W REFLEX MICROSCOPIC
Bilirubin Urine: NEGATIVE
Glucose, UA: NEGATIVE mg/dL
Hgb urine dipstick: NEGATIVE
Ketones, ur: NEGATIVE mg/dL
Leukocytes, UA: NEGATIVE
Nitrite: NEGATIVE
Protein, ur: NEGATIVE mg/dL
Specific Gravity, Urine: 1.023 (ref 1.005–1.030)
pH: 7 (ref 5.0–8.0)

## 2016-09-07 LAB — ECHOCARDIOGRAM COMPLETE
Height: 64 in
Weight: 2326.4 oz

## 2016-09-07 LAB — PREPARE RBC (CROSSMATCH)

## 2016-09-07 LAB — TROPONIN I: Troponin I: <0.03 ng/mL (ref <0.03)

## 2016-09-07 LAB — PHOSPHORUS: Phosphorus: 3.9 mg/dL (ref 2.5–4.6)

## 2016-09-07 LAB — MAGNESIUM: Magnesium: 1.9 mg/dL (ref 1.7–2.4)

## 2016-09-07 MED ORDER — BISACODYL 5 MG PO TBEC: 5.0000 mg | DELAYED_RELEASE_TABLET | Freq: Every day | ORAL | Status: DC | PRN

## 2016-09-07 MED ORDER — FOLIC ACID 1 MG PO TABS
1.0000 mg | ORAL_TABLET | Freq: Every day | ORAL | Status: DC
  Administered 2016-09-07 – 2016-09-09 (×3): 1 mg via ORAL
  Filled 2016-09-07 (×4): qty 1

## 2016-09-07 MED ORDER — HYDROMORPHONE HCL 1 MG/ML IJ SOLN
1.0000 mg | Freq: Once | INTRAMUSCULAR | Status: AC
  Administered 2016-09-07: 1 mg via INTRAVENOUS
  Filled 2016-09-07: qty 1

## 2016-09-07 MED ORDER — DIPHENHYDRAMINE HCL 25 MG PO CAPS: 25.0000 mg | ORAL_CAPSULE | Freq: Four times a day (QID) | ORAL | Status: DC | PRN

## 2016-09-07 MED ORDER — OXYCODONE HCL 5 MG PO TABS
10.0000 mg | ORAL_TABLET | ORAL | Status: DC
  Administered 2016-09-07 – 2016-09-10 (×15): 10 mg via ORAL
  Filled 2016-09-07 (×17): qty 2

## 2016-09-07 MED ORDER — DIPHENHYDRAMINE HCL 25 MG PO CAPS
25.0000 mg | ORAL_CAPSULE | Freq: Four times a day (QID) | ORAL | Status: DC | PRN
  Administered 2016-09-08: 50 mg via ORAL
  Administered 2016-09-08 – 2016-09-09 (×3): 25 mg via ORAL
  Filled 2016-09-07: qty 2
  Filled 2016-09-07: qty 1
  Filled 2016-09-07: qty 2
  Filled 2016-09-07 (×2): qty 1

## 2016-09-07 MED ORDER — LAMOTRIGINE 100 MG PO TABS
200.0000 mg | ORAL_TABLET | Freq: Two times a day (BID) | ORAL | Status: DC
Start: 2016-09-07 — End: 2016-09-10
  Administered 2016-09-07 – 2016-09-09 (×7): 200 mg via ORAL
  Filled 2016-09-07 (×7): qty 2

## 2016-09-07 MED ORDER — HYDROMORPHONE HCL 2 MG/ML IJ SOLN
2.0000 mg | INTRAMUSCULAR | Status: DC | PRN
  Administered 2016-09-07 – 2016-09-10 (×12): 2 mg via INTRAVENOUS
  Filled 2016-09-07 (×14): qty 1

## 2016-09-07 MED ORDER — ACETAMINOPHEN 325 MG PO TABS: 650.0000 mg | ORAL_TABLET | ORAL | Status: DC | PRN

## 2016-09-07 MED ORDER — HYDROXYZINE HCL 25 MG PO TABS
25.0000 mg | ORAL_TABLET | ORAL | Status: DC | PRN
  Administered 2016-09-07: 50 mg via ORAL
  Filled 2016-09-07: qty 2

## 2016-09-07 MED ORDER — SODIUM CHLORIDE 0.9% FLUSH: 10.0000 mL | Freq: Two times a day (BID) | INTRAVENOUS | Status: DC

## 2016-09-07 MED ORDER — ORAL CARE MOUTH RINSE
15.0000 mL | Freq: Two times a day (BID) | OROMUCOSAL | Status: DC
  Administered 2016-09-09 (×2): 15 mL via OROMUCOSAL

## 2016-09-07 MED ORDER — DIPHENHYDRAMINE HCL 50 MG/ML IJ SOLN
50.0000 mg | INTRAMUSCULAR | Status: DC | PRN
  Administered 2016-09-07 – 2016-09-08 (×3): 50 mg via INTRAVENOUS
  Filled 2016-09-07 (×3): qty 1

## 2016-09-07 MED ORDER — OXYCODONE HCL 5 MG PO TABS
10.0000 mg | ORAL_TABLET | ORAL | Status: DC | PRN
  Administered 2016-09-07: 10 mg via ORAL
  Filled 2016-09-07 (×2): qty 2

## 2016-09-07 MED ORDER — DEXTROSE-NACL 5-0.45 % IV SOLN
INTRAVENOUS | Status: AC
  Administered 2016-09-07: 16:00:00 via INTRAVENOUS

## 2016-09-07 MED ORDER — POLYETHYLENE GLYCOL 3350 17 G PO PACK
17.0000 g | PACK | Freq: Every day | ORAL | Status: DC | PRN
  Administered 2016-09-08 – 2016-09-09 (×2): 17 g via ORAL
  Filled 2016-09-07 (×2): qty 1

## 2016-09-07 MED ORDER — LEVETIRACETAM 500 MG PO TABS
1000.0000 mg | ORAL_TABLET | Freq: Two times a day (BID) | ORAL | Status: DC
Start: 2016-09-07 — End: 2016-09-10
  Administered 2016-09-07 – 2016-09-09 (×6): 1000 mg via ORAL
  Filled 2016-09-07 (×7): qty 2

## 2016-09-07 MED ORDER — SODIUM CHLORIDE 0.9 % IV SOLN
Freq: Once | INTRAVENOUS | Status: AC
  Administered 2016-09-07: 03:00:00 via INTRAVENOUS

## 2016-09-07 MED ORDER — ACETAMINOPHEN 325 MG PO TABS
650.0000 mg | ORAL_TABLET | Freq: Once | ORAL | Status: AC
  Administered 2016-09-07: 650 mg via ORAL
  Filled 2016-09-07: qty 2

## 2016-09-07 MED ORDER — ASPIRIN EC 81 MG PO TBEC
81.0000 mg | DELAYED_RELEASE_TABLET | Freq: Every evening | ORAL | Status: DC
  Administered 2016-09-07 – 2016-09-10 (×3): 81 mg via ORAL
  Filled 2016-09-07 (×4): qty 1

## 2016-09-07 MED ORDER — DIPHENHYDRAMINE HCL 50 MG/ML IJ SOLN
12.5000 mg | Freq: Once | INTRAMUSCULAR | Status: AC
  Administered 2016-09-07: 12.5 mg via INTRAVENOUS
  Filled 2016-09-07: qty 1

## 2016-09-07 MED ORDER — DIPHENHYDRAMINE HCL 25 MG PO CAPS
25.0000 mg | ORAL_CAPSULE | Freq: Four times a day (QID) | ORAL | Status: DC | PRN
  Administered 2016-09-07 (×2): 25 mg via ORAL
  Filled 2016-09-07 (×3): qty 1

## 2016-09-07 MED ORDER — PROMETHAZINE HCL 25 MG PO TABS
25.0000 mg | ORAL_TABLET | Freq: Four times a day (QID) | ORAL | Status: DC | PRN
  Administered 2016-09-07 – 2016-09-09 (×3): 25 mg via ORAL
  Filled 2016-09-07 (×3): qty 1

## 2016-09-07 MED ORDER — OXYCODONE HCL ER 15 MG PO T12A
30.0000 mg | EXTENDED_RELEASE_TABLET | Freq: Two times a day (BID) | ORAL | Status: DC
  Administered 2016-09-07 – 2016-09-09 (×6): 30 mg via ORAL
  Filled 2016-09-07 (×7): qty 2

## 2016-09-07 MED ORDER — SODIUM CHLORIDE 0.9 % IV SOLN
25.0000 mg | INTRAVENOUS | Status: DC | PRN
  Administered 2016-09-07: 25 mg via INTRAVENOUS
  Filled 2016-09-07: qty 0.5

## 2016-09-07 MED ORDER — SENNOSIDES-DOCUSATE SODIUM 8.6-50 MG PO TABS
1.0000 | ORAL_TABLET | Freq: Two times a day (BID) | ORAL | Status: DC
  Administered 2016-09-07 – 2016-09-09 (×6): 1 via ORAL
  Filled 2016-09-07 (×7): qty 1

## 2016-09-07 MED ORDER — MAGNESIUM CITRATE PO SOLN: 1.0000 | Freq: Once | ORAL | Status: DC | PRN

## 2016-09-07 MED ORDER — SODIUM CHLORIDE 0.9% FLUSH
10.0000 mL | INTRAVENOUS | Status: DC | PRN
  Administered 2016-09-10: 10 mL
  Filled 2016-09-07: qty 40

## 2016-09-07 MED ORDER — ACETAMINOPHEN 650 MG RE SUPP: 650.0000 mg | RECTAL | Status: DC | PRN

## 2016-09-07 NOTE — Progress Notes (Signed)
PT Note  Patient Details Name: Debra Williamson MRN: [id number] DOB: [birth year]   :     Chart reviewed and spoke with patient. Pt had returned from MRI this morning and trying to eat breakfast. Reports that she is ambulating with nursing to bathroom and has some dizziness while performing this. Pt is independent with all mobility PTA. Pt asking about MRI results and if receiving more blood later. Will see what MD reports after he sees patient this morning. Will return this afternoon to assess pt more.     Marella BileBRITT, Debra Williamson 09/07/2016, 9:32 AM  Marella BileSharron [name], PT Pager: [phone] 09/07/2016

## 2016-09-07 NOTE — ED Provider Notes (Signed)
Emergency Ultrasound Study:   Angiocath insertion Performed by: Dollene Clevelandameron Isascs Consent: Verbal consent/emergent consent obtained. Risks and benefits: risks, benefits and alternatives were discussed Immediately prior to procedure the correct patient, procedure, equipment, support staff and site/side marked as needed.  Indication: difficult IV access Preparation: Patient was prepped and draped in the usual sterile fashion. Sterile gel was used for this procedure and the ultrasound probe was sterilized prior to use. Vein Location: Right forearm vein was visualized during assessment for potential access sites and was found to be patent/ easily compressed with linear ultrasound.  The needle was visualized with real-time ultrasound and guided into the vein. Gauge: 20  Image saved and stored.  Normal blood return.   Patient tolerance: Patient tolerated the procedure well with no immediate complications.        Shaune Pollackameron Masa Lubin, MD 09/07/16 [phone]

## 2016-09-07 NOTE — Progress Notes (Signed)
SICKLE CELL SERVICE PROGRESS NOTE  Debra Williamson ZOX:[id number] DOB: 03/19/1981 DOA: 09/06/2016 PCP: Concepcion Living, NP  Assessment/Plan: Active Problems:   Symptomatic anemia   H/O: stroke   Seizure disorder (HCC)   History of stroke associated with blood clotting tendency   Dizziness, nonspecific   Left-sided weakness   Lightheadedness   Vertigo   Dyspnea  1. Vertigo: Pt reports that vertigo has significantly improved since receiving transfusion. MRI results noted . ECHO not yet performed.  2. Hb SS with crisis:: Pt has had no medications at home for pain as she has no insurance coverage at present. She normally takes Oxycontin and Oxycodone PRN but states that she ran out of medication about 2 weeks ago. NCCSRS shows that last prescription filled was in August of this year. She received a prescription for both from Dr.Jegede but was unable to fill them. Will resume Oxycontin and Oxycodone and schedule Dilaudid 2 mg every 3 hours as needed.  3. Anemia of Chronic Disease: Pt is known to have high hemolysis.Her Hb was at 6.3 g/dL on admission and patient was symptomatic thus she received a transfusion of 1 unit RBC's. At present her Hb is 9.5 g/dL post-transfusion.  4. Tachycardia: This has improved since transfusion. Will continue telemetry monitoring at least for 24 hours until vertigo has resolved. Re-assess tomorrow.  5. H/O CVA: She also has a h/o CVA and was being seen by Dr.Redding-Lallinger at [facility] who had planned chronic exchange transfusions for secondary stroke prophylaxis. However when patient started going to the [facility] she thought she was seeing a Hematologist thus she substituted their advice for that of her Hematologist. I have advised patient that she should continue to follow with her Hematologist and pursue the chronic exchanges as she does not want to take Hydrea in consideration of another pregnancy. 6. Leukocytosis: Resolved after transfusion. Likely represented increased  bone marrow activity in response to hemolysis.  7. Chronic pain: Pt has been taking OxyContin for many years and I will continue OxyContin 30 mg BID.    Code Status: Full Code Family Communication: N/A Disposition Plan: Not yet ready for discharge  MATTHEWS,MICHELLE A.  Pager [phone]. If 7PM-7AM, please contact night-coverage.  09/07/2016, 11:29 AM  LOS: 0 days   Interim History: Pt reports that her dizziness is much improved. She has no nausea at present. She states that she is having pain in her joints at an intensity of 7/10. Her baseline pain is 3-5/10 with OxyContin and she can manage levels of 5-6/10 with PRN oxycodone.   Consultants:  None  Procedures:  None  Antibiotics:  None   Objective: Vitals:   09/07/16 0244 09/07/16 0435 09/07/16 0455 09/07/16 0719  BP: 117/62 (!) 98/54 (!) 106/54 (!) 103/50  Pulse: 98 83 87 85  Resp: 20 20 20 20   Temp: 98.8 F (37.1 C) 98.6 F (37 C) 98.4 F (36.9 C) 98.2 F (36.8 C)  TempSrc: Oral Oral Oral Oral  SpO2: 100% 100% 100% 100%  Weight: 66 kg (145 lb 6.4 oz)     Height: 5\' 4"  (1.626 m)      Weight change:   Intake/Output Summary (Last 24 hours) at 09/07/16 1129 Last data filed at 09/07/16 1109  Gross per 24 hour  Intake              682 ml  Output              800 ml  Net             -  118 ml    General: Alert, awake, oriented x3, in no acute distress.  HEENT: [place]/AT PEERL, EOMI Neck: Trachea midline,  no masses, no thyromegal,y no JVD, no carotid bruit OROPHARYNX:  Moist, No exudate/ erythema/lesions.  Heart: Regular rate and rhythm, without murmurs, rubs, gallops, PMI non-displaced, no heaves or thrills on palpation.  Lungs: Clear to auscultation, no wheezing or rhonchi noted. No increased vocal fremitus resonant to percussion  Abdomen: Soft, nontender, nondistended, positive bowel sounds, no masses no hepatosplenomegaly noted..  Neuro: No focal neurological deficits noted cranial nerves II through XII grossly  intact. DTRs 2+ bilaterally upper and lower extremities. Strength 5 out of 5 in bilateral upper and lower extremities. Musculoskeletal: No warm swelling or erythema around joints, no spinal tenderness noted. Psychiatric: Patient alert and oriented x3, good insight and cognition, good recent to remote recall. Lymph node survey: No cervical axillary or inguinal lymphadenopathy noted.   Data Reviewed: Basic Metabolic Panel:  Recent Labs Lab 09/06/16 2235 09/07/16 0337  NA 137  --   K 4.5  --   CL 105  --   CO2 25  --   GLUCOSE 105*  --   BUN 9  --   CREATININE 0.60  --   CALCIUM 9.5  --   MG  --  1.9  PHOS  --  3.9   Liver Function Tests:  Recent Labs Lab 09/06/16 2235  AST 69*  ALT 44  ALKPHOS 72  BILITOT 3.0*  PROT 8.1  ALBUMIN 4.5   No results for input(s): LIPASE, AMYLASE in the last 168 hours. No results for input(s): AMMONIA in the last 168 hours. CBC:  Recent Labs Lab 09/06/16 2235 09/07/16 0946  WBC 17.2* 4.8  NEUTROABS 12.4*  --   HGB 6.3* 9.5*  HCT 17.2* 28.2*  MCV 102.4* 102.5*  PLT 387 88*   Cardiac Enzymes:  Recent Labs Lab 09/07/16 0337  TROPONINI <0.03   BNP (last 3 results) No results for input(s): BNP in the last 8760 hours.  ProBNP (last 3 results) No results for input(s): PROBNP in the last 8760 hours.  CBG: No results for input(s): GLUCAP in the last 168 hours.  No results found for this or any previous visit (from the past 240 hour(s)).   Studies: Dg Chest 2 View  Result Date: 09/06/2016 CLINICAL DATA:  Shortness of breath and dizziness. Sickle cell disease. EXAM: CHEST  2 VIEW COMPARISON:  Chest radiograph 06/21/2016 FINDINGS: Dual-lumen right chest wall Port-A-Cath is in unchanged position with tip at the cavoatrial junction. Cardiomediastinal contours are normal. No pleural effusion or pneumothorax. No focal airspace consolidation or pulmonary edema. IMPRESSION: No active cardiopulmonary disease. Electronically Signed   By:  Deatra RobinsonKevin  Herman M.D.   On: 09/06/2016 22:20   Ct Head Wo Contrast  Result Date: 09/07/2016 CLINICAL DATA:  Chronic intermittent dizziness. History of CVA. Initial encounter. EXAM: CT HEAD WITHOUT CONTRAST TECHNIQUE: Contiguous axial images were obtained from the base of the skull through the vertex without intravenous contrast. COMPARISON:  None. FINDINGS: Brain: No evidence of acute infarction, hemorrhage, hydrocephalus, extra-axial collection or mass lesion/mass effect. There is a chronic infarct involving much of the right cerebral hemisphere, with diffuse encephalomalacia. There is mild involvement of the right basal ganglia. Mild rightward midline shift likely reflects the degree of right-sided volume loss. The brainstem and fourth ventricle are within normal limits. Vascular: No hyperdense vessel or unexpected calcification. Skull: There is no evidence of fracture; visualized osseous structures are unremarkable in  appearance. Sinuses/Orbits: The orbits are within normal limits. The paranasal sinuses and mastoid air cells are well-aerated. Other: No significant soft tissue abnormalities are seen. IMPRESSION: 1. No acute intracranial pathology seen on CT. 2. Chronic infarct involving much of the right cerebral hemisphere, with diffuse encephalomalacia. Mild rightward midline shift likely reflects some degree of right-sided volume loss. Electronically Signed   By: Roanna Raider M.D.   On: 09/07/2016 00:52   Ct Angio Chest Pe W And/or Wo Contrast  Result Date: 09/07/2016 CLINICAL DATA:  Acute onset of shortness of breath and dizziness. Initial encounter. EXAM: CT ANGIOGRAPHY CHEST WITH CONTRAST TECHNIQUE: Multidetector CT imaging of the chest was performed using the standard protocol during bolus administration of intravenous contrast. Multiplanar CT image reconstructions and MIPs were obtained to evaluate the vascular anatomy. CONTRAST:  100 mL of Isovue 370 IV contrast COMPARISON:  Chest radiograph  performed 09/06/2016, and CTA of the chest performed 08/08/2015 FINDINGS: Cardiovascular:  There is no evidence of pulmonary embolus. The heart is unremarkable in appearance. The thoracic aorta is within normal limits. The great vessels are unremarkable. No calcific atherosclerotic disease is seen. Mediastinum/Nodes: The mediastinum is unremarkable appearance. No mediastinal lymphadenopathy is seen. No pericardial effusion is identified. The visualized portions of the thyroid gland are unremarkable. No axillary lymphadenopathy is seen. Lungs/Pleura: Minimal bibasilar atelectasis is noted. No pleural effusion or pneumothorax is seen. No masses are identified. Upper Abdomen: The visualized portions of the liver are unremarkable. The spleen is diffusely calcified and diminutive in appearance. The visualized portions of the pancreas, adrenal glands and kidneys are within normal limits. Musculoskeletal: No acute osseous abnormalities are identified. The visualized musculature is unremarkable in appearance. Review of the MIP images confirms the above findings. IMPRESSION: 1. No evidence of pulmonary embolus. 2. Minimal bibasilar atelectasis noted.  Lungs otherwise clear. 3. Diffusely calcified diminutive spleen noted. Electronically Signed   By: Roanna Raider M.D.   On: 09/07/2016 02:01   Mr Brain Wo Contrast  Result Date: 09/07/2016 CLINICAL DATA:  TIA.  Vertigo. EXAM: MRI HEAD WITHOUT CONTRAST MRA HEAD WITHOUT CONTRAST TECHNIQUE: Multiplanar, multiecho pulse sequences of the brain and surrounding structures were obtained without intravenous contrast. Angiographic images of the head were obtained using MRA technique without contrast. COMPARISON:  Head CT from earlier today FINDINGS: MRI HEAD FINDINGS Brain: Extensive post ischemic gliosis and encephalomalacia in the right cerebral hemisphere which occurred remotely as reflected in the remodeled calvarium. Ischemic changes are most dense in the MCA territory, but  also affect the distal ACA territory. The right cerebral hemisphere is asymmetrically atrophic in the right PCA distribution with watershed area infarcts, attributed to chronic ischemia. The left hemisphere appears normal. Wallerian degeneration seen in the deep white matter tracts and brainstem. No acute infarct, hemorrhage, hydrocephalus, or mass. Vascular: Described below. Skull and upper cervical spine: Hypointense marrow in the setting of anemia. Expanded calvarium in this patient with seizure disorder. Sinuses/Orbits: No acute finding. MRA HEAD FINDINGS Diminutive but patent right ICA seen to the level of the ophthalmic segment beyond which there is flow gap and reconstitution in the setting of lenticulostriate collaterals and a sizable posterior communicating artery. There are numerous lenticulostriate collaterals with a moyamoya type appearance, both anterior and right lateral. Faint flow seen within bilateral ACA and right MCA branches. Proximal right PCA occlusion with reconstitution via multiple collaterals. Robust flow in the left MCA and PCA distributions. Hyper trophic appearance of middle meningeal arteries. IMPRESSION: 1. No acute finding. 2. Extensive chronic  right cerebral infarct as described above. 3. Minimal flow in the right ICA that is occluded at the cavernous segment and reconstitution by posterior communicating artery. Poor flow in the bilateral ACA and right MCA branches in the setting of bilateral A1 and right M1 occlusion. Well established lenticulostriate collaterals. 4. Proximal right PCA occlusion with reconstitution by regional collaterals. 5. Good flow in the left MCA and left PCA distributions with large left posterior communicating artery. Electronically Signed   By: Marnee Spring M.D.   On: 09/07/2016 08:54   Mr Maxine Glenn Head/brain ZO Cm  Result Date: 09/07/2016 CLINICAL DATA:  TIA.  Vertigo. EXAM: MRI HEAD WITHOUT CONTRAST MRA HEAD WITHOUT CONTRAST TECHNIQUE: Multiplanar,  multiecho pulse sequences of the brain and surrounding structures were obtained without intravenous contrast. Angiographic images of the head were obtained using MRA technique without contrast. COMPARISON:  Head CT from earlier today FINDINGS: MRI HEAD FINDINGS Brain: Extensive post ischemic gliosis and encephalomalacia in the right cerebral hemisphere which occurred remotely as reflected in the remodeled calvarium. Ischemic changes are most dense in the MCA territory, but also affect the distal ACA territory. The right cerebral hemisphere is asymmetrically atrophic in the right PCA distribution with watershed area infarcts, attributed to chronic ischemia. The left hemisphere appears normal. Wallerian degeneration seen in the deep white matter tracts and brainstem. No acute infarct, hemorrhage, hydrocephalus, or mass. Vascular: Described below. Skull and upper cervical spine: Hypointense marrow in the setting of anemia. Expanded calvarium in this patient with seizure disorder. Sinuses/Orbits: No acute finding. MRA HEAD FINDINGS Diminutive but patent right ICA seen to the level of the ophthalmic segment beyond which there is flow gap and reconstitution in the setting of lenticulostriate collaterals and a sizable posterior communicating artery. There are numerous lenticulostriate collaterals with a moyamoya type appearance, both anterior and right lateral. Faint flow seen within bilateral ACA and right MCA branches. Proximal right PCA occlusion with reconstitution via multiple collaterals. Robust flow in the left MCA and PCA distributions. Hyper trophic appearance of middle meningeal arteries. IMPRESSION: 1. No acute finding. 2. Extensive chronic right cerebral infarct as described above. 3. Minimal flow in the right ICA that is occluded at the cavernous segment and reconstitution by posterior communicating artery. Poor flow in the bilateral ACA and right MCA branches in the setting of bilateral A1 and right M1  occlusion. Well established lenticulostriate collaterals. 4. Proximal right PCA occlusion with reconstitution by regional collaterals. 5. Good flow in the left MCA and left PCA distributions with large left posterior communicating artery. Electronically Signed   By: Marnee Spring M.D.   On: 09/07/2016 08:54    Scheduled Meds: . aspirin EC  81 mg Oral QPM  . folic acid  1 mg Oral Daily  .  HYDROmorphone (DILAUDID) injection  1 mg Intravenous Once  . iopamidol      . lamoTRIgine  200 mg Oral BID  . levETIRAcetam  1,000 mg Oral BID  . mouth rinse  15 mL Mouth Rinse BID  . oxyCODONE  30 mg Oral Q12H  . senna-docusate  1 tablet Oral BID  . sodium chloride      . sodium chloride flush  10-40 mL Intracatheter Q12H   Continuous Infusions: . dextrose 5 % and 0.45% NaCl Stopped (09/07/16 0350)    Active Problems:   Symptomatic anemia   H/O: stroke   Seizure disorder (HCC)   History of stroke associated with blood clotting tendency   Dizziness, nonspecific   Left-sided weakness  Lightheadedness   Vertigo   Dyspnea    In excess of 35 minutes spent during this visit. Greater than 50% involved face to face contact with the patient for assessment, counseling and coordination of care.

## 2016-09-07 NOTE — ED Notes (Signed)
Ct in room now

## 2016-09-07 NOTE — Evaluation (Signed)
Physical Therapy Evaluation Patient Details Name: Debra Williamson MRN: [id number] DOB: [birth year] Today's Date: 09/07/2016   History of Present Illness  pt with reports of SOB and dizziness, with sickle cell. h/o RMCA with L residual hemiparesis. ruled out PE and acute infact.  Pt independent at home except husband drives.   Clinical Impression  Pt with reports of dizziness and difficulty walking. Pt stated that has improved today with no reports of dizziness. Upon assessment of gaze, end range, and vestibular, no onset of dizziness symptoms and no nystagmus present. Pt ambulated in hallway without her AFO on L foot , however still at her baseline and MODI level. No further PT needs at this time, however pt has concerns and questions OT will address when they assess pt.  Thanks     Follow Up Recommendations No PT follow up    Equipment Recommendations  None recommended by PT    Recommendations for Other Services       Precautions / Restrictions Precautions Precautions: None Restrictions Weight Bearing Restrictions: No      Mobility  Bed Mobility Overal bed mobility: Independent                Transfers Overall transfer level: Modified independent                  Ambulation/Gait Ambulation/Gait assistance: Modified independent (Device/Increase time) Ambulation Distance (Feet): 250 Feet Assistive device: None Gait Pattern/deviations: Step-through pattern Gait velocity: normal pace    General Gait Details: pt reports no dizziness with gait, has drop foot and weakness on L side due to CVA many years ago, however functional with gait even without AFO. Pt hoping to have MD appointment to address new brace for L foot soon.    Stairs            Wheelchair Mobility    Modified Rankin (Stroke Patients Only)       Balance                                             Pertinent Vitals/Pain Pain Assessment:  (some pain in my joints  (knees, and hips and shoulders as usual with my sickle cell) )    Home Living Family/patient expects to be discharged to:: Private residence Living Arrangements: Spouse/significant other Available Help at Discharge: Family Type of Home: Apartment Home Access: Level entry (but moving into a new location with steps, however ot reports no issues with steps )     Home Layout: One level Home Equipment: None Additional Comments: used to have a home splint for L hand for prevention of contractures, however does not have one anymore. Had questions for [place] and referred them to the OT eval here acute care.     Prior Function Level of Independence: Independent               Hand Dominance        Extremity/Trunk Assessment   Upper Extremity Assessment:  (today PROM WNL for extension, just great tone back to flexion. )           Lower Extremity Assessment: Overall WFL for tasks assessed (has positive clonus with DF on L, 0/5 DF, and syergistic pattern with knee extension hip flexion  2+/5.  wears hinged AFO on L LE , however ambulated today without . )  Communication   Communication: No difficulties  Cognition Arousal/Alertness: Awake/alert Behavior During Therapy: WFL for tasks assessed/performed Overall Cognitive Status: Within Functional Limits for tasks assessed                      General Comments      Exercises Other Exercises Other Exercises: also assessed pt with gaze at end range, no reprots of dizziness and no nystagmus noted. Pt reproted she has not had any dizziness today, that it seems much better.    Assessment/Plan    PT Assessment Patent does not need any further PT services (all PT needs met, however still recommend OT for patiients questions concerns )  PT Problem List            PT Treatment Interventions      PT Goals (Current goals can be found in the Care Plan section)  Acute Rehab PT Goals Patient Stated Goal: I hope to go  home soon  PT Goal Formulation: All assessment and education complete, DC therapy    Frequency     Barriers to discharge        Co-evaluation               End of Session Equipment Utilized During Treatment: Gait belt Activity Tolerance: Patient tolerated treatment well Patient left: in bed Nurse Communication: Mobility status    Functional Assessment Tool Used: clinical judgment and assessment Functional Limitation: Mobility: Walking and moving around Mobility: Walking and Moving Around Current Status [phone]): 0 percent impaired, limited or restricted Mobility: Walking and Moving Around Goal Status [phone]): 0 percent impaired, limited or restricted Mobility: Walking and Moving Around Discharge Status [id number]): 0 percent impaired, limited or restricted    Time: 1440-1500 PT Time Calculation (min) (ACUTE ONLY): 20 min   Charges:   PT Evaluation $PT Eval Low Complexity: 1 Procedure     PT G Codes:   PT G-Codes **NOT FOR INPATIENT CLASS** Functional Assessment Tool Used: clinical judgment and assessment Functional Limitation: Mobility: Walking and moving around Mobility: Walking and Moving Around Current Status (I2641): 0 percent impaired, limited or restricted Mobility: Walking and Moving Around Goal Status (R8309): 0 percent impaired, limited or restricted Mobility: Walking and Moving Around Discharge Status (M0768): 0 percent impaired, limited or restricted    Jasraj Lappe 09/07/2016, 3:08 PM  Clide Dales, PT Pager: [phone] 09/07/2016

## 2016-09-07 NOTE — Progress Notes (Signed)
  Echocardiogram 2D Echocardiogram has been performed.  Debra Williamson 09/07/2016, 1:15 PM

## 2016-09-07 NOTE — Evaluation (Signed)
Speech Language Pathology Evaluation Patient Details Name: Gloriajean DellShanequa Naji MRN: [id number] DOB: 12-18-1980 Today's Date: 09/07/2016 Time: [id number] SLP Time Calculation (min) (ACUTE ONLY): 10 min  Problem List:  Patient Active Problem List   Diagnosis Date Noted  . Vertigo 09/07/2016  . Dyspnea 09/07/2016  . Lightheadedness 09/06/2016  . Healthcare maintenance 07/30/2016  . Dizziness, nonspecific 07/30/2016  . Left foot drop 07/30/2016  . Left-sided weakness 07/30/2016  . 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 History of stroke associated with blood clotting tendency 01/02/2016  .  disease (HCC) 10/27/2015  . Hb-SS disease with acute chest syndrome (HCC) 08/14/2015  . Acute chest syndrome (HCC)   . Acute respiratory failure with hypoxemia (HCC)   . Community acquired pneumonia 08/08/2015  . Hemiparesis following cerebrovascular accident (CVA) (HCC) 05/30/2015  . Ankle gives out 05/30/2015  . Seizure disorder (HCC) 05/30/2015  . 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 H/O: stroke 03/12/2015  . Seizures (HCC) 03/12/2015  . Sickle cell disease (HCC) 03/11/2015  . Sickle cell crisis (HCC) 03/11/2015  . Symptomatic anemia 03/11/2015   Past Medical History:  Past Medical History:  Diagnosis Date  . Anemia   . Blood transfusion without reported diagnosis   . Seizures (HCC)    one time incident, may have been r/t to a pain medication she was prescribed  . Sickle cell disease (HCC)   . Stroke [facility](HCC)    Past Surgical History:  Past Surgical History:  Procedure Laterality Date  . CESAREAN SECTION     x4  . CHOLECYSTECTOMY    . PORTACATH PLACEMENT Right w-3  . reverse tubal ligation     HPI:  Patient is a 35 y.o. female with reports of SOB and dizziness and PMH: sickle cell. h/o RMCA with L residual hemiparesis. PE and acute infact  both r/o.   Assessment / Plan / Recommendation Clinical Impression  Patient appeared a little drowsy, likely from medication, as she reported that she had taken benedryl for a persistent itch all over her body of unknown etiology. Patient's cognitive-linguistic and speech abilities were all within normal limits as per this assessment. Patient denies any memory, speech or language problems. Patient exhibited good safety awareness, verbal reasoning and problem solving.    SLP Assessment  Patient does not need any further Speech Lanaguage Pathology Services    Follow Up Recommendations  None    Frequency and Duration           SLP Evaluation Cognition  Overall Cognitive Status: Within Functional Limits for tasks assessed Orientation Level: Oriented X4 Memory: Appears intact Awareness: Appears intact Problem Solving: Appears intact Safety/Judgment: Appears intact       Comprehension  Auditory Comprehension Overall Auditory Comprehension: Appears within functional limits for tasks assessed    Expression Expression Primary Mode of Expression: Verbal Verbal Expression Overall Verbal Expression: Appears within functional limits for tasks assessed   Oral / Motor  Oral Motor/Sensory Function Overall Oral Motor/Sensory Function: Within functional limits Motor Speech Overall Motor Speech: Appears within functional limits for tasks assessed Resonance: Within functional limits Articulation: Within functional limitis Motor Planning: Witnin functional limits   GO          Functional Assessment Tool Used: clinical judgement Functional Limitations: Spoken language expressive Spoken Language Comprehension Current Status [phone](G9159): 0 percent impaired, limited or restricted Spoken Language Comprehension Goal Status (G9160): 0 percent impaired, limited or restricted Spoken Language  Comprehension Discharge Status [phone](G9161): 0 percent impaired, limited or restricted Spoken Language Expression  Current Status [phone](G9162): 0 percent impaired, limited or restricted Spoken Language Expression Goal Status (W2956(G9163): 0 percent impaired, limited or restricted Spoken Language Expression Discharge Status [phone](G9164): 0 percent impaired, limited or restricted         Angela NevinJohn T. Briget Shaheed, MA, CCC-SLP 09/07/16 4:19 PM

## 2016-09-08 ENCOUNTER — Inpatient Hospital Stay (HOSPITAL_COMMUNITY): Payer: Medicaid Other

## 2016-09-08 DIAGNOSIS — D596 Hemoglobinuria due to hemolysis from other external causes: Secondary | ICD-10-CM

## 2016-09-08 DIAGNOSIS — Z8673 Personal history of transient ischemic attack (TIA), and cerebral infarction without residual deficits: Secondary | ICD-10-CM

## 2016-09-08 DIAGNOSIS — I519 Heart disease, unspecified: Secondary | ICD-10-CM

## 2016-09-08 DIAGNOSIS — I6789 Other cerebrovascular disease: Secondary | ICD-10-CM

## 2016-09-08 MED ORDER — DIPHENHYDRAMINE HCL 50 MG/ML IJ SOLN
12.5000 mg | Freq: Once | INTRAMUSCULAR | Status: AC
  Administered 2016-09-08: 12.5 mg via INTRAVENOUS
  Filled 2016-09-08: qty 1

## 2016-09-08 NOTE — Progress Notes (Signed)
PHARMACIST - PHYSICIAN COMMUNICATION  DR:  Ashley RoyaltyMatthews CONCERNING: IV to Oral Route Change Policy  RECOMMENDATION: This patient is receiving diphenhydramine by the intravenous route.  Based on criteria approved by the Pharmacy and Therapeutics Committee, intravenous diphenhydramine is being converted to the equivalent oral dose form(s).   DESCRIPTION: These criteria include:  Diphenhydramine is not prescribed to treat or prevent a severe allergic reaction  Diphenhydramine is not prescribed as premedication prior to receiving blood product, biologic medication, antimicrobial, or chemotherapy agent  The patient has tolerated at least one dose of an oral or enteral medication  The patient has no evidence of active gastrointestinal bleeding or impaired GI absorption (gastrectomy, short bowel, patient on TNA or NPO).  The patient is not undergoing procedural sedation   If you have questions about this conversion, please contact the Pharmacy Department  []   [phone]( [phone] )  [facility] []   [phone]( [phone] )  [facility] []   [phone]( [phone] )  [facility] []   [phone]( [phone] )  [facility] [x]   [phone]( [phone] )  [facility]   Junita PushMichelle Sharifah Champine, PharmD, BCPS Pager: [phone] 09/08/2016@12 :12 PM

## 2016-09-08 NOTE — Progress Notes (Signed)
*  PRELIMINARY RESULTS* Vascular Ultrasound Carotid Duplex has been completed.  Preliminary findings: Bilateral: No significant ICA stenosis. Antegrade vertebral flow.  Elevated velocities noted in the left carotid system without evidence of plaque formation to suggest stenosis, etiology unknown.  Farrel DemarkJill Eunice, RDMS, RVT  09/08/2016, 9:11 AM

## 2016-09-08 NOTE — Care Management Note (Signed)
Case Management Note  Patient Details  Name: Debra Williamson MRN: [id number] Date of Birth: 1980/12/08  Subjective/Objective:   Shortness of breath, dizziness, Sickle Cell Anemia                 Action/Plan: Discharge Planning: NCM spoke to pt and states she [facility]. Her next appt is scheduled for 11/08/2016 at 1030 am. Has follow appt with obgyn, Wynelle BourgeoisMarie Williams on 09/18/2016 at 1:00 pm. Pt states she has Keppra and Lamictal at home. States she is out of her pain meds. [facility]. States she is working on getting her Medicaid reinstated.   PCP Henrietta HooverBERNHARDT, LINDA C MD  Expected Discharge Date:                Expected Discharge Plan:  Home/Self Care  In-House Referral:  NA  Discharge planning Services  CM Consult  Post Acute Care Choice:  NA Choice offered to:  NA  DME Arranged:  N/A DME Agency:  NA  HH Arranged:  NA HH Agency:  NA  Status of Service:  Completed, signed off  If discussed at Long Length of Stay Meetings, dates discussed:    Additional Comments:  Elliot CousinShavis, Rodd Heft Ellen, RN 09/08/2016, 11:54 AM

## 2016-09-08 NOTE — Progress Notes (Signed)
0630 pt has light nose bleed.  BP is 104/51.  Ice and light pressure applied to bridge of nose and hob is elevated.  And bleeding stopped at [facility]0645

## 2016-09-08 NOTE — Progress Notes (Signed)
   [facility] has been requested to perform a transesophageal echocardiogram on Debra Williamson for recent stroke and RA mass on TTE.  After careful review of history and examination, the risks and benefits of transesophageal echocardiogram have been explained including risks of esophageal damage, perforation (1:10,000 risk), bleeding, pharyngeal hematoma as well as other potential complications associated with conscious sedation including aspiration, arrhythmia, respiratory failure and death. Alternatives to treatment were discussed, questions were answered. Patient is willing to proceed.   Tereso NewcomerScott Weaver, PA-C  09/08/2016 1:41 PM

## 2016-09-08 NOTE — Progress Notes (Addendum)
SICKLE CELL SERVICE PROGRESS NOTE  Debra Williamson NWG:[id number] DOB: 1980/12/06 DOA: 09/06/2016 PCP: Concepcion Living, NP  Assessment/Plan: Active Problems:   Symptomatic anemia   H/O: stroke   Seizure disorder (HCC)   History of stroke associated with blood clotting tendency   Dizziness, nonspecific   Left-sided weakness   Lightheadedness   Vertigo   Dyspnea  1. Vertigo:Resolved. This was likely secondary to anemia. 2. Hb SS with crisis:: Pt has had very little pain since resuming Oxycontin and Oxycodone. She has required very little Dilaudid. Continue current regimen.   3. Possible Right Atrial Mass: Will require TEE. Spoke with Dr. Mayford Knife and they will arrange for tomorrow. Pt to be NPO after MN. 4. Anemia of Chronic Disease: Pt is known to have high hemolysis.Her Hb was at 6.3 g/dL on admission and patient was symptomatic thus she received a transfusion of 1 unit RBC's. At present her Hb is 9.5 g/dL post-transfusion.  5. Tachycardia: Resolved. D/C telemetry. 6. H/O CVA: She also has a h/o CVA and was being seen by Dr.Redding-Lallinger at [facility] who had planned chronic exchange transfusions for secondary stroke prophylaxis. However when patient started going to the [facility] she thought she was seeing a Hematologist thus she substituted their advice for that of her Hematologist. I have advised patient that she should continue to follow with her Hematologist and pursue the chronic exchanges as she does not want to take Hydrea in consideration of another pregnancy. 7. H/O Seizures: Continue Keppra. No recent seizure activity per patient. 8. Leukocytosis: Resolved after transfusion. Likely represented increased bone marrow activity in response to hemolysis.  9. Chronic pain: Pt has been taking OxyContin for many years and I will continue OxyContin 30 mg BID.    Code Status: Full Code Family Communication: N/A Disposition Plan: Hospitalization prolonged by new finding on TTE. Anticipate  discharge after TEE completed tomorrow.  Debra Williamson A.  Pager [phone]. If 7PM-7AM, please contact night-coverage.  09/08/2016, 12:06 PM  LOS: 1 day   Interim History: Pt reports that her dizziness has resolved. She states that she she had some mild pain earlier which has resolved completely with immediate release Oxycodone. Her baseline pain is 3-5/10 with OxyContin and she can manage levels of 5-6/10 with PRN oxycodone.   Consultants:  Cardiology for TEE  Procedures:  None  Antibiotics:  None   Objective: Vitals:   09/07/16 1852 09/07/16 2040 09/07/16 2348 09/08/16 0645  BP: (!) 112/57 110/62 116/80 (!) 104/51  Pulse: 80 87 90 95  Resp: 18 16 20 18   Temp: 98.6 F (37 C) 98.2 F (36.8 C) 98.4 F (36.9 C) 99.1 F (37.3 C)  TempSrc: Oral Oral Oral Oral  SpO2: 92% 99% 99% 95%  Weight:      Height:       Weight change:   Intake/Output Summary (Last 24 hours) at 09/08/16 1206 Last data filed at 09/08/16 1102  Gross per 24 hour  Intake          2411.67 ml  Output             4450 ml  Net         -2038.33 ml    General: Alert, awake, oriented x3, in no acute distress.  HEENT: [place]/AT PEERL, EOMI, mild icterus. Neck: Trachea midline,  no masses, no thyromegal,y no JVD, no carotid bruit OROPHARYNX:  Moist, No exudate/ erythema/lesions.  Heart: Regular rate and rhythm, without murmurs, rubs, gallops, PMI non-displaced, no heaves or thrills on palpation.  Lungs: Clear to auscultation, no wheezing or rhonchi noted. No increased vocal fremitus resonant to percussion  Abdomen: Soft, nontender, nondistended, positive bowel sounds, no masses no hepatosplenomegaly noted..  Neuro: No focal neurological deficits noted cranial nerves II through XII grossly intact.  Strength at functional baseline in bilateral upper and lower extremities. Musculoskeletal: No warm swelling or erythema around joints, no spinal tenderness noted. Psychiatric: Patient alert and oriented x3, good  insight and cognition, good recent to remote recall.    Data Reviewed: Basic Metabolic Panel:  Recent Labs Lab 09/06/16 2235 09/07/16 0337  NA 137  --   K 4.5  --   CL 105  --   CO2 25  --   GLUCOSE 105*  --   BUN 9  --   CREATININE 0.60  --   CALCIUM 9.5  --   MG  --  1.9  PHOS  --  3.9   Liver Function Tests:  Recent Labs Lab 09/06/16 2235  AST 69*  ALT 44  ALKPHOS 72  BILITOT 3.0*  PROT 8.1  ALBUMIN 4.5   No results for input(s): LIPASE, AMYLASE in the last 168 hours. No results for input(s): AMMONIA in the last 168 hours. CBC:  Recent Labs Lab 09/06/16 2235 09/07/16 0946  WBC 17.2* 4.8  NEUTROABS 12.4*  --   HGB 6.3* 9.5*  HCT 17.2* 28.2*  MCV 102.4* 102.5*  PLT 387 88*   Cardiac Enzymes:  Recent Labs Lab 09/07/16 0337  TROPONINI <0.03   BNP (last 3 results) No results for input(s): BNP in the last 8760 hours.  ProBNP (last 3 results) No results for input(s): PROBNP in the last 8760 hours.  CBG: No results for input(s): GLUCAP in the last 168 hours.  No results found for this or any previous visit (from the past 240 hour(s)).   Studies: Dg Chest 2 View  Result Date: 09/06/2016 CLINICAL DATA:  Shortness of breath and dizziness. Sickle cell disease. EXAM: CHEST  2 VIEW COMPARISON:  Chest radiograph 06/21/2016 FINDINGS: Dual-lumen right chest wall Port-A-Cath is in unchanged position with tip at the cavoatrial junction. Cardiomediastinal contours are normal. No pleural effusion or pneumothorax. No focal airspace consolidation or pulmonary edema. IMPRESSION: No active cardiopulmonary disease. Electronically Signed   By: Deatra RobinsonKevin  Herman M.D.   On: 09/06/2016 22:20   Ct Head Wo Contrast  Result Date: 09/07/2016 CLINICAL DATA:  Chronic intermittent dizziness. History of CVA. Initial encounter. EXAM: CT HEAD WITHOUT CONTRAST TECHNIQUE: Contiguous axial images were obtained from the base of the skull through the vertex without intravenous contrast.  COMPARISON:  None. FINDINGS: Brain: No evidence of acute infarction, hemorrhage, hydrocephalus, extra-axial collection or mass lesion/mass effect. There is a chronic infarct involving much of the right cerebral hemisphere, with diffuse encephalomalacia. There is mild involvement of the right basal ganglia. Mild rightward midline shift likely reflects the degree of right-sided volume loss. The brainstem and fourth ventricle are within normal limits. Vascular: No hyperdense vessel or unexpected calcification. Skull: There is no evidence of fracture; visualized osseous structures are unremarkable in appearance. Sinuses/Orbits: The orbits are within normal limits. The paranasal sinuses and mastoid air cells are well-aerated. Other: No significant soft tissue abnormalities are seen. IMPRESSION: 1. No acute intracranial pathology seen on CT. 2. Chronic infarct involving much of the right cerebral hemisphere, with diffuse encephalomalacia. Mild rightward midline shift likely reflects some degree of right-sided volume loss. Electronically Signed   By: Roanna RaiderJeffery  Chang M.D.   On: 09/07/2016 00:52  Ct Angio Chest Pe W And/or Wo Contrast  Result Date: 09/07/2016 CLINICAL DATA:  Acute onset of shortness of breath and dizziness. Initial encounter. EXAM: CT ANGIOGRAPHY CHEST WITH CONTRAST TECHNIQUE: Multidetector CT imaging of the chest was performed using the standard protocol during bolus administration of intravenous contrast. Multiplanar CT image reconstructions and MIPs were obtained to evaluate the vascular anatomy. CONTRAST:  100 mL of Isovue 370 IV contrast COMPARISON:  Chest radiograph performed 09/06/2016, and CTA of the chest performed 08/08/2015 FINDINGS: Cardiovascular:  There is no evidence of pulmonary embolus. The heart is unremarkable in appearance. The thoracic aorta is within normal limits. The great vessels are unremarkable. No calcific atherosclerotic disease is seen. Mediastinum/Nodes: The mediastinum is  unremarkable appearance. No mediastinal lymphadenopathy is seen. No pericardial effusion is identified. The visualized portions of the thyroid gland are unremarkable. No axillary lymphadenopathy is seen. Lungs/Pleura: Minimal bibasilar atelectasis is noted. No pleural effusion or pneumothorax is seen. No masses are identified. Upper Abdomen: The visualized portions of the liver are unremarkable. The spleen is diffusely calcified and diminutive in appearance. The visualized portions of the pancreas, adrenal glands and kidneys are within normal limits. Musculoskeletal: No acute osseous abnormalities are identified. The visualized musculature is unremarkable in appearance. Review of the MIP images confirms the above findings. IMPRESSION: 1. No evidence of pulmonary embolus. 2. Minimal bibasilar atelectasis noted.  Lungs otherwise clear. 3. Diffusely calcified diminutive spleen noted. Electronically Signed   By: Roanna Raider M.D.   On: 09/07/2016 02:01   Mr Brain Wo Contrast  Result Date: 09/07/2016 CLINICAL DATA:  TIA.  Vertigo. EXAM: MRI HEAD WITHOUT CONTRAST MRA HEAD WITHOUT CONTRAST TECHNIQUE: Multiplanar, multiecho pulse sequences of the brain and surrounding structures were obtained without intravenous contrast. Angiographic images of the head were obtained using MRA technique without contrast. COMPARISON:  Head CT from earlier today FINDINGS: MRI HEAD FINDINGS Brain: Extensive post ischemic gliosis and encephalomalacia in the right cerebral hemisphere which occurred remotely as reflected in the remodeled calvarium. Ischemic changes are most dense in the MCA territory, but also affect the distal ACA territory. The right cerebral hemisphere is asymmetrically atrophic in the right PCA distribution with watershed area infarcts, attributed to chronic ischemia. The left hemisphere appears normal. Wallerian degeneration seen in the deep white matter tracts and brainstem. No acute infarct, hemorrhage,  hydrocephalus, or mass. Vascular: Described below. Skull and upper cervical spine: Hypointense marrow in the setting of anemia. Expanded calvarium in this patient with seizure disorder. Sinuses/Orbits: No acute finding. MRA HEAD FINDINGS Diminutive but patent right ICA seen to the level of the ophthalmic segment beyond which there is flow gap and reconstitution in the setting of lenticulostriate collaterals and a sizable posterior communicating artery. There are numerous lenticulostriate collaterals with a moyamoya type appearance, both anterior and right lateral. Faint flow seen within bilateral ACA and right MCA branches. Proximal right PCA occlusion with reconstitution via multiple collaterals. Robust flow in the left MCA and PCA distributions. Hyper trophic appearance of middle meningeal arteries. IMPRESSION: 1. No acute finding. 2. Extensive chronic right cerebral infarct as described above. 3. Minimal flow in the right ICA that is occluded at the cavernous segment and reconstitution by posterior communicating artery. Poor flow in the bilateral ACA and right MCA branches in the setting of bilateral A1 and right M1 occlusion. Well established lenticulostriate collaterals. 4. Proximal right PCA occlusion with reconstitution by regional collaterals. 5. Good flow in the left MCA and left PCA distributions with large left posterior  communicating artery. Electronically Signed   By: Marnee SpringJonathon  Watts M.D.   On: 09/07/2016 08:54   Mr Maxine GlennMra Head/brain ZOWo Cm  Result Date: 09/07/2016 CLINICAL DATA:  TIA.  Vertigo. EXAM: MRI HEAD WITHOUT CONTRAST MRA HEAD WITHOUT CONTRAST TECHNIQUE: Multiplanar, multiecho pulse sequences of the brain and surrounding structures were obtained without intravenous contrast. Angiographic images of the head were obtained using MRA technique without contrast. COMPARISON:  Head CT from earlier today FINDINGS: MRI HEAD FINDINGS Brain: Extensive post ischemic gliosis and encephalomalacia in the  right cerebral hemisphere which occurred remotely as reflected in the remodeled calvarium. Ischemic changes are most dense in the MCA territory, but also affect the distal ACA territory. The right cerebral hemisphere is asymmetrically atrophic in the right PCA distribution with watershed area infarcts, attributed to chronic ischemia. The left hemisphere appears normal. Wallerian degeneration seen in the deep white matter tracts and brainstem. No acute infarct, hemorrhage, hydrocephalus, or mass. Vascular: Described below. Skull and upper cervical spine: Hypointense marrow in the setting of anemia. Expanded calvarium in this patient with seizure disorder. Sinuses/Orbits: No acute finding. MRA HEAD FINDINGS Diminutive but patent right ICA seen to the level of the ophthalmic segment beyond which there is flow gap and reconstitution in the setting of lenticulostriate collaterals and a sizable posterior communicating artery. There are numerous lenticulostriate collaterals with a moyamoya type appearance, both anterior and right lateral. Faint flow seen within bilateral ACA and right MCA branches. Proximal right PCA occlusion with reconstitution via multiple collaterals. Robust flow in the left MCA and PCA distributions. Hyper trophic appearance of middle meningeal arteries. IMPRESSION: 1. No acute finding. 2. Extensive chronic right cerebral infarct as described above. 3. Minimal flow in the right ICA that is occluded at the cavernous segment and reconstitution by posterior communicating artery. Poor flow in the bilateral ACA and right MCA branches in the setting of bilateral A1 and right M1 occlusion. Well established lenticulostriate collaterals. 4. Proximal right PCA occlusion with reconstitution by regional collaterals. 5. Good flow in the left MCA and left PCA distributions with large left posterior communicating artery. Electronically Signed   By: Marnee SpringJonathon  Watts M.D.   On: 09/07/2016 08:54    Scheduled Meds: .  aspirin EC  81 mg Oral QPM  . folic acid  1 mg Oral Daily  . lamoTRIgine  200 mg Oral BID  . levETIRAcetam  1,000 mg Oral BID  . mouth rinse  15 mL Mouth Rinse BID  . oxyCODONE  10 mg Oral Q4H  . oxyCODONE  30 mg Oral Q12H  . senna-docusate  1 tablet Oral BID  . sodium chloride flush  10-40 mL Intracatheter Q12H   Continuous Infusions:   Active Problems:   Symptomatic anemia   H/O: stroke   Seizure disorder (HCC)   History of stroke associated with blood clotting tendency   Dizziness, nonspecific   Left-sided weakness   Lightheadedness   Vertigo   Dyspnea  In excess of 25 minutes spent during this visit. Greater than 50% involved face to face contact with the patient for assessment, counseling and coordination of care.

## 2016-09-08 NOTE — Evaluation (Signed)
   Occupational Therapy Evaluation Patient Details Name: Debra Williamson MRN: [id number] DOB: [birth year] Today's Date: 09/08/2016    History of Present Illness pt with reports of SOB and dizziness, with sickle cell. h/o RMCA with L residual hemiparesis. ruled out PE and acute infact.  Pt independent at home except husband drives.    Clinical Impression   Pt admitted with SOB and dizziness. Pt currently with functional limitations due to the deficits listed below (see OT Problem List). Pt will benefit from skilled OT to increase their safety and independence with ADL and functional mobility for ADL to facilitate discharge to venue listed below.      Follow Up Recommendations  Outpatient OT    Equipment Recommendations     Resting hand splint     Precautions / Restrictions Precautions Precautions: None Restrictions Weight Bearing Restrictions: No      Mobility Bed Mobility Overal bed mobility: Independent                Transfers Overall transfer level: Modified independent                         ADL Overall ADL's : At baseline                                                       Pertinent Vitals/Pain Pain Assessment: No/denies pain        Extremity/Trunk Assessment Upper Extremity Assessment Upper Extremity Assessment: LUE deficits/detail LUE Deficits / Details: LUE impaired from old CVA- needs a resting hand splint           Communication Communication Communication: No difficulties   Cognition Arousal/Alertness: Awake/alert Behavior During Therapy: WFL for tasks assessed/performed Overall Cognitive Status: Within Functional Limits for tasks assessed                     General Comments   Educated pt on positioning as well as ROM LUE as pt reports increased tone and tightness.  Pt would benefit from OP OT as well as a resting hand splint for night time.  Discussed with MD            Home Living  Family/patient expects to be discharged to:: Private residence Living Arrangements: Spouse/significant other Available Help at Discharge: Family Type of Home: Apartment Home Access: Level entry (but moving into a new location with steps, however ot reports no issues with steps )     Home Layout: One level               Home Equipment: None          Prior Functioning/Environment Level of Independence: Independent                 OT Problem List: Decreased strength;Decreased range of motion      OT Goals(Current goals can be found in the care plan section) Acute Rehab OT Goals Patient Stated Goal: this left arm has gotten tight  OT Frequency:                End of Session Nurse Communication: Mobility status  Activity Tolerance: Patient tolerated treatment well Patient left: in bed;with call bell/phone within reach    [facility]RED[facility], Debra Williamson 09/08/2016, 1:17 PM

## 2016-09-09 ENCOUNTER — Encounter (HOSPITAL_COMMUNITY)
Admission: EM | Disposition: A | Discharge status: Home / Self Care | Payer: Self-pay | Source: Home / Self Care | Attending: Internal Medicine

## 2016-09-09 ENCOUNTER — Other Ambulatory Visit (HOSPITAL_COMMUNITY): Payer: Medicaid Other

## 2016-09-09 DIAGNOSIS — R040 Epistaxis: Principal | ICD-10-CM

## 2016-09-09 DIAGNOSIS — D649 Anemia, unspecified: Secondary | ICD-10-CM

## 2016-09-09 DIAGNOSIS — D62 Acute posthemorrhagic anemia: Secondary | ICD-10-CM

## 2016-09-09 DIAGNOSIS — D571 Sickle-cell disease without crisis: Secondary | ICD-10-CM

## 2016-09-09 LAB — CBC WITH DIFFERENTIAL/PLATELET
Basophils Absolute: 0.2 10*3/uL — ABNORMAL HIGH (ref 0.0–0.1)
Basophils Absolute: 0.2 10*3/uL — ABNORMAL HIGH (ref 0.0–0.1)
Basophils Relative: 1 %
Basophils Relative: 1 %
Eosinophils Absolute: 0.6 10*3/uL (ref 0.0–0.7)
Eosinophils Absolute: 0.6 10*3/uL (ref 0.0–0.7)
Eosinophils Relative: 4 %
Eosinophils Relative: 4 %
HCT: 17.6 % — ABNORMAL LOW (ref 36.0–46.0)
HCT: 21.4 % — ABNORMAL LOW (ref 36.0–46.0)
Hemoglobin: 6.4 g/dL — CL (ref 12.0–15.0)
Hemoglobin: 7.8 g/dL — ABNORMAL LOW (ref 12.0–15.0)
Lymphocytes Relative: 27 %
Lymphocytes Relative: 26 %
Lymphs Abs: 4.1 10*3/uL — ABNORMAL HIGH (ref 0.7–4.0)
Lymphs Abs: 4.1 10*3/uL — ABNORMAL HIGH (ref 0.7–4.0)
MCH: 35.2 pg — ABNORMAL HIGH (ref 26.0–34.0)
MCH: 33.3 pg (ref 26.0–34.0)
MCHC: 36.4 g/dL — ABNORMAL HIGH (ref 30.0–36.0)
MCHC: 36.4 g/dL — ABNORMAL HIGH (ref 30.0–36.0)
MCV: 96.7 fL (ref 78.0–100.0)
MCV: 91.5 fL (ref 78.0–100.0)
Monocytes Absolute: 2 10*3/uL — ABNORMAL HIGH (ref 0.1–1.0)
Monocytes Absolute: 1.9 10*3/uL — ABNORMAL HIGH (ref 0.1–1.0)
Monocytes Relative: 13 %
Monocytes Relative: 12 %
Neutro Abs: 8.1 10*3/uL — ABNORMAL HIGH (ref 1.7–7.7)
Neutro Abs: 8.9 10*3/uL — ABNORMAL HIGH (ref 1.7–7.7)
Neutrophils Relative %: 55 %
Neutrophils Relative %: 57 %
Platelets: 477 10*3/uL — ABNORMAL HIGH (ref 150–400)
Platelets: 503 10*3/uL — ABNORMAL HIGH (ref 150–400)
RBC: 1.82 MIL/uL — ABNORMAL LOW (ref 3.87–5.11)
RBC: 2.34 MIL/uL — ABNORMAL LOW (ref 3.87–5.11)
RDW: 25.6 % — ABNORMAL HIGH (ref 11.5–15.5)
RDW: 23.3 % — ABNORMAL HIGH (ref 11.5–15.5)
WBC: 15 10*3/uL — ABNORMAL HIGH (ref 4.0–10.5)
WBC: 15.7 10*3/uL — ABNORMAL HIGH (ref 4.0–10.5)
nRBC: 2 /100 WBC — ABNORMAL HIGH

## 2016-09-09 LAB — RETICULOCYTES
RBC.: 1.74 MIL/uL — ABNORMAL LOW (ref 3.87–5.11)
Retic Ct Pct: >23 % — ABNORMAL HIGH (ref 0.4–3.1)

## 2016-09-09 LAB — BASIC METABOLIC PANEL
Anion gap: 7 (ref 5–15)
BUN: 11 mg/dL (ref 6–20)
CO2: 25 mmol/L (ref 22–32)
Calcium: 9.1 mg/dL (ref 8.9–10.3)
Chloride: 104 mmol/L (ref 101–111)
Creatinine, Ser: 0.42 mg/dL — ABNORMAL LOW (ref 0.44–1.00)
GFR calc Af Amer: >60 mL/min (ref >60)
GFR calc non Af Amer: >60 mL/min (ref >60)
Glucose, Bld: 96 mg/dL (ref 65–99)
Potassium: 4.7 mmol/L (ref 3.5–5.1)
Sodium: 136 mmol/L (ref 135–145)

## 2016-09-09 LAB — HEMOGLOBIN A1C
Hgb A1c MFr Bld: <4.2 % — ABNORMAL LOW (ref 4.8–5.6)
Mean Plasma Glucose: <74 mg/dL

## 2016-09-09 LAB — PREPARE RBC (CROSSMATCH)

## 2016-09-09 LAB — LACTATE DEHYDROGENASE: LDH: 406 U/L — ABNORMAL HIGH (ref 98–192)

## 2016-09-09 SURGERY — ECHOCARDIOGRAM, TRANSESOPHAGEAL
Anesthesia: Monitor Anesthesia Care

## 2016-09-09 MED ORDER — SODIUM CHLORIDE 0.9 % IV SOLN
INTRAVENOUS | Status: DC
  Administered 2016-09-09: 18:00:00 via INTRAVENOUS

## 2016-09-09 MED ORDER — OXYMETAZOLINE HCL 0.05 % NA SOLN
1.0000 | Freq: Two times a day (BID) | NASAL | Status: DC
  Administered 2016-09-09 (×2): 1 via NASAL
  Filled 2016-09-09: qty 15

## 2016-09-09 MED ORDER — DIPHENHYDRAMINE HCL 50 MG/ML IJ SOLN
12.5000 mg | Freq: Once | INTRAMUSCULAR | Status: AC
  Administered 2016-09-09: 12.5 mg via INTRAVENOUS
  Filled 2016-09-09: qty 1

## 2016-09-09 MED ORDER — SALINE SPRAY 0.65 % NA SOLN
1.0000 | NASAL | Status: DC | PRN
  Filled 2016-09-09: qty 44

## 2016-09-09 MED ORDER — CAMPHOR-MENTHOL 0.5-0.5 % EX LOTN
TOPICAL_LOTION | CUTANEOUS | Status: DC | PRN
  Administered 2016-09-09: 22:00:00 via TOPICAL
  Filled 2016-09-09: qty 222

## 2016-09-09 MED ORDER — DIPHENHYDRAMINE HCL 50 MG PO CAPS
50.0000 mg | ORAL_CAPSULE | ORAL | Status: DC | PRN
  Administered 2016-09-09 – 2016-09-10 (×2): 50 mg via ORAL
  Filled 2016-09-09 (×2): qty 1

## 2016-09-09 MED ORDER — DIPHENHYDRAMINE HCL 50 MG/ML IJ SOLN
12.5000 mg | Freq: Once | INTRAMUSCULAR | Status: AC
  Administered 2016-09-09: 12.5 mg via INTRAVENOUS

## 2016-09-09 MED ORDER — SODIUM CHLORIDE 0.9 % IV SOLN
Freq: Once | INTRAVENOUS | Status: AC
  Administered 2016-09-09: 12:00:00 via INTRAVENOUS

## 2016-09-09 NOTE — Care Management Note (Signed)
Case Management Note  Patient Details  Name: Debra Williamson MRN: [id number] Date of Birth: 06-29-1981  Subjective/Objective:  OT-recc-otpt OT-provided patient w/otpt rehab listing resource-patient voiced understanding.                  Action/Plan:d/c home.   Expected Discharge Date:                  Expected Discharge Plan:  Home/Self Care  In-House Referral:  NA  Discharge planning Services  CM Consult  Post Acute Care Choice:  NA Choice offered to:  NA  DME Arranged:  N/A DME Agency:  NA  HH Arranged:  NA HH Agency:  NA  Status of Service:  Completed, signed off  If discussed at Long Length of Stay Meetings, dates discussed:    Additional Comments:  Debra Williamson, Islam Eichinger, RN 09/09/2016, 2:04 PM

## 2016-09-09 NOTE — Progress Notes (Signed)
SICKLE CELL SERVICE PROGRESS NOTE  Debra Williamson NWG:[id number] DOB: [birth year] DOA: 09/06/2016 PCP: Concepcion Living, NP  Assessment/Plan: Active Problems:   Symptomatic anemia   H/O: stroke   Seizure disorder (HCC)   History of stroke associated with blood clotting tendency   Dizziness, nonspecific   Left-sided weakness   Lightheadedness   Vertigo   Dyspnea  1. Acute Blood Loss Anemia: Pt had 2 episodes of epistaxis leadign to decreased Hb. She also reports that she has been having nosebleeds at home and had a big nosebleed before coming to the hospital this time. Her Hb was down to 6.4 g/dL this morning and patient was transfused 1 unit of blood.  2. Epistaxis: Pt was prescribed Afrin this morning and Ocean Spray. She has had no further episodes of epistaxis since receiving the afrin. 3. Possible Right Atrial Mass: Pt was scheduled for TEE this morning. However it was cancelled due to drop in Hb. It has been scheduled fot tomorrow at 2: 00 pm at [facility].  4. Hypoxia: Pt had saturations down in 80's. After transfusion saturation 96% on RA.  5. Vertigo:Resolved. This was likely secondary to anemia. 6. Hb SS with crisis:: Pt has had very little pain since resuming Oxycontin and Oxycodone. She has required very little Dilaudid. Continue current regimen.   7. Possible Right Atrial Mass: Anemia of Chronic Disease: Pt is known to have high hemolysis.Her Hb was at 6.3 g/dL on admission and patient was symptomatic thus she received a transfusion of 1 unit RBC's. At present her Hb is 9.5 g/dL post-transfusion.  8. Tachycardia: Resolved. D/C telemetry. 9. H/O CVA: She also has a h/o CVA and was being seen by Dr.Redding-Lallinger at [facility] who had planned chronic exchange transfusions for secondary stroke prophylaxis. However when patient started going to the [facility] she thought she was seeing a Hematologist thus she substituted their advice for that of her Hematologist. I have advised patient that she  should continue to follow with her Hematologist and pursue the chronic exchanges as she does not want to take Hydrea in consideration of another pregnancy. 10. H/O Seizures: Continue Keppra. No recent seizure activity per patient. 11. Leukocytosis: Resolved after transfusion. Likely represented increased bone marrow activity in response to hemolysis.  12. Chronic pain: Pt has been taking OxyContin for many years and I will continue OxyContin 30 mg BID.    Code Status: Full Code Family Communication: N/A Disposition Plan: Hospitalization prolonged by new finding on TTE. Anticipate discharge after TEE completed tomorrow.  Dezeray Puccio A.  Pager [phone]. If 7PM-7AM, please contact night-coverage.  09/09/2016, 4:57 PM  LOS: 2 days   Interim History: Pt reports that her dizziness has resolved. She states that she she had some mild pain earlier which has resolved completely with immediate release Oxycodone. Her baseline pain is 3-5/10 with OxyContin and she can manage levels of 5-6/10 with PRN oxycodone.   Consultants:  Cardiology for TEE  Procedures:  None  Antibiotics:  None   Objective: Vitals:   09/09/16 0812 09/09/16 1146 09/09/16 1230 09/09/16 1445  BP:  113/73 113/69 112/63  Pulse:  94 95 96  Resp:  16 16 18   Temp:  98.9 F (37.2 C) 98.6 F (37 C) 98.4 F (36.9 C)  TempSrc:  Oral Oral Axillary  SpO2: 94% 98% 99% 93%  Weight:      Height:       Weight change:   Intake/Output Summary (Last 24 hours) at 09/09/16 1657 Last data filed at 09/09/16  1440  Gross per 24 hour  Intake             1370 ml  Output             2501 ml  Net            -1131 ml    General: Alert, awake, oriented x3, in no acute distress. Well appearing.  HEENT: [place]/AT PEERL, EOMI, mild icterus. Neck: Trachea midline,  no masses, no thyromegal,y no JVD, no carotid bruit OROPHARYNX:  Moist, No exudate/ erythema/lesions.  Heart: Regular rate and rhythm, without murmurs, rubs, gallops,  PMI non-displaced, no heaves or thrills on palpation.  Lungs: Clear to auscultation, no wheezing or rhonchi noted. No increased vocal fremitus resonant to percussion  Abdomen: Soft, nontender, nondistended, positive bowel sounds, no masses no hepatosplenomegaly noted..  Neuro: No focal neurological deficits noted cranial nerves II through XII grossly intact.  Strength at functional baseline in bilateral upper and lower extremities. Musculoskeletal: No warmth swelling or erythema around joints, no spinal tenderness noted. Psychiatric: Patient alert and oriented x3, good insight and cognition, good recent to remote recall.    Data Reviewed: Basic Metabolic Panel:  Recent Labs Lab 09/06/16 2235 09/07/16 0337 09/09/16 0509  NA 137  --  136  K 4.5  --  4.7  CL 105  --  104  CO2 25  --  25  GLUCOSE 105*  --  96  BUN 9  --  11  CREATININE 0.60  --  0.42*  CALCIUM 9.5  --  9.1  MG  --  1.9  --   PHOS  --  3.9  --    Liver Function Tests:  Recent Labs Lab 09/06/16 2235  AST 69*  ALT 44  ALKPHOS 72  BILITOT 3.0*  PROT 8.1  ALBUMIN 4.5   No results for input(s): LIPASE, AMYLASE in the last 168 hours. No results for input(s): AMMONIA in the last 168 hours. CBC:  Recent Labs Lab 09/06/16 2235 09/07/16 0946 09/09/16 0509  WBC 17.2* 4.8 15.0*  NEUTROABS 12.4*  --  8.1*  HGB 6.3* 9.5* 6.4*  HCT 17.2* 28.2* 17.6*  MCV 102.4* 102.5* 96.7  PLT 387 88* 477*   Cardiac Enzymes:  Recent Labs Lab 09/07/16 0337  TROPONINI <0.03   BNP (last 3 results) No results for input(s): BNP in the last 8760 hours.  ProBNP (last 3 results) No results for input(s): PROBNP in the last 8760 hours.  CBG: No results for input(s): GLUCAP in the last 168 hours.  No results found for this or any previous visit (from the past 240 hour(s)).   Studies: Dg Chest 2 View  Result Date: 09/06/2016 CLINICAL DATA:  Shortness of breath and dizziness. Sickle cell disease. EXAM: CHEST  2 VIEW  COMPARISON:  Chest radiograph 06/21/2016 FINDINGS: Dual-lumen right chest wall Port-A-Cath is in unchanged position with tip at the cavoatrial junction. Cardiomediastinal contours are normal. No pleural effusion or pneumothorax. No focal airspace consolidation or pulmonary edema. IMPRESSION: No active cardiopulmonary disease. Electronically Signed   By: Deatra Robinson M.D.   On: 09/06/2016 22:20   Ct Head Wo Contrast  Result Date: 09/07/2016 CLINICAL DATA:  Chronic intermittent dizziness. History of CVA. Initial encounter. EXAM: CT HEAD WITHOUT CONTRAST TECHNIQUE: Contiguous axial images were obtained from the base of the skull through the vertex without intravenous contrast. COMPARISON:  None. FINDINGS: Brain: No evidence of acute infarction, hemorrhage, hydrocephalus, extra-axial collection or mass lesion/mass effect. There is  a chronic infarct involving much of the right cerebral hemisphere, with diffuse encephalomalacia. There is mild involvement of the right basal ganglia. Mild rightward midline shift likely reflects the degree of right-sided volume loss. The brainstem and fourth ventricle are within normal limits. Vascular: No hyperdense vessel or unexpected calcification. Skull: There is no evidence of fracture; visualized osseous structures are unremarkable in appearance. Sinuses/Orbits: The orbits are within normal limits. The paranasal sinuses and mastoid air cells are well-aerated. Other: No significant soft tissue abnormalities are seen. IMPRESSION: 1. No acute intracranial pathology seen on CT. 2. Chronic infarct involving much of the right cerebral hemisphere, with diffuse encephalomalacia. Mild rightward midline shift likely reflects some degree of right-sided volume loss. Electronically Signed   By: Roanna RaiderJeffery  Chang M.D.   On: 09/07/2016 00:52   Ct Angio Chest Pe W And/or Wo Contrast  Result Date: 09/07/2016 CLINICAL DATA:  Acute onset of shortness of breath and dizziness. Initial encounter.  EXAM: CT ANGIOGRAPHY CHEST WITH CONTRAST TECHNIQUE: Multidetector CT imaging of the chest was performed using the standard protocol during bolus administration of intravenous contrast. Multiplanar CT image reconstructions and MIPs were obtained to evaluate the vascular anatomy. CONTRAST:  100 mL of Isovue 370 IV contrast COMPARISON:  Chest radiograph performed 09/06/2016, and CTA of the chest performed 08/08/2015 FINDINGS: Cardiovascular:  There is no evidence of pulmonary embolus. The heart is unremarkable in appearance. The thoracic aorta is within normal limits. The great vessels are unremarkable. No calcific atherosclerotic disease is seen. Mediastinum/Nodes: The mediastinum is unremarkable appearance. No mediastinal lymphadenopathy is seen. No pericardial effusion is identified. The visualized portions of the thyroid gland are unremarkable. No axillary lymphadenopathy is seen. Lungs/Pleura: Minimal bibasilar atelectasis is noted. No pleural effusion or pneumothorax is seen. No masses are identified. Upper Abdomen: The visualized portions of the liver are unremarkable. The spleen is diffusely calcified and diminutive in appearance. The visualized portions of the pancreas, adrenal glands and kidneys are within normal limits. Musculoskeletal: No acute osseous abnormalities are identified. The visualized musculature is unremarkable in appearance. Review of the MIP images confirms the above findings. IMPRESSION: 1. No evidence of pulmonary embolus. 2. Minimal bibasilar atelectasis noted.  Lungs otherwise clear. 3. Diffusely calcified diminutive spleen noted. Electronically Signed   By: Roanna RaiderJeffery  Chang M.D.   On: 09/07/2016 02:01   Mr Brain Wo Contrast  Result Date: 09/07/2016 CLINICAL DATA:  TIA.  Vertigo. EXAM: MRI HEAD WITHOUT CONTRAST MRA HEAD WITHOUT CONTRAST TECHNIQUE: Multiplanar, multiecho pulse sequences of the brain and surrounding structures were obtained without intravenous contrast. Angiographic  images of the head were obtained using MRA technique without contrast. COMPARISON:  Head CT from earlier today FINDINGS: MRI HEAD FINDINGS Brain: Extensive post ischemic gliosis and encephalomalacia in the right cerebral hemisphere which occurred remotely as reflected in the remodeled calvarium. Ischemic changes are most dense in the MCA territory, but also affect the distal ACA territory. The right cerebral hemisphere is asymmetrically atrophic in the right PCA distribution with watershed area infarcts, attributed to chronic ischemia. The left hemisphere appears normal. Wallerian degeneration seen in the deep white matter tracts and brainstem. No acute infarct, hemorrhage, hydrocephalus, or mass. Vascular: Described below. Skull and upper cervical spine: Hypointense marrow in the setting of anemia. Expanded calvarium in this patient with seizure disorder. Sinuses/Orbits: No acute finding. MRA HEAD FINDINGS Diminutive but patent right ICA seen to the level of the ophthalmic segment beyond which there is flow gap and reconstitution in the setting of lenticulostriate collaterals and  a sizable posterior communicating artery. There are numerous lenticulostriate collaterals with a moyamoya type appearance, both anterior and right lateral. Faint flow seen within bilateral ACA and right MCA branches. Proximal right PCA occlusion with reconstitution via multiple collaterals. Robust flow in the left MCA and PCA distributions. Hyper trophic appearance of middle meningeal arteries. IMPRESSION: 1. No acute finding. 2. Extensive chronic right cerebral infarct as described above. 3. Minimal flow in the right ICA that is occluded at the cavernous segment and reconstitution by posterior communicating artery. Poor flow in the bilateral ACA and right MCA branches in the setting of bilateral A1 and right M1 occlusion. Well established lenticulostriate collaterals. 4. Proximal right PCA occlusion with reconstitution by regional  collaterals. 5. Good flow in the left MCA and left PCA distributions with large left posterior communicating artery. Electronically Signed   By: Marnee Spring M.D.   On: 09/07/2016 08:54   Mr Maxine Glenn Head/brain ZO Cm  Result Date: 09/07/2016 CLINICAL DATA:  TIA.  Vertigo. EXAM: MRI HEAD WITHOUT CONTRAST MRA HEAD WITHOUT CONTRAST TECHNIQUE: Multiplanar, multiecho pulse sequences of the brain and surrounding structures were obtained without intravenous contrast. Angiographic images of the head were obtained using MRA technique without contrast. COMPARISON:  Head CT from earlier today FINDINGS: MRI HEAD FINDINGS Brain: Extensive post ischemic gliosis and encephalomalacia in the right cerebral hemisphere which occurred remotely as reflected in the remodeled calvarium. Ischemic changes are most dense in the MCA territory, but also affect the distal ACA territory. The right cerebral hemisphere is asymmetrically atrophic in the right PCA distribution with watershed area infarcts, attributed to chronic ischemia. The left hemisphere appears normal. Wallerian degeneration seen in the deep white matter tracts and brainstem. No acute infarct, hemorrhage, hydrocephalus, or mass. Vascular: Described below. Skull and upper cervical spine: Hypointense marrow in the setting of anemia. Expanded calvarium in this patient with seizure disorder. Sinuses/Orbits: No acute finding. MRA HEAD FINDINGS Diminutive but patent right ICA seen to the level of the ophthalmic segment beyond which there is flow gap and reconstitution in the setting of lenticulostriate collaterals and a sizable posterior communicating artery. There are numerous lenticulostriate collaterals with a moyamoya type appearance, both anterior and right lateral. Faint flow seen within bilateral ACA and right MCA branches. Proximal right PCA occlusion with reconstitution via multiple collaterals. Robust flow in the left MCA and PCA distributions. Hyper trophic appearance of  middle meningeal arteries. IMPRESSION: 1. No acute finding. 2. Extensive chronic right cerebral infarct as described above. 3. Minimal flow in the right ICA that is occluded at the cavernous segment and reconstitution by posterior communicating artery. Poor flow in the bilateral ACA and right MCA branches in the setting of bilateral A1 and right M1 occlusion. Well established lenticulostriate collaterals. 4. Proximal right PCA occlusion with reconstitution by regional collaterals. 5. Good flow in the left MCA and left PCA distributions with large left posterior communicating artery. Electronically Signed   By: Marnee Spring M.D.   On: 09/07/2016 08:54    Scheduled Meds: . aspirin EC  81 mg Oral QPM  . folic acid  1 mg Oral Daily  . lamoTRIgine  200 mg Oral BID  . levETIRAcetam  1,000 mg Oral BID  . mouth rinse  15 mL Mouth Rinse BID  . oxyCODONE  10 mg Oral Q4H  . oxyCODONE  30 mg Oral Q12H  . oxymetazoline  1 spray Each Nare BID  . senna-docusate  1 tablet Oral BID  . sodium chloride flush  10-40 mL Intracatheter Q12H   Continuous Infusions:   Active Problems:   Symptomatic anemia   H/O: stroke   Seizure disorder (HCC)   History of stroke associated with blood clotting tendency   Dizziness, nonspecific   Left-sided weakness   Lightheadedness   Vertigo   Dyspnea  In excess of 25 minutes spent during this visit. Greater than 50% involved face to face contact with the patient for assessment, counseling and coordination of care.

## 2016-09-09 NOTE — Progress Notes (Signed)
HGB returns as 6.4 this AM.  K./ Schorr notified.  No episodes of bleeding noted and VSS

## 2016-09-09 NOTE — Progress Notes (Signed)
TEE originally scheduled for today. D/w Dr. Ashley RoyaltyMatthews (primary) on patient who requests TEE be scheduled for tomorrow instead given drop in Hgb & issues with nosebleeds overnight. Tentative orders are back in. She is scheduled for 11am tomorrow with Dr. Duke Salviaandolph. PA Tereso NewcomerScott Weaver already reviewed procedure, risks, benefits with pt on 09/08/16.  Please notify cardiology service if procedure needs to be delayed further. Dayna Dunn PA-C

## 2016-09-09 NOTE — Progress Notes (Signed)
Occupational Therapy Treatment Patient Details Name: Gloriajean DellShanequa Mahler MRN: [id number] DOB: 04-18-1981 Today's Date: 09/09/2016    History of present illness pt with reports of SOB and dizziness, with sickle cell. h/o RMCA with L residual hemiparesis. ruled out PE and acute infact.  Pt independent at home except husband drives.    OT comments  OT session focused on LUE stretching and positioning.  Pts L scapula very tight as well as hand. Educated in exercises as well as positioning for LUE. Pt needs a resting hand splint. OT will work on this today. Pt would also benefit from OP OT for NM reed as pt feels LUE has increased tone in last few months.    Pt able to demonstrate some shoulder flexion as well as elbow flexion and extension.  Pt did not have any active wrist or finger movement but does have increased tone in L hand/ fingers that decreased with prolonged stretching and postioning.   Follow Up Recommendations  Outpatient OT    Equipment Recommendations  None recommended by OT       Precautions / Restrictions Precautions Precautions: None Restrictions Weight Bearing Restrictions: No       Mobility Bed Mobility Overal bed mobility: Independent                Transfers Overall transfer level: Modified independent                        ADL Overall ADL's : At baseline                                                        Cognition   Behavior During Therapy: WFL for tasks assessed/performed Overall Cognitive Status: Within Functional Limits for tasks assessed                               General Comments      Pertinent Vitals/ Pain       Pain Assessment: No/denies pain  Home Living                                              Frequency  Min 2X/week        Progress Toward Goals  OT Goals(current goals can now be found in the care plan section)  Progress towards OT goals:  Progressing toward goals     Plan Discharge plan remains appropriate       End of Session     Activity Tolerance Patient tolerated treatment well   Patient Left in bed;with call bell/phone within reach   Nurse Communication Mobility status        Time: [id number] OT Time Calculation (min): 26 min  Charges: OT Treatments $Neuromuscular Re-education: 23-37 mins  Arayla Kruschke D 09/09/2016, 10:00 AM

## 2016-09-10 ENCOUNTER — Encounter (HOSPITAL_COMMUNITY)
Admission: EM | Disposition: A | Discharge status: Home / Self Care | Payer: Self-pay | Source: Home / Self Care | Attending: Internal Medicine

## 2016-09-10 ENCOUNTER — Inpatient Hospital Stay (HOSPITAL_COMMUNITY): Payer: Medicaid Other | Admitting: Anesthesiology

## 2016-09-10 ENCOUNTER — Inpatient Hospital Stay (HOSPITAL_COMMUNITY): Payer: Medicaid Other

## 2016-09-10 ENCOUNTER — Encounter (HOSPITAL_COMMUNITY): Payer: Self-pay | Admitting: *Deleted

## 2016-09-10 DIAGNOSIS — R531 Weakness: Secondary | ICD-10-CM

## 2016-09-10 DIAGNOSIS — G459 Transient cerebral ischemic attack, unspecified: Secondary | ICD-10-CM

## 2016-09-10 DIAGNOSIS — R269 Unspecified abnormalities of gait and mobility: Secondary | ICD-10-CM

## 2016-09-10 DIAGNOSIS — I639 Cerebral infarction, unspecified: Secondary | ICD-10-CM

## 2016-09-10 HISTORY — PX: TEE WITHOUT CARDIOVERSION: SHX5443

## 2016-09-10 LAB — TYPE AND SCREEN
ABO/RH(D): A POS
Antibody Screen: NEGATIVE
Unit division: 0
Unit division: 0

## 2016-09-10 LAB — VAS US CAROTID
LEFT ECA DIAS: 24 cm/s
LEFT VERTEBRAL DIAS: 40 cm/s
Left CCA dist dias: -42 cm/s
Left CCA dist sys: -180 cm/s
Left CCA prox dias: 34 cm/s
Left CCA prox sys: 195 cm/s
Left ICA dist dias: -70 cm/s
Left ICA dist sys: -191 cm/s
Left ICA prox dias: -38 cm/s
Left ICA prox sys: -157 cm/s
RIGHT ECA DIAS: -17 cm/s
RIGHT VERTEBRAL DIAS: 30 cm/s
Right CCA prox dias: 7 cm/s
Right CCA prox sys: 141 cm/s
Right cca dist sys: -59 cm/s

## 2016-09-10 SURGERY — ECHOCARDIOGRAM, TRANSESOPHAGEAL
Anesthesia: Monitor Anesthesia Care

## 2016-09-10 MED ORDER — LIDOCAINE HCL (CARDIAC) 20 MG/ML IV SOLN
INTRAVENOUS | Status: DC | PRN
  Administered 2016-09-10: 50 mg via INTRATRACHEAL

## 2016-09-10 MED ORDER — PROPOFOL 10 MG/ML IV BOLUS
INTRAVENOUS | Status: DC | PRN
  Administered 2016-09-10 (×5): 20 mg via INTRAVENOUS

## 2016-09-10 MED ORDER — MIDAZOLAM HCL 5 MG/5ML IJ SOLN
INTRAMUSCULAR | Status: DC | PRN
  Administered 2016-09-10: 2 mg via INTRAVENOUS

## 2016-09-10 MED ORDER — HEPARIN SOD (PORK) LOCK FLUSH 100 UNIT/ML IV SOLN
500.0000 [IU] | INTRAVENOUS | Status: AC | PRN
  Administered 2016-09-10: 500 [IU]

## 2016-09-10 MED ORDER — HEPARIN SOD (PORK) LOCK FLUSH 10 UNIT/ML IV SOLN: 10.0000 [IU] | Freq: Once | INTRAVENOUS | Status: DC

## 2016-09-10 MED ORDER — DIPHENHYDRAMINE HCL 50 MG/ML IJ SOLN
INTRAMUSCULAR | Status: DC | PRN
  Administered 2016-09-10: 25 mg via INTRAVENOUS

## 2016-09-10 MED ORDER — HEPARIN SOD (PORK) LOCK FLUSH 100 UNIT/ML IV SOLN: 500.0000 [IU] | INTRAVENOUS | Status: DC | PRN

## 2016-09-10 MED ORDER — BUTAMBEN-TETRACAINE-BENZOCAINE 2-2-14 % EX AERO
INHALATION_SPRAY | CUTANEOUS | Status: DC | PRN
  Administered 2016-09-10: 2 via TOPICAL

## 2016-09-10 NOTE — Op Note (Signed)
INDICATIONS: atrial fibrillation  PROCEDURE:   Informed consent was obtained prior to the procedure. The risks, benefits and alternatives for the procedure were discussed and the patient comprehended these risks.  Risks include, but are not limited to, cough, sore throat, vomiting, nausea, somnolence, esophageal and stomach trauma or perforation, bleeding, low blood pressure, aspiration, pneumonia, infection, trauma to the teeth and death.    After a procedural time-out, the oropharynx was anesthetized with 20% benzocaine spray.   During this procedure the patient was administered IV propofol by Anesthesiology, Dr. Maple HudsonMoser.  The transesophageal probe was inserted in the esophagus and stomach without difficulty and multiple views were obtained.  The patient was kept under observation until the patient left the procedure room.  The patient left the procedure room in stable condition.   Agitated microbubble saline contrast was administered.  COMPLICATIONS:    There were no immediate complications.  FINDINGS:  No cardioembolic source.  There is no intracardiac shunt. There is some mild pulmonary "recirculation" of saline contrast, consider pulmonary AVMs. The right atrial "mass" is the tip of a Port-a-Cath that reaches to the tricuspid annulus.   Time Spent Directly with the Patient:  30 minutes   Ander Wamser 09/10/2016, 1:49 PM

## 2016-09-10 NOTE — Progress Notes (Signed)
  Echocardiogram Echocardiogram Transesophageal has been performed.  Janalyn HarderWest, [place]er [place] R 09/10/2016, 1:55 PM

## 2016-09-10 NOTE — Transfer of Care (Signed)
Immediate Anesthesia Transfer of Care Note  Patient: Debra Williamson  Procedure(s) Performed: Procedure(s): TRANSESOPHAGEAL ECHOCARDIOGRAM (TEE) (N/A)  Patient Location: Endoscopy Unit  Anesthesia Type:General  Level of Consciousness: awake, alert  and oriented  Airway & Oxygen Therapy: Patient Spontanous Breathing and Patient connected to nasal cannula oxygen  Post-op Assessment: Report given to RN, Post -op Vital signs reviewed and stable and Patient moving all extremities X 4  Post vital signs: Reviewed and stable  Last Vitals:  Vitals:   09/10/16 0602 09/10/16 1251  BP: (!) 109/56 (!) 119/56  Pulse: 87 91  Resp: 10 (!) 21  Temp: 37.1 C 36.8 C    Last Pain:  Vitals:   09/10/16 1251  TempSrc: Oral  PainSc:       Patients Stated Pain Goal: 3 (09/10/16 0238)  Complications: No apparent anesthesia complications

## 2016-09-10 NOTE — Progress Notes (Signed)
Orthopedic Tech Progress Note Patient Details:  Gloriajean DellShanequa Cwik 03-15-1981 [id number] Brace completed by bio-tech Patient ID: Gloriajean DellShanequa Arena, female   DOB: 03-15-1981, 35 y.o.   MRN: [id number]   Jennye MoccasinHughes, Shandi Godfrey Craig 09/10/2016, 2:40 PM

## 2016-09-10 NOTE — Progress Notes (Signed)
Patient is discharged. Instructions reviewed, questions, concerns denied. Patient is A&Ox4, ambulatory

## 2016-09-10 NOTE — Anesthesia Postprocedure Evaluation (Signed)
Anesthesia Post Note  Patient: Gloriajean DellShanequa Johansson  Procedure(s) Performed: Procedure(s) (LRB): TRANSESOPHAGEAL ECHOCARDIOGRAM (TEE) (N/A)  Patient location during evaluation: Endoscopy Anesthesia Type: MAC Level of consciousness: awake Pain management: pain level controlled Vital Signs Assessment: post-procedure vital signs reviewed and stable Respiratory status: spontaneous breathing Cardiovascular status: stable Postop Assessment: no signs of nausea or vomiting Anesthetic complications: no    Last Vitals:  Vitals:   09/10/16 1355 09/10/16 1405  BP: 125/73 119/64  Pulse: 97 91  Resp: 17 13  Temp:      Last Pain:  Vitals:   09/10/16 1251  TempSrc: Oral  PainSc:                  Mallarie Voorhies

## 2016-09-10 NOTE — Discharge Summary (Signed)
Debra Williamson MRN: [id number] DOB/AGE: August 06, 1981 35 y.o.  Admit date: 09/06/2016 Discharge date: 09/10/2016  Primary Care Physician:  Concepcion Living, NP   Recommendations Post-Discharge:  1. Referral to outpatient Occupational Therapy.    Discharge Diagnoses:   Patient Active Problem List   Diagnosis Date Noted  . TIA (transient ischemic attack)   . Vertigo 09/07/2016  . Dyspnea 09/07/2016  . Lightheadedness 09/06/2016  . Healthcare maintenance 07/30/2016  . Dizziness, nonspecific 07/30/2016  . Left foot drop 07/30/2016  . Left-sided weakness 07/30/2016  . 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 History of stroke associated with blood clotting tendency 01/02/2016  . 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 Hb-SS disease with acute chest syndrome (HCC) 08/14/2015  . Acute chest syndrome (HCC)   . Acute respiratory failure with hypoxemia (HCC)   . Community acquired pneumonia 08/08/2015  . Hemiparesis following cerebrovascular accident (CVA) (HCC) 05/30/2015  . Ankle gives out 05/30/2015  . Seizure disorder (HCC) 05/30/2015  . 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 H/O: stroke 03/12/2015  . Seizures (HCC) 03/12/2015  . Sickle cell disease (HCC) 03/11/2015  . Sickle cell crisis (HCC) 03/11/2015  . Symptomatic anemia 03/11/2015    DISCHARGE MEDICATION:   Medication List    TAKE these medications   aspirin EC 81 MG tablet Take 1 tablet by mouth every evening.   diclofenac sodium 1 % Gel Commonly known as:  VOLTAREN Apply 2 g topically 4 (four) times daily.   folic acid 1 MG tablet Commonly known as:  FOLVITE Take 1 mg by mouth daily.   lamoTRIgine 200 MG tablet Commonly known as:  LAMICTAL Take 1 tablet (200 mg total) by mouth 2 (two) times daily.   levETIRAcetam 1000 MG tablet Commonly known as:  KEPPRA Take 1 tablet  (1,000 mg total) by mouth 2 (two) times daily.   promethazine 25 MG tablet Commonly known as:  PHENERGAN 1 tablet every 8 hours prn What changed:  how much to take  how to take this  when to take this  reasons to take this  additional instructions   vitamin C 1000 MG tablet Take 1,000 mg by mouth every evening.   zolpidem 5 MG tablet Commonly known as:  AMBIEN Take 1 tablet (5 mg total) by mouth at bedtime as needed. sleep         Consults:    SIGNIFICANT DIAGNOSTIC STUDIES:  Dg Chest 2 View  Result Date: 09/06/2016 CLINICAL DATA:  Shortness of breath and dizziness. Sickle cell disease. EXAM: CHEST  2 VIEW COMPARISON:  Chest radiograph 06/21/2016 FINDINGS: Dual-lumen right chest wall Port-A-Cath is in unchanged position with tip at the cavoatrial junction. Cardiomediastinal contours are normal. No pleural effusion or pneumothorax. No focal airspace consolidation or pulmonary edema. IMPRESSION: No active cardiopulmonary disease. Electronically Signed   By: Deatra Robinson M.D.   On: 09/06/2016 22:20   Ct Head Wo Contrast  Result Date: 09/07/2016 CLINICAL DATA:  Chronic intermittent dizziness. History of CVA. Initial encounter. EXAM: CT HEAD WITHOUT CONTRAST TECHNIQUE: Contiguous axial images were obtained from the base of the skull through the vertex without intravenous contrast. COMPARISON:  None. FINDINGS: Brain: No evidence of acute infarction, hemorrhage, hydrocephalus, extra-axial collection or mass lesion/mass effect. There is a chronic infarct involving much of the right cerebral hemisphere, with diffuse encephalomalacia. There is mild involvement of the right basal ganglia. Mild rightward midline shift likely reflects the  degree of right-sided volume loss. The brainstem and fourth ventricle are within normal limits. Vascular: No hyperdense vessel or unexpected calcification. Skull: There is no evidence of fracture; visualized osseous structures are unremarkable in  appearance. Sinuses/Orbits: The orbits are within normal limits. The paranasal sinuses and mastoid air cells are well-aerated. Other: No significant soft tissue abnormalities are seen. IMPRESSION: 1. No acute intracranial pathology seen on CT. 2. Chronic infarct involving much of the right cerebral hemisphere, with diffuse encephalomalacia. Mild rightward midline shift likely reflects some degree of right-sided volume loss. Electronically Signed   By: Roanna RaiderJeffery  Chang M.D.   On: 09/07/2016 00:52   Ct Angio Chest Pe W And/or Wo Contrast  Result Date: 09/07/2016 CLINICAL DATA:  Acute onset of shortness of breath and dizziness. Initial encounter. EXAM: CT ANGIOGRAPHY CHEST WITH CONTRAST TECHNIQUE: Multidetector CT imaging of the chest was performed using the standard protocol during bolus administration of intravenous contrast. Multiplanar CT image reconstructions and MIPs were obtained to evaluate the vascular anatomy. CONTRAST:  100 mL of Isovue 370 IV contrast COMPARISON:  Chest radiograph performed 09/06/2016, and CTA of the chest performed 08/08/2015 FINDINGS: Cardiovascular:  There is no evidence of pulmonary embolus. The heart is unremarkable in appearance. The thoracic aorta is within normal limits. The great vessels are unremarkable. No calcific atherosclerotic disease is seen. Mediastinum/Nodes: The mediastinum is unremarkable appearance. No mediastinal lymphadenopathy is seen. No pericardial effusion is identified. The visualized portions of the thyroid gland are unremarkable. No axillary lymphadenopathy is seen. Lungs/Pleura: Minimal bibasilar atelectasis is noted. No pleural effusion or pneumothorax is seen. No masses are identified. Upper Abdomen: The visualized portions of the liver are unremarkable. The spleen is diffusely calcified and diminutive in appearance. The visualized portions of the pancreas, adrenal glands and kidneys are within normal limits. Musculoskeletal: No acute osseous  abnormalities are identified. The visualized musculature is unremarkable in appearance. Review of the MIP images confirms the above findings. IMPRESSION: 1. No evidence of pulmonary embolus. 2. Minimal bibasilar atelectasis noted.  Lungs otherwise clear. 3. Diffusely calcified diminutive spleen noted. Electronically Signed   By: Roanna RaiderJeffery  Chang M.D.   On: 09/07/2016 02:01   Mr Brain Wo Contrast  Result Date: 09/07/2016 CLINICAL DATA:  TIA.  Vertigo. EXAM: MRI HEAD WITHOUT CONTRAST MRA HEAD WITHOUT CONTRAST TECHNIQUE: Multiplanar, multiecho pulse sequences of the brain and surrounding structures were obtained without intravenous contrast. Angiographic images of the head were obtained using MRA technique without contrast. COMPARISON:  Head CT from earlier today FINDINGS: MRI HEAD FINDINGS Brain: Extensive post ischemic gliosis and encephalomalacia in the right cerebral hemisphere which occurred remotely as reflected in the remodeled calvarium. Ischemic changes are most dense in the MCA territory, but also affect the distal ACA territory. The right cerebral hemisphere is asymmetrically atrophic in the right PCA distribution with watershed area infarcts, attributed to chronic ischemia. The left hemisphere appears normal. Wallerian degeneration seen in the deep white matter tracts and brainstem. No acute infarct, hemorrhage, hydrocephalus, or mass. Vascular: Described below. Skull and upper cervical spine: Hypointense marrow in the setting of anemia. Expanded calvarium in this patient with seizure disorder. Sinuses/Orbits: No acute finding. MRA HEAD FINDINGS Diminutive but patent right ICA seen to the level of the ophthalmic segment beyond which there is flow gap and reconstitution in the setting of lenticulostriate collaterals and a sizable posterior communicating artery. There are numerous lenticulostriate collaterals with a moyamoya type appearance, both anterior and right lateral. Faint flow seen within bilateral  ACA and right  MCA branches. Proximal right PCA occlusion with reconstitution via multiple collaterals. Robust flow in the left MCA and PCA distributions. Hyper trophic appearance of middle meningeal arteries. IMPRESSION: 1. No acute finding. 2. Extensive chronic right cerebral infarct as described above. 3. Minimal flow in the right ICA that is occluded at the cavernous segment and reconstitution by posterior communicating artery. Poor flow in the bilateral ACA and right MCA branches in the setting of bilateral A1 and right M1 occlusion. Well established lenticulostriate collaterals. 4. Proximal right PCA occlusion with reconstitution by regional collaterals. 5. Good flow in the left MCA and left PCA distributions with large left posterior communicating artery. Electronically Signed   By: Marnee Spring M.D.   On: 09/07/2016 08:54   Mr Maxine Glenn Head/brain ZO Cm  Result Date: 09/07/2016 CLINICAL DATA:  TIA.  Vertigo. EXAM: MRI HEAD WITHOUT CONTRAST MRA HEAD WITHOUT CONTRAST TECHNIQUE: Multiplanar, multiecho pulse sequences of the brain and surrounding structures were obtained without intravenous contrast. Angiographic images of the head were obtained using MRA technique without contrast. COMPARISON:  Head CT from earlier today FINDINGS: MRI HEAD FINDINGS Brain: Extensive post ischemic gliosis and encephalomalacia in the right cerebral hemisphere which occurred remotely as reflected in the remodeled calvarium. Ischemic changes are most dense in the MCA territory, but also affect the distal ACA territory. The right cerebral hemisphere is asymmetrically atrophic in the right PCA distribution with watershed area infarcts, attributed to chronic ischemia. The left hemisphere appears normal. Wallerian degeneration seen in the deep white matter tracts and brainstem. No acute infarct, hemorrhage, hydrocephalus, or mass. Vascular: Described below. Skull and upper cervical spine: Hypointense marrow in the setting of anemia.  Expanded calvarium in this patient with seizure disorder. Sinuses/Orbits: No acute finding. MRA HEAD FINDINGS Diminutive but patent right ICA seen to the level of the ophthalmic segment beyond which there is flow gap and reconstitution in the setting of lenticulostriate collaterals and a sizable posterior communicating artery. There are numerous lenticulostriate collaterals with a moyamoya type appearance, both anterior and right lateral. Faint flow seen within bilateral ACA and right MCA branches. Proximal right PCA occlusion with reconstitution via multiple collaterals. Robust flow in the left MCA and PCA distributions. Hyper trophic appearance of middle meningeal arteries. IMPRESSION: 1. No acute finding. 2. Extensive chronic right cerebral infarct as described above. 3. Minimal flow in the right ICA that is occluded at the cavernous segment and reconstitution by posterior communicating artery. Poor flow in the bilateral ACA and right MCA branches in the setting of bilateral A1 and right M1 occlusion. Well established lenticulostriate collaterals. 4. Proximal right PCA occlusion with reconstitution by regional collaterals. 5. Good flow in the left MCA and left PCA distributions with large left posterior communicating artery. Electronically Signed   By: Marnee Spring M.D.   On: 09/07/2016 08:54     ECHO:   Impressions:  - LVEF 60-65%, normal wall thickness, normal wall motion, normal   diastolic function, moderate LAE, mild RAE, cannot exclude PFO,   small mobile mass seen in the RA measuring 1.2 x 1.8 cm in the   subcostal and apical 4 chamber views - recommend TEE to further   evaluate as a possible embolic source of stroke in the setting of   possible PFO, mild TR, RVSP 33 mmHg, normal IVC.   OTHER PROCEDURES:TEE FINDINGS:  No cardioembolic source.  There is no intracardiac shunt. There is some mild pulmonary "recirculation" of saline contrast, consider pulmonary AVMs. The right atrial  "mass"  is the tip of a Port-a-Cath that reaches to the tricuspid annulus.  No results found for this or any previous visit (from the past 240 hour(s)).  BRIEF ADMITTING H & P: Debra Williamson is a 35 y.o. female with medical history significant of sickle cell disease, stroke at the age of 35 resulting in left-sided weakness, seizure disorder    Presented with vertigo that has been on and off for the past 1 month. She has been feeling weak for the past 1 week.  She's been having intermittent dizziness prior to this not positional but has been getting progressively worse for past few days she feels dizzy when she sits still sometimes but gets better when she closes her eyes and relaxes patient reports she's been having some shortness of breath that has been getting worse. No blood in Stool. NO heavy menses. She has had some joint pain for the past 1 week and occasional headache. She states that her joint pain sometimes can be controlled with her home medications. Denies any chest pain. NO sensation of fullness in her ears. No slurred speech.   Voice different reports she has been leaning to the left somewhat more than usual and been having generalized fatigue. 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 She has sickle cell disorder and had required blood transfusions in the past.  Reports last seizure was  year ago.    Hospital Course:  Present on Admission: . Symptomatic anemia . Dyspnea Pt was admitted with dizziness, hypoxia, SOB and joint pains. She was also noted to have a Hb of 6.3 g/dL. She was transfused 1 unit RBC's and symptoms resolved. Hb came up to 9.5 g/dL until she had epistaxis x 2 episodes and a subsequent decrease in Hb to 6.4 which was felt to be due to acute blood loss as well as hemolysis. Pt does have a h/o CVA and ECHO and MRI  were ordered on admission. The MRI showed no acute changes, However the ECHO revealed a possible mass in the RA. I spoke with Dr. Mayford Knifeurner from Cardiology who recommended a TEE. However due to the decrease in Hb the Cardiologists did required Hb above 7 to proceed with TEE. She was transfused 1 more unit RBC and Hb improved to 7.8 g/dL. The TEE was performed with results as above. With regard to the episodes of epistaxis, the patient reported that in retrospect, she has had several "nosebleeds" in the last several months. She will need a referral to ENT as an out-patient. Regarding the SCD, she had minimal pains as she was without her usual oral analgesics. However once OxyContin and Oxycodone were resumed, the pain resolved to baseline and patient required only a few intermittent doses of IV Dilaudid. Pt also has contractures of LUE residual fronm her previous CVA. She was seen by OT and a [employer] resting hand splint and recommended Outpatient Occupational Therapy.  She is discharged home in good condition and will follow up with her PMD as needed and wit her Hematologist Dr. Lyla Glassingedding-Lallinger in [place] to arrange for chronic transfusions for secondary stroke prophylaxis.   Disposition and Follow-up: Discharged home in good condition.    DISCHARGE EXAM:  General: Alert, awake, oriented x3, in mild distress.  HEENT: [place]/AT PEERL, EOMI, mild icterus at baseline Neck: Trachea midline, no masses, no thyromegal,y no JVD, no carotid  bruit OROPHARYNX: Moist, No exudate/ erythema/lesions.  Heart: Regular rate and rhythm, without murmurs, rubs, gallops or S3. PMI non-displaced. Exam reveals no decreased pulses. Pulmonary/Chest: Normal effort. Breath sounds normal. No. Apnea. Clear to auscultation,no stridor,  no wheezing and no rhonchi noted. No respiratory distress and no tenderness noted. Abdomen: Soft, nontender, nondistended, normal bowel sounds, no masses no hepatosplenomegaly noted. No fluid wave and no  ascites. There is no guarding or rebound. Neuro: Alert and oriented to person, place and time. Normal motor skills, Displays no atrophy or tremors and exhibits normal muscle tone.  No focal neurological deficits noted cranial nerves II through XII grossly intact. No sensory deficit noted.  Strength at baseline in bilateral upper and lower extremities. Pt has an abnormal gait pattern residual from her previous CVA that is unchanged.  Musculoskeletal: No warmth swelling or erythema around joints, no spinal tenderness noted. Psychiatric: Patient alert and oriented x3, good insight and cognition, good recent to remote recall. Lymph node survey: No cervical axillary or inguinal lymphadenopathy noted. Skin: Skin is warm and dry. No bruising, no ecchymosis and no rash noted. Pt is not diaphoretic. No erythema. No pallor Genitalia: External genitalia normal. Vaginal vault shows healthy tissue on visual inspection. No foul smelling discharge. Pt exhibits good lubrication with physiological discharge. No adnexal tenderness noted however there is a soft tissue mass felt on the anterior left vaginal vault.  Psychiatric: Mood, memory, affect and judgement normal   Blood pressure 116/65, pulse 96, temperature 98.2 F (36.8 C), temperature source Oral, resp. rate 15, height 5\' 4"  (1.626 m), weight 66 kg (145 lb 6.4 oz), last menstrual period 08/23/2016, SpO2 100 %.   Recent Labs  09/09/16 0509  NA 136  K 4.7  CL 104  CO2 25  GLUCOSE 96  BUN 11  CREATININE 0.42*  CALCIUM 9.1   No results for input(s): AST, ALT, ALKPHOS, BILITOT, PROT, ALBUMIN in the last 72 hours. No results for input(s): LIPASE, AMYLASE in the last 72 hours.  Recent Labs  09/09/16 0509 09/09/16 1810  WBC 15.0* 15.7*  NEUTROABS 8.1* 8.9*  HGB 6.4* 7.8*  HCT 17.6* 21.4*  MCV 96.7 91.5  PLT 477* 503*     Total time spent including face to face and decision making was greater than 30 minutes  Signed: Ewa Hipp  A. 09/10/2016, 3:28 PM

## 2016-09-10 NOTE — Anesthesia Preprocedure Evaluation (Signed)
Anesthesia Evaluation  Patient identified by MRN, date of birth, ID band Patient awake    Reviewed: Allergy & Precautions, NPO status , Patient's Chart, lab work & pertinent test results  History of Anesthesia Complications Negative for: history of anesthetic complications  Airway Mallampati: II  TM Distance: >3 FB Neck ROM: Full    Dental  (+) Teeth Intact,    Pulmonary shortness of breath,    breath sounds clear to auscultation       Cardiovascular  Rhythm:Regular  Possible RA mass   Neuro/Psych Seizures -,  CVA    GI/Hepatic negative GI ROS, Neg liver ROS,   Endo/Other  negative endocrine ROS  Renal/GU negative Renal ROS     Musculoskeletal   Abdominal   Peds  Hematology  (+) Sickle cell anemia and anemia ,   Anesthesia Other Findings   Reproductive/Obstetrics                             Anesthesia Physical Anesthesia Plan  ASA: III  Anesthesia Plan: MAC   Post-op Pain Management:    Induction: Intravenous  Airway Management Planned: Natural Airway, Nasal Cannula and Simple Face Mask  Additional Equipment: None  Intra-op Plan:   Post-operative Plan:   Informed Consent: I have reviewed the patients History and Physical, chart, labs and discussed the procedure including the risks, benefits and alternatives for the proposed anesthesia with the patient or authorized representative who has indicated his/her understanding and acceptance.   Dental advisory given  Plan Discussed with: CRNA and Surgeon  Anesthesia Plan Comments:         Anesthesia Quick Evaluation

## 2016-09-18 ENCOUNTER — Encounter: Payer: Medicaid Other | Admitting: Advanced Practice Midwife

## 2016-09-25 ENCOUNTER — Other Ambulatory Visit: Payer: Self-pay

## 2016-09-25 ENCOUNTER — Telehealth: Payer: Self-pay | Admitting: Neurology

## 2016-09-25 MED ORDER — LAMOTRIGINE 200 MG PO TABS: 200.0000 mg | ORAL_TABLET | Freq: Two times a day (BID) | ORAL | 3 refills | Status: DC

## 2016-09-25 MED ORDER — LEVETIRACETAM 1000 MG PO TABS: 1000.0000 mg | ORAL_TABLET | Freq: Two times a day (BID) | ORAL | 3 refills | Status: DC

## 2016-09-25 NOTE — Telephone Encounter (Signed)
Pls send refills, check if we have the discount cards, also give her number for [facility] [place] [facility] to see if they have resources to help. Thanks

## 2016-09-25 NOTE — Telephone Encounter (Signed)
Contacted patient. She states she is in the process of getting Medicaid. She is out of her Keppra and Lamictal and wants to know if there is a way to get medications at a reduced price. Patient last seen 01/02/16.

## 2016-09-25 NOTE — Telephone Encounter (Signed)
Patient notified. RX sent to pharmacy. Discount cards will be placed up front for her to pick up.

## 2016-09-25 NOTE — Telephone Encounter (Signed)
PT left a voicemail message to have a nurse call her back/Dawn CB# [phone]

## 2016-09-27 ENCOUNTER — Telehealth: Payer: Self-pay

## 2016-10-01 ENCOUNTER — Other Ambulatory Visit: Payer: Self-pay | Admitting: Internal Medicine

## 2016-10-01 DIAGNOSIS — D571 Sickle-cell disease without crisis: Secondary | ICD-10-CM

## 2016-10-01 MED ORDER — OXYCONTIN 30 MG PO T12A: 1.0000 | EXTENDED_RELEASE_TABLET | Freq: Two times a day (BID) | ORAL | 0 refills | Status: DC

## 2016-10-01 MED ORDER — OXYCODONE HCL 10 MG PO TABS: 10.0000 mg | ORAL_TABLET | ORAL | 0 refills | Status: DC | PRN

## 2016-10-01 MED ORDER — PROMETHAZINE HCL 25 MG PO TABS: 25.0000 mg | ORAL_TABLET | Freq: Three times a day (TID) | ORAL | 2 refills | Status: DC | PRN

## 2016-11-08 ENCOUNTER — Ambulatory Visit: Payer: Medicaid Other | Admitting: Family Medicine

## 2016-11-14 ENCOUNTER — Ambulatory Visit: Payer: Medicaid Other | Admitting: Family Medicine

## 2016-11-14 MED FILL — PROMETHAZINE 25 MG TABLET: 25 | 10 days supply | Qty: 30 | Fill #0

## 2016-11-14 MED FILL — oxyCODONE HCL 10 MG TABS: 10 | 15 days supply | Qty: 90 | Fill #0

## 2016-11-15 ENCOUNTER — Telehealth: Payer: Self-pay

## 2016-11-15 NOTE — Telephone Encounter (Signed)
Prior Approval for Medications were done on [place] tracks today 11/15/2016 and Sickle cell caseworker in [place] ([email or web address])  was contacted to approve. Thanks!

## 2016-12-03 MED FILL — OxyCONTIN 30 MG T12A: 30 | 30 days supply | Qty: 60 | Fill #0

## 2016-12-06 MED FILL — EXJADE 500 MG TABLET: 500 | 30 days supply | Qty: 90 | Fill #0

## 2016-12-20 DIAGNOSIS — D571 Sickle-cell disease without crisis: Secondary | ICD-10-CM | POA: Diagnosis not present

## 2016-12-20 DIAGNOSIS — Z8673 Personal history of transient ischemic attack (TIA), and cerebral infarction without residual deficits: Secondary | ICD-10-CM | POA: Diagnosis not present

## 2016-12-24 MED FILL — LIDOCAINE-PRILOCAINE CREAM: 2.5-2.5 | 15 days supply | Qty: 30 | Fill #0

## 2016-12-25 ENCOUNTER — Telehealth: Payer: Self-pay | Admitting: Family Medicine

## 2016-12-25 DIAGNOSIS — D571 Sickle-cell disease without crisis: Secondary | ICD-10-CM

## 2016-12-25 NOTE — Telephone Encounter (Signed)
Patient has a CMET ordered per PCP.

## 2016-12-31 ENCOUNTER — Ambulatory Visit (HOSPITAL_COMMUNITY)
Admission: RE | Admit: 2016-12-31 | Discharge: 2016-12-31 | Disposition: A | Discharge status: Home / Self Care | Payer: Medicaid Other | Source: Ambulatory Visit | Attending: Family Medicine | Admitting: Family Medicine

## 2016-12-31 ENCOUNTER — Other Ambulatory Visit: Payer: Medicaid Other

## 2016-12-31 DIAGNOSIS — D571 Sickle-cell disease without crisis: Secondary | ICD-10-CM | POA: Insufficient documentation

## 2016-12-31 LAB — COMPREHENSIVE METABOLIC PANEL
ALT: 31 U/L — ABNORMAL HIGH (ref 6–29)
AST: 41 U/L — ABNORMAL HIGH (ref 10–30)
Albumin: 4 g/dL (ref 3.6–5.1)
Alkaline Phosphatase: 69 U/L (ref 33–115)
BUN: 8 mg/dL (ref 7–25)
CO2: 23 mmol/L (ref 20–31)
Calcium: 9.2 mg/dL (ref 8.6–10.2)
Chloride: 107 mmol/L (ref 98–110)
Creat: 0.54 mg/dL (ref 0.50–1.10)
Glucose, Bld: 85 mg/dL (ref 65–99)
Potassium: 4.2 mmol/L (ref 3.5–5.3)
Sodium: 139 mmol/L (ref 135–146)
Total Bilirubin: 2 mg/dL — ABNORMAL HIGH (ref 0.2–1.2)
Total Protein: 6.9 g/dL (ref 6.1–8.1)

## 2016-12-31 MED ORDER — HEPARIN SOD (PORK) LOCK FLUSH 100 UNIT/ML IV SOLN
500.0000 [IU] | INTRAVENOUS | Status: AC | PRN
  Administered 2016-12-31: 500 [IU]
  Filled 2016-12-31: qty 5

## 2016-12-31 MED ORDER — SODIUM CHLORIDE 0.9% FLUSH
10.0000 mL | INTRAVENOUS | Status: AC | PRN
  Administered 2016-12-31: 10 mL

## 2016-12-31 NOTE — Procedures (Signed)
[facility]  Procedure Note  Gloriajean DellShanequa Najjar [id number] DOB: Aug 18, 1981 DOA: 12/31/2016   PCP: Nolene Bernheimlu Jegede, MD  Associated Diagnosis: Sickle Cell Anemia without crisis, port-a-cath maintainence  Procedure Note:  Port accessed, flushed, de-accessed.   Condition During Procedure:  Patient stable   Condition at Discharge: denies any discomfort   TATUM, Council Munguia, RN  [facility]

## 2017-01-01 ENCOUNTER — Other Ambulatory Visit: Payer: Medicaid Other

## 2017-01-03 ENCOUNTER — Telehealth: Payer: Self-pay

## 2017-01-03 MED FILL — OxyCONTIN 30 MG T12A: 30 | 30 days supply | Qty: 60 | Fill #0

## 2017-01-03 NOTE — Telephone Encounter (Signed)
Refill request for Oxycodone, oxycontin, and promethazine. Please advise. Thanks!

## 2017-01-06 MED FILL — oxyCODONE HCL 10 MG TABS: 10 | 15 days supply | Qty: 90 | Fill #0

## 2017-01-06 MED FILL — PROMETHAZINE 25 MG TABLET: 25 | 10 days supply | Qty: 30 | Fill #1

## 2017-01-07 ENCOUNTER — Telehealth: Payer: Self-pay | Admitting: *Deleted

## 2017-01-07 NOTE — Telephone Encounter (Signed)
Patient verified DOB Patient is aware of liver enzymes being slightly elevated but improved from prior studies. Patient will have a recheck completed at the next visit. No further questions at this time.

## 2017-01-07 NOTE — Telephone Encounter (Signed)
-----   Message from Quentin Angstlugbemiga E Jegede, MD sent at 01/01/2017  6:40 PM EDT ----- Please inform patient that her lab results showed slightly elevated liver enzymes but stable.

## 2017-01-08 ENCOUNTER — Telehealth: Payer: Self-pay

## 2017-01-08 ENCOUNTER — Other Ambulatory Visit: Payer: Self-pay | Admitting: Family Medicine

## 2017-01-08 DIAGNOSIS — M21372 Foot drop, left foot: Secondary | ICD-10-CM

## 2017-01-08 NOTE — Telephone Encounter (Signed)
Patient needs a new referral to be sent over for orthopedic sports medicine. They will not accept the one from October.

## 2017-01-13 ENCOUNTER — Encounter (INDEPENDENT_AMBULATORY_CARE_PROVIDER_SITE_OTHER): Payer: Self-pay | Admitting: Orthopedic Surgery

## 2017-01-13 ENCOUNTER — Ambulatory Visit (INDEPENDENT_AMBULATORY_CARE_PROVIDER_SITE_OTHER): Payer: Medicaid Other | Admitting: Orthopedic Surgery

## 2017-01-13 VITALS — Ht 64.0 in | Wt 145.0 lb

## 2017-01-13 DIAGNOSIS — Z8673 Personal history of transient ischemic attack (TIA), and cerebral infarction without residual deficits: Secondary | ICD-10-CM | POA: Diagnosis not present

## 2017-01-13 DIAGNOSIS — M21372 Foot drop, left foot: Secondary | ICD-10-CM

## 2017-01-13 DIAGNOSIS — D571 Sickle-cell disease without crisis: Secondary | ICD-10-CM | POA: Diagnosis not present

## 2017-01-13 DIAGNOSIS — R531 Weakness: Secondary | ICD-10-CM | POA: Diagnosis not present

## 2017-01-13 MED FILL — levETIRAcetam 1000 MG TABS: 1000 | 30 days supply | Qty: 60 | Fill #0

## 2017-01-13 MED FILL — lamoTRIgine 200 MG TABS: 200 | 30 days supply | Qty: 60 | Fill #0

## 2017-01-13 MED FILL — EXJADE 500 MG TABLET: 500 | 30 days supply | Qty: 90 | Fill #0

## 2017-01-13 NOTE — Progress Notes (Signed)
Office Visit Note   Patient: Debra Williamson           Date of Birth: 01-May-1981           MRN: [id number] Visit Date: 01/13/2017              Requested by: Massie Maroon, FNP [address] [place], [place] [address] PCP: Joaquin Courts, FNP  Chief Complaint  Patient presents with  . Left Foot - Pain    Left foot drop    HPI: Patient is a 36 year old woman who presents today for evaluation of her left foot. She has been in AFO for foot drop. Is status post stroke. States her current brace is causing skin breakdown and she has callused ulcers medially over her malleolus. States her current AFO she got before she had her last child who is now 56 years old. reQuesting an order for a new AFO.   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 Has tried padding without relief.   Assessment & Plan: Visit Diagnoses:  1. Left-sided weakness   2. Left foot drop   3. H/O: stroke     Plan: Order for new AFO to [facility] provided. She'll follow up in office as needed.  Follow-Up Instructions: Return if symptoms worsen or fail to improve.   Left Ankle Exam  Swelling: none  Tenderness  The patient is experiencing no tenderness.   Other  Scars: present  Comments:  Unable to dorsiflex left foot. Darkness and callus over medial malleolus. No openareas. No drainag.e no erythema.     Physical Exam  Constitutional: Appears well-developed.  Head: Normocephalic.  Eyes: EOM are normal.  Neck: Normal range of motion.  Cardiovascular: Normal rate.   Pulmonary/Chest: Effort normal.  Neurological: Is alert.  Skin: Skin is warm.  Psychiatric: Has a normal mood and affect.   Imaging: No results found.  Labs: Lab Results  Component Value Date   HGBA1C <4.2 (L) 09/07/2016   REPTSTATUS 08/13/2015 FINAL 08/08/2015   CULT  08/08/2015    NO GROWTH 5 DAYS Performed at [facility]     Orders:  No orders of the defined types were placed in this encounter.  No  orders of the defined types were placed in this encounter.    Procedures: No procedures performed  Clinical Data: No additional findings.  ROS: Review of Systems  Constitutional: Negative for chills and fever.  Musculoskeletal: Positive for gait problem.  Skin: Negative for wound.    Objective: Vital Signs: Ht 5\' 4"  (1.626 m)   Wt 145 lb (65.8 kg)   BMI 24.89 kg/m   Specialty Comments:  No specialty comments available.  PMFS History: Patient Active Problem List   Diagnosis Date Noted  . TIA (transient ischemic attack)   . Vertigo 09/07/2016  . Dyspnea 09/07/2016  . Lightheadedness 09/06/2016  . Healthcare maintenance 07/30/2016  . Dizziness, nonspecific 07/30/2016  . Left foot drop 07/30/2016  . Left-sided weakness 07/30/2016  . 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 History of stroke associated with blood clotting tendency 01/02/2016  . [place] disease (HCC) 10/27/2015  . Hb-SS disease with acute chest syndrome (HCC) 08/14/2015  . Acute chest syndrome (HCC)   . Acute respiratory failure with hypoxemia (HCC)   . Community acquired pneumonia 08/08/2015  . Hemiparesis following cerebrovascular accident (CVA) (HCC) 05/30/2015  . Ankle gives out 05/30/2015  .  Seizure disorder (HCC) 05/30/2015  . 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 H/O: stroke 03/12/2015  . Seizures (HCC) 03/12/2015  . Sickle cell disease (HCC) 03/11/2015  . Sickle cell crisis (HCC) 03/11/2015  . Symptomatic anemia 03/11/2015   Past Medical History:  Diagnosis Date  . Anemia   . Blood transfusion without reported diagnosis   . Seizures (HCC)    one time incident, may have been r/t to a pain medication she was prescribed  . Sickle cell disease (HCC)   . Stroke [facility](HCC)     Family History  Problem Relation Age of Onset  . HIV/AIDS Father     Past Surgical  History:  Procedure Laterality Date  . CESAREAN SECTION     x4  . CHOLECYSTECTOMY    . PORTACATH PLACEMENT Right w-3  . reverse tubal ligation    . TEE WITHOUT CARDIOVERSION N/A 09/10/2016   Procedure: TRANSESOPHAGEAL ECHOCARDIOGRAM (TEE);  Surgeon: Thurmon FairMihai Croitoru, MD;  [facility];  Service: Cardiovascular;  Laterality: N/A;   Social History   Occupational History  . Doesn't work    Social History Main Topics  . Smoking status: Never Smoker  . Smokeless tobacco: Never Used  . Alcohol use No     Comment: occasionally  . Drug use: No  . Sexual activity: Yes

## 2017-01-14 ENCOUNTER — Other Ambulatory Visit: Payer: Self-pay | Admitting: Internal Medicine

## 2017-01-14 DIAGNOSIS — D571 Sickle-cell disease without crisis: Secondary | ICD-10-CM

## 2017-01-14 MED ORDER — PROMETHAZINE HCL 25 MG PO TABS: 25.0000 mg | ORAL_TABLET | Freq: Three times a day (TID) | ORAL | 2 refills | Status: DC | PRN

## 2017-01-14 MED ORDER — OXYCONTIN 30 MG PO T12A: 1.0000 | EXTENDED_RELEASE_TABLET | Freq: Two times a day (BID) | ORAL | 0 refills | Status: DC

## 2017-01-14 MED ORDER — OXYCODONE HCL 10 MG PO TABS: 10.0000 mg | ORAL_TABLET | ORAL | 0 refills | Status: DC | PRN

## 2017-02-03 ENCOUNTER — Telehealth: Payer: Self-pay

## 2017-02-07 ENCOUNTER — Encounter: Payer: Self-pay | Admitting: Family Medicine

## 2017-02-07 ENCOUNTER — Ambulatory Visit (INDEPENDENT_AMBULATORY_CARE_PROVIDER_SITE_OTHER): Payer: Medicaid Other | Admitting: Family Medicine

## 2017-02-07 VITALS — BP 114/60 | HR 82 | Temp 98.7°F | Resp 16 | Ht 64.0 in | Wt 158.0 lb

## 2017-02-07 DIAGNOSIS — R0600 Dyspnea, unspecified: Secondary | ICD-10-CM | POA: Diagnosis not present

## 2017-02-07 DIAGNOSIS — Z23 Encounter for immunization: Secondary | ICD-10-CM

## 2017-02-07 DIAGNOSIS — D5700 Hb-SS disease with crisis, unspecified: Secondary | ICD-10-CM | POA: Diagnosis not present

## 2017-02-07 DIAGNOSIS — G894 Chronic pain syndrome: Secondary | ICD-10-CM | POA: Diagnosis not present

## 2017-02-07 LAB — COMPLETE METABOLIC PANEL WITH GFR
ALT: 56 U/L — ABNORMAL HIGH (ref 6–29)
AST: 72 U/L — ABNORMAL HIGH (ref 10–30)
Albumin: 4.6 g/dL (ref 3.6–5.1)
Alkaline Phosphatase: 70 U/L (ref 33–115)
BUN: 9 mg/dL (ref 7–25)
CO2: 22 mmol/L (ref 20–31)
Calcium: 9.5 mg/dL (ref 8.6–10.2)
Chloride: 105 mmol/L (ref 98–110)
Creat: 0.62 mg/dL (ref 0.50–1.10)
GFR, Est African American: >89 mL/min (ref >=60)
GFR, Est Non African American: >89 mL/min (ref >=60)
Glucose, Bld: 78 mg/dL (ref 65–99)
Potassium: 4.4 mmol/L (ref 3.5–5.3)
Sodium: 139 mmol/L (ref 135–146)
Total Bilirubin: 2 mg/dL — ABNORMAL HIGH (ref 0.2–1.2)
Total Protein: 7.8 g/dL (ref 6.1–8.1)

## 2017-02-07 LAB — RETICULOCYTES
ABS Retic: 385140 cells/uL — ABNORMAL HIGH (ref 20000–80000)
RBC.: 2.62 MIL/uL — ABNORMAL LOW (ref 3.80–5.10)
Retic Ct Pct: 14.7 %

## 2017-02-07 LAB — POCT URINALYSIS DIP (DEVICE)
Bilirubin Urine: NEGATIVE
Glucose, UA: NEGATIVE mg/dL
Hgb urine dipstick: NEGATIVE
Ketones, ur: NEGATIVE mg/dL
Leukocytes, UA: NEGATIVE
Nitrite: NEGATIVE
Protein, ur: NEGATIVE mg/dL
Specific Gravity, Urine: 1.01 (ref 1.005–1.030)
Urobilinogen, UA: 1 mg/dL (ref 0.0–1.0)
pH: 5.5 (ref 5.0–8.0)

## 2017-02-07 LAB — CBC WITH DIFFERENTIAL/PLATELET
Basophils Absolute: 110 cells/uL (ref 0–200)
Basophils Relative: 1 %
Eosinophils Absolute: 330 cells/uL (ref 15–500)
Eosinophils Relative: 3 %
HCT: 23.8 % — ABNORMAL LOW (ref 35.0–45.0)
Hemoglobin: 7.7 g/dL — ABNORMAL LOW (ref 11.7–15.5)
Lymphocytes Relative: 28 %
Lymphs Abs: 3080 cells/uL (ref 850–3900)
MCH: 29.4 pg (ref 27.0–33.0)
MCHC: 32.4 g/dL (ref 32.0–36.0)
MCV: 90.8 fL (ref 80.0–100.0)
MPV: 9.9 fL (ref 7.5–12.5)
Monocytes Absolute: 1430 cells/uL — ABNORMAL HIGH (ref 200–950)
Monocytes Relative: 13 %
Neutro Abs: 6050 cells/uL (ref 1500–7800)
Neutrophils Relative %: 55 %
Platelets: 611 10*3/uL — ABNORMAL HIGH (ref 140–400)
RBC: 2.62 MIL/uL — ABNORMAL LOW (ref 3.80–5.10)
RDW: 19.5 % — ABNORMAL HIGH (ref 11.0–15.0)
WBC: 11 10*3/uL — ABNORMAL HIGH (ref 3.8–10.8)

## 2017-02-07 MED ORDER — PROMETHAZINE HCL 25 MG PO TABS: 25.0000 mg | ORAL_TABLET | Freq: Three times a day (TID) | ORAL | 2 refills | Status: DC | PRN

## 2017-02-07 MED ORDER — ALBUTEROL SULFATE HFA 108 (90 BASE) MCG/ACT IN AERS: 2.0000 | INHALATION_SPRAY | RESPIRATORY_TRACT | 1 refills | Status: DC | PRN

## 2017-02-07 MED FILL — PROAIR HFA 90 MCG INHALER: 108 (90 BAS | 13 days supply | Qty: 9 | Fill #0

## 2017-02-07 MED FILL — OxyCONTIN 30 MG T12A: 30 | 30 days supply | Qty: 60 | Fill #0

## 2017-02-07 MED FILL — oxyCODONE HCL 10 MG TABS: 10 | 15 days supply | Qty: 90 | Fill #0

## 2017-02-07 MED FILL — PROMETHAZINE 25 MG TABLET: 25 | 30 days supply | Qty: 30 | Fill #0

## 2017-02-07 NOTE — Patient Instructions (Signed)
Sickle Cell Anemia, Adult °Sickle cell anemia is a condition where your red blood cells are shaped like sickles. Red blood cells carry oxygen through the body. Sickle-shaped red blood cells do not live as long as normal red blood cells. They also clump together and block blood from flowing through the blood vessels. These things prevent the body from getting enough oxygen. Sickle cell anemia causes organ damage and pain. It also increases the risk of infection. °Follow these instructions at home: °· Drink enough fluid to keep your pee (urine) clear or pale yellow. Drink more in hot weather and during exercise. °· Do not smoke. Smoking lowers oxygen levels in the blood. °· Only take over-the-counter or prescription medicines as told by your doctor. °· Take antibiotic medicines as told by your doctor. Make sure you finish them even if you start to feel better. °· Take supplements as told by your doctor. °· Consider wearing a medical alert bracelet. This tells anyone caring for you in an emergency of your condition. °· When traveling, keep your medical information, doctors' names, and the medicines you take with you at all times. °· If you have a fever, do not take fever medicines right away. This could cover up a problem. Tell your doctor. °· Keep all follow-up visits with your doctor. Sickle cell anemia requires regular medical care. °Contact a doctor if: °You have a fever. °Get help right away if: °· You feel dizzy or faint. °· You have new belly (abdominal) pain, especially on the left side near the stomach area. °· You have a lasting, often uncomfortable and painful erection of the penis (priapism). If it is not treated right away, you will become unable to have sex (impotence). °· You have numbness in your arms or legs or you have a hard time moving them. °· You have a hard time talking. °· You have a fever or lasting symptoms for more than 2-3 days. °· You have a fever and your symptoms suddenly get  worse. °· You have signs or symptoms of infection. These include: °? Chills. °? Being more tired than normal (lethargy). °? Irritability. °? Poor eating. °? Throwing up (vomiting). °· You have pain that is not helped with medicine. °· You have shortness of breath. °· You have pain in your chest. °· You are coughing up pus-like or bloody mucus. °· You have a stiff neck. °· Your feet or hands swell or have pain. °· Your belly looks bloated. °· Your joints hurt. °This information is not intended to replace advice given to you by your health care provider. Make sure you discuss any questions you have with your health care provider. °Document Released: 07/28/2013 Document Revised: 03/14/2016 Document Reviewed: 05/19/2013 °Elsevier Interactive Patient Education © 2017 [employer] Inc. ° °

## 2017-02-07 NOTE — Progress Notes (Signed)
Patient ID: Debra Williamson, female    DOB: 1981/05/02, 36 y.o.   MRN: [id number]  PCP: Joaquin Courts, FNP  Chief Complaint  Patient presents with  . Follow-up    SCC  . Medication Refill    oxycontin,promethazine    Subjective:  HPI  Debra Williamson is a 36 y.o. female presents for a routine chronic condition follow-up and medication refill. 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 She is followed by [facility], Dr. Zettie Cooley, MD. for hematology. Due to excessive iron overload and for CVA prevention, Debra Williamson is undergoing chronic apheresis , monthly at [facility].  Iron overload is managed with chelation therapy with EXJADE. Other specialty involved in care include, Dr. Lajoyce Corners, at [facility] recently evaluated patient for chronic left foot pain and foot drop. She reports improved of symptoms and is currently wearing a brace for her foot. Reports chronic sickle cell related pain is mostly bilaterally in shoulders. Pain is chronic and persistent and today intensity is at  5/10. She denies chest pain. Blurriness of vision is a chronic issue and she wears corrective lenses which improves acuity. Reports chronic shortness of breath which she reports significant improvement compared to last year. Today she denies chest pain, abdominal pain, dysuria, nausea, vomiting, headache, or dizziness.   Social History   Social History  . Marital status: Married    Spouse name: N/A  . Number of children: 4  . Years of education: N/A   Occupational History  . Doesn't work    Social History Main Topics  . Smoking status: Never Smoker  . Smokeless tobacco: Never Used  . Alcohol use No     Comment: occasionally  . Drug use: No  . Sexual activity: Yes   Other Topics Concern  . Not on file   Social History Narrative  . No narrative on file    Family History   Problem Relation Age of Onset  . HIV/AIDS Father    Review of Systems  See HPI  Patient Active Problem List   Diagnosis Date Noted  . TIA (transient ischemic attack)   . Vertigo 09/07/2016  . Dyspnea 09/07/2016  . Lightheadedness 09/06/2016  . Healthcare maintenance 07/30/2016  . Dizziness, nonspecific 07/30/2016  . Left foot drop 07/30/2016  . Left-sided weakness 07/30/2016  . 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 History of stroke associated with blood clotting tendency 01/02/2016  .  disease (HCC) 10/27/2015  . Hb-SS disease with acute chest syndrome (HCC) 08/14/2015  . Acute chest syndrome (HCC)   . Acute respiratory failure with hypoxemia (HCC)   . Community acquired pneumonia 08/08/2015  . Hemiparesis following cerebrovascular accident (CVA) (HCC) 05/30/2015  . Ankle gives out 05/30/2015  . Seizure disorder (HCC) 05/30/2015  . 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 H/O: stroke 03/12/2015  . Seizures (HCC) 03/12/2015  . Sickle cell disease (HCC) 03/11/2015  . Sickle cell crisis (HCC) 03/11/2015  . Symptomatic anemia 03/11/2015    Allergies  Allergen Reactions  . Ibuprofen Hives  . Zofran [Ondansetron Hcl] Hives and Itching  . Demerol [Meperidine] Other (See Comments)    Pt has seizures  . Fentanyl And Related Nausea And Vomiting and Other (See Comments)    Very sedated  **Not allergic to the injection** JUST ALLERGIC TO PATCH  . Other  Itching    Greek Yogurt  . Codeine Hives and Itching  . Hydrocodone Hives and Itching    Prior to Admission medications   Medication Sig Start Date End Date Taking? Authorizing Provider  Ascorbic Acid (VITAMIN C) 1000 MG tablet Take 1,000 mg by mouth every evening.   Yes Historical Provider, MD  aspirin EC 81 MG tablet Take 1 tablet by mouth every evening.  11/03/13  Yes  Historical Provider, MD  diclofenac sodium (VOLTAREN) 1 % GEL Apply 2 g topically 4 (four) times daily. 07/30/16  Yes Quentin Angst, MD  folic acid (FOLVITE) 1 MG tablet Take 1 mg by mouth daily.  02/13/15  Yes Historical Provider, MD  lamoTRIgine (LAMICTAL) 200 MG tablet Take 1 tablet (200 mg total) by mouth 2 (two) times daily. 09/25/16  Yes Van Clines, MD  levETIRAcetam (KEPPRA) 1000 MG tablet Take 1 tablet (1,000 mg total) by mouth 2 (two) times daily. 09/25/16  Yes Van Clines, MD  zolpidem (AMBIEN) 5 MG tablet Take 1 tablet (5 mg total) by mouth at bedtime as needed. sleep 03/13/15  Yes Altha Harm, MD  OXYCONTIN 30 MG 12 hr tablet  01/03/17   Historical Provider, MD  OXYCONTIN 30 MG 12 hr tablet Take 1 tablet by mouth 2 (two) times daily. Patient not taking: Reported on 02/07/2017 01/14/17 02/13/17  Quentin Angst, MD  promethazine (PHENERGAN) 25 MG tablet Take 1 tablet (25 mg total) by mouth every 8 (eight) hours as needed for nausea or vomiting. 1 tablet every 8 hours prn Patient not taking: Reported on 02/07/2017 01/14/17   Quentin Angst, MD    Past Medical, Surgical Family and Social History reviewed and updated.    Objective:   Today's Vitals   02/07/17 0918  BP: 114/60  Pulse: 82  Resp: 16  Temp: 98.7 F (37.1 C)  TempSrc: Oral  SpO2: 96%  Weight: 158 lb (71.7 kg)  Height:  (1.626 m)    Wt Readings from Last 3 Encounters:  02/07/17 158 lb (71.7 kg)  01/13/17 145 lb (65.8 kg)  09/07/16 145 lb 6.4 oz (66 kg)    Physical Exam  Constitutional: She is oriented to person, place, and time. She appears well-developed and well-nourished.  HENT:  Head: Normocephalic and atraumatic.  Eyes: Conjunctivae and EOM are normal. Pupils are equal, round, and reactive to light. No scleral icterus.  Neck: Normal range of motion. Neck supple.  Cardiovascular: Normal rate, regular rhythm, normal heart sounds and intact distal pulses.   Pulmonary/Chest:  Effort normal and breath sounds normal.  Abdominal: Soft. Bowel sounds are normal. She exhibits no distension. There is no tenderness. There is no rebound.  Musculoskeletal: Normal range of motion.  Neurological: She is alert and oriented to person, place, and time.  Skin: Skin is warm and dry.  Psychiatric: She has a normal mood and affect. Her behavior is normal. Judgment and thought content normal.      Assessment & Plan:  1. Hb-SS disease with crisis (HCC) - Iron, TIBC and Ferritin Panel - COMPLETE METABOLIC PANEL WITH GFR - Reticulocytes - CBC with Differential  2. Chronic pain syndrome OxyContin 30 mg, 1 tablet, every 12 hours as needed for pain.  According to the [place] [facility] program, we have reviewed details related to analgesia, adverse effects, aberrant behaviors. Reviewed  Substance Reporting system prior to prescribing opiate medication, no inconsistencies noted.   3. Dyspnea, unspecified type, stable  -Last echo,  08/2016,  indicated mild to moderate increase in pressure. Will not repeat echo today as patient reports dyspnea is improving. Will continue to monitor for worsening signs of Pulmonary Hypertension. -For acute shortness of breath, use albuterol, 2 puffs, every 4-6 hours as needed for shortness of breath  -Keep follow-up appointments scheduled at [facility]. RTC: 3 months-We will perform your PAP smear at this visit.  The patient was given clear instructions to go to ER or return to medical center if symptoms do not improve, worsen or new problems develop. The patient verbalized understanding.  Godfrey Pick. Tiburcio Pea, MSN, [facility] [address] [place], [place] [address] [phone]

## 2017-02-08 LAB — IRON,TIBC AND FERRITIN PANEL
%SAT: 86 % — ABNORMAL HIGH (ref 11–50)
Ferritin: 4651 ng/mL — ABNORMAL HIGH (ref 10–154)
Iron: 177 ug/dL (ref 40–190)
TIBC: 205 ug/dL — ABNORMAL LOW (ref 250–450)

## 2017-02-10 ENCOUNTER — Encounter: Payer: Self-pay | Admitting: Family Medicine

## 2017-02-17 MED FILL — EXJADE 500 MG TABLET: 500 | 30 days supply | Qty: 90 | Fill #0

## 2017-02-27 ENCOUNTER — Telehealth (INDEPENDENT_AMBULATORY_CARE_PROVIDER_SITE_OTHER): Payer: Self-pay | Admitting: Orthopedic Surgery

## 2017-02-27 NOTE — Telephone Encounter (Signed)
[facility]13/[facility] [phone]

## 2017-03-06 ENCOUNTER — Telehealth: Payer: Self-pay

## 2017-03-06 DIAGNOSIS — R0600 Dyspnea, unspecified: Secondary | ICD-10-CM

## 2017-03-07 MED ORDER — OXYCODONE HCL ER 30 MG PO T12A: 30.0000 mg | EXTENDED_RELEASE_TABLET | Freq: Two times a day (BID) | ORAL | 0 refills | Status: DC

## 2017-03-07 MED ORDER — PROMETHAZINE HCL 25 MG PO TABS: 25.0000 mg | ORAL_TABLET | Freq: Three times a day (TID) | ORAL | 2 refills | Status: DC | PRN

## 2017-03-07 MED ORDER — OXYCODONE HCL 10 MG PO TABS: 10.0000 mg | ORAL_TABLET | ORAL | 0 refills | Status: AC | PRN

## 2017-03-07 MED ORDER — OXYCODONE HCL 10 MG PO TABS
10.0000 mg | ORAL_TABLET | ORAL | 0 refills | Status: DC | PRN
Start: 2017-03-07 — End: 2017-03-07

## 2017-03-07 MED FILL — levETIRAcetam 1000 MG TABS: 1000 | 30 days supply | Qty: 60 | Fill #1

## 2017-03-07 MED FILL — lamoTRIgine 200 MG TABS: 200 | 30 days supply | Qty: 60 | Fill #1

## 2017-03-07 NOTE — Telephone Encounter (Signed)
Prescriptions are ready for pickup with a fill date of 03/09/2017. Also please obtain a new updated pain contract from patient and she is to schedule her next follow-up  6/18 or 6/19 and cancel July appointment.

## 2017-03-10 MED FILL — oxyCODONE HCL 10 MG TABS: 10 | 15 days supply | Qty: 90 | Fill #0

## 2017-03-10 MED FILL — OxyCONTIN 30 MG T12A: 30 | 15 days supply | Qty: 30 | Fill #0

## 2017-03-20 MED FILL — EXJADE 500 MG TABLET: 500 | 30 days supply | Qty: 90 | Fill #0

## 2017-04-07 ENCOUNTER — Telehealth: Payer: Self-pay

## 2017-04-07 DIAGNOSIS — R0600 Dyspnea, unspecified: Secondary | ICD-10-CM

## 2017-04-07 MED ORDER — OXYCODONE HCL 10 MG PO TABS: 10.0000 mg | ORAL_TABLET | ORAL | 0 refills | Status: DC | PRN

## 2017-04-07 MED ORDER — PROMETHAZINE HCL 25 MG PO TABS: 25.0000 mg | ORAL_TABLET | Freq: Three times a day (TID) | ORAL | 2 refills | Status: DC | PRN

## 2017-04-07 MED ORDER — OXYCODONE HCL ER 30 MG PO T12A: 30.0000 mg | EXTENDED_RELEASE_TABLET | Freq: Two times a day (BID) | ORAL | 0 refills | Status: DC

## 2017-04-07 NOTE — Telephone Encounter (Signed)
E-prescribed phenergan, oxycodone, and  oxycontin

## 2017-04-08 ENCOUNTER — Ambulatory Visit: Payer: Medicaid Other | Admitting: Neurology

## 2017-04-08 MED FILL — VIT D2 1.25 MG (50,000 UNIT: 1.25 MG | 28 days supply | Qty: 4 | Fill #0

## 2017-04-08 MED FILL — oxyCODONE HCL 10 MG TABS: 10 | 15 days supply | Qty: 90 | Fill #0

## 2017-04-14 MED FILL — OxyCONTIN 30 MG T12A: 30 | 15 days supply | Qty: 30 | Fill #0

## 2017-04-22 MED FILL — EXJADE 500 MG TABLET: 500 | 30 days supply | Qty: 90 | Fill #1

## 2017-05-07 ENCOUNTER — Ambulatory Visit: Payer: Medicaid Other | Admitting: Family Medicine

## 2017-05-08 ENCOUNTER — Ambulatory Visit: Payer: Medicaid Other | Admitting: Family Medicine

## 2017-05-08 ENCOUNTER — Ambulatory Visit: Payer: Medicaid Other | Admitting: Neurology

## 2017-05-08 MED FILL — LIDOCAINE-PRILOCAINE CREAM: 2.5-2.5 | 15 days supply | Qty: 30 | Fill #1

## 2017-05-09 ENCOUNTER — Ambulatory Visit: Payer: Medicaid Other | Admitting: Family Medicine

## 2017-05-13 ENCOUNTER — Encounter: Payer: Self-pay | Admitting: Family Medicine

## 2017-05-13 ENCOUNTER — Telehealth: Payer: Self-pay

## 2017-05-13 ENCOUNTER — Ambulatory Visit (INDEPENDENT_AMBULATORY_CARE_PROVIDER_SITE_OTHER): Payer: Medicaid Other | Admitting: Family Medicine

## 2017-05-13 ENCOUNTER — Other Ambulatory Visit (HOSPITAL_COMMUNITY)
Admission: RE | Admit: 2017-05-13 | Discharge: 2017-05-13 | Disposition: A | Discharge status: Home / Self Care | Payer: Medicaid Other | Source: Ambulatory Visit | Attending: Internal Medicine | Admitting: Internal Medicine

## 2017-05-13 VITALS — BP 104/66 | HR 72 | Temp 97.8°F | Resp 14 | Ht 64.0 in | Wt 160.2 lb

## 2017-05-13 DIAGNOSIS — R110 Nausea: Secondary | ICD-10-CM

## 2017-05-13 DIAGNOSIS — G894 Chronic pain syndrome: Secondary | ICD-10-CM

## 2017-05-13 DIAGNOSIS — Z01419 Encounter for gynecological examination (general) (routine) without abnormal findings: Secondary | ICD-10-CM

## 2017-05-13 DIAGNOSIS — D571 Sickle-cell disease without crisis: Secondary | ICD-10-CM

## 2017-05-13 DIAGNOSIS — H60502 Unspecified acute noninfective otitis externa, left ear: Secondary | ICD-10-CM

## 2017-05-13 MED ORDER — NEOMYCIN-POLYMYXIN-HC 3.5-10000-1 OT SOLN: 3.0000 [drp] | Freq: Three times a day (TID) | OTIC | 0 refills | Status: DC

## 2017-05-13 MED ORDER — LEVETIRACETAM 1000 MG PO TABS: 1000.0000 mg | ORAL_TABLET | Freq: Two times a day (BID) | ORAL | 3 refills | Status: DC

## 2017-05-13 MED ORDER — OXYCODONE HCL 10 MG PO TABS: 10.0000 mg | ORAL_TABLET | ORAL | 0 refills | Status: AC | PRN

## 2017-05-13 MED ORDER — OMEPRAZOLE 40 MG PO CPDR: 40.0000 mg | DELAYED_RELEASE_CAPSULE | Freq: Every day | ORAL | 3 refills | Status: DC

## 2017-05-13 MED ORDER — PROMETHAZINE HCL 25 MG PO TABS: 25.0000 mg | ORAL_TABLET | Freq: Three times a day (TID) | ORAL | 2 refills | Status: DC | PRN

## 2017-05-13 MED ORDER — LAMOTRIGINE 200 MG PO TABS: 200.0000 mg | ORAL_TABLET | Freq: Two times a day (BID) | ORAL | 3 refills | Status: DC

## 2017-05-13 MED ORDER — OXYCODONE HCL ER 30 MG PO T12A: 30.0000 mg | EXTENDED_RELEASE_TABLET | Freq: Two times a day (BID) | ORAL | 0 refills | Status: DC

## 2017-05-13 MED FILL — levETIRAcetam 1000 MG TABS: 1000 | 30 days supply | Qty: 60 | Fill #0

## 2017-05-13 MED FILL — OMEPRAZOLE DR 40 MG CAPSULE: 40 | 30 days supply | Qty: 30 | Fill #0

## 2017-05-13 MED FILL — OxyCONTIN 30 MG T12A: 30 | 30 days supply | Qty: 60 | Fill #0

## 2017-05-13 MED FILL — NEO/POLYMYXIN/HC EAR SOLN: 3.5-10000-1 | 22 days supply | Qty: 10 | Fill #0

## 2017-05-13 MED FILL — oxyCODONE HCL 10 MG TABS: 10 | 15 days supply | Qty: 90 | Fill #0

## 2017-05-13 MED FILL — lamoTRIgine 200 MG TABS: 200 | 30 days supply | Qty: 60 | Fill #0

## 2017-05-13 NOTE — Progress Notes (Signed)
Patient ID: Debra Williamson, female    DOB: 06/19/1981, 36 y.o.   MRN: [id number]  PCP: Bing Neighbors, FNP  Chief Complaint  Patient presents with  . Follow-up    3 Month    Subjective:  HPI [name] is a 36 y.o. female presents for sickle cell disease follow-up, medication management, and gynecological exam.  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   Sickle Cell Anemia Suellen is followed by [facility], Dr. Lyla Glassing. She recently had an echocardiogram 05/01/2017 which show normal EF 55-60&, mildly dilated left atrium, with normal right ventricular systolic function. Ona also suffers from hemachromatosis r/t iron overload received RBC exchanges at [facility] monthly in addition to taking ExJade. She continues to report good control of sickle cell pain. Her pain is typically limited to her joints and she has days in which she is completely pain free.    Gynecological Exam  [name] is unaware of when she had her last PAP. Today she denies dysuria, vaginal discharge, or vaginal pain. She was recently started on depo-provera to stop her menstrual cycle and for contraception. Her next injection is due in September.  Left Ear Pain  [name] reports 1 week history of left ear pain. She denies a history of ear infections. She denies any associated nasal congestion, headache, or facial pain. Characterizes pain as aching. She has not experienced any left ear drainage.  Social History   Social History  . Marital status: Married    Spouse name: N/A  . Number of children: 4  . Years of education: N/A   Occupational History  . Doesn't work    Social History Main Topics  . Smoking status: Never Smoker  . Smokeless tobacco: Never Used  . Alcohol use No     Comment: occasionally  . Drug use: No  . Sexual activity: Yes   Other Topics  Concern  . Not on file   Social History Narrative  . No narrative on file    Family History  Problem Relation Age of Onset  . HIV/AIDS Father    Review of Systems See HPI Patient Active Problem List   Diagnosis Date Noted  . TIA (transient ischemic attack)   . Vertigo 09/07/2016  . Dyspnea 09/07/2016  . Lightheadedness 09/06/2016  . Healthcare maintenance 07/30/2016  . Dizziness, nonspecific 07/30/2016  . Left foot drop 07/30/2016  . Left-sided weakness 07/30/2016  . 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 History of stroke associated with blood clotting tendency 01/02/2016  . [place] disease (HCC) 10/27/2015  . Hb-SS disease with acute chest syndrome (HCC) 08/14/2015  . Acute chest syndrome (HCC)   . Acute respiratory failure with hypoxemia (HCC)   . Community acquired pneumonia 08/08/2015  . Hemiparesis following cerebrovascular accident (CVA) (HCC) 05/30/2015  . Ankle gives out 05/30/2015  . Seizure disorder (HCC) 05/30/2015  . 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 H/O: stroke 03/12/2015  . Seizures (HCC) 03/12/2015  . Sickle cell disease (HCC) 03/11/2015  . Sickle cell crisis (HCC) 03/11/2015  . Symptomatic anemia 03/11/2015    Allergies  Allergen Reactions  . Ibuprofen Hives  . Zofran [Ondansetron Hcl] Hives and Itching  . Demerol [Meperidine] Other (See Comments)    Pt has seizures  . Fentanyl And Related Nausea And Vomiting and Other (See  Comments)    Very sedated  **Not allergic to the injection** JUST ALLERGIC TO PATCH  . Other Itching    Greek Yogurt  . Codeine Hives and Itching  . Hydrocodone Hives and Itching    Prior to Admission medications   Medication Sig Start Date End Date Taking? Authorizing Provider  albuterol (PROVENTIL HFA;VENTOLIN HFA) 108 (90 Base) MCG/ACT inhaler Inhale 2 puffs into the lungs  every 4 (four) hours as needed for wheezing or shortness of breath (cough, shortness of breath or wheezing.). 02/07/17  Yes Bing Neighbors, FNP  Ascorbic Acid (VITAMIN C) 1000 MG tablet Take 1,000 mg by mouth every evening.   Yes [provider]  aspirin EC 81 MG tablet Take 1 tablet by mouth every evening.  11/03/13  Yes [provider]  diclofenac sodium (VOLTAREN) 1 % GEL Apply 2 g topically 4 (four) times daily. 07/30/16  Yes Quentin Angst, MD  folic acid (FOLVITE) 1 MG tablet Take 1 mg by mouth daily.  02/13/15  Yes [provider]  lamoTRIgine (LAMICTAL) 200 MG tablet Take 1 tablet (200 mg total) by mouth 2 (two) times daily. 09/25/16  Yes Van Clines, MD  levETIRAcetam (KEPPRA) 1000 MG tablet Take 1 tablet (1,000 mg total) by mouth 2 (two) times daily. 09/25/16  Yes Van Clines, MD  oxyCODONE (OXYCONTIN) 30 MG 12 hr tablet Take 30 mg by mouth every 12 (twelve) hours. No fill before 04/10/17 04/07/17  Yes Bing Neighbors, FNP  Oxycodone HCl 10 MG TABS Take 1 tablet (10 mg total) by mouth every 4 (four) hours as needed. 04/07/17  Yes Bing Neighbors, FNP  promethazine (PHENERGAN) 25 MG tablet Take 1 tablet (25 mg total) by mouth every 8 (eight) hours as needed for nausea or vomiting. 1 tablet every 8 hours prn 04/07/17  Yes Bing Neighbors, FNP  zolpidem (AMBIEN) 5 MG tablet Take 1 tablet (5 mg total) by mouth at bedtime as needed. sleep Patient not taking: Reported on 05/13/2017 03/13/15   Altha Harm, MD    Past Medical, Surgical Family and Social History reviewed and updated.    Objective:   Today's Vitals   05/13/17 0814  Weight: 160 lb 3.2 oz (72.7 kg)  Height: 5\' 4"  (1.626 m)    Wt Readings from Last 3 Encounters:  05/13/17 160 lb 3.2 oz (72.7 kg)  02/07/17 158 lb (71.7 kg)  01/13/17 145 lb (65.8 kg)   Physical Exam  Constitutional: She is oriented to person, place, and time. She appears well-developed and well-nourished.   HENT:  Left Ear: Hearing normal. There is swelling and tenderness. Tympanic membrane is erythematous.  Nose: Nose normal.  Mouth/Throat: Oropharynx is clear and moist.  Eyes: Pupils are equal, round, and reactive to light. Conjunctivae and EOM are normal.  Neck: Normal range of motion. Neck supple. No thyromegaly present.  Cardiovascular: Normal rate, regular rhythm, normal heart sounds and intact distal pulses.   Pulmonary/Chest: Effort normal and breath sounds normal. No respiratory distress. She has no wheezes. She has no rales. She exhibits no tenderness.  Abdominal: Soft. Bowel sounds are normal. She exhibits no distension and no mass. There is no tenderness. There is no rebound and no guarding.  Genitourinary:  Genitourinary Comments: Normal female external genitalia without lesion. No inguinal lymphadenopathy. Vaginal mucosa is pink and moist without lesions. Cervix is without discharge, not friable. Pap smear obtained. No cervical motion tenderness, adnexal fullness or tenderness.  Musculoskeletal: Normal range  of motion.  Neurological: She is alert and oriented to person, place, and time.  Skin: Skin is warm and dry.  Psychiatric: She has a normal mood and affect. Her behavior is normal. Judgment and thought content normal.   Assessment & Plan:  1. Hb-SS disease without crisis (HCC) 2. Chronic Pain Syndrome -[facility]. -Oxycodone 10 mg, 1 tablet, every 4 hours as needed for pain  -OxyContin 30 mg, 1 tablet every 12 hours for pain.  Acute and chronic painful episodes - We agreed on Opiate dose and amount of pills  per month. We discussed that pt is to receive Schedule II prescriptions only from our clinic. Pt is also aware that the prescription history is available to us online through the [facility] CSRS. Controlled substance agreement reviewed and signed. We reminded  .Gloriajean DellShanequa Hurta that all patients receiving Schedule II narcotics must be seen for follow  within one month of prescription being requested. We reviewed the terms of our pain agreement, including the need to keep medicines in a safe locked location away from children or pets, and the need to report excess sedation or constipation, measures to avoid constipation, and policies related to early refills and stolen prescriptions. According to the [place] Chronic Pain Initiative program, we have reviewed details related to analgesia, adverse effects and aberrant behaviors.   2. Gastroesophageal reflux disease, esophagitis presence not specified -Omeprazole 40 mg once daily.  3. Acute otitis externa of left ear, unspecified type -neomycin-polymyxin-hydrocortisone (Cortisiporin) please in left ear 3 drops, 3 times daily for 5-7 days.  4. Gynecologic exam normal - Cytology - PAP [place]  5. Nausea - promethazine (PHENERGAN) 25 MG tablet; Take 1 tablet (25 mg total) by mouth every 8 (eight) hours as needed for nausea or vomiting. 1 tablet every 8 hours prn  Dispense: 30 tablet; Refill: 2  RTC: 1 month for chronic pain management.  Godfrey PickKimberly S. Tiburcio PeaHarris, MSN, FNP-C The Patient Care [facility]  [address] [place], [place] [id number] [phone]

## 2017-05-13 NOTE — Patient Instructions (Signed)
I am prescribing you omeprazole for acid reflux. You will take one tablet twice daily with meals for heartburn and acid reflux symptoms. If your symptoms do not resolve, return for care.    Pap Test Why am I having this test? A pap test is sometimes called a pap smear. It is a screening test that is used to check for signs of cancer of the vagina, cervix, and uterus. The test can also identify the presence of infection or precancerous changes. Your health care provider will likely recommend you have this test done on a regular basis. This test may be done:  Every 3 years, starting at age 28.  Every 5 years, in combination with testing for the presence of human papillomavirus (HPV).  More or less often depending on other medical conditions.  What kind of sample is taken? Using a small cotton swab, plastic spatula, or brush, your health care provider will collect a sample of cells from the surface of your cervix. Your cervix is the opening to your uterus, also called a womb. Secretions from the cervix and vagina may also be collected. How do I prepare for this test?  Be aware of where you are in your menstrual cycle. You may be asked to reschedule the test if you are menstruating on the day of the test.  You may need to reschedule if you have a known vaginal infection on the day of the test.  You may be asked to avoid douching or taking a bath the day before or the day of the test.  Some medicines can cause abnormal test results, such as digitalis and tetracycline. Talk with your health care provider before your test if you take one of these medicines. What do the results mean? Abnormal test results may indicate a number of health conditions. These may include:  Cancer. Although pap test results cannot be used to diagnose cancer of the cervix, vagina, or uterus, they may suggest the possibility of cancer. Further tests would be required to determine if cancer is present.  Sexually  transmitted disease.  Fungal infection.  Parasite infection.  Herpes infection.  A condition causing or contributing to infertility.  It is your responsibility to obtain your test results. Ask the lab or department performing the test when and how you will get your results. Contact your health care provider to discuss any questions you have about your results. Talk with your health care provider to discuss your results, treatment options, and if necessary, the need for more tests. Talk with your health care provider if you have any questions about your results. This information is not intended to replace advice given to you by your health care provider. Make sure you discuss any questions you have with your health care provider. Document Released: 12/28/2002 Document Revised: 06/12/2016 Document Reviewed: 02/28/2014 Elsevier Interactive Patient Education  2018 [employer].  Palpitations A palpitation is the feeling that your heartbeat is irregular or is faster than normal. It may feel like your heart is fluttering or skipping a beat. Palpitations are usually not a serious problem. They may be caused by many things, including smoking, caffeine, alcohol, stress, and certain medicines. Although most causes of palpitations are not serious, palpitations can be a sign of a serious medical problem. In some cases, you may need further medical evaluation. Follow these instructions at home: Pay attention to any changes in your symptoms. Take these actions to help with your condition:  Avoid the following: ? Caffeinated coffee, tea,  soft drinks, diet pills, and energy drinks. ? Chocolate. ? Alcohol.  Do not use any tobacco products, such as cigarettes, chewing tobacco, and e-cigarettes. If you need help quitting, ask your health care provider.  Try to reduce your stress and anxiety. Things that can help you relax include: ? Yoga. ? Meditation. ? Physical activity, such as swimming, jogging, or  walking. ? Biofeedback. This is a method that helps you learn to use your mind to control things in your body, such as your heartbeats.  Get plenty of rest and sleep.  Take over-the-counter and prescription medicines only as told by your health care provider.  Keep all follow-up visits as told by your health care provider. This is important.  Contact a health care provider if:  You continue to have a fast or irregular heartbeat after 24 hours.  Your palpitations occur more often. Get help right away if:  You have chest pain or shortness of breath.  You have a severe headache.  You feel dizzy or you faint. This information is not intended to replace advice given to you by your health care provider. Make sure you discuss any questions you have with your health care provider. Document Released: 10/04/2000 Document Revised: 03/11/2016 Document Reviewed: 06/22/2015 Elsevier Interactive Patient Education  2017 [employer]

## 2017-05-14 ENCOUNTER — Encounter: Payer: Self-pay | Admitting: Family Medicine

## 2017-05-14 LAB — CYTOLOGY - PAP: Diagnosis: NEGATIVE

## 2017-05-16 MED FILL — VIT D2 1.25 MG (50,000 UNIT: 1.25 MG | 28 days supply | Qty: 4 | Fill #1

## 2017-05-20 MED FILL — EXJADE 500 MG TABLET: 500 | 30 days supply | Qty: 90 | Fill #2

## 2017-05-27 ENCOUNTER — Telehealth: Payer: Self-pay

## 2017-05-27 DIAGNOSIS — D571 Sickle-cell disease without crisis: Secondary | ICD-10-CM

## 2017-05-27 NOTE — Telephone Encounter (Signed)
Patient called stating that she has been bleeding for 9 days started the Depo. I explained to patient that she will have some bleeding until she gets her 3rd injection. I explained to her that she may have some spotting to light bleeding when she gets her 3rd injection. Patient had no questions and verbalized understanding.

## 2017-05-27 NOTE — Telephone Encounter (Signed)
Patient was advised that it's normal to bleed when you have your first depo shot but to come in on Thursday is she is still actively bleeding

## 2017-06-09 ENCOUNTER — Telehealth: Payer: Self-pay

## 2017-06-09 DIAGNOSIS — Z79891 Long term (current) use of opiate analgesic: Secondary | ICD-10-CM

## 2017-06-09 MED ORDER — OXYCODONE HCL 10 MG PO TABS: 10.0000 mg | ORAL_TABLET | ORAL | 0 refills | Status: AC | PRN

## 2017-06-09 MED ORDER — OXYCODONE HCL ER 30 MG PO T12A: 30.0000 mg | EXTENDED_RELEASE_TABLET | Freq: Two times a day (BID) | ORAL | 0 refills | Status: DC

## 2017-06-09 NOTE — Telephone Encounter (Signed)
Oxycontin # 60 and Oxycodone # 90 printed for patient to pick-up, 06/10/2017.

## 2017-06-13 ENCOUNTER — Other Ambulatory Visit: Payer: Medicaid Other

## 2017-06-13 DIAGNOSIS — Z79891 Long term (current) use of opiate analgesic: Secondary | ICD-10-CM

## 2017-06-13 MED FILL — oxyCODONE HCL 10 MG TABS: 10 | 15 days supply | Qty: 90 | Fill #0

## 2017-06-14 LAB — PAIN MGMT, PROFILE 8 W/CONF, U
6 Acetylmorphine: NEGATIVE ng/mL (ref <10)
Alcohol Metabolites: NEGATIVE ng/mL (ref <500)
Amphetamines: NEGATIVE ng/mL (ref <500)
Benzodiazepines: NEGATIVE ng/mL (ref <100)
Buprenorphine: NEGATIVE ng/mL (ref <5)
Cocaine Metabolite: NEGATIVE ng/mL (ref <150)
Creatinine: 82.5 mg/dL (ref >=20.0)
MDMA: NEGATIVE ng/mL (ref <500)
Marijuana Metabolite: NEGATIVE ng/mL (ref <20)
Opiates: NEGATIVE ng/mL (ref <100)
Oxidant: NEGATIVE ug/mL (ref <200)
Oxycodone: NEGATIVE ng/mL (ref <100)
Please note:: 0
pH: 7.22 (ref 4.5–9.0)

## 2017-06-16 ENCOUNTER — Ambulatory Visit: Payer: Medicaid Other | Admitting: Family Medicine

## 2017-06-16 ENCOUNTER — Telehealth: Payer: Self-pay

## 2017-06-16 ENCOUNTER — Ambulatory Visit (INDEPENDENT_AMBULATORY_CARE_PROVIDER_SITE_OTHER): Payer: Medicaid Other | Admitting: Family Medicine

## 2017-06-16 ENCOUNTER — Encounter: Payer: Self-pay | Admitting: Family Medicine

## 2017-06-16 VITALS — BP 118/60 | HR 84 | Temp 98.7°F | Resp 14 | Ht 64.0 in | Wt 162.2 lb

## 2017-06-16 DIAGNOSIS — Z23 Encounter for immunization: Secondary | ICD-10-CM

## 2017-06-16 DIAGNOSIS — I498 Other specified cardiac arrhythmias: Secondary | ICD-10-CM

## 2017-06-16 DIAGNOSIS — D571 Sickle-cell disease without crisis: Secondary | ICD-10-CM

## 2017-06-16 DIAGNOSIS — F1190 Opioid use, unspecified, uncomplicated: Secondary | ICD-10-CM

## 2017-06-16 NOTE — Progress Notes (Signed)
Patient ID: Debra Williamson, female    DOB: Nov 24, 1980, 36 y.o.   MRN: [id number]  PCP: Bing Neighbors, FNP  Chief Complaint  Patient presents with  . Follow-up    1 MONTH    Subjective:  HPI Debra Williamson is a 36 y.o. female with sickle cell anemia presents for a 1 month follow-up. She reports 4/10 joint pain to the  shoulders and knees. Debra Williamson picked her pain medication on 06/13/2017 reports that she has only taken 1 tablet as she is trying to reduce the frequency in which she takes pain medication. Debra Williamson last urine drug screen collected on 06/13/17 was negative of opioids. She admits that she only takes medication as needed and not on a daily basis. Other complaint today includes chronic nausea and on going flutter sensation in chest that radiates to her back. Recent echocardiogram was unremarkable from July. Next follow-up schedule with [facility] hematology in September. She received chronic apheresis exchanges every 4 weeks. Baseline hemoglobin is in the 7s'. Debra Williamson reports no other complaints today.  Social History   Social History  . Marital status: Married    Spouse name: N/A  . Number of children: 4  . Years of education: N/A   Occupational History  . Doesn't work    Social History Main Topics  . Smoking status: Never Smoker  . Smokeless tobacco: Never Used  . Alcohol use No     Comment: occasionally  . Drug use: No  . Sexual activity: Yes   Other Topics Concern  . Not on file   Social History Narrative  . No narrative on file    Family History  Problem Relation Age of Onset  . HIV/AIDS Father    Review of Systems See HPI  Patient Active Problem List   Diagnosis Date Noted  . TIA (transient ischemic attack)   . Vertigo 09/07/2016  . Dyspnea 09/07/2016  . Lightheadedness 09/06/2016  . Healthcare maintenance 07/30/2016  . Dizziness, nonspecific 07/30/2016  . Left foot drop 07/30/2016  . Left-sided weakness 07/30/2016  . 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 History of stroke associated with blood clotting tendency 01/02/2016  . [place] disease (HCC) 10/27/2015  . Hb-SS disease with acute chest syndrome (HCC) 08/14/2015  . Acute chest syndrome (HCC)   . Acute respiratory failure with hypoxemia (HCC)   . Community acquired pneumonia 08/08/2015  . Hemiparesis following cerebrovascular accident (CVA) (HCC) 05/30/2015  . Ankle gives out 05/30/2015  . Seizure disorder (HCC) 05/30/2015  . 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 H/O: stroke 03/12/2015  . Seizures (HCC) 03/12/2015  . Sickle cell disease (HCC) 03/11/2015  . Sickle cell crisis (HCC) 03/11/2015  . Symptomatic anemia 03/11/2015    Allergies  Allergen Reactions  . Ibuprofen Hives  . Zofran [Ondansetron Hcl] Hives and Itching  . Demerol [Meperidine] Other (See Comments)    Pt has seizures  . Fentanyl And Related Nausea And Vomiting and Other (See Comments)    Very sedated  **Not allergic to the injection** JUST ALLERGIC TO PATCH  . Other Itching    Greek Yogurt  . Codeine Hives and Itching  . Hydrocodone Hives and Itching    Prior to Admission medications   Medication Sig Start Date End Date Taking? Authorizing Provider  albuterol (PROVENTIL HFA;VENTOLIN HFA) 108 (90 Base) MCG/ACT inhaler Inhale 2 puffs into the lungs every 4 (four) hours as  needed for wheezing or shortness of breath (cough, shortness of breath or wheezing.). 02/07/17  Yes Bing Neighbors, FNP  Ascorbic Acid (VITAMIN C) 1000 MG tablet Take 1,000 mg by mouth every evening.   Yes [provider]  aspirin EC 81 MG tablet Take 1 tablet by mouth every evening.  11/03/13  Yes [provider]  diclofenac sodium (VOLTAREN) 1 % GEL Apply 2 g topically 4 (four) times daily. 07/30/16  Yes Quentin Angst, MD  folic acid (FOLVITE) 1 MG  tablet Take 1 mg by mouth daily.  02/13/15  Yes [provider]  lamoTRIgine (LAMICTAL) 200 MG tablet Take 1 tablet (200 mg total) by mouth 2 (two) times daily. 05/13/17  Yes Bing Neighbors, FNP  levETIRAcetam (KEPPRA) 1000 MG tablet Take 1 tablet (1,000 mg total) by mouth 2 (two) times daily. 05/13/17  Yes Bing Neighbors, FNP  neomycin-polymyxin-hydrocortisone (CORTISPORIN) OTIC solution Place 3 drops into the left ear 3 (three) times daily. 05/13/17  Yes Bing Neighbors, FNP  omeprazole (PRILOSEC) 40 MG capsule Take 1 capsule (40 mg total) by mouth daily. 05/13/17  Yes Bing Neighbors, FNP  oxyCODONE (OXYCONTIN) 30 MG 12 hr tablet Take 30 mg by mouth every 12 (twelve) hours. No fill before 04/10/17 06/09/17 07/09/17 Yes Bing Neighbors, FNP  Oxycodone HCl 10 MG TABS Take 1 tablet (10 mg total) by mouth every 4 (four) hours as needed. 06/09/17 06/24/17 Yes Bing Neighbors, FNP  promethazine (PHENERGAN) 25 MG tablet Take 1 tablet (25 mg total) by mouth every 8 (eight) hours as needed for nausea or vomiting. 1 tablet every 8 hours prn 05/13/17  Yes Bing Neighbors, FNP  zolpidem (AMBIEN) 5 MG tablet Take 1 tablet (5 mg total) by mouth at bedtime as needed. sleep 03/13/15  Yes Altha Harm, MD    Past Medical, Surgical Family and Social History reviewed and updated.    Objective:   Today's Vitals   06/16/17 0814  BP: 118/60  Pulse: 84  Resp: 14  Temp: 98.7 F (37.1 C)  TempSrc: Oral  SpO2: 96%  Weight: 162 lb 3.2 oz (73.6 kg)  Height: 5\' 4"  (1.626 m)    Wt Readings from Last 3 Encounters:  06/16/17 162 lb 3.2 oz (73.6 kg)  05/13/17 160 lb 3.2 oz (72.7 kg)  02/07/17 158 lb (71.7 kg)    Physical Exam         Assessment & Plan:  1. Hb-SS disease without crisis [facility])  Pulmonary evaluation - Patient denies severe recurrent wheezes, shortness of breath with exercise, or persistent cough. If these symptoms develop, pulmonary function tests with  spirometry will be ordered, and if abnormal, plan on referral to Pulmonology for further evaluation.  Eye - High risk of proliferative retinopathy. Annual eye exam with retinal exam recommended to patient, the patient has had eye exam this year.  Immunization status - Yearly influenza vaccination is recommended, as well as being up to date with Meningococcal and Pneumococcal vaccines.   Acute and chronic painful episodes - We agreed on Opiate dose and amount of pills  per month. We discussed that pt is to receive Schedule II prescriptions only from our clinic. Pt is also aware that the prescription history is available to [place] online through the [facility] CSRS. Controlled substance agreement reviewed and signed. We reminded  .[name] that all patients receiving Schedule II narcotics must be seen for follow within one month of prescription being requested. We  reviewed the terms of our pain agreement, including the need to keep medicines in a safe locked location away from children or pets, and the need to report excess sedation or constipation, measures to avoid constipation, and policies related to early refills and stolen prescriptions. According to the [place] Chronic Pain Initiative program, we have reviewed details related to analgesia, adverse effects and aberrant behaviors.   2. Fluttering heart - EKG 12-Lead, NSR , no evidence of ischemia or changes compared to prior EKGs  3. Need for influenza vaccination - Flu Vaccine QUAD 36+ mos IM  4. Chronic, continuous use of opioids -Recent negative urine drug screen. Patient reports that she is unable to urinate and has only taken one tablet of oxycodone from her 57 qty supply picked up on 06/13/2017.  -Advised that she will need to return in 7 days to submit a urine sample in order to continue receiving prescriptions.  Return in about 4 weeks for Sickle Cell Disease/Pain.  The patient was given clear instructions to go to ER or return to medical  center if symptoms don't improve, worsen or new problems develop. The patient verbalized understanding. The patient was told to call to get lab results if they haven't heard anything in the next week.    Godfrey Pick. Tiburcio Pea, MSN, FNP-C The Patient Care [facility]  [address] [place], [place] [address] [phone]

## 2017-06-17 MED FILL — OxyCONTIN 30 MG T12A: 30 | 30 days supply | Qty: 60 | Fill #0

## 2017-06-17 NOTE — Telephone Encounter (Signed)
Patient states that PA went through pharmacy just didn't have enough to fill it

## 2017-06-19 ENCOUNTER — Telehealth: Payer: Self-pay | Admitting: Family Medicine

## 2017-06-19 NOTE — Telephone Encounter (Deleted)
Debra Williamson

## 2017-06-19 NOTE — Telephone Encounter (Signed)
Erroneous

## 2017-06-27 ENCOUNTER — Telehealth: Payer: Self-pay

## 2017-06-27 ENCOUNTER — Other Ambulatory Visit: Payer: Medicaid Other

## 2017-06-27 DIAGNOSIS — Z79891 Long term (current) use of opiate analgesic: Secondary | ICD-10-CM

## 2017-06-27 NOTE — Telephone Encounter (Signed)
Patient was unable to give a urine sample in office and was told to come back to office when she can leave a sample. Patient notified that she want get any scripts until sample is given.

## 2017-06-30 ENCOUNTER — Other Ambulatory Visit: Payer: Medicaid Other

## 2017-06-30 DIAGNOSIS — Z79891 Long term (current) use of opiate analgesic: Secondary | ICD-10-CM

## 2017-07-04 LAB — PAIN MGMT, PROFILE 8 W/CONF, U
6 Acetylmorphine: NEGATIVE ng/mL (ref <10)
Alcohol Metabolites: NEGATIVE ng/mL (ref <500)
Amphetamines: NEGATIVE ng/mL (ref <500)
Benzodiazepines: NEGATIVE ng/mL (ref <100)
Buprenorphine, Urine: NEGATIVE ng/mL (ref <5)
Cocaine Metabolite: NEGATIVE ng/mL (ref <150)
Codeine: NEGATIVE ng/mL (ref <50)
Creatinine: 84.8 mg/dL
Hydrocodone: NEGATIVE ng/mL (ref <50)
Hydromorphone: NEGATIVE ng/mL (ref <50)
MDMA: NEGATIVE ng/mL (ref <500)
Marijuana Metabolite: NEGATIVE ng/mL (ref <20)
Morphine: NEGATIVE ng/mL (ref <50)
Norhydrocodone: NEGATIVE ng/mL (ref <50)
Noroxycodone: 7010 ng/mL — ABNORMAL HIGH (ref <50)
Opiates: NEGATIVE ng/mL (ref <100)
Oxidant: NEGATIVE ug/mL (ref <200)
Oxycodone: 3983 ng/mL — ABNORMAL HIGH (ref <50)
Oxycodone: POSITIVE ng/mL — AB (ref <100)
Oxymorphone: 1374 ng/mL — ABNORMAL HIGH (ref <50)
pH: 6.31 (ref 4.5–9.0)

## 2017-07-07 MED FILL — EXJADE 500 MG TABLET: 500 | 30 days supply | Qty: 90 | Fill #3

## 2017-07-10 MED FILL — levETIRAcetam 1000 MG TABS: 1000 | 30 days supply | Qty: 60 | Fill #1

## 2017-07-10 MED FILL — lamoTRIgine 200 MG TABS: 200 | 30 days supply | Qty: 60 | Fill #1

## 2017-07-14 ENCOUNTER — Telehealth: Payer: Self-pay

## 2017-07-14 DIAGNOSIS — R110 Nausea: Secondary | ICD-10-CM

## 2017-07-14 MED ORDER — PROMETHAZINE HCL 25 MG PO TABS
25.0000 mg | ORAL_TABLET | Freq: Three times a day (TID) | ORAL | 2 refills | Status: DC | PRN
Start: 2017-07-14 — End: 2017-08-17

## 2017-07-14 MED ORDER — OXYCODONE HCL 10 MG PO TABS: 10.0000 mg | ORAL_TABLET | ORAL | 0 refills | Status: DC | PRN

## 2017-07-14 MED ORDER — OXYCODONE HCL ER 30 MG PO T12A: 30.0000 mg | EXTENDED_RELEASE_TABLET | Freq: Two times a day (BID) | ORAL | 0 refills | Status: DC | PRN

## 2017-07-14 NOTE — Telephone Encounter (Signed)
Oxycodone 10 Qty 90 and Oxycontin 30 qty 60. Fill dates today.

## 2017-07-14 NOTE — Addendum Note (Signed)
Addended by: Bing Neighbors on: 07/14/2017 05:30 PM   Modules accepted: Orders

## 2017-07-15 MED FILL — OxyCONTIN 30 MG T12A: 30 | 30 days supply | Qty: 60 | Fill #0

## 2017-07-17 ENCOUNTER — Ambulatory Visit: Payer: Medicaid Other | Admitting: Family Medicine

## 2017-07-17 ENCOUNTER — Other Ambulatory Visit: Payer: Self-pay | Admitting: Family Medicine

## 2017-07-17 MED ORDER — OXYCODONE HCL 10 MG PO TABS: 10.0000 mg | ORAL_TABLET | Freq: Four times a day (QID) | ORAL | 0 refills | Status: DC | PRN

## 2017-07-17 NOTE — Telephone Encounter (Signed)
Spoke with Arlys John at [facility] outpatient pharmacy and they have the script for the oxycodone . Patient will come by tomorrow and pickup new script

## 2017-07-17 NOTE — Progress Notes (Signed)
Reissued prescription for Oxycodone 10 mg qty: 60 every 6 hours as needed for pain due to insurance will not allow for additional milliequivalent previously prescribed.

## 2017-07-18 ENCOUNTER — Ambulatory Visit (INDEPENDENT_AMBULATORY_CARE_PROVIDER_SITE_OTHER): Payer: Medicaid Other | Admitting: Family Medicine

## 2017-07-18 ENCOUNTER — Encounter: Payer: Self-pay | Admitting: Family Medicine

## 2017-07-18 VITALS — BP 126/60 | HR 92 | Temp 98.9°F | Resp 12 | Ht 64.0 in | Wt 161.4 lb

## 2017-07-18 DIAGNOSIS — R29898 Other symptoms and signs involving the musculoskeletal system: Secondary | ICD-10-CM | POA: Diagnosis not present

## 2017-07-18 DIAGNOSIS — F1190 Opioid use, unspecified, uncomplicated: Secondary | ICD-10-CM | POA: Diagnosis not present

## 2017-07-18 DIAGNOSIS — D571 Sickle-cell disease without crisis: Secondary | ICD-10-CM | POA: Diagnosis not present

## 2017-07-18 MED FILL — oxyCODONE HCL 10 MG TABS: 10 | 15 days supply | Qty: 60 | Fill #0

## 2017-07-18 NOTE — Patient Instructions (Signed)
Chronic Pain, Adult Chronic pain is a type of pain that lasts or keeps coming back (recurs) for at least six months. You may have chronic headaches, abdominal pain, or body pain. Chronic pain may be related to an illness, such as fibromyalgia or complex regional pain syndrome. Sometimes the cause of chronic pain is not known. Chronic pain can make it hard for you to do daily activities. If not treated, chronic pain can lead to other health problems, including anxiety and depression. Treatment depends on the cause and severity of your pain. You may need to work with a pain specialist to come up with a treatment plan. The plan may include medicine, counseling, and physical therapy. Many people benefit from a combination of two or more types of treatment to control their pain. Follow these instructions at home: Lifestyle  Consider keeping a pain diary to share with your health care providers.  Consider talking with a mental health care provider (psychologist) about how to cope with chronic pain.  Consider joining a chronic pain support group.  Try to control or lower your stress levels. Talk to your health care provider about strategies to do this. General instructions   Take over-the-counter and prescription medicines only as told by your health care provider.  Follow your treatment plan as told by your health care provider. This may include: ? Gentle, regular exercise. ? Eating a healthy diet that includes foods such as vegetables, fruits, fish, and lean meats. ? Cognitive or behavioral therapy. ? Working with a Adult nurse. ? Meditation or yoga. ? Acupuncture or massage therapy. ? Aroma, color, light, or sound therapy. ? Local electrical stimulation. ? Shots (injections) of numbing or pain-relieving medicines into the spine or the area of pain.  Check your pain level as told by your health care provider. Ask your health care provider if you should use a pain scale.  Learn as  much as you can about how to manage your chronic pain. Ask your health care provider if an intensive pain rehabilitation program or a chronic pain specialist would be helpful.  Keep all follow-up visits as told by your health care provider. This is important. Contact a health care provider if:  Your pain gets worse.  You have new pain.  You have trouble sleeping.  You have trouble doing your normal activities.  Your pain is not controlled with treatment.  Your have side effects from pain medicine.  You feel weak. Get help right away if:  You lose feeling or have numbness in your body.  You lose control of bowel or bladder function.  Your pain suddenly gets much worse.  You develop shaking or chills.  You develop confusion.  You develop chest pain.  You have trouble breathing or shortness of breath.  You pass out.  You have thoughts about hurting yourself or others. This information is not intended to replace advice given to you by your health care provider. Make sure you discuss any questions you have with your health care provider. Document Released: 06/29/2002 Document Revised: 06/06/2016 Document Reviewed: 03/26/2016 Elsevier Interactive Patient Education  2017 [employer] Sickle Cell Anemia, Adult Sickle cell anemia is a condition where your red blood cells are shaped like sickles. Red blood cells carry oxygen through the body. Sickle-shaped red blood cells do not live as long as normal red blood cells. They also clump together and block blood from flowing through the blood vessels. These things prevent the body from getting enough oxygen. Sickle cell  anemia causes organ damage and pain. It also increases the risk of infection. Follow these instructions at home:  Drink enough fluid to keep your pee (urine) clear or pale yellow. Drink more in hot weather and during exercise.  Do not smoke. Smoking lowers oxygen levels in the blood.  Only take over-the-counter or  prescription medicines as told by your doctor.  Take antibiotic medicines as told by your doctor. Make sure you finish them even if you start to feel better.  Take supplements as told by your doctor.  Consider wearing a medical alert bracelet. This tells anyone caring for you in an emergency of your condition.  When traveling, keep your medical information, doctors' names, and the medicines you take with you at all times.  If you have a fever, do not take fever medicines right away. This could cover up a problem. Tell your doctor.  Keep all follow-up visits with your doctor. Sickle cell anemia requires regular medical care. Contact a doctor if: You have a fever. Get help right away if:  You feel dizzy or faint.  You have new belly (abdominal) pain, especially on the left side near the stomach area.  You have a lasting, often uncomfortable and painful erection of the penis (priapism). If it is not treated right away, you will become unable to have sex (impotence).  You have numbness in your arms or legs or you have a hard time moving them.  You have a hard time talking.  You have a fever or lasting symptoms for more than 2-3 days.  You have a fever and your symptoms suddenly get worse.  You have signs or symptoms of infection. These include: ? Chills. ? Being more tired than normal (lethargy). ? Irritability. ? Poor eating. ? Throwing up (vomiting).  You have pain that is not helped with medicine.  You have shortness of breath.  You have pain in your chest.  You are coughing up pus-like or bloody mucus.  You have a stiff neck.  Your feet or hands swell or have pain.  Your belly looks bloated.  Your joints hurt. This information is not intended to replace advice given to you by your health care provider. Make sure you discuss any questions you have with your health care provider. Document Released: 07/28/2013 Document Revised: 03/14/2016 Document Reviewed:  05/19/2013 Elsevier Interactive Patient Education  2017 [employer].

## 2017-07-18 NOTE — Progress Notes (Signed)
Patient ID: Debra Williamson, female    DOB: 1980-12-27, 36 y.o.   MRN: [id number]  PCP: Bing Neighbors, FNP  Chief Complaint  Patient presents with  . Follow-up    1 month    Subjective:  HPI Debra Williamson is a 36 y.o. female with sickle cell anemia  presents for monthly medication management. [name] reports recently experiencing daily pain ranging 3-4/10. Pain is tolerable and manageable with current home pain medication regimen. She suffers from chronic dyspnea which is exacerbated by activity.  Normal shortness of breath. Uses albuterol for shortness breath symptoms although reports use only occurring a few days per week. Recent echocardiogram completed at [facility] was insufficient in identifying a reason for chronic dyspnea. She denies current chest pain, cough, fever, dysuria, or headache. Social History   Social History  . Marital status: Married    Spouse name: Debra Williamson  . Number of children: 4  . Years of education: Debra Williamson   Occupational History  . Doesn't work    Social History Main Topics  . Smoking status: Never Smoker  . Smokeless tobacco: Never Used  . Alcohol use No     Comment: occasionally  . Drug use: No  . Sexual activity: Yes   Other Topics Concern  . Not on file   Social History Narrative  . No narrative on file    Family History  Problem Relation Age of Onset  . HIV/AIDS Father    Review of Systems See HPI Patient Active Problem List   Diagnosis Date Noted  . TIA (transient ischemic attack)   . Vertigo 09/07/2016  . Dyspnea 09/07/2016  . Lightheadedness 09/06/2016  . Healthcare maintenance 07/30/2016  . Dizziness, nonspecific 07/30/2016  . Left foot drop 07/30/2016  . Left-sided weakness 07/30/2016  . 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 History of stroke associated with blood clotting tendency  01/02/2016  . [place] disease (HCC) 10/27/2015  . Hb-SS disease with acute chest syndrome (HCC) 08/14/2015  . Acute chest syndrome (HCC)   . Acute respiratory failure with hypoxemia (HCC)   . Community acquired pneumonia 08/08/2015  . Hemiparesis following cerebrovascular accident (CVA) (HCC) 05/30/2015  . Ankle gives out 05/30/2015  . Seizure disorder (HCC) 05/30/2015  . 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 H/O: stroke 03/12/2015  . Seizures (HCC) 03/12/2015  . Sickle cell disease (HCC) 03/11/2015  . Sickle cell crisis (HCC) 03/11/2015  . Symptomatic anemia 03/11/2015    Allergies  Allergen Reactions  . Ibuprofen Hives  . Zofran [Ondansetron Hcl] Hives and Itching  . Demerol [Meperidine] Other (See Comments)    Pt has seizures  . Fentanyl And Related Nausea And Vomiting and Other (See Comments)    Very sedated  **Not allergic to the injection** JUST ALLERGIC TO PATCH  . Other Itching    Greek Yogurt  . Codeine Hives and Itching  . Hydrocodone Hives and Itching    Prior to Admission medications   Medication Sig Start Date End Date Taking? Authorizing Provider  albuterol (PROVENTIL HFA;VENTOLIN HFA) 108 (90 Base) MCG/ACT inhaler Inhale 2 puffs into the lungs every 4 (four) hours as needed for wheezing or shortness of breath (cough, shortness of breath or wheezing.). 02/07/17  Yes Bing Neighbors, FNP  Ascorbic Acid (VITAMIN C) 1000 MG tablet Take 1,000 mg by mouth every evening.   Yes [provider]  aspirin EC 81 MG tablet  Take 1 tablet by mouth every evening.  11/03/13  Yes [provider]  diclofenac sodium (VOLTAREN) 1 % GEL Apply 2 g topically 4 (four) times daily. 07/30/16  Yes Quentin Angst, MD  folic acid (FOLVITE) 1 MG tablet Take 1 mg by mouth daily.  02/13/15  Yes [provider]  lamoTRIgine (LAMICTAL) 200 MG tablet Take 1 tablet (200 mg total) by mouth 2 (two) times daily. 05/13/17  Yes Bing Neighbors, FNP   levETIRAcetam (KEPPRA) 1000 MG tablet Take 1 tablet (1,000 mg total) by mouth 2 (two) times daily. 05/13/17  Yes Bing Neighbors, FNP  omeprazole (PRILOSEC) 40 MG capsule Take 1 capsule (40 mg total) by mouth daily. 05/13/17  Yes Bing Neighbors, FNP  oxyCODONE (OXYCONTIN) 30 MG 12 hr tablet Take 30 mg by mouth every 12 (twelve) hours as needed. 07/14/17  Yes Bing Neighbors, FNP  Oxycodone HCl 10 MG TABS Take 1 tablet (10 mg total) by mouth every 6 (six) hours as needed. 07/17/17  Yes Bing Neighbors, FNP  promethazine (PHENERGAN) 25 MG tablet Take 1 tablet (25 mg total) by mouth every 8 (eight) hours as needed for nausea or vomiting. 1 tablet every 8 hours prn 07/14/17  Yes Bing Neighbors, FNP  zolpidem (AMBIEN) 5 MG tablet Take 1 tablet (5 mg total) by mouth at bedtime as needed. sleep 03/13/15  Yes Altha Harm, MD  neomycin-polymyxin-hydrocortisone (CORTISPORIN) OTIC solution Place 3 drops into the left ear 3 (three) times daily. Patient not taking: Reported on 07/18/2017 05/13/17   Bing Neighbors, FNP    Past Medical, Surgical Family and Social History reviewed and updated.    Objective:   Today's Vitals   07/18/17 0819  BP: 126/60  Pulse: 92  Resp: 12  Temp: 98.9 F (37.2 C)  TempSrc: Oral  SpO2: 96%  Weight: 161 lb 6.4 oz (73.2 kg)  Height:  (1.626 m)    Wt Readings from Last 3 Encounters:  07/18/17 161 lb 6.4 oz (73.2 kg)  06/16/17 162 lb 3.2 oz (73.6 kg)  05/13/17 160 lb 3.2 oz (72.7 kg)   Physical Exam  Constitutional: She is oriented to person, place, and time.  HENT:  Head: Normocephalic and atraumatic.  Eyes: Pupils are equal, round, and reactive to light. Conjunctivae are normal.  Neck: Normal range of motion. Neck supple.  Cardiovascular: Normal rate, regular rhythm, normal heart sounds and intact distal pulses.   Pulmonary/Chest: Effort normal and breath sounds normal.  Abdominal: Soft. Bowel sounds are normal.  Musculoskeletal:  She exhibits deformity.  Left sided weakness   Neurological: She is alert and oriented to person, place, and time.  Skin: Skin is warm and dry.  Psychiatric: She has a normal mood and affect. Her behavior is normal. Judgment and thought content normal.   Assessment & Plan:  1. Hb-SS disease without crisis [facility])  Pulmonary evaluation - Patient denies severe recurrent wheezes, shortness of breath with exercise, or persistent cough. If these symptoms develop, pulmonary function tests with spirometry will be ordered, and if abnormal, plan on referral to Pulmonology for further evaluation.  Eye - High risk of proliferative retinopathy. Annual eye exam with retinal exam recommended to patient, the patient has had eye exam this year.  Immunization status - Yearly influenza vaccination is recommended, as well as being up to date with Meningococcal and Pneumococcal vaccines.   Acute and chronic painful episodes - We agreed on Opiate dose and amount of  pills  per month. We discussed that pt is to receive Schedule II prescriptions only from our clinic. Pt is also aware that the prescription history is available to [place] online through the [facility] CSRS. Controlled substance agreement reviewed and signed. We reminded  .[name] that all patients receiving Schedule II narcotics must be seen for follow within one month of prescription being requested. We reviewed the terms of our pain agreement, including the need to keep medicines in a safe locked location away from children or pets, and the need to report excess sedation or constipation, measures to avoid constipation, and policies related to early refills and stolen prescriptions. According to the [place] [facility] program, we have reviewed details related to analgesia, adverse effects and aberrant behaviors.    2. Chronic, continuous use of opioids -Continue oxycodone 10 mg every 6 hours as need and OxyContin 30 mg every 12 hours.   3. Left arm  weakness -Left arm sling- DME Other see comment    Return in about 4 weeks for Sickle Cell Disease/Pain.  The patient was given clear instructions to go to ER or return to medical center if symptoms don't improve, worsen or new problems develop. The patient verbalized understanding. The patient was told to call to get lab results if they haven't heard anything in the next week.      Godfrey Pick. Tiburcio Pea, MSN, FNP-C [facility]  [address] [place], [place] [address] [phone]

## 2017-07-22 ENCOUNTER — Ambulatory Visit (INDEPENDENT_AMBULATORY_CARE_PROVIDER_SITE_OTHER): Payer: Medicaid Other | Admitting: Family Medicine

## 2017-07-22 ENCOUNTER — Ambulatory Visit (HOSPITAL_COMMUNITY)
Admission: RE | Admit: 2017-07-22 | Discharge: 2017-07-22 | Disposition: A | Discharge status: Home / Self Care | Payer: Medicaid Other | Source: Ambulatory Visit | Attending: Family Medicine | Admitting: Family Medicine

## 2017-07-22 ENCOUNTER — Encounter: Payer: Self-pay | Admitting: Family Medicine

## 2017-07-22 VITALS — BP 120/74 | HR 88 | Temp 98.6°F | Resp 14 | Ht 64.0 in | Wt 160.0 lb

## 2017-07-22 DIAGNOSIS — R0602 Shortness of breath: Secondary | ICD-10-CM | POA: Diagnosis not present

## 2017-07-22 NOTE — Progress Notes (Signed)
Patient ID: Debra Williamson, female    DOB: [birth year], 36 y.o.   MRN: [id number]  PCP: Bing Neighbors, FNP  Chief Complaint  Patient presents with  . Shortness of Breath    Subjective:  HPI Debra Williamson is a 36 y.o. female presents for evaluation of shortness of breath. She reports that her electric company disconnected her power on yesterday and she developed shortness of breath and chest tightness. She woke up three times during the night with air hunger due to warm temperature. Wheezing and shortness of breath resolved with exposure to air. At present she is asymptomatic. She brings with her a emergency electric reconnection form and requests completion to have her electric service turned back on due to chronic medical condition.  Social History   Social History  . Marital status: Married    Spouse name: N/A  . Number of children: 4  . Years of education: N/A   Occupational History  . Doesn't work    Social History Main Topics  . Smoking status: Never Smoker  . Smokeless tobacco: Never Used  . Alcohol use No     Comment: occasionally  . Drug use: No  . Sexual activity: Yes   Other Topics Concern  . Not on file   Social History Narrative  . No narrative on file    Family History  Problem Relation Age of Onset  . HIV/AIDS Father    Review of Systems  Constitutional: Negative.   HENT: Negative.   Respiratory: Positive for shortness of breath and wheezing.        Chest tightness over night-resolved presently  Cardiovascular: Negative.   Skin: Negative.   Neurological: Negative for dizziness, light-headedness and headaches.  Psychiatric/Behavioral: Negative.     Patient Active Problem List   Diagnosis Date Noted  . TIA (transient ischemic attack)   . Vertigo 09/07/2016  . Dyspnea 09/07/2016  . Lightheadedness 09/06/2016  . Healthcare maintenance 07/30/2016  . Dizziness, nonspecific 07/30/2016  . Left foot drop 07/30/2016  . Left-sided weakness  07/30/2016  . 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 History of stroke associated with blood clotting tendency 01/02/2016  . [place] disease (HCC) 10/27/2015  . Hb-SS disease with acute chest syndrome (HCC) 08/14/2015  . Acute chest syndrome (HCC)   . Acute respiratory failure with hypoxemia (HCC)   . Community acquired pneumonia 08/08/2015  . Hemiparesis following cerebrovascular accident (CVA) (HCC) 05/30/2015  . Ankle gives out 05/30/2015  . Seizure disorder (HCC) 05/30/2015  . 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 H/O: stroke 03/12/2015  . Seizures (HCC) 03/12/2015  . Sickle cell disease (HCC) 03/11/2015  . Sickle cell crisis (HCC) 03/11/2015  . Symptomatic anemia 03/11/2015    Allergies  Allergen Reactions  . Ibuprofen Hives  . Zofran [Ondansetron Hcl] Hives and Itching  . Demerol [Meperidine] Other (See Comments)    Pt has seizures  . Fentanyl And Related Nausea And Vomiting and Other (See Comments)    Very sedated  **Not allergic to the injection** JUST ALLERGIC TO PATCH  . Other Itching    Greek Yogurt  . Codeine Hives and Itching  . Hydrocodone Hives and Itching    Prior to Admission medications   Medication Sig Start Date End Date Taking? Authorizing Provider  albuterol (PROVENTIL HFA;VENTOLIN HFA) 108 (90 Base) MCG/ACT inhaler Inhale 2 puffs into the lungs every 4 (four) hours as needed  for wheezing or shortness of breath (cough, shortness of breath or wheezing.). 02/07/17  Yes Bing Neighbors, FNP  Ascorbic Acid (VITAMIN C) 1000 MG tablet Take 1,000 mg by mouth every evening.   Yes [provider]  aspirin EC 81 MG tablet Take 1 tablet by mouth every evening.  11/03/13  Yes [provider]  diclofenac sodium (VOLTAREN) 1 % GEL Apply 2 g topically 4 (four) times daily. 07/30/16  Yes Quentin Angst,  MD  folic acid (FOLVITE) 1 MG tablet Take 1 mg by mouth daily.  02/13/15  Yes [provider]  lamoTRIgine (LAMICTAL) 200 MG tablet Take 1 tablet (200 mg total) by mouth 2 (two) times daily. 05/13/17  Yes Bing Neighbors, FNP  levETIRAcetam (KEPPRA) 1000 MG tablet Take 1 tablet (1,000 mg total) by mouth 2 (two) times daily. 05/13/17  Yes Bing Neighbors, FNP  neomycin-polymyxin-hydrocortisone (CORTISPORIN) OTIC solution Place 3 drops into the left ear 3 (three) times daily. 05/13/17  Yes Bing Neighbors, FNP  omeprazole (PRILOSEC) 40 MG capsule Take 1 capsule (40 mg total) by mouth daily. 05/13/17  Yes Bing Neighbors, FNP  oxyCODONE (OXYCONTIN) 30 MG 12 hr tablet Take 30 mg by mouth every 12 (twelve) hours as needed. 07/14/17  Yes Bing Neighbors, FNP  Oxycodone HCl 10 MG TABS Take 1 tablet (10 mg total) by mouth every 6 (six) hours as needed. 07/17/17  Yes Bing Neighbors, FNP  promethazine (PHENERGAN) 25 MG tablet Take 1 tablet (25 mg total) by mouth every 8 (eight) hours as needed for nausea or vomiting. 1 tablet every 8 hours prn 07/14/17  Yes Bing Neighbors, FNP  zolpidem (AMBIEN) 5 MG tablet Take 1 tablet (5 mg total) by mouth at bedtime as needed. sleep 03/13/15  Yes Altha Harm, MD    Past Medical, Surgical Family and Social History reviewed and updated.    Objective:   Today's Vitals   07/22/17 1350  BP: 120/74  Pulse: 88  Resp: 14  Temp: 98.6 F (37 C)  TempSrc: Oral  SpO2: 96%  Weight: 160 lb (72.6 kg)  Height:  (1.626 m)   Wt Readings from Last 3 Encounters:  07/22/17 160 lb (72.6 kg)  07/18/17 161 lb 6.4 oz (73.2 kg)  06/16/17 162 lb 3.2 oz (73.6 kg)   Physical Exam  Constitutional: She is oriented to person, place, and time. She appears well-developed and well-nourished.  HENT:  Head: Normocephalic and atraumatic.  Eyes: Pupils are equal, round, and reactive to light. Conjunctivae and EOM are normal.  Neck: Normal range of  motion. Neck supple.  Cardiovascular: Normal rate, regular rhythm, normal heart sounds and intact distal pulses.   Pulmonary/Chest: Effort normal and breath sounds normal.  Lymphadenopathy:    She has no cervical adenopathy.  Neurological: She is alert and oriented to person, place, and time.  Psychiatric: She has a normal mood and affect. Her behavior is normal. Judgment and thought content normal.    Assessment & Plan:  1. Shortness of breath- this is chronic problem that patient has been worked up for this complaint in the past. Recent unremarkable echo completed at [facility]. Obtaining a chest x-ray to rule out cardio-pulmonary process.   Orders Placed This Encounter  Procedures  . DG Chest 2 View    Dg Chest 2 View  Result Date: 07/22/2017 CLINICAL DATA:  Shortness of breath. EXAM: CHEST  2 VIEW COMPARISON:  September 06, 2016 FINDINGS: A  double lumen right porta catheter is stable terminating in the SVC. No pneumothorax. The heart, hila, and mediastinum are normal. No pulmonary nodules, masses, or focal infiltrate. IMPRESSION: No active cardiopulmonary disease. Electronically Signed   By: Gerome Sam III M.D   On: 07/22/2017 15:24    Completed form for [employer].  RTC: Keep previously scheduled follow-up.  Godfrey Pick. Tiburcio Pea, MSN, FNP-C [facility]  [address] [place], [place] [address] [phone]

## 2017-08-01 ENCOUNTER — Ambulatory Visit (INDEPENDENT_AMBULATORY_CARE_PROVIDER_SITE_OTHER): Payer: Medicaid Other | Admitting: Family Medicine

## 2017-08-01 ENCOUNTER — Encounter: Payer: Self-pay | Admitting: Family Medicine

## 2017-08-01 VITALS — BP 114/68 | HR 80 | Temp 98.1°F | Resp 16 | Ht 64.0 in | Wt 161.4 lb

## 2017-08-01 DIAGNOSIS — N3000 Acute cystitis without hematuria: Secondary | ICD-10-CM

## 2017-08-01 MED ORDER — CEPHALEXIN 500 MG PO CAPS: 500.0000 mg | ORAL_CAPSULE | Freq: Two times a day (BID) | ORAL | 0 refills | Status: DC

## 2017-08-01 MED FILL — CEPHALEXIN 500 MG CAPSULE: 500 | 10 days supply | Qty: 20 | Fill #0

## 2017-08-01 NOTE — Progress Notes (Signed)
Patient ID: Debra Williamson, female    DOB: 02-14-1981, 36 y.o.   MRN: [id number]  PCP: Bing Neighbors, FNP  Chief Complaint  Patient presents with  . Urinary Frequency  . Dysuria    Subjective:  HPI Debra Williamson is a 36 y.o. female with sickle cell anemia , presents for evaluation of dysuria and urinary frequency. She complains of low back pain, odorous urine, cloudy urine for over 1 week. She denies associated fever , chills, nausea, or vomiting. She has not attempted relief with any other medication and reports hydrating well. Social History   Social History  . Marital status: Married    Spouse name: N/A  . Number of children: 4  . Years of education: N/A   Occupational History  . Doesn't work    Social History Main Topics  . Smoking status: Never Smoker  . Smokeless tobacco: Never Used  . Alcohol use No     Comment: occasionally  . Drug use: No  . Sexual activity: Yes   Other Topics Concern  . Not on file   Social History Narrative  . No narrative on file    Family History  Problem Relation Age of Onset  . HIV/AIDS Father    Review of Systems  See HPI   Patient Active Problem List   Diagnosis Date Noted  . TIA (transient ischemic attack)   . Vertigo 09/07/2016  . Dyspnea 09/07/2016  . Lightheadedness 09/06/2016  . Healthcare maintenance 07/30/2016  . Dizziness, nonspecific 07/30/2016  . Left foot drop 07/30/2016  . Left-sided weakness 07/30/2016  . 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 History of stroke associated with blood clotting tendency 01/02/2016  . [place] disease (HCC) 10/27/2015  . Hb-SS disease with acute chest syndrome (HCC) 08/14/2015  . Acute chest syndrome (HCC)   . Acute respiratory failure with hypoxemia (HCC)   . Community acquired pneumonia 08/08/2015  . Hemiparesis following  cerebrovascular accident (CVA) (HCC) 05/30/2015  . Ankle gives out 05/30/2015  . Seizure disorder (HCC) 05/30/2015  . 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 H/O: stroke 03/12/2015  . Seizures (HCC) 03/12/2015  . Sickle cell disease (HCC) 03/11/2015  . Sickle cell crisis (HCC) 03/11/2015  . Symptomatic anemia 03/11/2015    Allergies  Allergen Reactions  . Ibuprofen Hives  . Zofran [Ondansetron Hcl] Hives and Itching  . Demerol [Meperidine] Other (See Comments)    Pt has seizures  . Fentanyl And Related Nausea And Vomiting and Other (See Comments)    Very sedated  **Not allergic to the injection** JUST ALLERGIC TO PATCH  . Other Itching    Greek Yogurt  . Codeine Hives and Itching  . Hydrocodone Hives and Itching    Prior to Admission medications   Medication Sig Start Date End Date Taking? Authorizing Provider  albuterol (PROVENTIL HFA;VENTOLIN HFA) 108 (90 Base) MCG/ACT inhaler Inhale 2 puffs into the lungs every 4 (four) hours as needed for wheezing or shortness of breath (cough, shortness of breath or wheezing.). 02/07/17  Yes Bing Neighbors, FNP  Ascorbic Acid (VITAMIN C) 1000 MG tablet Take 1,000 mg by mouth every evening.   Yes [provider]  aspirin EC 81 MG tablet Take 1 tablet by mouth every evening.  11/03/13  Yes [provider]  diclofenac sodium (VOLTAREN) 1 % GEL Apply 2 g topically 4 (four) times daily. 07/30/16  Yes Jegede,  Olugbemiga E, MD  folic acid (FOLVITE) 1 MG tablet Take 1 mg by mouth daily.  02/13/15  Yes [provider]  lamoTRIgine (LAMICTAL) 200 MG tablet Take 1 tablet (200 mg total) by mouth 2 (two) times daily. 05/13/17  Yes Bing Neighbors, FNP  levETIRAcetam (KEPPRA) 1000 MG tablet Take 1 tablet (1,000 mg total) by mouth 2 (two) times daily. 05/13/17  Yes Bing Neighbors, FNP  neomycin-polymyxin-hydrocortisone (CORTISPORIN) OTIC solution Place 3 drops into the left ear 3 (three) times daily. 05/13/17  Yes  Bing Neighbors, FNP  omeprazole (PRILOSEC) 40 MG capsule Take 1 capsule (40 mg total) by mouth daily. 05/13/17  Yes Bing Neighbors, FNP  oxyCODONE (OXYCONTIN) 30 MG 12 hr tablet Take 30 mg by mouth every 12 (twelve) hours as needed. 07/14/17  Yes Bing Neighbors, FNP  Oxycodone HCl 10 MG TABS Take 1 tablet (10 mg total) by mouth every 6 (six) hours as needed. 07/17/17  Yes Bing Neighbors, FNP  promethazine (PHENERGAN) 25 MG tablet Take 1 tablet (25 mg total) by mouth every 8 (eight) hours as needed for nausea or vomiting. 1 tablet every 8 hours prn 07/14/17  Yes Bing Neighbors, FNP  zolpidem (AMBIEN) 5 MG tablet Take 1 tablet (5 mg total) by mouth at bedtime as needed. sleep 03/13/15  Yes Altha Harm, MD    Past Medical, Surgical Family and Social History reviewed and updated.    Objective:   Today's Vitals   08/01/17 0825  BP: 114/68  Pulse: 80  Resp: 16  Temp: 98.1 F (36.7 C)  TempSrc: Oral  SpO2: 97%  Weight: 161 lb 6.4 oz (73.2 kg)  Height:  (1.626 m)    Wt Readings from Last 3 Encounters:  08/01/17 161 lb 6.4 oz (73.2 kg)  07/22/17 160 lb (72.6 kg)  07/18/17 161 lb 6.4 oz (73.2 kg)   Physical Exam  Constitutional: She is oriented to person, place, and time. She appears well-developed and well-nourished.  HENT:  Head: Normocephalic and atraumatic.  Eyes: Pupils are equal, round, and reactive to light. Conjunctivae are normal.  Cardiovascular: Normal rate, regular rhythm, normal heart sounds and intact distal pulses.   Pulmonary/Chest: Effort normal and breath sounds normal.  Abdominal: Soft. Normal appearance. There is no CVA tenderness.  Musculoskeletal: Normal range of motion.  Neurological: She is alert and oriented to person, place, and time.  Skin: Skin is warm and dry.  Psychiatric: She has a normal mood and affect. Her behavior is normal. Judgment and thought content normal.   Assessment & Plan:  1. Acute cystitis without hematuria,  UA only significant for trace of leukocytes , negative for hematuria. Treating empirically for acute cystitis as patient is symptomatic. Start Keflex 500 mg, BID for 10 days .- Urine Culture, pending   -Patient given return precautions to follow-up here or report to the ED if she experiences fever, chills, nausea, vomiting or worsening low back pain as these symptoms could represent onset of pyelonephritis.  RTC: keep upcoming appointment schedule for 08/18/2017    Godfrey Pick. Tiburcio Pea, MSN, FNP-C [facility]  [address] [place], [place] [address] [phone]

## 2017-08-01 NOTE — Patient Instructions (Signed)

## 2017-08-03 LAB — URINE CULTURE
MICRO NUMBER:: 81140178
SPECIMEN QUALITY:: ADEQUATE

## 2017-08-05 MED FILL — PROMETHAZINE 25 MG TABLET: 25 | 10 days supply | Qty: 30 | Fill #2

## 2017-08-15 ENCOUNTER — Telehealth: Payer: Self-pay

## 2017-08-15 DIAGNOSIS — R110 Nausea: Secondary | ICD-10-CM

## 2017-08-17 MED ORDER — PROMETHAZINE HCL 25 MG PO TABS: 25.0000 mg | ORAL_TABLET | Freq: Three times a day (TID) | ORAL | 2 refills | Status: DC | PRN

## 2017-08-17 MED ORDER — OXYCODONE HCL 10 MG PO TABS: 10.0000 mg | ORAL_TABLET | Freq: Four times a day (QID) | ORAL | 0 refills | Status: DC | PRN

## 2017-08-17 MED ORDER — OXYCODONE HCL ER 30 MG PO T12A
30.0000 mg | EXTENDED_RELEASE_TABLET | Freq: Two times a day (BID) | ORAL | 0 refills | Status: DC | PRN
Start: 2017-08-17 — End: 2017-09-22

## 2017-08-17 NOTE — Telephone Encounter (Signed)
Refilled oxycodone qty# 60, oxy contin qty # 60, and phenergan.   Godfrey PickKimberly S. Tiburcio PeaHarris, MSN, FNP-C [facility]  [address] [place], [place] [address] [phone]

## 2017-08-18 ENCOUNTER — Ambulatory Visit: Payer: Medicaid Other | Admitting: Family Medicine

## 2017-08-19 ENCOUNTER — Other Ambulatory Visit: Payer: Self-pay | Admitting: Family Medicine

## 2017-08-19 ENCOUNTER — Telehealth: Payer: Self-pay

## 2017-08-19 ENCOUNTER — Ambulatory Visit (INDEPENDENT_AMBULATORY_CARE_PROVIDER_SITE_OTHER): Payer: Medicaid Other | Admitting: Family Medicine

## 2017-08-19 ENCOUNTER — Encounter: Payer: Self-pay | Admitting: Family Medicine

## 2017-08-19 VITALS — BP 120/70 | HR 80 | Temp 98.7°F | Ht 64.0 in | Wt 162.0 lb

## 2017-08-19 DIAGNOSIS — G894 Chronic pain syndrome: Secondary | ICD-10-CM

## 2017-08-19 DIAGNOSIS — D571 Sickle-cell disease without crisis: Secondary | ICD-10-CM | POA: Diagnosis not present

## 2017-08-19 LAB — POCT URINALYSIS DIP (DEVICE)
Glucose, UA: NEGATIVE mg/dL
Hgb urine dipstick: NEGATIVE
Ketones, ur: NEGATIVE mg/dL
Leukocytes, UA: NEGATIVE
Nitrite: NEGATIVE
Protein, ur: NEGATIVE mg/dL
Specific Gravity, Urine: 1.015 (ref 1.005–1.030)
Urobilinogen, UA: 1 mg/dL (ref 0.0–1.0)
pH: 5.5 (ref 5.0–8.0)

## 2017-08-19 MED ORDER — LIDOCAINE-PRILOCAINE 2.5-2.5 % EX CREA
1.0000 | TOPICAL_CREAM | CUTANEOUS | 3 refills | Status: DC | PRN
Start: 2017-08-19 — End: 2017-08-25

## 2017-08-19 MED FILL — OxyCONTIN 30 MG T12A: 30 | 30 days supply | Qty: 60 | Fill #0

## 2017-08-19 NOTE — Telephone Encounter (Signed)
Patient would like a refill on the  Lidocaine-prilocaine cream.

## 2017-08-19 NOTE — Progress Notes (Signed)
Patient ID: Debra Williamson, female    DOB: 23-Sep-1981, 36 y.o.   MRN: [id number]  PCP: Bing Neighbors, FNP  Chief Complaint  Patient presents with  . Follow-up    1 MONTH    Subjective:  HPI [name] is a 36 y.o. female with sickle cell anemia presents for a 1 month medication management follow-up. Indiya reports continuous intermittent sickle cell related pain. She reports that the pain is tolerable with current pain medication regimen and over the last month, she has had some pain-free days in which she did not need medication for pain. She suffers from chronic dyspnea which is exacerbated by activity.  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 Due to financial problems, she has been unable to pay to have her heat turned on. She is compliant with all preventative medication and receives apheresis [facility] at [facility]. She denies current chest pain, cough, fever, dysuria, or headache. Social History   Social History  . Marital status: Married    Spouse name: N/A  . Number of children: 4  . Years of education: N/A   Occupational History  . Doesn't work    Social History Main Topics  . Smoking status: Never Smoker  . Smokeless tobacco: Never Used  . Alcohol use No     Comment: occasionally  . Drug use: No  . Sexual activity: Yes   Other Topics Concern  . Not on file   Social History Narrative  . No narrative on file    Family History  Problem Relation Age of Onset  . HIV/AIDS Father    Review of Systems See HPI  Patient Active Problem List   Diagnosis Date Noted  . TIA (transient ischemic attack)   . Vertigo 09/07/2016  . Dyspnea 09/07/2016  . Lightheadedness 09/06/2016  . Healthcare maintenance 07/30/2016  . Dizziness, nonspecific 07/30/2016  . Left foot drop 07/30/2016  . Left-sided weakness 07/30/2016  . 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 History of stroke associated with blood clotting tendency 01/02/2016  . [place] disease (HCC) 10/27/2015  . Hb-SS disease with acute chest syndrome (HCC) 08/14/2015  . Acute chest syndrome (HCC)   . Acute respiratory failure with hypoxemia (HCC)   . Community acquired pneumonia 08/08/2015  . Hemiparesis following cerebrovascular accident (CVA) (HCC) 05/30/2015  . Ankle gives out 05/30/2015  . Seizure disorder (HCC) 05/30/2015  . 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 H/O: stroke 03/12/2015  . Seizures (HCC) 03/12/2015  . Sickle cell disease (HCC) 03/11/2015  . Sickle cell crisis (HCC) 03/11/2015  . Symptomatic anemia 03/11/2015    Allergies  Allergen Reactions  . Ibuprofen Hives  . Zofran [Ondansetron Hcl] Hives and Itching  . Demerol [Meperidine] Other (See Comments)    Pt has seizures  . Fentanyl And Related Nausea And Vomiting and Other (See Comments)    Very sedated  **Not allergic to the injection** JUST ALLERGIC TO PATCH  . Other Itching    Greek Yogurt  . Codeine Hives and Itching  . Hydrocodone Hives and Itching    Prior to Admission medications   Medication Sig Start Date End Date Taking? Authorizing Provider  albuterol (PROVENTIL HFA;VENTOLIN HFA) 108 (90 Base) MCG/ACT inhaler Inhale 2 puffs into the lungs every 4 (four) hours as needed for wheezing  or shortness of breath (cough, shortness of breath or wheezing.). 02/07/17  Yes Bing Neighbors, FNP  Ascorbic Acid (VITAMIN C) 1000 MG tablet Take 1,000 mg by mouth every evening.   Yes [provider]  aspirin EC 81 MG tablet Take 1 tablet by mouth every evening.  11/03/13  Yes [provider]  cephALEXin (KEFLEX) 500 MG capsule Take 1 capsule (500 mg total) by mouth 2 (two) times daily. 08/01/17  Yes Bing Neighbors, FNP  diclofenac sodium (VOLTAREN) 1 %  GEL Apply 2 g topically 4 (four) times daily. 07/30/16  Yes Quentin Angst, MD  folic acid (FOLVITE) 1 MG tablet Take 1 mg by mouth daily.  02/13/15  Yes [provider]  lamoTRIgine (LAMICTAL) 200 MG tablet Take 1 tablet (200 mg total) by mouth 2 (two) times daily. 05/13/17  Yes Bing Neighbors, FNP  levETIRAcetam (KEPPRA) 1000 MG tablet Take 1 tablet (1,000 mg total) by mouth 2 (two) times daily. 05/13/17  Yes Bing Neighbors, FNP  neomycin-polymyxin-hydrocortisone (CORTISPORIN) OTIC solution Place 3 drops into the left ear 3 (three) times daily. 05/13/17  Yes Bing Neighbors, FNP  omeprazole (PRILOSEC) 40 MG capsule Take 1 capsule (40 mg total) by mouth daily. 05/13/17  Yes Bing Neighbors, FNP  oxyCODONE (OXYCONTIN) 30 MG 12 hr tablet Take 1 tablet (30 mg total) by mouth every 12 (twelve) hours as needed. 08/17/17  Yes Bing Neighbors, FNP  Oxycodone HCl 10 MG TABS Take 1 tablet (10 mg total) by mouth every 6 (six) hours as needed. 08/17/17  Yes Bing Neighbors, FNP  promethazine (PHENERGAN) 25 MG tablet Take 1 tablet (25 mg total) by mouth every 8 (eight) hours as needed for nausea or vomiting. 1 tablet every 8 hours prn 08/17/17  Yes Bing Neighbors, FNP  zolpidem (AMBIEN) 5 MG tablet Take 1 tablet (5 mg total) by mouth at bedtime as needed. sleep 03/13/15  Yes Altha Harm, MD    Past Medical, Surgical Family and Social History reviewed and updated.    Objective:   Today's Vitals   08/19/17 0831  BP: 120/70  Pulse: 80  Temp: 98.7 F (37.1 C)  TempSrc: Oral  SpO2: 100%  Weight: 162 lb (73.5 kg)  Height: 5\' 4"  (1.626 m)    Wt Readings from Last 3 Encounters:  08/19/17 162 lb (73.5 kg)  08/01/17 161 lb 6.4 oz (73.2 kg)  07/22/17 160 lb (72.6 kg)    Physical Exam Constitutional: She is oriented to person, place, and time.  HENT:  Head: Normocephalic and atraumatic.  Eyes: Pupils are equal, round, and reactive to light. Conjunctivae are  normal.  Neck: Normal range of motion. Neck supple.  Cardiovascular: Normal rate, regular rhythm, normal heart sounds and intact distal pulses.   Pulmonary/Chest: Effort normal and breath sounds normal.  Abdominal: Soft. Bowel sounds are normal.  Musculoskeletal: She exhibits deformity.  Left sided weakness   Neurological: She is alert and oriented to person, place, and time.  Skin: Skin is warm and dry.  Psychiatric: She has a normal mood and affect. Her behavior is normal. Judgment normal  Assessment & Plan:  1. Hb-SS disease without crisis (HCC) -Continue monthly Apheresis treatment at [facility] hematology clinic    Pulmonary evaluation - Patient denies severe recurrent wheezes, shortness of breath with exercise, or persistent cough. If these symptoms develop, pulmonary function tests with spirometry will be ordered, and if abnormal, plan on referral to Pulmonology for further  evaluation.  Eye - High risk of proliferative retinopathy. Annual eye exam with retinal exam recommended to patient, the patient has had eye exam this year.  Immunization status - Yearly influenza vaccination is recommended, as well as being up to date with Meningococcal and Pneumococcal vaccines.   2. Chronic pain syndrome No changes to current regimen. Continue oxycodone 10 mg, every 6 hours as needed for moderate to severe pain. Take OxyContin 30 mg every 12 hours to control painful episodes from occurring.   Acute and chronic painful episodes - We agreed on Opiate dose and amount of pills  per month. We discussed that pt is to receive Schedule II prescriptions only from our clinic. Pt is also aware that the prescription history is available to us [place] through the [facility]. Controlled substance agreement reviewed and signed. We reminded  .Gloriajean DellShanequa Rivadeneira that all patients receiving Schedule II narcotics must be seen for follow within one month of prescription being requested. We reviewed the terms of our pain  agreement, including the need to keep medicines in a safe locked location away from children or pets, and the need to report excess sedation or constipation, measures to avoid constipation, and policies related to early refills and stolen prescriptions. According to the [place] Chronic [facility] program, we have reviewed details related to analgesia, adverse effects and aberrant behaviors.    Return in about 4 weeks for Sickle Cell Disease/Pain.  The patient was given clear instructions to go to ER or return to medical center if symptoms don't improve, worsen or new problems develop. The patient verbalized understanding. The patient was told to call to get lab results if they haven't heard anything in the next week.     Godfrey PickKimberly S. Tiburcio PeaHarris, MSN, FNP-C [facility]  [address] [place], [place] [address] [phone]

## 2017-08-19 NOTE — Progress Notes (Signed)
Refilled Lidocaine-prilocaine cream

## 2017-08-20 NOTE — Telephone Encounter (Signed)
Submit note date 08/19/2017 with PA.   Thanks,   Godfrey PickKimberly S. Tiburcio PeaHarris, MSN, FNP-C [facility]  [address] [place], [place] [address] [phone]

## 2017-08-20 NOTE — Telephone Encounter (Signed)
Per medicaid notes and plan of care needs to be submitted with a PA for patients Oxycodone.

## 2017-08-21 NOTE — Telephone Encounter (Signed)
Paperwork will be submitted on Friday once provider is back in office to sign paperwork 

## 2017-08-25 ENCOUNTER — Telehealth: Payer: Self-pay

## 2017-08-25 MED ORDER — LIDOCAINE-PRILOCAINE 2.5-2.5 % EX CREA: 1.0000 "application " | TOPICAL_CREAM | CUTANEOUS | 3 refills | Status: DC | PRN

## 2017-08-25 MED FILL — LIDOCAINE-PRILOCAINE CREAM: 2.5-2.5 | 10 days supply | Qty: 30 | Fill #0

## 2017-08-25 MED FILL — oxyCODONE HCL 10 MG TABS: 10 | 15 days supply | Qty: 60 | Fill #0

## 2017-08-25 NOTE — Telephone Encounter (Signed)
PA was resubmitted with the plan of care to [place] [facility] on 08/25/2017. [facility] was [facility].

## 2017-08-26 NOTE — Telephone Encounter (Signed)
PA was approved and pharmacy was notified.

## 2017-09-15 MED FILL — lamoTRIgine 200 MG TABS: 200 | 30 days supply | Qty: 60 | Fill #2

## 2017-09-15 MED FILL — OMEPRAZOLE DR 40 MG CAPSULE: 40 | 30 days supply | Qty: 30 | Fill #1

## 2017-09-15 MED FILL — levETIRAcetam 1000 MG TABS: 1000 | 30 days supply | Qty: 60 | Fill #2

## 2017-09-19 ENCOUNTER — Ambulatory Visit: Payer: Medicaid Other | Admitting: Family Medicine

## 2017-09-22 ENCOUNTER — Encounter: Payer: Self-pay | Admitting: Family Medicine

## 2017-09-22 ENCOUNTER — Ambulatory Visit (INDEPENDENT_AMBULATORY_CARE_PROVIDER_SITE_OTHER): Payer: Medicaid Other | Admitting: Family Medicine

## 2017-09-22 VITALS — BP 119/60 | HR 84 | Temp 98.1°F | Resp 16 | Ht 64.0 in | Wt 158.0 lb

## 2017-09-22 DIAGNOSIS — D571 Sickle-cell disease without crisis: Secondary | ICD-10-CM | POA: Diagnosis not present

## 2017-09-22 DIAGNOSIS — R0600 Dyspnea, unspecified: Secondary | ICD-10-CM

## 2017-09-22 DIAGNOSIS — R110 Nausea: Secondary | ICD-10-CM

## 2017-09-22 DIAGNOSIS — F1190 Opioid use, unspecified, uncomplicated: Secondary | ICD-10-CM | POA: Diagnosis not present

## 2017-09-22 DIAGNOSIS — G894 Chronic pain syndrome: Secondary | ICD-10-CM | POA: Diagnosis not present

## 2017-09-22 LAB — POCT URINALYSIS DIP (DEVICE)
Bilirubin Urine: NEGATIVE
Glucose, UA: NEGATIVE mg/dL
Hgb urine dipstick: NEGATIVE
Ketones, ur: NEGATIVE mg/dL
Leukocytes, UA: NEGATIVE
Nitrite: NEGATIVE
Protein, ur: NEGATIVE mg/dL
Specific Gravity, Urine: 1.015 (ref 1.005–1.030)
Urobilinogen, UA: 0.2 mg/dL (ref 0.0–1.0)
pH: 5.5 (ref 5.0–8.0)

## 2017-09-22 MED ORDER — OXYCODONE HCL 10 MG PO TABS: 10.0000 mg | ORAL_TABLET | Freq: Four times a day (QID) | ORAL | 0 refills | Status: DC | PRN

## 2017-09-22 MED ORDER — OXYCODONE HCL ER 30 MG PO T12A: 30.0000 mg | EXTENDED_RELEASE_TABLET | Freq: Two times a day (BID) | ORAL | 0 refills | Status: DC | PRN

## 2017-09-22 MED ORDER — PROMETHAZINE HCL 25 MG PO TABS: 25.0000 mg | ORAL_TABLET | Freq: Three times a day (TID) | ORAL | 2 refills | Status: DC | PRN

## 2017-09-22 MED FILL — PROMETHAZINE 25 MG TABLET: 25 | 10 days supply | Qty: 30 | Fill #0

## 2017-09-22 MED FILL — oxyCODONE HCL 10 MG TABS: 10 | 15 days supply | Qty: 60 | Fill #0

## 2017-09-22 MED FILL — OxyCONTIN 30 MG T12A: 30 | 30 days supply | Qty: 60 | Fill #0

## 2017-09-22 NOTE — Patient Instructions (Signed)
Shortness of Breath, Adult Shortness of breath means you have trouble breathing. Your lungs are organs for breathing. Follow these instructions at home: Pay attention to any changes in your symptoms. Take these actions to help with your condition:  Do not smoke. Smoking can cause shortness of breath. If you need help to quit smoking, ask your doctor.  Avoid things that can make it harder to breathe, such as: ? Mold. ? Dust. ? Air pollution. ? Chemical smells. ? Things that can cause allergy symptoms (allergens), if you have allergies.  Keep your living space clean and free of mold and dust.  Rest as needed. Slowly return to your usual activities.  Take over-the-counter and prescription medicines, including oxygen and inhaled medicines, only as told by your doctor.  Keep all follow-up visits as told by your doctor. This is important.  Contact a doctor if:  Your condition does not get better as soon as expected.  You have a hard time doing your normal activities, even after you rest.  You have new symptoms. Get help right away if:  You have trouble breathing when you are resting.  You feel light-headed or you faint.  You have a cough that is not helped by medicines.  You cough up blood.  You have pain with breathing.  You have pain in your chest, arms, shoulders, or belly (abdomen).  You have a fever.  You cannot walk up stairs.  You cannot exercise the way you normally do. This information is not intended to replace advice given to you by your health care provider. Make sure you discuss any questions you have with your health care provider. Document Released: 03/25/2008 Document Revised: 10/24/2016 Document Reviewed: 10/24/2016 Elsevier Interactive Patient Education  2017 [employer] Chronic Pain, Adult Chronic pain is a type of pain that lasts or keeps coming back (recurs) for at least six months. You may have chronic headaches, abdominal pain, or body pain.  Chronic pain may be related to an illness, such as fibromyalgia or complex regional pain syndrome. Sometimes the cause of chronic pain is not known. Chronic pain can make it hard for you to do daily activities. If not treated, chronic pain can lead to other health problems, including anxiety and depression. Treatment depends on the cause and severity of your pain. You may need to work with a pain specialist to come up with a treatment plan. The plan may include medicine, counseling, and physical therapy. Many people benefit from a combination of two or more types of treatment to control their pain. Follow these instructions at home: Lifestyle  Consider keeping a pain diary to share with your health care providers.  Consider talking with a mental health care provider (psychologist) about how to cope with chronic pain.  Consider joining a chronic pain support group.  Try to control or lower your stress levels. Talk to your health care provider about strategies to do this. General instructions   Take over-the-counter and prescription medicines only as told by your health care provider.  Follow your treatment plan as told by your health care provider. This may include: ? Gentle, regular exercise. ? Eating a healthy diet that includes foods such as vegetables, fruits, fish, and lean meats. ? Cognitive or behavioral therapy. ? Working with a Adult nursephysical therapist. ? Meditation or yoga. ? Acupuncture or massage therapy. ? Aroma, color, light, or sound therapy. ? Local electrical stimulation. ? Shots (injections) of numbing or pain-relieving medicines into the spine or the area of  pain.  Check your pain level as told by your health care provider. Ask your health care provider if you should use a pain scale.  Learn as much as you can about how to manage your chronic pain. Ask your health care provider if an intensive pain rehabilitation program or a chronic pain specialist would be helpful.  Keep  all follow-up visits as told by your health care provider. This is important. Contact a health care provider if:  Your pain gets worse.  You have new pain.  You have trouble sleeping.  You have trouble doing your normal activities.  Your pain is not controlled with treatment.  Your have side effects from pain medicine.  You feel weak. Get help right away if:  You lose feeling or have numbness in your body.  You lose control of bowel or bladder function.  Your pain suddenly gets much worse.  You develop shaking or chills.  You develop confusion.  You develop chest pain.  You have trouble breathing or shortness of breath.  You pass out.  You have thoughts about hurting yourself or others. This information is not intended to replace advice given to you by your health care provider. Make sure you discuss any questions you have with your health care provider. Document Released: 06/29/2002 Document Revised: 06/06/2016 Document Reviewed: 03/26/2016 Elsevier Interactive Patient Education  2017 [employer]. Sickle Cell Anemia, Adult Sickle cell anemia is a condition where your red blood cells are shaped like sickles. Red blood cells carry oxygen through the body. Sickle-shaped red blood cells do not live as long as normal red blood cells. They also clump together and block blood from flowing through the blood vessels. These things prevent the body from getting enough oxygen. Sickle cell anemia causes organ damage and pain. It also increases the risk of infection. Follow these instructions at home:  Drink enough fluid to keep your pee (urine) clear or pale yellow. Drink more in hot weather and during exercise.  Do not smoke. Smoking lowers oxygen levels in the blood.  Only take over-the-counter or prescription medicines as told by your doctor.  Take antibiotic medicines as told by your doctor. Make sure you finish them even if you start to feel better.  Take supplements as  told by your doctor.  Consider wearing a medical alert bracelet. This tells anyone caring for you in an emergency of your condition.  When traveling, keep your medical information, doctors' names, and the medicines you take with you at all times.  If you have a fever, do not take fever medicines right away. This could cover up a problem. Tell your doctor.  Keep all follow-up visits with your doctor. Sickle cell anemia requires regular medical care. Contact a doctor if: You have a fever. Get help right away if:  You feel dizzy or faint.  You have new belly (abdominal) pain, especially on the left side near the stomach area.  You have a lasting, often uncomfortable and painful erection of the penis (priapism). If it is not treated right away, you will become unable to have sex (impotence).  You have numbness in your arms or legs or you have a hard time moving them.  You have a hard time talking.  You have a fever or lasting symptoms for more than 2-3 days.  You have a fever and your symptoms suddenly get worse.  You have signs or symptoms of infection. These include: ? Chills. ? Being more tired than normal (lethargy). ?  Irritability. ? Poor eating. ? Throwing up (vomiting).  You have pain that is not helped with medicine.  You have shortness of breath.  You have pain in your chest.  You are coughing up pus-like or bloody mucus.  You have a stiff neck.  Your feet or hands swell or have pain.  Your belly looks bloated.  Your joints hurt. This information is not intended to replace advice given to you by your health care provider. Make sure you discuss any questions you have with your health care provider. Document Released: 07/28/2013 Document Revised: 03/14/2016 Document Reviewed: 05/19/2013 Elsevier Interactive Patient Education  2017 [employer].

## 2017-09-22 NOTE — Progress Notes (Signed)
Patient ID: Debra Williamson, female    DOB: [birth year], 36 y.o.   MRN: [id number]  PCP: Bing NeighborsHarris, Diantha Paxson S, FNP  Chief Complaint  Patient presents with  . Follow-up    1 month    Subjective:  HPI Debra Williamson is a 36 y.o. female with sickle cell anemia, presents for evaluation one month follow-up and medication management. She continues to struggle with chronic, on-going pain, with fluctuating levels of pain. Current pain intensity 6/10. Reports relief with prescribed opioid pain medication, although she takes medication only when pain is intolerable. She [facility] where she received monthly apheresis treatments. She is compliant with all preventative medication and receives [facility] for adjunctive treatment of SCD. She denies current chest pain, cough, fever, dysuria, or headache. Continues to complain of shortness of breath although notes improvement of symptoms with use of albuterol inhaler. Last ECHO 05/01/2017 which was unremarkable. Social History   Socioeconomic History  . Marital status: Married    Spouse name: Not on file  . Number of children: 4  . Years of education: Not on file  . Highest education level: Not on file  Social Needs  . Financial resource strain: Not on file  . Food insecurity - worry: Not on file  . Food insecurity - inability: Not on file  . Transportation needs - medical: Not on file  . Transportation needs - non-medical: Not on file  Occupational History  . Occupation: Doesn't work  Tobacco Use  . Smoking status: Never Smoker  . Smokeless tobacco: Never Used  Substance and Sexual Activity  . Alcohol use: No    Alcohol/week: 0.0 oz    Comment: occasionally  . Drug use: No  . Sexual activity: Yes  Other Topics Concern  . Not on file  Social History Narrative  . Not on file    Family History  Problem Relation Age of Onset  . HIV/AIDS Father    Review of  Systems Constitutional: Negative.   HENT: Negative.   Respiratory: Positive for shortness of breath and wheezing.   Cardiovascular: Negative.   Skin: Negative.  Musculoskeletal: arthralgia  Neurological: Negative for dizziness, light-headedness and headaches.  Psychiatric/Behavioral: Negative.   Patient Active Problem List   Diagnosis Date Noted  . TIA (transient ischemic attack)   . Vertigo 09/07/2016  . Dyspnea 09/07/2016  . Lightheadedness 09/06/2016  . Healthcare maintenance 07/30/2016  . Dizziness, nonspecific 07/30/2016  . Left foot drop 07/30/2016  . Left-sided weakness 07/30/2016  . 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 History of stroke associated with blood clotting tendency 01/02/2016  . [place] disease (HCC) 10/27/2015  . Hb-SS disease with acute chest syndrome (HCC) 08/14/2015  . Acute chest syndrome (HCC)   . Acute respiratory failure with hypoxemia (HCC)   . Community acquired pneumonia 08/08/2015  . Hemiparesis following cerebrovascular accident (CVA) (HCC) 05/30/2015  . Ankle gives out 05/30/2015  . Seizure disorder (HCC) 05/30/2015  . 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 H/O: stroke 03/12/2015  . Seizures (HCC) 03/12/2015  . Sickle cell disease (HCC) 03/11/2015  . Sickle cell crisis (HCC) 03/11/2015  . Symptomatic anemia 03/11/2015    Allergies  Allergen Reactions  . Ibuprofen Hives  . Zofran [Ondansetron Hcl] Hives and Itching  . Demerol [Meperidine] Other (See Comments)    Pt has seizures  . Fentanyl  And Related Nausea And Vomiting and Other (See Comments)    Very sedated  **Not allergic to the injection** JUST ALLERGIC TO PATCH  . Other Itching    Greek Yogurt  . Codeine Hives and Itching  . Hydrocodone Hives and Itching    Prior to Admission medications   Medication Sig Start Date End Date  Taking? Authorizing Provider  albuterol (PROVENTIL HFA;VENTOLIN HFA) 108 (90 Base) MCG/ACT inhaler Inhale 2 puffs into the lungs every 4 (four) hours as needed for wheezing or shortness of breath (cough, shortness of breath or wheezing.). 02/07/17  Yes Bing Neighbors, FNP  Ascorbic Acid (VITAMIN C) 1000 MG tablet Take 1,000 mg by mouth every evening.   Yes [provider]  aspirin EC 81 MG tablet Take 1 tablet by mouth every evening.  11/03/13  Yes [provider]  cephALEXin (KEFLEX) 500 MG capsule Take 1 capsule (500 mg total) by mouth 2 (two) times daily. 08/01/17  Yes Bing Neighbors, FNP  diclofenac sodium (VOLTAREN) 1 % GEL Apply 2 g topically 4 (four) times daily. 07/30/16  Yes Quentin Angst, MD  folic acid (FOLVITE) 1 MG tablet Take 1 mg by mouth daily.  02/13/15  Yes [provider]  lamoTRIgine (LAMICTAL) 200 MG tablet Take 1 tablet (200 mg total) by mouth 2 (two) times daily. 05/13/17  Yes Bing Neighbors, FNP  levETIRAcetam (KEPPRA) 1000 MG tablet Take 1 tablet (1,000 mg total) by mouth 2 (two) times daily. 05/13/17  Yes Bing Neighbors, FNP  lidocaine-prilocaine (EMLA) cream Apply 1 application as needed topically. 08/25/17  Yes Bing Neighbors, FNP  neomycin-polymyxin-hydrocortisone (CORTISPORIN) OTIC solution Place 3 drops into the left ear 3 (three) times daily. 05/13/17  Yes Bing Neighbors, FNP  omeprazole (PRILOSEC) 40 MG capsule Take 1 capsule (40 mg total) by mouth daily. 05/13/17  Yes Bing Neighbors, FNP  oxyCODONE (OXYCONTIN) 30 MG 12 hr tablet Take 1 tablet (30 mg total) by mouth every 12 (twelve) hours as needed. 08/17/17  Yes Bing Neighbors, FNP  Oxycodone HCl 10 MG TABS Take 1 tablet (10 mg total) by mouth every 6 (six) hours as needed. 08/17/17  Yes Bing Neighbors, FNP  promethazine (PHENERGAN) 25 MG tablet Take 1 tablet (25 mg total) by mouth every 8 (eight) hours as needed for nausea or vomiting. 1 tablet every 8  hours prn 08/17/17  Yes Bing Neighbors, FNP  zolpidem (AMBIEN) 5 MG tablet Take 1 tablet (5 mg total) by mouth at bedtime as needed. sleep 03/13/15  Yes Altha Harm, MD   Past Medical, Surgical Family and Social History reviewed and updated.   Objective:   Today's Vitals   09/22/17 0818  BP: 119/60  Pulse: 84  Resp: 16  Temp: 98.1 F (36.7 C)  TempSrc: Oral  SpO2: 97%  Weight: 158 lb (71.7 kg)  Height: 5\' 4"  (1.626 m)    Wt Readings from Last 3 Encounters:  09/22/17 158 lb (71.7 kg)  08/19/17 162 lb (73.5 kg)  08/01/17 161 lb 6.4 oz (73.2 kg)   Physical Exam Constitutional: She is oriented to person, place, and time. She appears well-developed and well-nourished.  HENT:  Head: Normocephalic and atraumatic.  Eyes: Pupils are equal, round, and reactive to light. Conjunctivae and EOM are normal.  Neck: Normal range of motion. Neck supple.  Cardiovascular: Normal rate, regular rhythm, normal heart sounds and intact distal pulses.   Pulmonary/Chest: Effort normal and breath sounds normal.  Lymphadenopathy:    She has no cervical adenopathy.  Neurological: She is alert and oriented to person, place, and time.  Psychiatric: She has a normal mood and affect. Her behavior is normal. Judgment and thought content normal.     Assessment & Plan:  1. Hb-SS disease without crisis (HCC), stable, [facility]  Pulmonary evaluation -Patient denies severe recurrent wheezes, shortness of breath with exercise, or persistent cough. If these symptoms develop, pulmonary function tests with spirometry will be ordered, and if abnormal, plan on referral to Pulmonology for further evaluation.  Eye -High risk of proliferative retinopathy. Annual eye exam with retinal exam recommended to patient, the patient has had eye exam this year.  Immunization status -Yearly influenza vaccination is recommended, as well as being up to date with Meningococcal  and Pneumococcal vaccines.   2. Chronic pain syndrome, continue long and short-acting opioid pain medication regimen. Educated patient on the importance of using long-acting medication initially and taking short-acting only as needed.  3. Chronic, continuous use of opioids Acute and chronic painful episodes - We agreed on Opiate dose and amount of pillsper month.We discussed that ptis to receive Schedule II prescriptions only from our clinic. Ptis also aware that the prescription history is available to us online through the PMP AWARE registry.  Controlled substance agreementreviewed andsigned. We reminded Debra SpiceShanequa Johnsonthat all patients receiving Schedule II narcotics must be seen for followwithin one month of prescription being requested.We reviewed the terms of our pain agreement, including the need to keep medicines in a safe locked location away from children or pets, and the need to report excess sedation or constipation, measures to avoid constipation, and policies related to early refills and stolen prescriptions. According to the [place] Chronic Pain Initiative program, we have reviewed details related to analgesia, adverse effectsand aberrant behaviors.  4. Dyspnea, persistent, chronic , recent ECHO 04/2017, unremarkable. Symptoms improvement obtained with use of Albuterol inhaler. No recent PFT on-file. Will refer patient to pulmonology for second opinion regarding treatment of on-going symptoms of dyspnea.    Meds ordered this encounter  Medications  . Oxycodone HCl 10 MG TABS    Sig: Take 1 tablet (10 mg total) by mouth every 6 (six) hours as needed.    Dispense:  60 tablet    Refill:  0    Ok to fill RX today    Order Specific Question:   Supervising Provider    Answer:   Quentin AngstJEGEDE, OLUGBEMIGA E [id number]]  . oxyCODONE (OXYCONTIN) 30 MG 12 hr tablet    Sig: Take 1 tablet (30 mg total) by mouth every 12 (twelve) hours as needed.    Dispense:  60 each    Refill:  0    Ok RX  fill today    Order Specific Question:   Supervising Provider    Answer:   Quentin AngstJEGEDE, OLUGBEMIGA E [id number]]  . promethazine (PHENERGAN) 25 MG tablet    Sig: Take 1 tablet (25 mg total) by mouth every 8 (eight) hours as needed for nausea or vomiting. 1 tablet every 8 hours prn    Dispense:  30 tablet    Refill:  2    Order Specific Question:   Supervising Provider    Answer:   Quentin AngstJEGEDE, OLUGBEMIGA E [[id number]]    RTC: 1 month for chronic conditions management and pain medication management    Godfrey PickKimberly S. Tiburcio PeaHarris, MSN, FNP-C [facility]  [address] [place],  [place] [address] [phone]

## 2017-09-28 ENCOUNTER — Telehealth: Payer: Self-pay | Admitting: Family Medicine

## 2017-09-28 NOTE — Telephone Encounter (Signed)
Please contact patient to advise I have placed a referral for her to see a pulmonologist regarding shortness of breath and to obtain pulmonary function testing.  Debra PickKimberly S. Williamson PeaHarris, MSN, FNP-C [facility]  [address] [place], [place] [address] [phone]

## 2017-10-01 NOTE — Telephone Encounter (Signed)
Tried to contact patient no answer and no vm set up 

## 2017-10-02 MED FILL — LIDOCAINE-PRILOCAINE CREAM: 2.5-2.5 | 10 days supply | Qty: 30 | Fill #1

## 2017-10-02 NOTE — Telephone Encounter (Signed)
Tried to contact patient again no answer and no vm set up

## 2017-10-07 NOTE — Telephone Encounter (Signed)
Tried to contact patient no answer. Letter will be sent out for patient to contact office.

## 2017-10-22 ENCOUNTER — Ambulatory Visit: Payer: Medicaid Other | Admitting: Family Medicine

## 2017-10-22 ENCOUNTER — Other Ambulatory Visit: Payer: Self-pay | Admitting: Family Medicine

## 2017-10-22 DIAGNOSIS — F1190 Opioid use, unspecified, uncomplicated: Secondary | ICD-10-CM

## 2017-10-22 MED ORDER — OXYCODONE HCL ER 30 MG PO T12A: 30.0000 mg | EXTENDED_RELEASE_TABLET | Freq: Two times a day (BID) | ORAL | 0 refills | Status: DC | PRN

## 2017-10-22 MED ORDER — OXYCODONE HCL 10 MG PO TABS: 10.0000 mg | ORAL_TABLET | Freq: Four times a day (QID) | ORAL | 0 refills | Status: DC | PRN

## 2017-10-22 NOTE — Progress Notes (Signed)
Refilled prescription for OxyContin and oxycodone.

## 2017-10-24 ENCOUNTER — Ambulatory Visit: Payer: Medicaid Other | Admitting: Family Medicine

## 2017-10-27 ENCOUNTER — Encounter: Payer: Self-pay | Admitting: Family Medicine

## 2017-10-27 ENCOUNTER — Ambulatory Visit (INDEPENDENT_AMBULATORY_CARE_PROVIDER_SITE_OTHER): Payer: Medicaid Other | Admitting: Family Medicine

## 2017-10-27 VITALS — BP 106/60 | HR 90 | Temp 98.5°F | Resp 14 | Ht 64.0 in | Wt 162.4 lb

## 2017-10-27 DIAGNOSIS — D571 Sickle-cell disease without crisis: Secondary | ICD-10-CM

## 2017-10-27 DIAGNOSIS — N898 Other specified noninflammatory disorders of vagina: Secondary | ICD-10-CM

## 2017-10-27 DIAGNOSIS — R0609 Other forms of dyspnea: Secondary | ICD-10-CM

## 2017-10-27 DIAGNOSIS — R110 Nausea: Secondary | ICD-10-CM

## 2017-10-27 DIAGNOSIS — R768 Other specified abnormal immunological findings in serum: Secondary | ICD-10-CM | POA: Diagnosis not present

## 2017-10-27 DIAGNOSIS — F1190 Opioid use, unspecified, uncomplicated: Secondary | ICD-10-CM

## 2017-10-27 LAB — POCT URINALYSIS DIP (DEVICE)
Bilirubin Urine: NEGATIVE
Glucose, UA: NEGATIVE mg/dL
Hgb urine dipstick: NEGATIVE
Ketones, ur: NEGATIVE mg/dL
Leukocytes, UA: NEGATIVE
Nitrite: NEGATIVE
Protein, ur: NEGATIVE mg/dL
Specific Gravity, Urine: 1.02 (ref 1.005–1.030)
Urobilinogen, UA: 0.2 mg/dL (ref 0.0–1.0)
pH: 5.5 (ref 5.0–8.0)

## 2017-10-27 MED ORDER — PROMETHAZINE HCL 25 MG PO TABS
25.0000 mg | ORAL_TABLET | Freq: Three times a day (TID) | ORAL | 2 refills | Status: DC | PRN
Start: 2017-10-27 — End: 2017-11-25

## 2017-10-27 MED ORDER — HYDROXYZINE PAMOATE 25 MG PO CAPS: 25.0000 mg | ORAL_CAPSULE | Freq: Four times a day (QID) | ORAL | 1 refills | Status: DC | PRN

## 2017-10-27 MED FILL — oxyCODONE HCL 10 MG TABS: 10 | 15 days supply | Qty: 60 | Fill #0

## 2017-10-27 MED FILL — HYDROXYZINE PAM 25 MG CAP: 25 | 15 days supply | Qty: 60 | Fill #0

## 2017-10-27 MED FILL — OxyCONTIN 30 MG T12A: 30 | 30 days supply | Qty: 60 | Fill #0

## 2017-10-27 MED FILL — PROMETHAZINE 25 MG TABLET: 25 | 10 days supply | Qty: 30 | Fill #0

## 2017-10-27 NOTE — Patient Instructions (Signed)
You will be notified by phone of the results of your vaginal swab.     Chronic Pain, Adult Chronic pain is a type of pain that lasts or keeps coming back (recurs) for at least six months. You may have chronic headaches, abdominal pain, or body pain. Chronic pain may be related to an illness, such as fibromyalgia or complex regional pain syndrome. Sometimes the cause of chronic pain is not known. Chronic pain can make it hard for you to do daily activities. If not treated, chronic pain can lead to other health problems, including anxiety and depression. Treatment depends on the cause and severity of your pain. You may need to work with a pain specialist to come up with a treatment plan. The plan may include medicine, counseling, and physical therapy. Many people benefit from a combination of two or more types of treatment to control their pain. Follow these instructions at home: Lifestyle  Consider keeping a pain diary to share with your health care providers.  Consider talking with a mental health care provider (psychologist) about how to cope with chronic pain.  Consider joining a chronic pain support group.  Try to control or lower your stress levels. Talk to your health care provider about strategies to do this. General instructions   Take over-the-counter and prescription medicines only as told by your health care provider.  Follow your treatment plan as told by your health care provider. This may include: ? Gentle, regular exercise. ? Eating a healthy diet that includes foods such as vegetables, fruits, fish, and lean meats. ? Cognitive or behavioral therapy. ? Working with a Adult nurse. ? Meditation or yoga. ? Acupuncture or massage therapy. ? Aroma, color, light, or sound therapy. ? Local electrical stimulation. ? Shots (injections) of numbing or pain-relieving medicines into the spine or the area of pain.  Check your pain level as told by your health care provider.  Ask your health care provider if you should use a pain scale.  Learn as much as you can about how to manage your chronic pain. Ask your health care provider if an intensive pain rehabilitation program or a chronic pain specialist would be helpful.  Keep all follow-up visits as told by your health care provider. This is important. Contact a health care provider if:  Your pain gets worse.  You have new pain.  You have trouble sleeping.  You have trouble doing your normal activities.  Your pain is not controlled with treatment.  Your have side effects from pain medicine.  You feel weak. Get help right away if:  You lose feeling or have numbness in your body.  You lose control of bowel or bladder function.  Your pain suddenly gets much worse.  You develop shaking or chills.  You develop confusion.  You develop chest pain.  You have trouble breathing or shortness of breath.  You pass out.  You have thoughts about hurting yourself or others. This information is not intended to replace advice given to you by your health care provider. Make sure you discuss any questions you have with your health care provider. Document Released: 06/29/2002 Document Revised: 06/06/2016 Document Reviewed: 03/26/2016 Elsevier Interactive Patient Education  2018 [employer].  Vaginitis Vaginitis is a condition in which the vaginal tissue swells and becomes red (inflamed). This condition is most often caused by a change in the normal balance of bacteria and yeast that live in the vagina. This change causes an overgrowth of certain bacteria or yeast,  which causes the inflammation. There are different types of vaginitis, but the most common types are:  Bacterial vaginosis.  Yeast infection (candidiasis).  Trichomoniasis vaginitis. This is a sexually transmitted disease (STD).  Viral vaginitis.  Atrophic vaginitis.  Allergic vaginitis.  What are the causes? The cause of this condition  depends on the type of vaginitis. It can be caused by:  Bacteria (bacterial vaginosis).  Yeast, which is a fungus (yeast infection).  A parasite (trichomoniasis vaginitis).  A virus (viral vaginitis).  Low hormone levels (atrophic vaginitis). Low hormone levels can occur during pregnancy, breastfeeding, or after menopause.  Irritants, such as bubble baths, scented tampons, and feminine sprays (allergic vaginitis).  Other factors can change the normal balance of the yeast and bacteria that live in the vagina. These include:  Antibiotic medicines.  Poor hygiene.  Diaphragms, vaginal sponges, spermicides, birth control pills, and intrauterine devices (IUD).  Sex.  Infection.  Uncontrolled diabetes.  A weakened defense (immune) system.  What increases the risk? This condition is more likely to develop in women who:  Smoke.  Use vaginal douches, scented tampons, or scented sanitary pads.  Wear tight-fitting pants.  Wear thong underwear.  Use oral birth control pills or an IUD.  Have sex without a condom.  Have multiple sex partners.  Have an STD.  Frequently use the spermicide nonoxynol-9.  Eat lots of foods high in sugar.  Have uncontrolled diabetes.  Have low estrogen levels.  Have a weakened immune system from an immune disorder or medical treatment.  Are pregnant or breastfeeding.  What are the signs or symptoms? Symptoms vary depending on the cause of the vaginitis. Common symptoms include:  Abnormal vaginal discharge. ? The discharge is white, gray, or yellow with bacterial vaginosis. ? The discharge is thick, white, and cheesy with a yeast infection. ? The discharge is frothy and yellow or greenish with trichomoniasis.  A bad vaginal smell. The smell is fishy with bacterial vaginosis.  Vaginal itching, pain, or swelling.  Sex that is painful.  Pain or burning when urinating.  Sometimes there are no symptoms. How is this diagnosed? This  condition is diagnosed based on your symptoms and medical history. A physical exam, including a pelvic exam, will also be done. You may also have other tests, including:  Tests to determine the pH level (acidity or alkalinity) of your vagina.  A whiff test, to assess the odor that results when a sample of your vaginal discharge is mixed with a potassium hydroxide solution.  Tests of vaginal fluid. A sample will be examined under a microscope.  How is this treated? Treatment varies depending on the type of vaginitis you have. Your treatment may include:  Antibiotic creams or pills to treat bacterial vaginosis and trichomoniasis.  Antifungal medicines, such as vaginal creams or suppositories, to treat a yeast infection.  Medicine to ease discomfort if you have viral vaginitis. Your sexual partner should also be treated.  Estrogen delivered in a cream, pill, suppository, or vaginal ring to treat atrophic vaginitis. If vaginal dryness occurs, lubricants and moisturizing creams may help. You may need to avoid scented soaps, sprays, or douches.  Stopping use of a product that is causing allergic vaginitis. Then using a vaginal cream to treat the symptoms.  Follow these instructions at home: Lifestyle  Keep your genital area clean and dry. Avoid soap, and only rinse the area with water.  Do not douche or use tampons until your health care provider says it is okay to  do so. Use sanitary pads, if needed.  Do not have sex until your health care provider approves. When you can return to sex, practice safe sex and use condoms.  Wipe from front to back. This avoids the spread of bacteria from the rectum to the vagina. General instructions  Take over-the-counter and prescription medicines only as told by your health care provider.  If you were prescribed an antibiotic medicine, take or use it as told by your health care provider. Do not stop taking or using the antibiotic even if you start to  feel better.  Keep all follow-up visits as told by your health care provider. This is important. How is this prevented?  Use mild, non-scented products. Do not use things that can irritate the vagina, such as fabric softeners. Avoid the following products if they are scented: ? Feminine sprays. ? Detergents. ? Tampons. ? Feminine hygiene products. ? Soaps or bubble baths.  Let air reach your genital area. ? Wear cotton underwear to reduce moisture buildup. ? Avoid wearing underwear while you sleep. ? Avoid wearing tight pants and underwear or nylons without a cotton panel. ? Avoid wearing thong underwear.  Take off any wet clothing, such as bathing suits, as soon as possible.  Practice safe sex and use condoms. Contact a health care provider if:  You have abdominal pain.  You have a fever.  You have symptoms that last for more than 2-3 days. Get help right away if:  You have a fever and your symptoms suddenly get worse. Summary  Vaginitis is a condition in which the vaginal tissue becomes inflamed.This condition is most often caused by a change in the normal balance of bacteria and yeast that live in the vagina.  Treatment varies depending on the type of vaginitis you have.  Do not douche, use tampons , or have sex until your health care provider approves. When you can return to sex, practice safe sex and use condoms. This information is not intended to replace advice given to you by your health care provider. Make sure you discuss any questions you have with your health care provider. Document Released: 08/04/2007 Document Revised: 11/12/2016 Document Reviewed: 11/12/2016 Elsevier Interactive Patient Education  [employer].

## 2017-10-27 NOTE — Progress Notes (Signed)
Patient ID: Debra Williamson, female    DOB: [birth year], 37 y.o.   MRN: [id number]  PCP: Bing NeighborsHarris, Mika Anastasi S, FNP  Chief Complaint  Patient presents with  . Follow-up    1 MONTH    Subjective:  HPI Debra Williamson is a 37 y.o. female with sickle cell anemia, presents for evaluation one month follow-up and medication management. She continues to struggle with chronic, on-going pain, with fluctuating levels of pain. Recently, during [facility] as found to have Anti-C antibody in blood during her last visit 10/23/2016. Aching of joints in upper and lower extremities. Current pain 5/10. Pain increases as the day progresses. She is taking medication almost daily however, if pain is manageable, she doesn't take pain medicine. [facility] where she received monthly apheresis treatments. She denies current chest pain, cough, fever, dysuria, or headache. Continues to complain of shortness of breath although notes improvement of symptoms with use of albuterol inhaler. Last ECHO 05/01/2017 which was unremarkable. She has no other complaints today. Social History   Socioeconomic History  . Marital status: Married    Spouse name: Not on file  . Number of children: 4  . Years of education: Not on file  . Highest education level: Not on file  Social Needs  . Financial resource strain: Not on file  . Food insecurity - worry: Not on file  . Food insecurity - inability: Not on file  . Transportation needs - medical: Not on file  . Transportation needs - non-medical: Not on file  Occupational History  . Occupation: Doesn't work  Tobacco Use  . Smoking status: Never Smoker  . Smokeless tobacco: Never Used  Substance and Sexual Activity  . Alcohol use: No    Alcohol/week: 0.0 oz    Comment: occasionally  . Drug use: No  . Sexual activity: Yes  Other Topics Concern  . Not on file  Social History Narrative  . Not on file    Family History  Problem  Relation Age of Onset  . HIV/AIDS Father    Review of Systems  Constitutional: Negative.   Respiratory: Positive for shortness of breath.   Cardiovascular: Negative.   Genitourinary: Positive for vaginal discharge. Negative for vaginal bleeding and vaginal pain.  Musculoskeletal: Positive for arthralgias.  Skin: Negative.   Neurological: Negative.   Psychiatric/Behavioral: Negative.    Patient Active Problem List   Diagnosis Date Noted  . TIA (transient ischemic attack)   . Vertigo 09/07/2016  . Dyspnea 09/07/2016  . Lightheadedness 09/06/2016  . Healthcare maintenance 07/30/2016  . Dizziness, nonspecific 07/30/2016  . Left foot drop 07/30/2016  . Left-sided weakness 07/30/2016  . 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 History of stroke associated with blood clotting tendency 01/02/2016  . [place] disease (HCC) 10/27/2015  . Hb-SS disease with acute chest syndrome (HCC) 08/14/2015  . Acute chest syndrome (HCC)   . Acute respiratory failure with hypoxemia (HCC)   . Community acquired pneumonia 08/08/2015  . Hemiparesis following cerebrovascular accident (CVA) (HCC) 05/30/2015  . Ankle gives out 05/30/2015  . Seizure disorder (HCC) 05/30/2015  . 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 H/O: stroke 03/12/2015  . Seizures (HCC) 03/12/2015  . Sickle cell disease (HCC) 03/11/2015  . Sickle cell crisis (HCC) 03/11/2015  . Symptomatic anemia 03/11/2015    Allergies  Allergen Reactions  . Ibuprofen  Hives  . Zofran [Ondansetron Hcl] Hives and Itching  . Demerol [Meperidine] Other (See Comments)    Pt has seizures  . Fentanyl And Related Nausea And Vomiting and Other (See Comments)    Very sedated  **Not allergic to the injection** JUST ALLERGIC TO PATCH  . Other Itching    Greek Yogurt  . Codeine Hives and Itching  . Hydrocodone  Hives and Itching    Prior to Admission medications   Medication Sig Start Date End Date Taking? Authorizing Provider  albuterol (PROVENTIL HFA;VENTOLIN HFA) 108 (90 Base) MCG/ACT inhaler Inhale 2 puffs into the lungs every 4 (four) hours as needed for wheezing or shortness of breath (cough, shortness of breath or wheezing.). 02/07/17   Bing Neighbors, FNP  Ascorbic Acid (VITAMIN C) 1000 MG tablet Take 1,000 mg by mouth every evening.    [provider]  aspirin EC 81 MG tablet Take 1 tablet by mouth every evening.  11/03/13   [provider]  cephALEXin (KEFLEX) 500 MG capsule Take 1 capsule (500 mg total) by mouth 2 (two) times daily. 08/01/17   Bing Neighbors, FNP  diclofenac sodium (VOLTAREN) 1 % GEL Apply 2 g topically 4 (four) times daily. 07/30/16   Quentin Angst, MD  folic acid (FOLVITE) 1 MG tablet Take 1 mg by mouth daily.  02/13/15   [provider]  lamoTRIgine (LAMICTAL) 200 MG tablet Take 1 tablet (200 mg total) by mouth 2 (two) times daily. 05/13/17   Bing Neighbors, FNP  levETIRAcetam (KEPPRA) 1000 MG tablet Take 1 tablet (1,000 mg total) by mouth 2 (two) times daily. 05/13/17   Bing Neighbors, FNP  lidocaine-prilocaine (EMLA) cream Apply 1 application as needed topically. 08/25/17   Bing Neighbors, FNP  neomycin-polymyxin-hydrocortisone (CORTISPORIN) OTIC solution Place 3 drops into the left ear 3 (three) times daily. 05/13/17   Bing Neighbors, FNP  omeprazole (PRILOSEC) 40 MG capsule Take 1 capsule (40 mg total) by mouth daily. 05/13/17   Bing Neighbors, FNP  oxyCODONE (OXYCONTIN) 30 MG 12 hr tablet Take 1 tablet (30 mg total) by mouth every 12 (twelve) hours as needed. 10/22/17   Bing Neighbors, FNP  Oxycodone HCl 10 MG TABS Take 1 tablet (10 mg total) by mouth every 6 (six) hours as needed. 10/22/17   Bing Neighbors, FNP  promethazine (PHENERGAN) 25 MG tablet Take 1 tablet (25 mg total) by mouth every 8 (eight) hours as  needed for nausea or vomiting. 1 tablet every 8 hours prn 09/22/17   Bing Neighbors, FNP  zolpidem (AMBIEN) 5 MG tablet Take 1 tablet (5 mg total) by mouth at bedtime as needed. sleep 03/13/15   Altha Harm, MD    Past Medical, Surgical Family and Social History reviewed and updated.    Objective:  There were no vitals filed for this visit.  Wt Readings from Last 3 Encounters:  09/22/17 158 lb (71.7 kg)  08/19/17 162 lb (73.5 kg)  08/01/17 161 lb 6.4 oz (73.2 kg)    Physical Exam Constitutional: She isoriented to person, place, and time. She appearswell-developedand well-nourished.  HENT:  Head:Normocephalicand atraumatic.  Eyes:Pupils are equal, round, and reactive to light.Conjunctivaeand EOMare normal.  Neck:Normal range of motion.Neck supple.  Cardiovascular:Normal rate,regular rhythm,normal heart soundsand intact distal pulses.  Pulmonary/Chest:Effort normaland breath sounds normal.  Lymphadenopathy:  She has no cervical adenopathy.  Neurological: She isalertand oriented to person, place, and time.  Psychiatric: She has anormal mood  and affect. Herbehavior is normal.Judgmentand thought contentnormal.   Assessment & Plan:  1. Hb-SS disease without crisis [facility])  Pulmonary evaluation - Patient denies severe recurrent wheezes, shortness of breath with exercise, or persistent cough. If these symptoms develop, pulmonary function tests with spirometry will be ordered, and if abnormal, plan on referral to Pulmonology for further evaluation.  Eye - High risk of proliferative retinopathy. Annual eye exam with retinal exam recommended to patient, the patient has had eye exam this year.  Immunization status - Yearly influenza vaccination is recommended, as well as being up to date with Meningococcal and Pneumococcal vaccines.   2. Chronic, continuous use of opioids -Current medication regimen with combination of short   Acute and chronic painful  episodes - We agreed on Opiate dose and amount of pills  per month. We discussed that pt is to receive Schedule II prescriptions only from our clinic. Pt is also aware that the prescription history is available to [place] online through the [facility] CSRS. Controlled substance agreement reviewed and signed. We reminded  .Debra Dell that all patients receiving Schedule II narcotics must be seen for follow within one month of prescription being requested. We reviewed the terms of our pain agreement, including the need to keep medicines in a safe locked location away from children or pets, and the need to report excess sedation or constipation, measures to avoid constipation, and policies related to early refills and stolen prescriptions. According to the [place] [facility] program, we have reviewed details related to analgesia, adverse effects and aberrant behaviors.   3. Other form of dyspnea Recent echo negative of any acute findings.  She has some relief with albuterol inhaler.  Recommend continue an albuterol inhaler as directed.  4. Red blood cell antibody positive, Anti-C , antibody noted during recent apheresis.  Counseled patient to [facility] health card indicating positive ant-c antibody as this will need to be noted if she needs an emergent blood transfusion.   5. Vaginal discharge, history of bacterial vaginosis, will obtain a  vaginal specimen.  6. Nausea, chronic, ongoing, occurring intermittently Promethazine (PHENERGAN) 25 MG tablet; Take 1 tablet (25 mg total) by mouth every 8 (eight) hours as needed for nausea or vomiting. 1 tablet every 8 hours prn     Return in about 4 weeks for Sickle Cell Disease/Pain.  The patient was given clear instructions to go to ER or return to medical center if symptoms don't improve, worsen or new problems develop. The patient verbalized understanding. The patient was told to call to get lab results if they haven't heard anything in the next week.       Godfrey Pick. Tiburcio Pea, MSN, FNP-C [facility]  [address] [place], [place] [address] [phone]

## 2017-10-29 ENCOUNTER — Telehealth: Payer: Self-pay | Admitting: Family Medicine

## 2017-10-29 LAB — NUSWAB VAGINITIS PLUS (VG+)
Atopobium vaginae: HIGH Score — AB
BVAB 2: HIGH Score — AB
Candida albicans, NAA: POSITIVE — AB
Candida glabrata, NAA: NEGATIVE
Chlamydia trachomatis, NAA: NEGATIVE
Megasphaera 1: HIGH Score — AB
Neisseria gonorrhoeae, NAA: NEGATIVE
Trich vag by NAA: NEGATIVE

## 2017-10-29 MED ORDER — FLUCONAZOLE 150 MG PO TABS: 150.0000 mg | ORAL_TABLET | Freq: Once | ORAL | 0 refills | Status: AC

## 2017-10-29 MED ORDER — METRONIDAZOLE 500 MG PO TABS: 500.0000 mg | ORAL_TABLET | Freq: Two times a day (BID) | ORAL | 0 refills | Status: DC

## 2017-10-29 NOTE — Telephone Encounter (Signed)
Contact patient to advise vaginal specimen was significant for bacterial vaginosis and yeast. I have e-prescribed  Diflucan and metronidazole to [facility].    Godfrey PickKimberly S. Tiburcio PeaHarris, MSN, FNP-C [facility]  [address] [place], [place] [address] [phone]

## 2017-10-30 MED FILL — FLUCONAZOLE 150 MG TABLET: 150 | 1 days supply | Qty: 1 | Fill #0

## 2017-10-30 MED FILL — metroNIDAZOLE 500 MG TABS: 500 | 7 days supply | Qty: 14 | Fill #0

## 2017-10-30 NOTE — Telephone Encounter (Signed)
Patient notified

## 2017-11-03 MED FILL — lamoTRIgine 200 MG TABS: 200 | 30 days supply | Qty: 60 | Fill #3

## 2017-11-03 MED FILL — levETIRAcetam 1000 MG TABS: 1000 | 30 days supply | Qty: 60 | Fill #3

## 2017-11-21 MED FILL — EXJADE 500 MG TABLET: 500 | 30 days supply | Qty: 90 | Fill #0

## 2017-11-24 ENCOUNTER — Telehealth: Payer: Self-pay

## 2017-11-25 ENCOUNTER — Other Ambulatory Visit: Payer: Self-pay | Admitting: Family Medicine

## 2017-11-25 DIAGNOSIS — R110 Nausea: Secondary | ICD-10-CM

## 2017-11-25 DIAGNOSIS — F1190 Opioid use, unspecified, uncomplicated: Secondary | ICD-10-CM

## 2017-11-25 MED ORDER — OXYCODONE HCL ER 30 MG PO T12A: 30.0000 mg | EXTENDED_RELEASE_TABLET | Freq: Two times a day (BID) | ORAL | 0 refills | Status: DC | PRN

## 2017-11-25 MED ORDER — OXYCODONE HCL 10 MG PO TABS: 10.0000 mg | ORAL_TABLET | Freq: Four times a day (QID) | ORAL | 0 refills | Status: DC | PRN

## 2017-11-25 MED ORDER — PROMETHAZINE HCL 25 MG PO TABS: 25.0000 mg | ORAL_TABLET | Freq: Three times a day (TID) | ORAL | 2 refills | Status: DC | PRN

## 2017-11-25 MED FILL — PROMETHAZINE 25 MG TABLET: 25 | 10 days supply | Qty: 30 | Fill #0

## 2017-11-25 NOTE — Telephone Encounter (Signed)
completed

## 2017-11-25 NOTE — Progress Notes (Signed)
Refilled Phenergan, oxycodone, and OxyContin with fill date of today. Patient will require a urine drug screen prior to picking up medications today.  Godfrey PickKimberly S. Tiburcio PeaHarris, MSN, FNP-C [facility]  [address] [place], [place] [address] [phone]

## 2017-11-28 MED FILL — OxyCONTIN 30 MG T12A: 30 | 30 days supply | Qty: 60 | Fill #0

## 2017-11-28 MED FILL — oxyCODONE HCL 10 MG TABS: 10 | 15 days supply | Qty: 60 | Fill #0

## 2017-12-08 ENCOUNTER — Encounter: Payer: Self-pay | Admitting: Family Medicine

## 2017-12-08 ENCOUNTER — Ambulatory Visit (INDEPENDENT_AMBULATORY_CARE_PROVIDER_SITE_OTHER): Payer: Medicaid Other | Admitting: Family Medicine

## 2017-12-08 VITALS — BP 106/74 | HR 100 | Temp 98.2°F | Resp 16 | Ht 64.0 in | Wt 162.0 lb

## 2017-12-08 DIAGNOSIS — D571 Sickle-cell disease without crisis: Secondary | ICD-10-CM | POA: Diagnosis not present

## 2017-12-08 DIAGNOSIS — N920 Excessive and frequent menstruation with regular cycle: Secondary | ICD-10-CM | POA: Diagnosis not present

## 2017-12-08 DIAGNOSIS — F1190 Opioid use, unspecified, uncomplicated: Secondary | ICD-10-CM | POA: Diagnosis not present

## 2017-12-08 MED ORDER — OXYCODONE HCL 10 MG PO TABS: 10.0000 mg | ORAL_TABLET | Freq: Four times a day (QID) | ORAL | 0 refills | Status: DC | PRN

## 2017-12-08 NOTE — Progress Notes (Signed)
Patient ID: Debra Williamson, female    DOB: 1980/10/27, 37 y.o.   MRN: [id number]  PCP: Bing Neighbors, FNP  Chief Complaint  Patient presents with  . Follow-up    SICKLE CELL    Subjective:  HPI Debra Williamson is a 37 y.o. female with sickle cell anemia, presents for chronic condition and medication management. She wishes to discuss worsening heavy menstrual cycles. She most recently was started on depo-provera injections for management of heavy cycles, however menstrual periods have remained heavy and often precipitated sickle cell painful episodes. She's had a mirena placed in the past, and continued to experience heavy break-through bleeding. She opted to discontinue Depo injections 2-3 months ago and now requests a gynecological referral. Sickle cell pain has remained tolerable with current pain medication regimen. She continues to experience days when pain is manageable without pain medication. Continues periodic episodes of shortness of breath. Dizziness occurs mostly during menstrual cycles-most recent hemoglobin 7.3 which is consistent with her baseline. She denies chest pain and or headache.   Social History   Socioeconomic History  . Marital status: Married    Spouse name: Not on file  . Number of children: 4  . Years of education: Not on file  . Highest education level: Not on file  Social Needs  . Financial resource strain: Not on file  . Food insecurity - worry: Not on file  . Food insecurity - inability: Not on file  . Transportation needs - medical: Not on file  . Transportation needs - non-medical: Not on file  Occupational History  . Occupation: Doesn't work  Tobacco Use  . Smoking status: Never Smoker  . Smokeless tobacco: Never Used  Substance and Sexual Activity  . Alcohol use: No    Alcohol/week: 0.0 oz    Comment: occasionally  . Drug use: No  . Sexual activity: Yes  Other Topics Concern  . Not on file  Social History Narrative  . Not on file     Family History  Problem Relation Age of Onset  . HIV/AIDS Father    Review of Systems   Review of Systems  Constitutional: Negative.   Respiratory: Positive for shortness of breath.   Cardiovascular: Negative.   Musculoskeletal: Positive for arthralgias.  Skin: Negative.   Neurological: Negative.   Psychiatric/Behavioral: Negative.      Patient Active Problem List   Diagnosis Date Noted  . Chronic, continuous use of opioids 10/27/2017  . 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 TIA (transient ischemic attack)   . Vertigo 09/07/2016  . Dyspnea 09/07/2016  . Lightheadedness 09/06/2016  . Healthcare maintenance 07/30/2016  . Dizziness, nonspecific 07/30/2016  . Left foot drop 07/30/2016  . Left-sided weakness 07/30/2016  . 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 History of stroke associated with blood clotting tendency 01/02/2016  . [place] disease (HCC) 10/27/2015  . Hb-SS disease with acute chest syndrome (HCC) 08/14/2015  . Acute chest syndrome (HCC)   . Acute respiratory failure with hypoxemia (HCC)   . Community acquired pneumonia 08/08/2015  . Hemiparesis following cerebrovascular accident (CVA) (HCC) 05/30/2015  . Ankle gives out 05/30/2015  . Seizure disorder (HCC) 05/30/2015  . 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 H/O: stroke 03/12/2015  . Seizures (HCC) 03/12/2015  . Sickle cell disease (HCC) 03/11/2015  . Sickle cell crisis (HCC) 03/11/2015  . Symptomatic anemia 03/11/2015  Allergies  Allergen Reactions  . Ibuprofen Hives  . Zofran [Ondansetron Hcl] Hives and Itching  . Demerol [Meperidine] Other (See Comments)    Pt has seizures  . Fentanyl And Related Nausea And Vomiting and Other (See Comments)    Very sedated  **Not allergic to the injection** JUST ALLERGIC TO PATCH  . Other Itching    Greek  Yogurt  . Codeine Hives and Itching  . Hydrocodone Hives and Itching    Prior to Admission medications   Medication Sig Start Date End Date Taking? Authorizing Provider  albuterol (PROVENTIL HFA;VENTOLIN HFA) 108 (90 Base) MCG/ACT inhaler Inhale 2 puffs into the lungs every 4 (four) hours as needed for wheezing or shortness of breath (cough, shortness of breath or wheezing.). 02/07/17  Yes Bing NeighborsHarris, Lauriana Denes S, FNP  Ascorbic Acid (VITAMIN C) 1000 MG tablet Take 1,000 mg by mouth every evening.   Yes [provider]  aspirin EC 81 MG tablet Take 1 tablet by mouth every evening.  11/03/13  Yes [provider]  diclofenac sodium (VOLTAREN) 1 % GEL Apply 2 g topically 4 (four) times daily. 07/30/16  Yes Jegede, Olugbemiga E, MD  EXJADE 500 MG disintegrating tablet Take 500 mg by mouth 4 (four) times daily. 10/20/17  Yes [provider]  folic acid (FOLVITE) 1 MG tablet Take 1 mg by mouth daily.  02/13/15  Yes [provider]  hydrOXYzine (VISTARIL) 25 MG capsule Take 1 capsule (25 mg total) by mouth every 6 (six) hours as needed. 10/27/17  Yes Bing NeighborsHarris, Audriella Blakeley S, FNP  lamoTRIgine (LAMICTAL) 200 MG tablet Take 1 tablet (200 mg total) by mouth 2 (two) times daily. 05/13/17  Yes Bing NeighborsHarris, Steffanie Mingle S, FNP  levETIRAcetam (KEPPRA) 1000 MG tablet Take 1 tablet (1,000 mg total) by mouth 2 (two) times daily. 05/13/17  Yes Bing NeighborsHarris, Lashe Oliveira S, FNP  lidocaine-prilocaine (EMLA) cream Apply 1 application as needed topically. 08/25/17  Yes Bing NeighborsHarris, Peggy Loge S, FNP  omeprazole (PRILOSEC) 40 MG capsule Take 1 capsule (40 mg total) by mouth daily. 05/13/17  Yes Bing NeighborsHarris, Jhoselin Crume S, FNP  oxyCODONE (OXYCONTIN) 30 MG 12 hr tablet Take 1 tablet (30 mg total) by mouth every 12 (twelve) hours as needed. 11/25/17  Yes Bing NeighborsHarris, Charan Prieto S, FNP  Oxycodone HCl 10 MG TABS Take 1 tablet (10 mg total) by mouth every 6 (six) hours as needed. 11/25/17  Yes Bing NeighborsHarris, Charlii Yost S, FNP  promethazine (PHENERGAN) 25 MG  tablet Take 1 tablet (25 mg total) by mouth every 8 (eight) hours as needed for nausea or vomiting. 1 tablet every 8 hours prn 11/25/17  Yes Bing NeighborsHarris, Mayra Jolliffe S, FNP  zolpidem (AMBIEN) 5 MG tablet Take 1 tablet (5 mg total) by mouth at bedtime as needed. sleep 03/13/15  Yes Altha HarmMatthews, Michelle A, MD  metroNIDAZOLE (FLAGYL) 500 MG tablet Take 1 tablet (500 mg total) by mouth 2 (two) times daily with a meal. DO NOT CONSUME ALCOHOL WHILE TAKING THIS MEDICATION. Patient not taking: Reported on 12/08/2017 10/29/17   Bing NeighborsHarris, Meagan Ancona S, FNP  neomycin-polymyxin-hydrocortisone (CORTISPORIN) OTIC solution Place 3 drops into the left ear 3 (three) times daily. Patient not taking: Reported on 10/27/2017 05/13/17   Bing NeighborsHarris, Rakesha Dalporto S, FNP    Past Medical, Surgical Family and Social History reviewed and updated.    Objective:   Today's Vitals   12/08/17 0817  BP: 106/74  Pulse: 100  Resp: 16  Temp: 98.2 F (36.8 C)  TempSrc: Oral  SpO2: 95%  Weight: 162 lb (73.5 kg)  Height: 5\' 4"  (1.626 m)    Wt Readings from Last 3 Encounters:  12/08/17 162 lb (73.5 kg)  10/27/17 162 lb 6.4 oz (73.7 kg)  09/22/17 158 lb (71.7 kg)    Physical Exam Constitutional: She isoriented to person, place, and time. She appearswell-developedand well-nourished.  HENT:  Head:Normocephalicand atraumatic.  Eyes:Pupils are equal, round, and reactive to light.Conjunctivaeand EOMare normal.  Neck:Normal range of motion.Neck supple.  Cardiovascular:Normal rate,regular rhythm,normal heart soundsand intact distal pulses.  Pulmonary/Chest:Effort normaland breath sounds normal.  Lymphadenopathy:  She has no cervical adenopathy.  Neurological: She isalertand oriented to person, place, and time.  Psychiatric: She has anormal mood and affect. Herbehavior is normal.Judgmentand thought within normal.    Assessment & Plan:  1. Hb-SS disease without crisis [facility])  Pulmonary evaluation - Patient denies severe  recurrent wheezes, shortness of breath with exercise, or persistent cough. If these symptoms develop, pulmonary function tests with spirometry will be ordered, and if abnormal, plan on referral to Pulmonology for further evaluation.  Eye - High risk of proliferative retinopathy. Annual eye exam with retinal exam recommended to patient, the patient has had eye exam this year.  Immunization status - Yearly influenza vaccination is recommended, as well as being up to date with Meningococcal and Pneumococcal vaccines.   Return in about 4 weeks for Sickle Cell Disease/Pain.  The patient was given clear instructions to go to ER or return to medical center if symptoms don't improve, worsen or new problems develop. The patient verbalized understanding. The patient was told to call to get lab results if they haven't heard anything in the next week.      2. Chronic, continuous use of opioids,  - Oxycodone HCl 10 MG TABS; Take 1 tablet (10 mg total) by mouth every 6 (six) hours as needed.  Acute and chronic painful episodes - We agreed on Opiate dose and amount of pills  per month. We discussed that pt is to receive Schedule II prescriptions only from our clinic. Pt is also aware that the prescription history is available to [place] online through the [facility] CSRS. Controlled substance agreement reviewed and signed. We reminded  .[name] that all patients receiving Schedule II narcotics must be seen for follow within one month of prescription being requested. We reviewed the terms of our pain agreement, including the need to keep medicines in a safe locked location away from children or pets, and the need to report excess sedation or constipation, measures to avoid constipation, and policies related to early refills and stolen prescriptions. According to the [place] [facility] program, we have reviewed details related to analgesia, adverse effects and aberrant behaviors.   3. Menorrhagia with  regular cycle, chronic problem, unresolved with contraceptive therapy. Recent PAP -normal. Will refer to Gynecology, [facility] @ [facility]   Meds ordered this encounter  Medications  . Oxycodone HCl 10 MG TABS    Sig: Take 1 tablet (10 mg total) by mouth every 6 (six) hours as needed.    Dispense:  60 tablet    Refill:  0    12/12/2017    Order Specific Question:   Supervising Provider    Answer:   Quentin Angst [[id number]]    Orders Placed This Encounter  Procedures  . Ambulatory referral to Gynecology    RTC: 4 weeks for chronic conditions management     Godfrey Pick. Tiburcio Pea, MSN, FNP-C [facility]  [address] [place] [place],  [place] [address] [phone]

## 2017-12-19 MED FILL — oxyCODONE HCL 10 MG TABS: 10 | 15 days supply | Qty: 60 | Fill #0

## 2017-12-19 MED FILL — PROAIR HFA 90 MCG INHALER: 108 (90 BAS | 30 days supply | Qty: 9 | Fill #0

## 2017-12-23 ENCOUNTER — Encounter: Payer: Self-pay | Admitting: Family Medicine

## 2017-12-23 ENCOUNTER — Ambulatory Visit (INDEPENDENT_AMBULATORY_CARE_PROVIDER_SITE_OTHER): Payer: Medicaid Other | Admitting: Family Medicine

## 2017-12-23 VITALS — BP 116/74 | HR 88 | Temp 98.6°F | Ht 64.0 in | Wt 160.4 lb

## 2017-12-23 DIAGNOSIS — Z309 Encounter for contraceptive management, unspecified: Secondary | ICD-10-CM

## 2017-12-23 DIAGNOSIS — H9201 Otalgia, right ear: Secondary | ICD-10-CM | POA: Diagnosis not present

## 2017-12-23 NOTE — Progress Notes (Signed)
Patient ID: Debra Williamson, female    DOB: [birth year], 37 y.o.   MRN: [id number]  PCP: Bing Neighbors, FNP  Chief Complaint  Patient presents with  . Ear Pain  . Injections    depo     Subjective:  HPI Debra Williamson is a 37 y.o. female with sickle cell anemia, presents for evaluation of ear pain and contraceptive planning.  Patient requested to resume Depo-Provera injections today.  Her last injection to date was September 2018.  She reports heavy menstrual cycles and would like to make another attempt with the depo  injections in hopes of achieving cessation of her menstrual period. Patient's last menstrual period was 11/10/2017.  She also complains of the sensation of something has been crawling in her right ear.  She denies any drainage or actual pain.  The sensation was present a few days ago and is currently not occurring.   Social History   Socioeconomic History  . Marital status: Married    Spouse name: Not on file  . Number of children: 4  . Years of education: Not on file  . Highest education level: Not on file  Social Needs  . Financial resource strain: Not on file  . Food insecurity - worry: Not on file  . Food insecurity - inability: Not on file  . Transportation needs - medical: Not on file  . Transportation needs - non-medical: Not on file  Occupational History  . Occupation: Doesn't work  Tobacco Use  . Smoking status: Never Smoker  . Smokeless tobacco: Never Used  Substance and Sexual Activity  . Alcohol use: No    Alcohol/week: 0.0 oz    Comment: occasionally  . Drug use: No  . Sexual activity: Yes  Other Topics Concern  . Not on file  Social History Narrative  . Not on file    Family History  Problem Relation Age of Onset  . HIV/AIDS Father    Review of Systems  Constitutional: Negative.   HENT:       Ear discomfort  Eyes: Negative.   Genitourinary: Negative.   Neurological: Negative.   Hematological: Negative.    Psychiatric/Behavioral: Negative.     Patient Active Problem List   Diagnosis Date Noted  . Chronic, continuous use of opioids 10/27/2017  . 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 TIA (transient ischemic attack)   . Vertigo 09/07/2016  . Dyspnea 09/07/2016  . Lightheadedness 09/06/2016  . Healthcare maintenance 07/30/2016  . Dizziness, nonspecific 07/30/2016  . Left foot drop 07/30/2016  . Left-sided weakness 07/30/2016  . 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 History of stroke associated with blood clotting tendency 01/02/2016  . [place] disease (HCC) 10/27/2015  . Hb-SS disease with acute chest syndrome (HCC) 08/14/2015  . Acute chest syndrome (HCC)   . Acute respiratory failure with hypoxemia (HCC)   . Community acquired pneumonia 08/08/2015  . Hemiparesis following cerebrovascular accident (CVA) (HCC) 05/30/2015  . Ankle gives out 05/30/2015  . Seizure disorder (HCC) 05/30/2015  . 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 H/O: stroke 03/12/2015  . Seizures (HCC) 03/12/2015  . Sickle cell disease (HCC) 03/11/2015  . Sickle cell crisis (HCC) 03/11/2015  . Symptomatic anemia 03/11/2015    Allergies  Allergen Reactions  . Ibuprofen Hives  . Zofran [Ondansetron Hcl] Hives and Itching  . Demerol [Meperidine] Other (See Comments)    Pt  has seizures  . Fentanyl And Related Nausea And Vomiting and Other (See Comments)    Very sedated  **Not allergic to the injection** JUST ALLERGIC TO PATCH  . Other Itching    Greek Yogurt  . Codeine Hives and Itching  . Hydrocodone Hives and Itching    Prior to Admission medications   Medication Sig Start Date End Date Taking? Authorizing Provider  albuterol (PROVENTIL HFA;VENTOLIN HFA) 108 (90 Base) MCG/ACT inhaler Inhale 2 puffs into the lungs every 4 (four) hours as needed for  wheezing or shortness of breath (cough, shortness of breath or wheezing.). 02/07/17  Yes Bing Neighbors, FNP  Ascorbic Acid (VITAMIN C) 1000 MG tablet Take 1,000 mg by mouth every evening.   Yes [provider]  aspirin EC 81 MG tablet Take 1 tablet by mouth every evening.  11/03/13  Yes [provider]  diclofenac sodium (VOLTAREN) 1 % GEL Apply 2 g topically 4 (four) times daily. 07/30/16  Yes Jegede, Olugbemiga E, MD  EXJADE 500 MG disintegrating tablet Take 500 mg by mouth 4 (four) times daily. 10/20/17  Yes [provider]  folic acid (FOLVITE) 1 MG tablet Take 1 mg by mouth daily.  02/13/15  Yes [provider]  hydrOXYzine (VISTARIL) 25 MG capsule Take 1 capsule (25 mg total) by mouth every 6 (six) hours as needed. 10/27/17  Yes Bing Neighbors, FNP  lamoTRIgine (LAMICTAL) 200 MG tablet Take 1 tablet (200 mg total) by mouth 2 (two) times daily. 05/13/17  Yes Bing Neighbors, FNP  levETIRAcetam (KEPPRA) 1000 MG tablet Take 1 tablet (1,000 mg total) by mouth 2 (two) times daily. 05/13/17  Yes Bing Neighbors, FNP  lidocaine-prilocaine (EMLA) cream Apply 1 application as needed topically. 08/25/17  Yes Bing Neighbors, FNP  metroNIDAZOLE (FLAGYL) 500 MG tablet Take 1 tablet (500 mg total) by mouth 2 (two) times daily with a meal. DO NOT CONSUME ALCOHOL WHILE TAKING THIS MEDICATION. 10/29/17  Yes Bing Neighbors, FNP  neomycin-polymyxin-hydrocortisone (CORTISPORIN) OTIC solution Place 3 drops into the left ear 3 (three) times daily. 05/13/17  Yes Bing Neighbors, FNP  omeprazole (PRILOSEC) 40 MG capsule Take 1 capsule (40 mg total) by mouth daily. 05/13/17  Yes Bing Neighbors, FNP  oxyCODONE (OXYCONTIN) 30 MG 12 hr tablet Take 1 tablet (30 mg total) by mouth every 12 (twelve) hours as needed. 11/25/17  Yes Bing Neighbors, FNP  Oxycodone HCl 10 MG TABS Take 1 tablet (10 mg total) by mouth every 6 (six) hours as needed. 12/08/17  Yes Bing Neighbors, FNP  promethazine (PHENERGAN) 25 MG tablet Take 1 tablet (25 mg total) by mouth every 8 (eight) hours as needed for nausea or vomiting. 1 tablet every 8 hours prn 11/25/17  Yes Bing Neighbors, FNP  zolpidem (AMBIEN) 5 MG tablet Take 1 tablet (5 mg total) by mouth at bedtime as needed. sleep 03/13/15  Yes Altha Harm, MD    Past Medical, Surgical Family and Social History reviewed and updated.    Objective:   Today's Vitals   12/23/17 0823  BP: 116/74  Pulse: 88  Temp: 98.6 F (37 C)  TempSrc: Oral  SpO2: 96%  Weight: 160 lb 6.4 oz (72.8 kg)  Height: 5\' 4"  (1.626 m)    Wt Readings from Last 3 Encounters:  12/23/17 160 lb 6.4 oz (72.8 kg)  12/08/17 162 lb (73.5 kg)  10/27/17 162 lb 6.4 oz (73.7 kg)  Physical Exam  HENT:  Right Ear: Hearing, tympanic membrane, external ear and ear canal normal.  Left Ear: Hearing, tympanic membrane, external ear and ear canal normal.  Cardiovascular: Normal rate, regular rhythm, normal heart sounds and intact distal pulses.  Pulmonary/Chest: Effort normal and breath sounds normal.  Skin: Skin is warm and dry.  Psychiatric: She has a normal mood and affect. Her behavior is normal. Judgment and thought content normal.   Assessment & Plan:  1. Encounter for contraceptive management, unspecified type - POCT urine pregnancy  Patient will return tomorrow to submit urine and obtain Depo injection as she is unable to urinate.  2. Discomfort of right ear, negative for foreign body, irritation, or insect. -Resolved         Godfrey PickKimberly S. Tiburcio PeaHarris, MSN, FNP-C [facility]  [address] [place], [place] [address] [phone]

## 2017-12-24 ENCOUNTER — Ambulatory Visit (INDEPENDENT_AMBULATORY_CARE_PROVIDER_SITE_OTHER): Payer: Medicaid Other

## 2017-12-24 ENCOUNTER — Telehealth: Payer: Self-pay

## 2017-12-24 DIAGNOSIS — F1190 Opioid use, unspecified, uncomplicated: Secondary | ICD-10-CM

## 2017-12-24 DIAGNOSIS — Z3042 Encounter for surveillance of injectable contraceptive: Secondary | ICD-10-CM | POA: Diagnosis not present

## 2017-12-24 MED ORDER — MEDROXYPROGESTERONE ACETATE 150 MG/ML IM SUSP
150.0000 mg | Freq: Once | INTRAMUSCULAR | Status: AC
  Administered 2017-12-24: 150 mg via INTRAMUSCULAR

## 2017-12-24 MED ORDER — OXYCODONE HCL ER 30 MG PO T12A: 30.0000 mg | EXTENDED_RELEASE_TABLET | Freq: Two times a day (BID) | ORAL | 0 refills | Status: DC | PRN

## 2017-12-24 NOTE — Telephone Encounter (Signed)
Refill for OxyContin 30mg . Please advise. Thanks!

## 2017-12-24 NOTE — Telephone Encounter (Signed)
Competed prescription OxyContin 30 mg qty 60.

## 2017-12-25 MED FILL — LIDOCAINE-PRILOCAINE CREAM: 2.5-2.5 | 10 days supply | Qty: 30 | Fill #2

## 2017-12-29 LAB — DRUG SCREEN 764883 11+OXYCO+ALC+CRT-BUND
Amphetamines, Urine: NEGATIVE ng/mL
BENZODIAZ UR QL: NEGATIVE ng/mL
Barbiturate: NEGATIVE ng/mL
Cannabinoid Quant, Ur: NEGATIVE ng/mL
Cocaine (Metabolite): NEGATIVE ng/mL
Creatinine: 73.5 mg/dL (ref 20.0–300.0)
Ethanol: NEGATIVE %
Meperidine: NEGATIVE ng/mL
Methadone Screen, Urine: NEGATIVE ng/mL
Phencyclidine: NEGATIVE ng/mL
Propoxyphene: NEGATIVE ng/mL
Tramadol: NEGATIVE ng/mL
pH, Urine: 5.6 (ref 4.5–8.9)

## 2017-12-29 LAB — OXYCODONE/OXYMORPHONE, CONFIRM
OXYCODONE/OXYMORPH: POSITIVE — AB
OXYCODONE: >3000 ng/mL
OXYCODONE: POSITIVE — AB
OXYMORPHONE (GC/MS): 2757 ng/mL
OXYMORPHONE: POSITIVE — AB

## 2017-12-29 LAB — OPIATES CONFIRMATION, URINE: Opiates: NEGATIVE ng/mL

## 2017-12-29 MED FILL — lamoTRIgine 200 MG TABS: 200 | 30 days supply | Qty: 60 | Fill #4

## 2017-12-29 MED FILL — levETIRAcetam 1000 MG TABS: 1000 | 30 days supply | Qty: 60 | Fill #4

## 2017-12-30 MED FILL — OxyCONTIN 30 MG T12A: 30 | 30 days supply | Qty: 60 | Fill #0

## 2018-01-13 MED FILL — EXJADE 500 MG TABLET: 500 | 30 days supply | Qty: 90 | Fill #1

## 2018-01-19 ENCOUNTER — Ambulatory Visit (INDEPENDENT_AMBULATORY_CARE_PROVIDER_SITE_OTHER): Payer: Medicaid Other | Admitting: Family Medicine

## 2018-01-19 ENCOUNTER — Encounter: Payer: Self-pay | Admitting: Family Medicine

## 2018-01-19 VITALS — BP 112/62 | HR 64 | Temp 98.2°F | Resp 16 | Ht 64.0 in | Wt 159.0 lb

## 2018-01-19 DIAGNOSIS — D571 Sickle-cell disease without crisis: Secondary | ICD-10-CM | POA: Diagnosis not present

## 2018-01-19 DIAGNOSIS — H5789 Other specified disorders of eye and adnexa: Secondary | ICD-10-CM | POA: Diagnosis not present

## 2018-01-19 DIAGNOSIS — M25521 Pain in right elbow: Secondary | ICD-10-CM | POA: Diagnosis not present

## 2018-01-19 LAB — POCT URINALYSIS DIP (DEVICE)
Bilirubin Urine: NEGATIVE
Glucose, UA: NEGATIVE mg/dL
Hgb urine dipstick: NEGATIVE
Ketones, ur: NEGATIVE mg/dL
Leukocytes, UA: NEGATIVE
Nitrite: NEGATIVE
Protein, ur: NEGATIVE mg/dL
Specific Gravity, Urine: 1.01 (ref 1.005–1.030)
Urobilinogen, UA: 1 mg/dL (ref 0.0–1.0)
pH: 5.5 (ref 5.0–8.0)

## 2018-01-19 MED ORDER — OLOPATADINE HCL 0.1 % OP SOLN: 1.0000 [drp] | Freq: Two times a day (BID) | OPHTHALMIC | 12 refills | Status: DC

## 2018-01-19 MED ORDER — OXYCODONE HCL 10 MG PO TABS: 10.0000 mg | ORAL_TABLET | ORAL | 0 refills | Status: DC

## 2018-01-19 MED ORDER — DICLOFENAC SODIUM 1 % TD GEL: 2.0000 g | Freq: Four times a day (QID) | TRANSDERMAL | 1 refills | Status: DC

## 2018-01-19 MED FILL — VOLTAREN 1% GEL: 1 | 12 days supply | Qty: 100 | Fill #0

## 2018-01-19 MED FILL — oxyCODONE HCL 10 MG TABS: 10 | 15 days supply | Qty: 90 | Fill #0

## 2018-01-19 NOTE — Patient Instructions (Addendum)
I am prescribing you diclofenac gel topical for your elbow pain, you may apply 4 times daily as needed for pain You may do warm compresses to your right elbow to help with discomfort  I am prescribing you patanol eye drops for your eyes, place 1 drop in both eyes twice daily for 5 days   Eye Foreign Body A foreign body is an object on or in the eye that should not be there. It could be a speck of dirt or dust, a hair, an eyelash, a splinter, or any other object. It can be on the outside of the eyeball (extraocular) or inside the eyeball. If the object is on the outside of the eyeball, it can usually be washed out or taken out by your doctor. An object inside the eyeball is an emergency, and it must be treated with surgery. Follow these instructions at home:  Take over-the-counter and prescription medicines only as told by your doctor. Use eye drops or ointment as told.  If you were prescribed antibiotic drops or ointment, use it as told by your doctor. Do not stop using it even if you start to feel better.  If you have a bandage on your eye (eye shield): ? Wear it as told. Follow instructions from your doctor about when to take it off. ? Do not drive or use heavy machinery while wearing the bandage.  If you do not have a bandage on your eye: ? Keep your eye closed as much as possible. ? Do not rub your eye. ? Wear dark glasses in bright light. ? Do not wear contact lenses until your eye feels normal, or as told by your doctor. ? If you are doing activities with a high risk of eye injury, such as using high-speed tools, wear protective eye covering.  Keep all follow-up visits as told by your doctor. This is important. Contact a doctor if:  You have more pain in your eye.  You have problems with your eye bandage.  You have abnormal fluid (discharge) coming from your eye. Get help right away if:  Your ability to see (vision) gets worse.  You have more redness and swelling in or  around your eye. Summary  A foreign body is an object on or in the eye that should not be there.  An object on the outside of the eyeball can usually be washed out or taken out by your doctor. An object inside the eyeball is an emergency.  If you have a bandage on your eye (eye shield), do not drive while wearing it.  If you have more pain in your eye, contact your doctor. This information is not intended to replace advice given to you by your health care provider. Make sure you discuss any questions you have with your health care provider. Document Released: 03/27/2010 Document Revised: 10/23/2016 Document Reviewed: 10/23/2016 Elsevier Interactive Patient Education  2017 [employer]

## 2018-01-19 NOTE — Progress Notes (Addendum)
Patient ID: Debra Williamson, female    DOB: 1981/05/03, 37 y.o.   MRN: [id number]  PCP: Bing Neighbors, FNP  Chief Complaint  Patient presents with  . 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   Sickle cell  She continues to struggle with chronic, on-going pain, with fluctuating levels of pain. She has had recent lab work completed at [facility]. Hemoglobin 7.1. She is taking medication almost daily however, if pain is manageable, she doesn't take pain medicine. She receives hematology care at [facility] where she received monthly apheresis treatments.She denies current chest pain, cough, fever, dysuria, or headache. Continues to complain of shortness of breath daily and  notes improvement of symptoms with use of albuterol inhaler. She has not had any recent ED visits or hospitalization within the last 6 months.  Right Elbow Pain This is a new pain separate from sickle cell pain . She denies injury. Reports aching immediately above the right elbow. Mild relief with pain medication. She is unable to tolerate oral NSAIDS and has not attempted relief with any other medications.   Eye irritation  One week ago both eyes were exposed to lotion that patient was rubbing on her face. She did not flush her eyes after exposure and reports puffiness and drainage in eye on and off over the course of 1 week. She reports occasional drainage-uncertain of consistency or color. Denies impairment of vision.   Social History   Socioeconomic History  . Marital status: Married    Spouse name: Not on file  . Number of children: 4  . Years of education: Not on file  . Highest education level: Not on file  Occupational History  . Occupation: Doesn't work  Engineer, production  . Financial resource strain: Not on file  . Food  insecurity:    Worry: Not on file    Inability: Not on file  . Transportation needs:    Medical: Not on file    Non-medical: Not on file  Tobacco Use  . Smoking status: Never Smoker  . Smokeless tobacco: Never Used  Substance and Sexual Activity  . Alcohol use: No    Alcohol/week: 0.0 oz    Comment: occasionally  . Drug use: No  . Sexual activity: Yes  Lifestyle  . Physical activity:    Days per week: Not on file    Minutes per session: Not on file  . Stress: Not on file  Relationships  . Social connections:    Talks on phone: Not on file    Gets together: Not on file    Attends religious service: Not on file    Active member of club or organization: Not on file    Attends meetings of clubs or organizations: Not on file    Relationship status: Not on file  . Intimate partner violence:    Fear of current or ex partner: Not on file    Emotionally abused: Not on file    Physically abused: Not on file    Forced sexual activity: Not on file  Other Topics Concern  . Not on file  Social History Narrative  . Not on file    Family History  Problem Relation Age of Onset  . HIV/AIDS Father    Review of Systems  Pertinent negatives  listed in HPI   Patient Active Problem List   Diagnosis Date Noted  . Chronic, continuous use of opioids 10/27/2017  . 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 TIA (transient ischemic attack)   . Vertigo 09/07/2016  . Dyspnea 09/07/2016  . Lightheadedness 09/06/2016  . Healthcare maintenance 07/30/2016  . Dizziness, nonspecific 07/30/2016  . Left foot drop 07/30/2016  . Left-sided weakness 07/30/2016  . 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 History of stroke associated with blood clotting tendency 01/02/2016  . [place] disease (HCC) 10/27/2015  . Hb-SS disease with acute chest syndrome (HCC) 08/14/2015   . Acute chest syndrome (HCC)   . Acute respiratory failure with hypoxemia (HCC)   . Community acquired pneumonia 08/08/2015  . Hemiparesis following cerebrovascular accident (CVA) (HCC) 05/30/2015  . Ankle gives out 05/30/2015  . Seizure disorder (HCC) 05/30/2015  . 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 H/O: stroke 03/12/2015  . Seizures (HCC) 03/12/2015  . Sickle cell disease (HCC) 03/11/2015  . Sickle cell crisis (HCC) 03/11/2015  . Symptomatic anemia 03/11/2015   Allergies  Allergen Reactions  . Ibuprofen Hives  . Zofran [Ondansetron Hcl] Hives and Itching  . Demerol [Meperidine] Other (See Comments)    Pt has seizures  . Fentanyl And Related Nausea And Vomiting and Other (See Comments)    Very sedated  **Not allergic to the injection** JUST ALLERGIC TO PATCH  . Other Itching    Greek Yogurt  . Codeine Hives and Itching  . Hydrocodone Hives and Itching    Prior to Admission medications   Medication Sig Start Date End Date Taking? Authorizing Provider  albuterol (PROVENTIL HFA;VENTOLIN HFA) 108 (90 Base) MCG/ACT inhaler Inhale 2 puffs into the lungs every 4 (four) hours as needed for wheezing or shortness of breath (cough, shortness of breath or wheezing.). 02/07/17  Yes Bing Neighbors, FNP  Ascorbic Acid (VITAMIN C) 1000 MG tablet Take 1,000 mg by mouth every evening.   Yes [provider]  aspirin EC 81 MG tablet Take 1 tablet by mouth every evening.  11/03/13  Yes [provider]  EXJADE 500 MG disintegrating tablet Take 500 mg by mouth 4 (four) times daily. 10/20/17  Yes [provider]  folic acid (FOLVITE) 1 MG tablet Take 1 mg by mouth daily.  02/13/15  Yes [provider]  hydrOXYzine (VISTARIL) 25 MG capsule Take 1 capsule (25 mg total) by mouth every 6 (six) hours as needed. 10/27/17  Yes Bing Neighbors, FNP  lamoTRIgine (LAMICTAL) 200 MG tablet Take 1 tablet (200 mg total) by mouth 2 (two) times daily. 05/13/17  Yes  Bing Neighbors, FNP  levETIRAcetam (KEPPRA) 1000 MG tablet Take 1 tablet (1,000 mg total) by mouth 2 (two) times daily. 05/13/17  Yes Bing Neighbors, FNP  lidocaine-prilocaine (EMLA) cream Apply 1 application as needed topically. 08/25/17  Yes Bing Neighbors, FNP  neomycin-polymyxin-hydrocortisone (CORTISPORIN) OTIC solution Place 3 drops into the left ear 3 (three) times daily. 05/13/17  Yes Bing Neighbors, FNP  omeprazole (PRILOSEC) 40 MG capsule Take 1 capsule (40 mg total) by mouth daily. 05/13/17  Yes Bing Neighbors, FNP  oxyCODONE (OXYCONTIN) 30 MG 12 hr tablet Take 1 tablet (30 mg total) by mouth every 12 (twelve) hours as needed. 12/24/17  Yes Bing Neighbors, FNP  promethazine (PHENERGAN) 25 MG tablet Take 1 tablet (25 mg total)  by mouth every 8 (eight) hours as needed for nausea or vomiting. 1 tablet every 8 hours prn 11/25/17  Yes Bing NeighborsHarris, Joleen Stuckert S, FNP  diclofenac sodium (VOLTAREN) 1 % GEL Apply 2 g topically 4 (four) times daily. Patient not taking: Reported on 01/19/2018 07/30/16   Quentin AngstJegede, Olugbemiga E, MD  zolpidem (AMBIEN) 5 MG tablet Take 1 tablet (5 mg total) by mouth at bedtime as needed. sleep Patient not taking: Reported on 01/19/2018 03/13/15   Altha HarmMatthews, Michelle A, MD   Past Medical, Surgical Family and Social History reviewed and updated.   Objective:   Today's Vitals   01/19/18 0811  BP: 112/62  Pulse: 64  Resp: 16  Temp: 98.2 F (36.8 C)  TempSrc: Oral  SpO2: 98%  Weight: 159 lb (72.1 kg)  Height: 5\' 4"  (1.626 m)    Wt Readings from Last 3 Encounters:  01/19/18 159 lb (72.1 kg)  12/23/17 160 lb 6.4 oz (72.8 kg)  12/08/17 162 lb (73.5 kg)    Physical Exam  Constitutional: She is oriented to person, place, and time. She appears well-developed and well-nourished.  HENT:  Head: Normocephalic.  Eyes: Scleral icterus is present.  Eyes are negative of drainage or edema today  Neck: Normal range of motion. Neck supple.  Cardiovascular: Normal  rate, regular rhythm, normal heart sounds and intact distal pulses.  Pulmonary/Chest: Effort normal and breath sounds normal.  Abdominal: Soft. Bowel sounds are normal.  Musculoskeletal: Normal range of motion.  Neurological: She is alert and oriented to person, place, and time.  Skin: Skin is warm and dry.  Psychiatric: She has a normal mood and affect. Her behavior is normal. Judgment and thought content normal.   Assessment & Plan:  1. Hb-SS disease without crisis [facility](HCC)   Pulmonary evaluation - Patient denies severe recurrent wheezes, shortness of breath with exercise, or persistent cough. If these symptoms develop, pulmonary function tests with spirometry will be ordered, and if abnormal, plan on referral to Pulmonology for further evaluation.  Eye - High risk of proliferative retinopathy. Annual eye exam with retinal exam recommended to patient, the patient has had eye exam this year.  Immunization status - Yearly influenza vaccination is recommended, as well as being up to date with Meningococcal and Pneumococcal vaccines.   2. Irritation of Eye, eye exam here in office is unremarkable.  Will trial patanol eye drips.   3. Right Elbow Pain, attempt relief with diclofenac topical gel. If no improvement , return to office for further evaluation.   RTC: 6 week for follow-up of chronic sickle cell.  Chronic pain medication filled during today's visit. Patient advise to call chronic 3 days in advance for prescription request.     Godfrey PickKimberly S. Tiburcio PeaHarris, MSN, FNP-C [facility]  [address] [place], [place] [address] [phone]

## 2018-01-21 ENCOUNTER — Telehealth: Payer: Self-pay

## 2018-01-21 DIAGNOSIS — Z90721 Acquired absence of ovaries, unilateral: Secondary | ICD-10-CM | POA: Diagnosis not present

## 2018-01-21 DIAGNOSIS — N920 Excessive and frequent menstruation with regular cycle: Secondary | ICD-10-CM | POA: Diagnosis not present

## 2018-01-21 DIAGNOSIS — D571 Sickle-cell disease without crisis: Secondary | ICD-10-CM | POA: Diagnosis not present

## 2018-01-21 DIAGNOSIS — I69059 Hemiplegia and hemiparesis following nontraumatic subarachnoid hemorrhage affecting unspecified side: Secondary | ICD-10-CM | POA: Diagnosis not present

## 2018-01-21 DIAGNOSIS — Z8679 Personal history of other diseases of the circulatory system: Secondary | ICD-10-CM | POA: Diagnosis not present

## 2018-01-21 MED ORDER — AZELASTINE HCL 0.05 % OP SOLN: 1.0000 [drp] | Freq: Two times a day (BID) | OPHTHALMIC | 12 refills | Status: DC

## 2018-01-21 NOTE — Telephone Encounter (Signed)
Patanol drop is not covered by the insurance and patient needs something else sent in.

## 2018-01-22 ENCOUNTER — Telehealth: Payer: Self-pay

## 2018-01-22 MED ORDER — OLOPATADINE HCL 0.2 % OP SOLN: 1.0000 [drp] | Freq: Two times a day (BID) | OPHTHALMIC | 2 refills | Status: DC | PRN

## 2018-01-22 NOTE — Telephone Encounter (Signed)
Patient notified

## 2018-01-22 NOTE — Telephone Encounter (Signed)
Pataday ophthalmic drops ordered

## 2018-01-22 NOTE — Telephone Encounter (Signed)
Preferred eye drop for medicaid is Cromolyn Sodium and Pataday.

## 2018-01-23 MED FILL — PATADAY 0.2% EYE DROPS: 0.2 | 15 days supply | Qty: 3 | Fill #0

## 2018-01-29 ENCOUNTER — Telehealth: Payer: Self-pay

## 2018-01-30 ENCOUNTER — Other Ambulatory Visit: Payer: Self-pay | Admitting: Internal Medicine

## 2018-01-30 ENCOUNTER — Telehealth: Payer: Self-pay

## 2018-01-30 MED ORDER — OXYCODONE HCL 10 MG PO TABS: 10.0000 mg | ORAL_TABLET | ORAL | 0 refills | Status: DC

## 2018-01-30 MED ORDER — OXYCODONE HCL ER 30 MG PO T12A: 30.0000 mg | EXTENDED_RELEASE_TABLET | Freq: Two times a day (BID) | ORAL | 0 refills | Status: AC

## 2018-01-30 MED ORDER — OXYCODONE HCL ER 30 MG PO T12A: 30.0000 mg | EXTENDED_RELEASE_TABLET | Freq: Two times a day (BID) | ORAL | 0 refills | Status: DC

## 2018-01-30 MED FILL — OxyCONTIN 30 MG T12A: 30 | 30 days supply | Qty: 60 | Fill #0

## 2018-01-30 MED FILL — PROMETHAZINE 25 MG TABLET: 25 | 10 days supply | Qty: 30 | Fill #1

## 2018-01-30 MED FILL — AZELASTINE HCL 0.05% DROPS: 0.05 | 30 days supply | Qty: 6 | Fill #0

## 2018-01-30 NOTE — Telephone Encounter (Signed)
Eprescribed.

## 2018-02-04 DIAGNOSIS — D571 Sickle-cell disease without crisis: Secondary | ICD-10-CM | POA: Diagnosis not present

## 2018-02-04 DIAGNOSIS — H1013 Acute atopic conjunctivitis, bilateral: Secondary | ICD-10-CM | POA: Diagnosis not present

## 2018-02-13 MED FILL — PROGESTERONE 100 MG CAPSULE: 100 | 30 days supply | Qty: 30 | Fill #0

## 2018-02-13 NOTE — Progress Notes (Deleted)
[facility] - Clinic Note  02/16/2018     CHIEF COMPLAINT Patient presents for No chief complaint on file.   HISTORY OF PRESENT ILLNESS: Debra Williamson is a 37 y.o. female who presents to the clinic today for:     Referring physician: Mateo Flow, MD [address] [place], [place] [address]  HISTORICAL INFORMATION:   Selected notes from the MEDICAL RECORD NUMBER Referred by Dr. 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 Hallahan) [[facility]: OD: 20/25 OS: 20/30-] Ocular Hx- sickle cell disease PMH- sickle cell disease, stroke    CURRENT MEDICATIONS: Current Outpatient Medications (Ophthalmic Drugs)  Medication Sig  . Olopatadine HCl (PATADAY) 0.2 % SOLN Apply 1 drop to eye 2 (two) times daily as needed.   No current facility-administered medications for this visit.  (Ophthalmic Drugs)   Current Outpatient Medications (Other)  Medication Sig  . albuterol (PROVENTIL HFA;VENTOLIN HFA) 108 (90 Base) MCG/ACT inhaler Inhale 2 puffs into the lungs every 4 (four) hours as needed for wheezing or shortness of breath (cough, shortness of breath or wheezing.).  Marland Kitchen Ascorbic Acid (VITAMIN C) 1000 MG tablet Take 1,000 mg by mouth every evening.  Marland Kitchen aspirin EC 81 MG tablet Take 1 tablet by mouth every evening.   . diclofenac sodium (VOLTAREN) 1 % GEL Apply 2 g topically 4 (four) times daily.  Marland Kitchen EXJADE 500 MG disintegrating tablet Take 500 mg by mouth 4 (four) times daily.  . folic acid (FOLVITE) 1 MG tablet Take 1 mg by mouth daily.   . hydrOXYzine (VISTARIL) 25 MG capsule Take 1 capsule (25 mg total) by mouth every 6 (six) hours as needed.  . lamoTRIgine (LAMICTAL) 200 MG tablet Take 1 tablet (200 mg total) by mouth 2 (two) times daily.  Marland Kitchen levETIRAcetam (KEPPRA) 1000 MG tablet Take 1 tablet (1,000 mg total) by mouth 2 (two) times daily.  Marland Kitchen lidocaine-prilocaine (EMLA) cream Apply 1 application as needed topically.  Marland Kitchen  neomycin-polymyxin-hydrocortisone (CORTISPORIN) OTIC solution Place 3 drops into the left ear 3 (three) times daily.  Marland Kitchen omeprazole (PRILOSEC) 40 MG capsule Take 1 capsule (40 mg total) by mouth daily.  Marland Kitchen oxyCODONE (OXYCONTIN) 30 MG 12 hr tablet Take 1 tablet (30 mg total) by mouth every 12 (twelve) hours.  . Oxycodone HCl 10 MG TABS Take 1 tablet (10 mg total) by mouth every 4 (four) hours.  . promethazine (PHENERGAN) 25 MG tablet Take 1 tablet (25 mg total) by mouth every 8 (eight) hours as needed for nausea or vomiting. 1 tablet every 8 hours prn  . zolpidem (AMBIEN) 5 MG tablet Take 1 tablet (5 mg total) by mouth at bedtime as needed. sleep (Patient not taking: Reported on 01/19/2018)   No current facility-administered medications for this visit.  (Other)      REVIEW OF SYSTEMS:    ALLERGIES Allergies  Allergen Reactions  . Ibuprofen Hives  . Zofran [Ondansetron Hcl] Hives and Itching  . Demerol [Meperidine] Other (See Comments)    Pt has seizures  . Fentanyl And Related Nausea And Vomiting and Other (See Comments)    Very sedated  **Not allergic to the injection** JUST ALLERGIC TO PATCH  . Other Itching    Greek Yogurt  . Codeine Hives and Itching  . Hydrocodone Hives and Itching    PAST MEDICAL HISTORY Past Medical History:  Diagnosis Date  . Anemia   . Blood transfusion without reported diagnosis   . Seizures (HCC)  one time incident, may have been r/t to a pain medication she was prescribed  . Sickle cell disease (HCC)   . Stroke [facility](HCC)    Past Surgical History:  Procedure Laterality Date  . CESAREAN SECTION     x4  . CHOLECYSTECTOMY    . PORTACATH PLACEMENT Right w-3  . reverse tubal ligation    . TEE WITHOUT CARDIOVERSION N/A 09/10/2016   Procedure: TRANSESOPHAGEAL ECHOCARDIOGRAM (TEE);  Surgeon: Thurmon FairMihai Croitoru, MD;  [facility];  Service: Cardiovascular;  Laterality: N/A;    FAMILY HISTORY Family History  Problem Relation Age of Onset  .  HIV/AIDS Father     SOCIAL HISTORY Social History   Tobacco Use  . Smoking status: Never Smoker  . Smokeless tobacco: Never Used  Substance Use Topics  . Alcohol use: No    Alcohol/week: 0.0 oz    Comment: occasionally  . Drug use: No         OPHTHALMIC EXAM:   Not recorded      IMAGING AND PROCEDURES  Imaging and Procedures for 02/13/18           ASSESSMENT/PLAN:    ICD-10-CM   1. Retinal edema H35.81 OCT, Retina - OU - Both Eyes    1.  2.  3.  Ophthalmic Meds Ordered this visit:  No orders of the defined types were placed in this encounter.      No follow-ups on file.  There are no Patient Instructions on file for this visit.   Explained the diagnoses, plan, and follow up with the patient and they expressed understanding.  Patient expressed understanding of the importance of proper follow up care.   This document serves as a record of services personally performed by Karie ChimeraBrian G. Zamora, MD, PhD. It was created on their behalf by Laurian BrimAmanda Kelii Chittum, OA, an ophthalmic assistant. The creation of this record is the provider's dictation and/or activities during the visit.    Electronically signed by: Laurian BrimAmanda Sharnetta Gielow, OA  02/13/18 9:10 AM    Karie ChimeraBrian G. Zamora, M.D., Ph.D. Diseases & Surgery of the Retina and Vitreous [facility] 02/13/18     Abbreviations: M myopia (nearsighted); A astigmatism; H hyperopia (farsighted); P presbyopia; Mrx spectacle prescription;  CTL contact lenses; OD right eye; OS left eye; OU both eyes  XT exotropia; ET esotropia; PEK punctate epithelial keratitis; PEE punctate epithelial erosions; DES dry eye syndrome; MGD meibomian gland dysfunction; ATs artificial tears; PFAT's preservative free artificial tears; NSC nuclear sclerotic cataract; PSC posterior subcapsular cataract; ERM epi-retinal membrane; PVD posterior vitreous detachment; RD retinal detachment; DM diabetes mellitus; DR diabetic retinopathy; NPDR  non-proliferative diabetic retinopathy; PDR proliferative diabetic retinopathy; CSME clinically significant macular edema; DME diabetic macular edema; dbh dot blot hemorrhages; CWS cotton wool spot; POAG primary open angle glaucoma; C/D cup-to-disc ratio; HVF humphrey visual field; GVF goldmann visual field; OCT optical coherence tomography; IOP intraocular pressure; BRVO Branch retinal vein occlusion; CRVO central retinal vein occlusion; CRAO central retinal artery occlusion; BRAO branch retinal artery occlusion; RT retinal tear; SB scleral buckle; PPV pars plana vitrectomy; VH Vitreous hemorrhage; PRP panretinal laser photocoagulation; IVK intravitreal kenalog; VMT vitreomacular traction; MH Macular hole;  NVD neovascularization of the disc; NVE neovascularization elsewhere; AREDS age related eye disease study; ARMD age related macular degeneration; POAG primary open angle glaucoma; EBMD epithelial/anterior basement membrane dystrophy; ACIOL anterior chamber intraocular lens; IOL intraocular lens; PCIOL posterior chamber intraocular lens; Phaco/IOL phacoemulsification with intraocular lens placement; PRK photorefractive keratectomy; LASIK  laser assisted in situ keratomileusis; HTN hypertension; DM diabetes mellitus; COPD chronic obstructive pulmonary disease

## 2018-02-16 ENCOUNTER — Encounter (INDEPENDENT_AMBULATORY_CARE_PROVIDER_SITE_OTHER): Payer: Self-pay | Admitting: Ophthalmology

## 2018-02-16 MED FILL — miSOPROStol 200 MCG TABS: 200 | 1 days supply | Qty: 1 | Fill #0

## 2018-02-27 MED FILL — LIDOCAINE-PRILOCAINE CREAM: 2.5-2.5 | 10 days supply | Qty: 30 | Fill #3

## 2018-02-27 MED FILL — EXJADE 500 MG TABLET: 500 | 30 days supply | Qty: 90 | Fill #2

## 2018-03-03 MED FILL — levETIRAcetam 1000 MG TABS: 1000 | 30 days supply | Qty: 60 | Fill #5

## 2018-03-03 MED FILL — lamoTRIgine 200 MG TABS: 200 | 30 days supply | Qty: 60 | Fill #5

## 2018-03-04 ENCOUNTER — Telehealth: Payer: Self-pay

## 2018-03-04 ENCOUNTER — Other Ambulatory Visit: Payer: Self-pay | Admitting: Internal Medicine

## 2018-03-04 DIAGNOSIS — D571 Sickle-cell disease without crisis: Secondary | ICD-10-CM

## 2018-03-04 DIAGNOSIS — R110 Nausea: Secondary | ICD-10-CM

## 2018-03-04 MED ORDER — OXYCONTIN 30 MG PO T12A: 1.0000 | EXTENDED_RELEASE_TABLET | Freq: Two times a day (BID) | ORAL | 0 refills | Status: DC

## 2018-03-04 MED ORDER — PROMETHAZINE HCL 25 MG PO TABS: 25.0000 mg | ORAL_TABLET | Freq: Three times a day (TID) | ORAL | 2 refills | Status: DC | PRN

## 2018-03-04 MED ORDER — OXYCODONE HCL 10 MG PO TABS: 10.0000 mg | ORAL_TABLET | ORAL | 0 refills | Status: DC

## 2018-03-04 NOTE — Telephone Encounter (Signed)
Refilled

## 2018-03-06 ENCOUNTER — Ambulatory Visit: Payer: Medicaid Other | Admitting: Family Medicine

## 2018-03-09 ENCOUNTER — Encounter: Payer: Self-pay | Admitting: Family Medicine

## 2018-03-09 ENCOUNTER — Telehealth: Payer: Self-pay

## 2018-03-09 ENCOUNTER — Ambulatory Visit (INDEPENDENT_AMBULATORY_CARE_PROVIDER_SITE_OTHER): Payer: Medicaid Other | Admitting: Family Medicine

## 2018-03-09 VITALS — BP 110/62 | HR 92 | Temp 98.3°F | Resp 14 | Ht 64.0 in | Wt 157.0 lb

## 2018-03-09 DIAGNOSIS — R110 Nausea: Secondary | ICD-10-CM | POA: Diagnosis not present

## 2018-03-09 DIAGNOSIS — Z09 Encounter for follow-up examination after completed treatment for conditions other than malignant neoplasm: Secondary | ICD-10-CM | POA: Diagnosis not present

## 2018-03-09 DIAGNOSIS — R52 Pain, unspecified: Secondary | ICD-10-CM | POA: Diagnosis not present

## 2018-03-09 DIAGNOSIS — D571 Sickle-cell disease without crisis: Secondary | ICD-10-CM | POA: Diagnosis not present

## 2018-03-09 LAB — POCT URINALYSIS DIP (MANUAL ENTRY)
Bilirubin, UA: NEGATIVE
Blood, UA: NEGATIVE
Glucose, UA: NEGATIVE mg/dL
Ketones, POC UA: NEGATIVE mg/dL
Leukocytes, UA: NEGATIVE
Nitrite, UA: NEGATIVE
Protein Ur, POC: NEGATIVE mg/dL
Spec Grav, UA: 1.01 (ref 1.010–1.025)
Urobilinogen, UA: 1 E.U./dL
pH, UA: 5.5 (ref 5.0–8.0)

## 2018-03-09 MED ORDER — OXYCONTIN 30 MG PO T12A: 1.0000 | EXTENDED_RELEASE_TABLET | Freq: Two times a day (BID) | ORAL | 0 refills | Status: DC

## 2018-03-09 MED ORDER — OXYCODONE HCL 10 MG PO TABS: 10.0000 mg | ORAL_TABLET | ORAL | 0 refills | Status: DC

## 2018-03-09 MED ORDER — PROMETHAZINE HCL 25 MG PO TABS: 25.0000 mg | ORAL_TABLET | Freq: Three times a day (TID) | ORAL | 2 refills | Status: DC | PRN

## 2018-03-09 MED FILL — OxyCONTIN 30 MG T12A: 30 | 30 days supply | Qty: 60 | Fill #0

## 2018-03-09 NOTE — Telephone Encounter (Signed)
PA has been submitted for Oxycodone  and waiting approval from Medicaid.

## 2018-03-09 NOTE — Progress Notes (Signed)
Subjective:     Patient ID: Debra Williamson, female   DOB: 09-Apr-1981, 37 y.o.   MRN: [id number]  PCP: Raliegh Ip, NP  Chief Complaint  Patient presents with  . Follow-up    6 weeks on sickle cell    HPI  Patient has history of HB-SS Sickle Cell Anemia, Seizures, and Anemia. She is here today for her 6 weeks follow up.    Current Status: She is here today for follow up assessment and refills on medications. She states that she is doing well. She denies fevers, chills, recent infections, fatigue, weight loss, and night sweats. She denies visual changes, dizziness, headaches, and falls. She does have occasional chest pain. Denies cough, and shortness of breath.   She denies any recent bleeding episodes. She does have mild nausea occasionally. She has a good appetite. bowel movements are normal. She denies abdominal pain, vomiting, diarrhea, constipation, and blood in stools.   She that she is taking pain medications as directed. She continue to take Phenergan concurrently with her pain medications. States that her pain is usually located in her joints, back, arms, and legs. She continues to take Folic Acid daily is directed. Pain is stable today.   Past Medical History:  Diagnosis Date  . Anemia   . Blood transfusion without reported diagnosis   . Seizures (HCC)    one time incident, may have been r/t to a pain medication she was prescribed  . Sickle cell disease (HCC)   . Stroke [facility])    Family History  Problem Relation Age of Onset  . HIV/AIDS Father    Social History   Socioeconomic History  . Marital status: Married    Spouse name: Not on file  . Number of children: 4  . Years of education: Not on file  . Highest education level: Not on file  Occupational History  . Occupation: Doesn't work  Engineer, production  . Financial resource strain: Not on file  . Food insecurity:    Worry: Not on file    Inability: Not on file  . Transportation needs:    Medical: Not on  file    Non-medical: Not on file  Tobacco Use  . Smoking status: Never Smoker  . Smokeless tobacco: Never Used  Substance and Sexual Activity  . Alcohol use: No    Alcohol/week: 0.0 oz    Comment: occasionally  . Drug use: No  . Sexual activity: Yes  Lifestyle  . Physical activity:    Days per week: Not on file    Minutes per session: Not on file  . Stress: Not on file  Relationships  . Social connections:    Talks on phone: Not on file    Gets together: Not on file    Attends religious service: Not on file    Active member of club or organization: Not on file    Attends meetings of clubs or organizations: Not on file    Relationship status: Not on file  . Intimate partner violence:    Fear of current or ex partner: Not on file    Emotionally abused: Not on file    Physically abused: Not on file    Forced sexual activity: Not on file  Other Topics Concern  . Not on file  Social History Narrative  . Not on file   Immunization History  Administered Date(s) Administered  . Influenza,inj,Quad PF,6+ Mos 08/12/2015, 07/30/2016, 06/16/2017  . Pneumococcal Polysaccharide-23 03/13/2015  . Tdap 02/07/2017  Current Meds  Medication Sig  . albuterol (PROVENTIL HFA;VENTOLIN HFA) 108 (90 Base) MCG/ACT inhaler Inhale 2 puffs into the lungs every 4 (four) hours as needed for wheezing or shortness of breath (cough, shortness of breath or wheezing.).  Marland Kitchen Ascorbic Acid (VITAMIN C) 1000 MG tablet Take 1,000 mg by mouth every evening.  Marland Kitchen aspirin EC 81 MG tablet Take 1 tablet by mouth every evening.   . diclofenac sodium (VOLTAREN) 1 % GEL Apply 2 g topically 4 (four) times daily.  Marland Kitchen EXJADE 500 MG disintegrating tablet Take 500 mg by mouth 4 (four) times daily.  . folic acid (FOLVITE) 1 MG tablet Take 1 mg by mouth daily.   . hydrOXYzine (VISTARIL) 25 MG capsule Take 1 capsule (25 mg total) by mouth every 6 (six) hours as needed.  . lamoTRIgine (LAMICTAL) 200 MG tablet Take 1 tablet (200  mg total) by mouth 2 (two) times daily.  Marland Kitchen levETIRAcetam (KEPPRA) 1000 MG tablet Take 1 tablet (1,000 mg total) by mouth 2 (two) times daily.  Marland Kitchen lidocaine-prilocaine (EMLA) cream Apply 1 application as needed topically.  Marland Kitchen neomycin-polymyxin-hydrocortisone (CORTISPORIN) OTIC solution Place 3 drops into the left ear 3 (three) times daily.  . Olopatadine HCl (PATADAY) 0.2 % SOLN Apply 1 drop to eye 2 (two) times daily as needed.  Marland Kitchen omeprazole (PRILOSEC) 40 MG capsule Take 1 capsule (40 mg total) by mouth daily.  . Oxycodone HCl 10 MG TABS Take 1 tablet (10 mg total) by mouth every 4 (four) hours for 15 days.  . OXYCONTIN 30 MG 12 hr tablet Take 1 tablet (30 mg total) by mouth 2 (two) times daily.  . promethazine (PHENERGAN) 25 MG tablet Take 1 tablet (25 mg total) by mouth every 8 (eight) hours as needed for nausea or vomiting. 1 tablet every 8 hours prn  . [DISCONTINUED] Oxycodone HCl 10 MG TABS Take 1 tablet (10 mg total) by mouth every 4 (four) hours for 15 days.  . [DISCONTINUED] OXYCONTIN 30 MG 12 hr tablet Take 1 tablet (30 mg total) by mouth 2 (two) times daily.  . [DISCONTINUED] OXYCONTIN 30 MG 12 hr tablet Take 1 tablet (30 mg total) by mouth 2 (two) times daily.  . [DISCONTINUED] promethazine (PHENERGAN) 25 MG tablet Take 1 tablet (25 mg total) by mouth every 8 (eight) hours as needed for nausea or vomiting. 1 tablet every 8 hours prn   Allergies  Allergen Reactions  . Ibuprofen Hives  . Zofran [Ondansetron Hcl] Hives and Itching  . Demerol [Meperidine] Other (See Comments)    Pt has seizures  . Fentanyl And Related Nausea And Vomiting and Other (See Comments)    Very sedated  **Not allergic to the injection** JUST ALLERGIC TO PATCH  . Other Itching    Greek Yogurt  . Codeine Hives and Itching  . Hydrocodone Hives and Itching    BP 110/62 (BP Location: Left Arm, Patient Position: Sitting, Cuff Size: Small)   Pulse 92   Temp 98.3 F (36.8 C) (Oral)   Resp 14   Ht   (1.626 m)   Wt 157 lb (71.2 kg)   LMP 02/11/2018   SpO2 96%   BMI 26.95 kg/m   Review of Systems  Constitutional: Negative.   HENT: Negative.   Eyes: Negative.   Respiratory: Negative.   Cardiovascular: Negative.   Gastrointestinal: Negative.   Endocrine: Negative.   Genitourinary: Negative.   Musculoskeletal: Negative.   Skin: Negative.   Hematological: Negative.   Psychiatric/Behavioral:  Negative.     Objective:  Physical Exam  Constitutional: She is oriented to person, place, and time. She appears well-developed and well-nourished.  HENT:  Head: Normocephalic and atraumatic.  Eyes: Pupils are equal, round, and reactive to light. EOM are normal.  Neck: Normal range of motion.  Cardiovascular: Normal rate, regular rhythm, normal heart sounds and intact distal pulses.  Abdominal: Soft. Bowel sounds are normal.  Musculoskeletal: Normal range of motion.  Neurological: She is alert and oriented to person, place, and time.  Skin: Skin is warm and dry. Capillary refill takes less than 2 seconds.  Psychiatric: She has a normal mood and affect. Her behavior is normal. Judgment and thought content normal.   Assessment:   1. Hb-SS disease without crisis (HCC) 2. Nausea 3. Pain 4. Follow up   Plan:  1. Hb-SS disease without crisis [facility]) Patient is doing well. Pain is stable today. Urinalysis is negative today.  She will continue taking pain medications, stay well hydrated, avoid extreme cold, heat, stress, and infection, as it may trigger crisis.   Rx for Phenergan, Oxycodone, and Oxycontin were called in to [facility], patient wants to pick up here a [employer]. Paper prescriptions given to patient to take to pharmacy today. Copies made of Oxycodone and Oxycontin Rxs.  - POCT urinalysis dipstick - OXYCONTIN 30 MG 12 hr tablet; Take 1 tablet (30 mg total) by mouth 2 (two) times daily.  Dispense: 60 tablet; Refill: 0  2. Nausea Stable today. She experiences nausea when  taking pain medications.  - promethazine (PHENERGAN) 25 MG tablet; Take 1 tablet (25 mg total) by mouth every 8 (eight) hours as needed for nausea or vomiting. 1 tablet every 8 hours prn  Dispense: 30 tablet; Refill: 2  3. Pain Stable.  4. Follow up We will follow up with her in 6 weeks for Sickle Cell Disease Management.   Meds ordered this encounter  Medications  . promethazine (PHENERGAN) 25 MG tablet    Sig: Take 1 tablet (25 mg total) by mouth every 8 (eight) hours as needed for nausea or vomiting. 1 tablet every 8 hours prn    Dispense:  30 tablet    Refill:  2  . DISCONTD: OXYCONTIN 30 MG 12 hr tablet    Sig: Take 1 tablet (30 mg total) by mouth 2 (two) times daily.    Dispense:  60 tablet    Refill:  0  . Oxycodone HCl 10 MG TABS    Sig: Take 1 tablet (10 mg total) by mouth every 4 (four) hours for 15 days.    Dispense:  90 tablet    Refill:  0  . OXYCONTIN 30 MG 12 hr tablet    Sig: Take 1 tablet (30 mg total) by mouth 2 (two) times daily.    Dispense:  60 tablet    Refill:  0    Raliegh Ip,  MSN, FNP-BC Patient [facility] [address] [place], [place] [address] [phone]

## 2018-03-10 ENCOUNTER — Encounter: Payer: Self-pay | Admitting: Family Medicine

## 2018-03-11 MED FILL — oxyCODONE HCL 10 MG TABS: 10 | 15 days supply | Qty: 90 | Fill #0

## 2018-03-11 MED FILL — PROMETHAZINE 25 MG TABLET: 25 | 10 days supply | Qty: 30 | Fill #0

## 2018-03-20 ENCOUNTER — Encounter (HOSPITAL_COMMUNITY): Payer: Self-pay

## 2018-03-20 ENCOUNTER — Emergency Department (HOSPITAL_COMMUNITY)
Admission: EM | Admit: 2018-03-20 | Discharge: 2018-03-20 | Disposition: A | Discharge status: Home / Self Care | Payer: Medicaid Other | Attending: Emergency Medicine | Admitting: Emergency Medicine

## 2018-03-20 ENCOUNTER — Emergency Department (HOSPITAL_COMMUNITY): Payer: Medicaid Other

## 2018-03-20 DIAGNOSIS — S92415A Nondisplaced fracture of proximal phalanx of left great toe, initial encounter for closed fracture: Secondary | ICD-10-CM | POA: Insufficient documentation

## 2018-03-20 DIAGNOSIS — W500XXA Accidental hit or strike by another person, initial encounter: Secondary | ICD-10-CM | POA: Insufficient documentation

## 2018-03-20 DIAGNOSIS — Y929 Unspecified place or not applicable: Secondary | ICD-10-CM | POA: Insufficient documentation

## 2018-03-20 DIAGNOSIS — Y998 Other external cause status: Secondary | ICD-10-CM | POA: Diagnosis not present

## 2018-03-20 DIAGNOSIS — Y9389 Activity, other specified: Secondary | ICD-10-CM | POA: Insufficient documentation

## 2018-03-20 DIAGNOSIS — Z79899 Other long term (current) drug therapy: Secondary | ICD-10-CM | POA: Insufficient documentation

## 2018-03-20 DIAGNOSIS — S99922A Unspecified injury of left foot, initial encounter: Secondary | ICD-10-CM | POA: Diagnosis present

## 2018-03-20 DIAGNOSIS — Z8673 Personal history of transient ischemic attack (TIA), and cerebral infarction without residual deficits: Secondary | ICD-10-CM | POA: Diagnosis not present

## 2018-03-20 DIAGNOSIS — Z7982 Long term (current) use of aspirin: Secondary | ICD-10-CM | POA: Insufficient documentation

## 2018-03-20 MED ORDER — PROMETHAZINE HCL 25 MG PO TABS
25.0000 mg | ORAL_TABLET | Freq: Four times a day (QID) | ORAL | Status: DC | PRN
  Administered 2018-03-20: 25 mg via ORAL
  Filled 2018-03-20: qty 1

## 2018-03-20 MED ORDER — OXYCODONE-ACETAMINOPHEN 5-325 MG PO TABS
1.0000 | ORAL_TABLET | Freq: Once | ORAL | Status: AC
  Administered 2018-03-20: 1 via ORAL
  Filled 2018-03-20: qty 1

## 2018-03-20 NOTE — Progress Notes (Signed)
Orthopedic Tech Progress Note Patient Details:  Debra Williamson 15-Jan-1981 [id number] Pt left and crutches was giving to herGloriajean Williamson husband. Patient ID: Debra DellShanequa Williamson, female   DOB: 15-Jan-1981, [age] y.o.   MRN: [id number]   Debra CarnesHarrison, [name] 03/20/2018, 7:50 PM

## 2018-03-20 NOTE — ED Triage Notes (Signed)
Pt presents with c/o left knee pain. Pt reports she fell onto her left knee. EMS initially treated with ice but pt called back and said she couldn't bend it or apply weight.

## 2018-03-20 NOTE — Discharge Instructions (Signed)
You have a small stable chip fracture at the base of your big toe. This is usually treated with splinting and hard soled shoe. Your splint is adequate.  Take home medications for pain. Ice. Rest. Elevate.   Follow up with your orthopedist in 1 week if pain is persistent and/or your gait has been disturbed due to pain.

## 2018-03-20 NOTE — ED Provider Notes (Signed)
[facility]-EMERGENCY DEPT Provider Note   CSN: [id number] Arrival date & time: 03/20/18  1725     History   Chief Complaint Chief Complaint  Patient presents with  . Knee Pain    HPI Debra Williamson is a 37 y.o. female with history of sickle cell disease, stroke with left-sided weakness, chronic pain syndrome, is here for evaluation of sudden onset left lower extremity pain that began today after mechanical fall.  Patient states that her son was running in externally posterior to the side causing her to fall onto her left leg, she thinks that her leg bend inward and internal rotation.  Her pain is described as sharp, constant but intermittently worse with weightbearing, palpation and movement.  Her pain is moderate.  Her pain is localized to the posterior left knee and spreads down to the calf, ankle and toes.  Aggravating factors include palpation, movement, weightbearing.  Has tried ice without relief.  States typically she has chronic inversion of her left ankle due to stroke, she wears an ankle brace to keep her ankle straight.  She denies any new numbness or paresthesias distally.  HPI  Past Medical History:  Diagnosis Date  . Anemia   . Blood transfusion without reported diagnosis   . Seizures (HCC)    one time incident, may have been r/t to a pain medication she was prescribed  . Sickle cell disease (HCC)   . Stroke [facility])     Patient Active Problem List   Diagnosis Date Noted  . Chronic, continuous use of opioids 10/27/2017  . 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 TIA (transient ischemic attack)   . Vertigo 09/07/2016  . Dyspnea 09/07/2016  . Lightheadedness 09/06/2016  . Healthcare maintenance 07/30/2016  . Dizziness, nonspecific 07/30/2016  . Left foot drop 07/30/2016  . Left-sided weakness 07/30/2016  . 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 History of stroke associated with blood clotting tendency 01/02/2016  . [place] disease (HCC) 10/27/2015  . Hb-SS disease with acute chest syndrome (HCC) 08/14/2015  . Acute chest syndrome (HCC)   . Acute respiratory failure with hypoxemia (HCC)   . Community acquired pneumonia 08/08/2015  . Hemiparesis following cerebrovascular accident (CVA) (HCC) 05/30/2015  . Ankle gives out 05/30/2015  . Seizure disorder (HCC) 05/30/2015  . 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 H/O: stroke 03/12/2015  . Seizures (HCC) 03/12/2015  . Sickle cell disease (HCC) 03/11/2015  . Sickle cell crisis (HCC) 03/11/2015  . Symptomatic anemia 03/11/2015    Past Surgical History:  Procedure Laterality Date  . CESAREAN SECTION     x4  . CHOLECYSTECTOMY    . PORTACATH PLACEMENT Right w-3  . reverse tubal ligation    . TEE WITHOUT CARDIOVERSION N/A 09/10/2016   Procedure: TRANSESOPHAGEAL ECHOCARDIOGRAM (TEE);  Surgeon: Thurmon Fair, MD;  Location: [facility] ENDOSCOPY;  Service: Cardiovascular;  Laterality: N/A;     OB History   None      Home Medications    Prior to Admission medications   Medication Sig Start Date End Date Taking? Authorizing Provider  albuterol (PROVENTIL HFA;VENTOLIN HFA) 108 (90 Base) MCG/ACT inhaler Inhale 2 puffs into the lungs every 4 (four) hours as needed for wheezing or shortness of breath (cough, shortness of breath or wheezing.). 02/07/17   Bing Neighbors, FNP  Ascorbic Acid (VITAMIN C) 1000 MG tablet Take 1,000 mg by mouth  every evening.    [provider]  aspirin EC 81 MG tablet Take 1 tablet by mouth every evening.  11/03/13   [provider]  diclofenac sodium (VOLTAREN) 1 % GEL Apply 2 g topically 4 (four) times daily. 01/19/18   Bing Neighbors, FNP  EXJADE 500 MG disintegrating tablet Take 500 mg by mouth 4 (four) times daily. 10/20/17   [provider]  folic acid (FOLVITE) 1 MG tablet  Take 1 mg by mouth daily.  02/13/15   [provider]  hydrOXYzine (VISTARIL) 25 MG capsule Take 1 capsule (25 mg total) by mouth every 6 (six) hours as needed. 10/27/17   Bing Neighbors, FNP  lamoTRIgine (LAMICTAL) 200 MG tablet Take 1 tablet (200 mg total) by mouth 2 (two) times daily. 05/13/17   Bing Neighbors, FNP  levETIRAcetam (KEPPRA) 1000 MG tablet Take 1 tablet (1,000 mg total) by mouth 2 (two) times daily. 05/13/17   Bing Neighbors, FNP  lidocaine-prilocaine (EMLA) cream Apply 1 application as needed topically. 08/25/17   Bing Neighbors, FNP  neomycin-polymyxin-hydrocortisone (CORTISPORIN) OTIC solution Place 3 drops into the left ear 3 (three) times daily. 05/13/17   Bing Neighbors, FNP  Olopatadine HCl (PATADAY) 0.2 % SOLN Apply 1 drop to eye 2 (two) times daily as needed. 01/22/18   Bing Neighbors, FNP  omeprazole (PRILOSEC) 40 MG capsule Take 1 capsule (40 mg total) by mouth daily. 05/13/17   Bing Neighbors, FNP  Oxycodone HCl 10 MG TABS Take 1 tablet (10 mg total) by mouth every 4 (four) hours for 15 days. 03/09/18 03/24/18  Kallie Locks, FNP  OXYCONTIN 30 MG 12 hr tablet Take 1 tablet (30 mg total) by mouth 2 (two) times daily. 03/09/18 04/08/18  Kallie Locks, FNP  promethazine (PHENERGAN) 25 MG tablet Take 1 tablet (25 mg total) by mouth every 8 (eight) hours as needed for nausea or vomiting. 1 tablet every 8 hours prn 03/09/18   Kallie Locks, FNP  zolpidem (AMBIEN) 5 MG tablet Take 1 tablet (5 mg total) by mouth at bedtime as needed. sleep Patient not taking: Reported on 03/09/2018 03/13/15   Altha Harm, MD    Family History Family History  Problem Relation Age of Onset  . HIV/AIDS Father     Social History Social History   Tobacco Use  . Smoking status: Never Smoker  . Smokeless tobacco: Never Used  Substance Use Topics  . Alcohol use: No    Alcohol/week: 0.0 oz    Comment: occasionally  . Drug use: No     Allergies     Ibuprofen; Zofran [ondansetron hcl]; Demerol [meperidine]; Fentanyl and related; Other; Codeine; and Hydrocodone   Review of Systems Review of Systems  Musculoskeletal: Positive for arthralgias, gait problem and myalgias.  All other systems reviewed and are negative.    Physical Exam Updated Vital Signs BP 114/69 (BP Location: Right Arm)   Pulse 87   Temp 98.3 F (36.8 C) (Oral)   Resp 18   Ht 5\' 4"  (1.626 m)   Wt 71.2 kg (157 lb)   SpO2 95%   BMI 26.95 kg/m   Physical Exam  Constitutional: She is oriented to person, place, and time. She appears well-developed and well-nourished. No distress.  NAD.  HENT:  Head: Normocephalic and atraumatic.  Right Ear: External ear normal.  Left Ear: External ear normal.  Nose: Nose normal.  Eyes: Conjunctivae and EOM are normal. No scleral  icterus.  Neck: Normal range of motion. Neck supple.  Cardiovascular: Normal rate, regular rhythm and normal heart sounds.  No murmur heard. 2+ DP and PT pulses bilaterally. Calves supple with asymmetry or tenderness.   Pulmonary/Chest: Effort normal and breath sounds normal. She has no wheezes.  Musculoskeletal: Normal range of motion. She exhibits tenderness and deformity (left ankle in inversion, chronic per patient).  LEFT KNEE: diffuse tenderness to popliteal space otherwise no anterior bony tenderness. Normal J tracking of patella with PROM. No obvious deformity. No overlaying erythema, edema, warmth, ecchymosis. No laxity.    Left ANKLE: diffuse tenderness to calcaneus.  She has minimal AROM of ankle, chronic due to stroke however full PROM of ankle without pain. No laxity. Chronic inversion of ankle. No overlaying erythema, edema, warmth, ecchymosis. No laxity.   Left foot: tenderness to base of the great toe, fourth and fifth toes.  Reproducible pain with PROM of 4th and 5th toes.  Patient unable to wiggle toes which is chronic. F  Neurological: She is alert and oriented to person, place, and  time.  Sensation to light touch intact in left foot. Decreased LEFT ankle F/E, chronic from stroke. Unable to flex or extend big LEFT toe, chronic. Normal strength in RIGHT LE.  Skin: Skin is warm and dry. Capillary refill takes less than 2 seconds.  Psychiatric: She has a normal mood and affect. Her behavior is normal. Judgment and thought content normal.  Nursing note and vitals reviewed.    ED Treatments / Results  Labs (all labs ordered are listed, but only abnormal results are displayed) Labs Reviewed - No data to display  EKG None  Radiology Dg Ankle Complete Left  Result Date: 03/20/2018 CLINICAL DATA:  Left ankle pain since the patient suffered a fall trying to break up a dog fight today. Initial encounter. EXAM: LEFT ANKLE COMPLETE - 3+ VIEW COMPARISON:  None. FINDINGS: There is no evidence of fracture, dislocation, or joint effusion. Osteoarthritis about the first tarsometatarsal joint is noted. Soft tissues are unremarkable. IMPRESSION: No acute abnormality. Electronically Signed   By: Drusilla Kanner M.D.   On: 03/20/2018 18:37   Dg Knee Complete 4 Views Left  Result Date: 03/20/2018 CLINICAL DATA:  Left knee pain since the patient suffered a fall trying to break up a dog fight today. Initial encounter. EXAM: LEFT KNEE - COMPLETE 4+ VIEW COMPARISON:  None. FINDINGS: No evidence of fracture, dislocation, or joint effusion. No evidence of arthropathy or other focal bone abnormality. Soft tissues are unremarkable. IMPRESSION: Negative exam. Electronically Signed   By: Drusilla Kanner M.D.   On: 03/20/2018 18:37   Dg Foot Complete Left  Result Date: 03/20/2018 CLINICAL DATA:  Left foot pain since the patient tried to stop a dog fight today. Initial encounter. EXAM: LEFT FOOT - COMPLETE 3+ VIEW COMPARISON:  None. FINDINGS: There is a nondisplaced fracture of the medial corner of the base of the proximal phalanx of the great toe. No other fracture is identified. Mild degenerative  disease about the first tarsometatarsal joint is noted. IMPRESSION: Nondisplaced chip fracture off the medial corner of the proximal phalanx of the great toe. 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 Electronically Signed   By: Drusilla Kanner M.D.   On: 03/20/2018 18:36    Procedures Procedures (including critical care time)  Medications Ordered in ED Medications  promethazine (PHENERGAN) tablet 25 mg (25 mg Oral Given 03/20/18 1903)  oxyCODONE-acetaminophen (PERCOCET/ROXICET) 5-325 MG per tablet 1 tablet (1 tablet  Oral Given 03/20/18 1808)     Initial Impression / Assessment and Plan / ED Course  I have reviewed the triage vital signs and the nursing notes.  Pertinent labs & imaging results that were available during my care of the patient were reviewed by me and considered in my medical decision making (see chart for details).     37 yo with h/o SCD and stroke here for traumatic L knee, ankle and foot pain. X-ray shows nondisplaced chip fx of base of great toe. She has point tenderness here. Will discharge with crutches. She wears a hard sole splint on left foot/ankle that extend underneath toes keeping toes in neutral position.  I do not think different intervention is needed. She has medications for chronic sickle cell pain at home. Encouraged RICE protocol. Given chronic deformity of ankle/LLE, I encouraged her to f/u with orthopedist in 1 week if pain is persistent and/or if her gait is changed from pain.  She verbalized understanding and is in agreement  Final Clinical Impressions(s) / ED Diagnoses   Final diagnoses:  Closed nondisplaced fracture of proximal phalanx of left great toe, initial encounter    ED Discharge Orders    None       Jerrell MylarGibbons, Hopie Pellegrin J, PA-C 03/20/18 1942    Lorre NickAllen, Anthony, MD 03/21/18 1642

## 2018-03-25 ENCOUNTER — Encounter: Payer: Self-pay | Admitting: Family Medicine

## 2018-03-25 ENCOUNTER — Ambulatory Visit (INDEPENDENT_AMBULATORY_CARE_PROVIDER_SITE_OTHER): Payer: Medicaid Other | Admitting: Family Medicine

## 2018-03-25 VITALS — BP 107/57 | Ht 64.0 in | Wt 157.0 lb

## 2018-03-25 DIAGNOSIS — M25562 Pain in left knee: Secondary | ICD-10-CM

## 2018-03-25 DIAGNOSIS — S92415A Nondisplaced fracture of proximal phalanx of left great toe, initial encounter for closed fracture: Secondary | ICD-10-CM | POA: Diagnosis not present

## 2018-03-25 MED ORDER — MELOXICAM 15 MG PO TABS: 15.0000 mg | ORAL_TABLET | Freq: Every day | ORAL | 0 refills | Status: DC

## 2018-03-25 MED FILL — MELOXICAM 15 MG TABLET: 15 | 30 days supply | Qty: 30 | Fill #0
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
## 2018-03-26 MED FILL — HYDROXYZINE PAM 25 MG CAP: 25 | 15 days supply | Qty: 60 | Fill #1

## 2018-04-01 ENCOUNTER — Encounter

## 2018-04-01 ENCOUNTER — Ambulatory Visit: Payer: Medicaid Other | Admitting: Family Medicine

## 2018-04-01 MED FILL — metroNIDAZOLE 500 MG TABS: 500 | 7 days supply | Qty: 14 | Fill #0

## 2018-04-06 ENCOUNTER — Telehealth: Payer: Self-pay

## 2018-04-08 IMAGING — DX DG CHEST 2V
2 series · 2 of 2 positions shown · non-contrast
Comparison: September 06, 2016

CLINICAL DATA: Shortness of breath.

EXAM:
CHEST  2 VIEW

[chest pa]
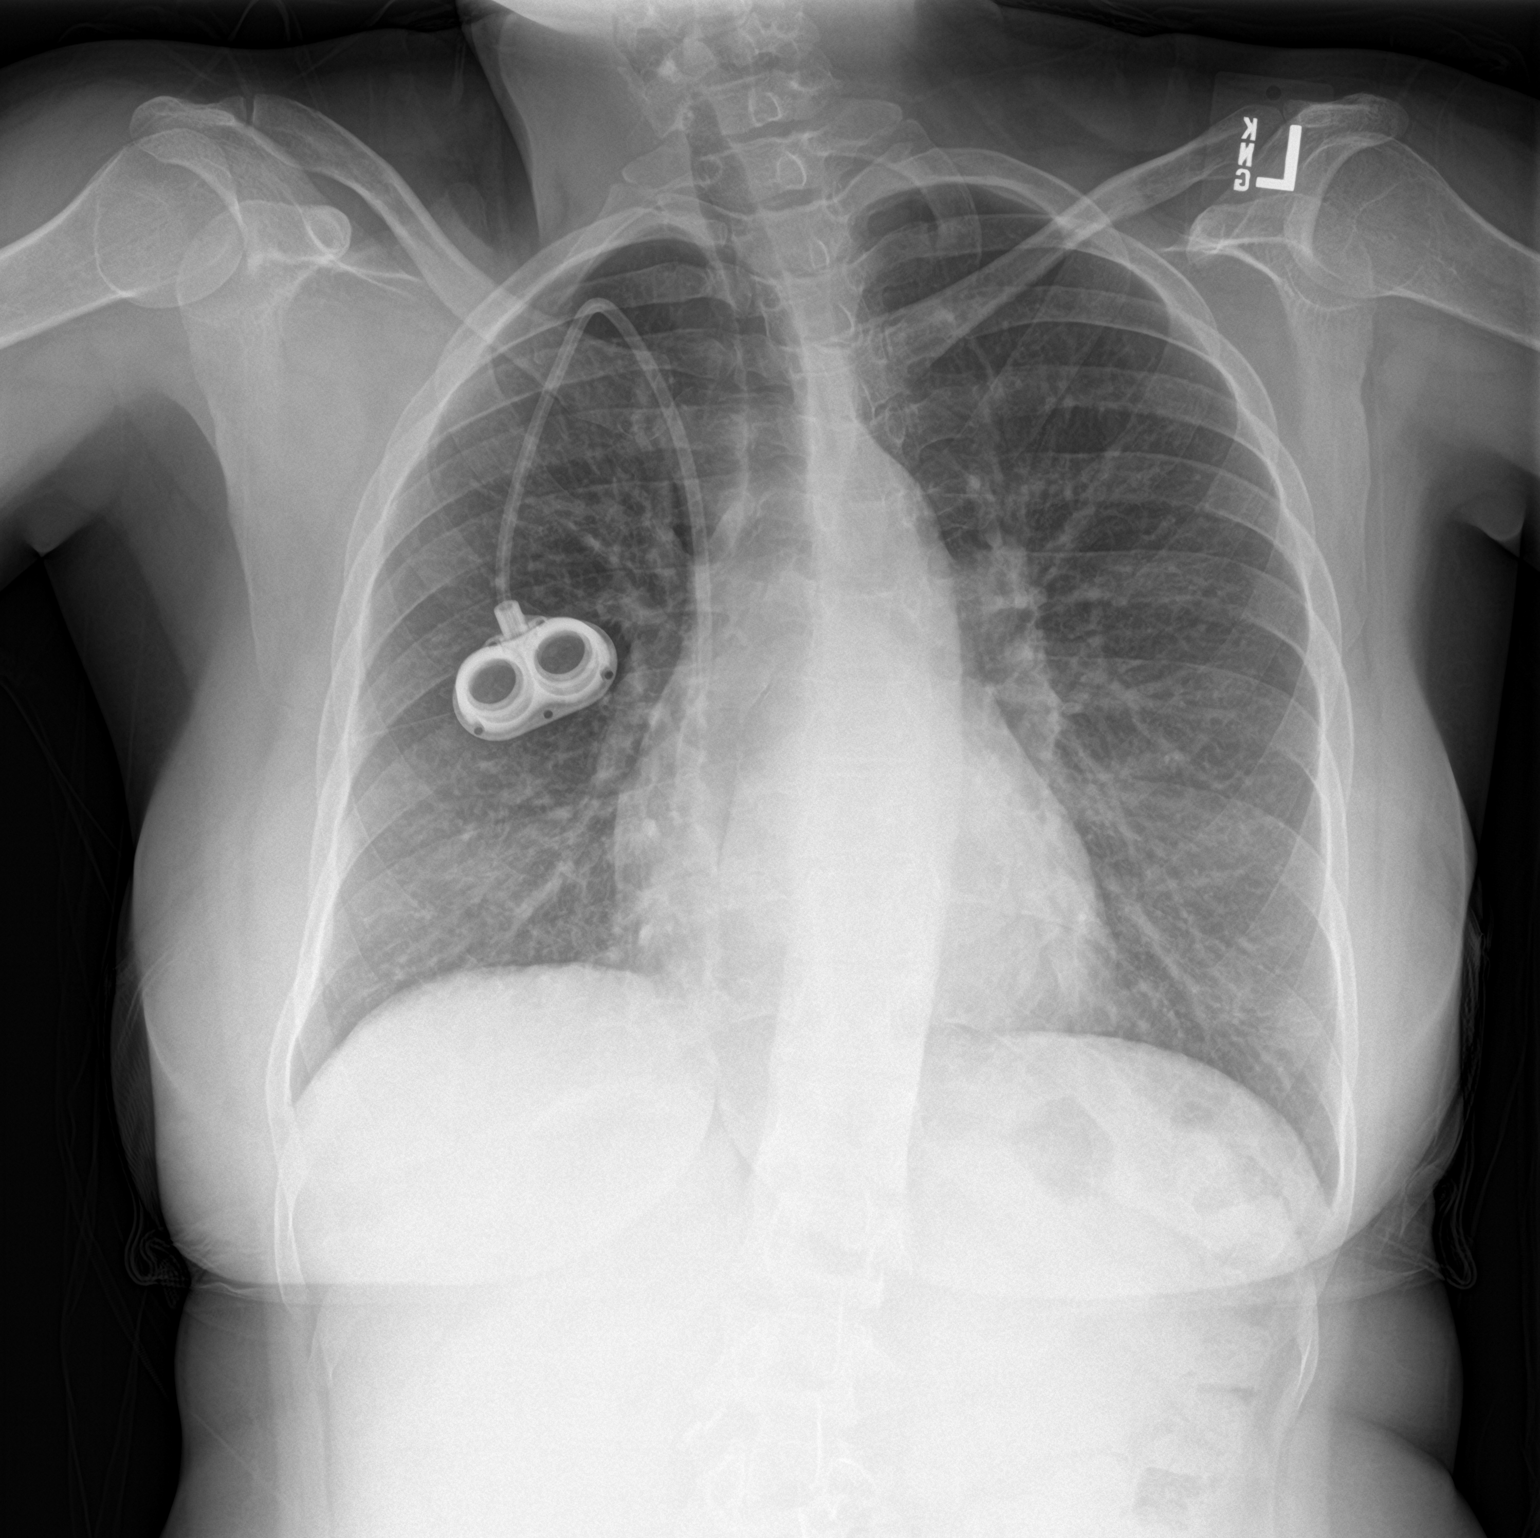

[chest lat]
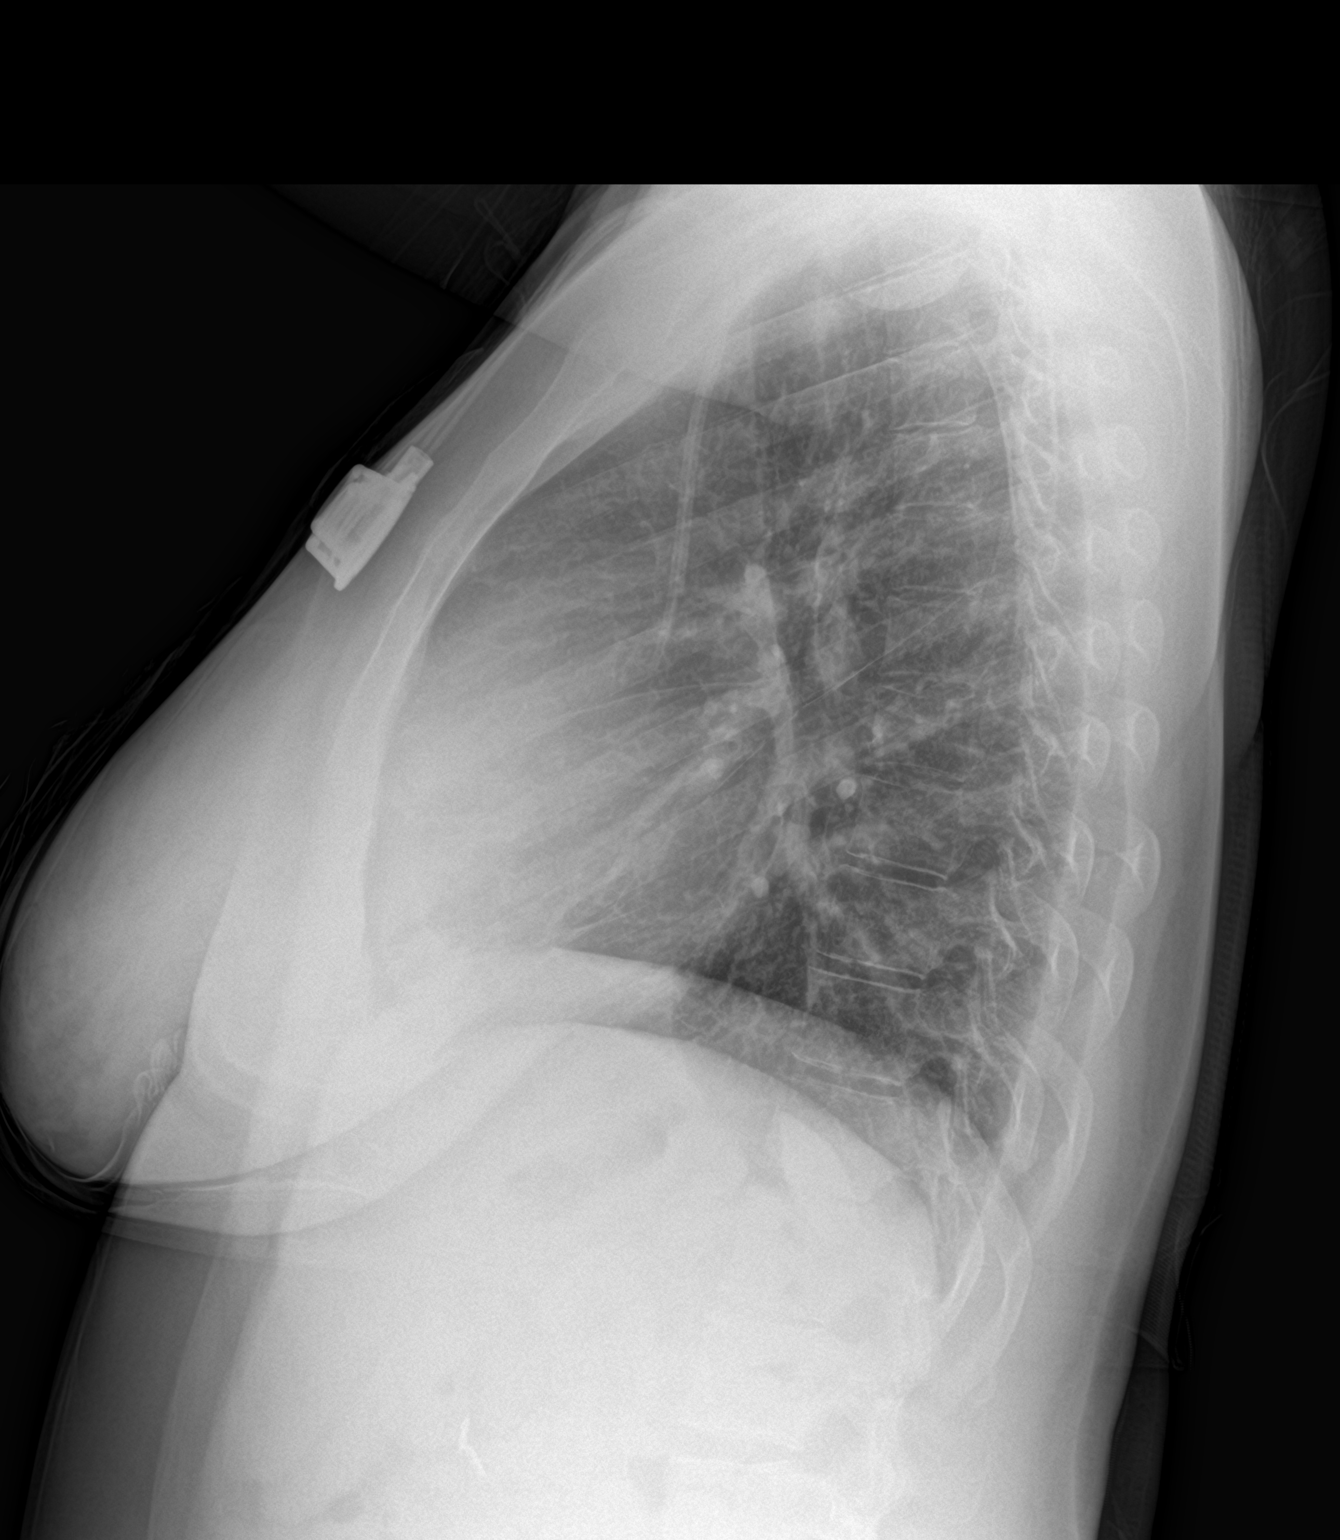

[2 of 2 positions shown; findings below may reference images not displayed]

FINDINGS: A double lumen right porta catheter is stable terminating in the
SVC. No pneumothorax. The heart, hila, and mediastinum are normal.
No pulmonary nodules, masses, or focal infiltrate.
IMPRESSION: No active cardiopulmonary disease.

## 2018-04-09 ENCOUNTER — Other Ambulatory Visit: Payer: Self-pay | Admitting: Family Medicine

## 2018-04-09 DIAGNOSIS — D571 Sickle-cell disease without crisis: Secondary | ICD-10-CM

## 2018-04-09 MED ORDER — OXYCODONE HCL 10 MG PO TABS: 10.0000 mg | ORAL_TABLET | ORAL | 0 refills | Status: DC

## 2018-04-09 MED ORDER — OXYCONTIN 30 MG PO T12A: 1.0000 | EXTENDED_RELEASE_TABLET | Freq: Two times a day (BID) | ORAL | 0 refills | Status: DC

## 2018-04-09 MED FILL — OxyCONTIN 30 MG T12A: 30 | 30 days supply | Qty: 60 | Fill #0

## 2018-04-09 MED FILL — oxyCODONE HCL 10 MG TABS: 10 | 15 days supply | Qty: 90 | Fill #0

## 2018-04-10 MED FILL — PROMETHAZINE 25 MG TABLET: 25 | 10 days supply | Qty: 30 | Fill #1

## 2018-04-10 NOTE — Telephone Encounter (Signed)
-----   Message from Kallie LocksNatalie M Stroud, FNP sent at 04/09/2018  2:11 PM EDT ----- Regarding: "Refills" Debra Williamson,  Inform patient that Rx for pain meds are called in to [facility]. Did not fill Phenergan because we last filled on 03/09/2018 for #30 tabs and it looks like she has 2 additional refills.   Please and thank you!

## 2018-04-10 NOTE — Telephone Encounter (Signed)
Patient notified

## 2018-04-24 DIAGNOSIS — Z7689 Persons encountering health services in other specified circumstances: Secondary | ICD-10-CM | POA: Diagnosis not present

## 2018-04-24 DIAGNOSIS — D571 Sickle-cell disease without crisis: Secondary | ICD-10-CM | POA: Diagnosis not present

## 2018-04-27 ENCOUNTER — Ambulatory Visit (INDEPENDENT_AMBULATORY_CARE_PROVIDER_SITE_OTHER): Payer: Medicaid Other | Admitting: Family Medicine

## 2018-04-27 ENCOUNTER — Other Ambulatory Visit: Payer: Self-pay | Admitting: Family Medicine

## 2018-04-27 ENCOUNTER — Encounter: Payer: Self-pay | Admitting: Family Medicine

## 2018-04-27 VITALS — BP 114/68 | HR 100 | Temp 98.0°F | Ht 64.0 in | Wt 163.0 lb

## 2018-04-27 DIAGNOSIS — R8290 Unspecified abnormal findings in urine: Secondary | ICD-10-CM | POA: Diagnosis not present

## 2018-04-27 DIAGNOSIS — F1190 Opioid use, unspecified, uncomplicated: Secondary | ICD-10-CM | POA: Diagnosis not present

## 2018-04-27 DIAGNOSIS — R110 Nausea: Secondary | ICD-10-CM | POA: Diagnosis not present

## 2018-04-27 DIAGNOSIS — D571 Sickle-cell disease without crisis: Secondary | ICD-10-CM

## 2018-04-27 DIAGNOSIS — Z09 Encounter for follow-up examination after completed treatment for conditions other than malignant neoplasm: Secondary | ICD-10-CM

## 2018-04-27 DIAGNOSIS — N390 Urinary tract infection, site not specified: Secondary | ICD-10-CM

## 2018-04-27 DIAGNOSIS — Z95828 Presence of other vascular implants and grafts: Secondary | ICD-10-CM

## 2018-04-27 LAB — POCT URINALYSIS DIP (MANUAL ENTRY)
Bilirubin, UA: NEGATIVE
Blood, UA: NEGATIVE
Glucose, UA: NEGATIVE mg/dL
Ketones, POC UA: NEGATIVE mg/dL
Nitrite, UA: NEGATIVE
Protein Ur, POC: NEGATIVE mg/dL
Spec Grav, UA: 1.015 (ref 1.010–1.025)
Urobilinogen, UA: 2 E.U./dL — AB
pH, UA: 6 (ref 5.0–8.0)

## 2018-04-27 MED ORDER — SULFAMETHOXAZOLE-TRIMETHOPRIM 800-160 MG PO TABS: 1.0000 | ORAL_TABLET | Freq: Two times a day (BID) | ORAL | 0 refills | Status: DC

## 2018-04-27 MED ORDER — LIDOCAINE-PRILOCAINE 2.5-2.5 % EX CREA: 1.0000 "application " | TOPICAL_CREAM | CUTANEOUS | 3 refills | Status: DC | PRN

## 2018-04-27 MED FILL — SULFAMETHOXAZOLE-TMP DS TAB: 800-160 | 7 days supply | Qty: 14 | Fill #0

## 2018-04-27 MED FILL — LIDOCAINE-PRILOCAINE CREAM: 2.5-2.5 | 30 days supply | Qty: 30 | Fill #0

## 2018-04-27 NOTE — Progress Notes (Addendum)
Subjective:    Patient ID: Debra Williamson, female    DOB: 07/02/1981, 37 y.o.   MRN: [id number]   PCP: Debra IpNatalie Rachell Druckenmiller, NP  Chief Complaint  Patient presents with  . Follow-up    6 weeks on sickle cell   HPI  [name] has a past medical history of Sickle Anemia, Seizures, and Anemia.   Current Status: Since her last office visit, she is doing well with no complaints.   She denies fevers, chills,  recent infections, weight loss, and night sweats. She does report mild fatigue.  Reports occasional headaches and mild dizziness at times. She has not had any visual changes, and falls.   No chest pain,cough and shortness of breath reported. Occasional heart palpitations. She states that she uses Albuterol Inhaler once weekly.   No reports of GI problems such as nausea, vomiting, diarrhea, and constipation. She has no reports of blood in stools, dysuria and hematuria.   No depression or anxiety.   She reports mild joint pain today.   Past Medical History:  Diagnosis Date  . Anemia   . Blood transfusion without reported diagnosis   . Seizures (HCC)    one time incident, may have been r/t to a pain medication she was prescribed  . Sickle cell disease (HCC)   . Stroke [facility](HCC)     Family History  Problem Relation Age of Onset  . HIV/AIDS Father     Social History   Socioeconomic History  . Marital status: Married    Spouse name: Not on file  . Number of children: 4  . Years of education: Not on file  . Highest education level: Not on file  Occupational History  . Occupation: Doesn't work  Engineer, productionocial Needs  . Financial resource strain: Not on file  . Food insecurity:    Worry: Not on file    Inability: Not on file  . Transportation needs:    Medical: Not on file    Non-medical: Not on file  Tobacco Use  . Smoking status: Never Smoker  . Smokeless tobacco: Never Used  Substance and Sexual Activity  . Alcohol use: No    Alcohol/week: 0.0 oz    Comment:  occasionally  . Drug use: No  . Sexual activity: Yes  Lifestyle  . Physical activity:    Days per week: Not on file    Minutes per session: Not on file  . Stress: Not on file  Relationships  . Social connections:    Talks on phone: Not on file    Gets together: Not on file    Attends religious service: Not on file    Active member of club or organization: Not on file    Attends meetings of clubs or organizations: Not on file    Relationship status: Not on file  . Intimate partner violence:    Fear of current or ex partner: Not on file    Emotionally abused: Not on file    Physically abused: Not on file    Forced sexual activity: Not on file  Other Topics Concern  . Not on file  Social History Narrative  . Not on file    Past Surgical History:  Procedure Laterality Date  . CESAREAN SECTION     x4  . CHOLECYSTECTOMY    . PORTACATH PLACEMENT Right w-3  . reverse tubal ligation    . TEE WITHOUT CARDIOVERSION N/A 09/10/2016   Procedure: TRANSESOPHAGEAL ECHOCARDIOGRAM (TEE);  Surgeon: Thurmon FairMihai Croitoru, MD;  Location: [facility] END[facility]COPY;  Service: Cardiovascular;  Laterality: N/A;    Immunization History  Administered Date(s) Administered  . Influenza,inj,Quad PF,6+ Mos 08/12/2015, 07/30/2016, 06/16/2017  . Pneumococcal Polysaccharide-23 03/13/2015  . Tdap 02/07/2017    Current Meds  Medication Sig  . albuterol (PROVENTIL HFA;VENTOLIN HFA) 108 (90 Base) MCG/ACT inhaler Inhale 2 puffs into the lungs every 4 (four) hours as needed for wheezing or shortness of breath (cough, shortness of breath or wheezing.).  Marland Kitchen Ascorbic Acid (VITAMIN C) 1000 MG tablet Take 1,000 mg by mouth every evening.  Marland Kitchen aspirin EC 81 MG tablet Take 1 tablet by mouth every evening.   . diclofenac sodium (VOLTAREN) 1 % GEL Apply 2 g topically 4 (four) times daily.  Marland Kitchen EXJADE 500 MG disintegrating tablet Take 500 mg by mouth 4 (four) times daily.  . folic acid (FOLVITE) 1 MG tablet Take 1 mg by mouth daily.   .  hydrOXYzine (VISTARIL) 25 MG capsule Take 1 capsule (25 mg total) by mouth every 6 (six) hours as needed.  . lamoTRIgine (LAMICTAL) 200 MG tablet Take 1 tablet (200 mg total) by mouth 2 (two) times daily.  Marland Kitchen levETIRAcetam (KEPPRA) 1000 MG tablet Take 1 tablet (1,000 mg total) by mouth 2 (two) times daily.  Marland Kitchen lidocaine-prilocaine (EMLA) cream Apply 1 application topically as needed.  . meloxicam (MOBIC) 15 MG tablet Take 1 tablet (15 mg total) by mouth daily.  . Olopatadine HCl (PATADAY) 0.2 % SOLN Apply 1 drop to eye 2 (two) times daily as needed.  Marland Kitchen omeprazole (PRILOSEC) 40 MG capsule Take 1 capsule (40 mg total) by mouth daily.  . OXYCONTIN 30 MG 12 hr tablet Take 1 tablet (30 mg total) by mouth 2 (two) times daily.  . promethazine (PHENERGAN) 25 MG tablet Take 1 tablet (25 mg total) by mouth every 8 (eight) hours as needed for nausea or vomiting. 1 tablet every 8 hours prn  . zolpidem (AMBIEN) 5 MG tablet Take 1 tablet (5 mg total) by mouth at bedtime as needed. sleep  . [DISCONTINUED] lidocaine-prilocaine (EMLA) cream Apply 1 application as needed topically.   Allergies  Allergen Reactions  . Ibuprofen Hives  . Zofran [Ondansetron Hcl] Hives and Itching  . Demerol [Meperidine] Other (See Comments)    Pt has seizures  . Fentanyl And Related Nausea And Vomiting and Other (See Comments)    Very sedated  **Not allergic to the injection** JUST ALLERGIC TO PATCH  . Other Itching    Greek Yogurt  . Codeine Hives and Itching  . Hydrocodone Hives and Itching    BP 114/68 (BP Location: Left Arm, Patient Position: Sitting, Cuff Size: Small)   Pulse 100   Temp 98 F (36.7 C) (Oral)   Ht 5\' 4"  (1.626 m)   Wt 163 lb (73.9 kg)   SpO2 96%   BMI 27.98 kg/m    Review of Systems  Constitutional: Negative.   HENT: Negative.   Eyes: Negative.   Respiratory: Positive for shortness of breath (occassional).   Cardiovascular: Negative.   Gastrointestinal: Negative.   Endocrine: Negative.    Genitourinary: Negative.   Musculoskeletal:       Generalized joint pain.  Skin: Negative.   Allergic/Immunologic: Negative.   Neurological: Negative.   Hematological: Negative.   Psychiatric/Behavioral: Negative.    Objective:   Physical Exam  Constitutional: She is oriented to person, place, and time. She appears well-developed and well-nourished.  HENT:  Head: Normocephalic and atraumatic.  Right Ear: External ear normal.  Left Ear: External ear normal.  Nose: Nose normal.  Mouth/Throat: Oropharynx is clear and moist.  Eyes: Pupils are equal, round, and reactive to light. Conjunctivae and EOM are normal.  Neck: Normal range of motion. Neck supple.  Cardiovascular: Normal rate, regular rhythm, normal heart sounds and intact distal pulses.  Pulmonary/Chest: Effort normal and breath sounds normal.  Abdominal: Soft. Bowel sounds are normal.  Musculoskeletal:  Limited ROM in left extremities, r/t past history of Stroke.  Left foot/ankle brace.   Neurological: She is alert and oriented to person, place, and time.  Skin: Skin is warm and dry.  Psychiatric: She has a normal mood and affect. Her behavior is normal. Judgment and thought content normal.  Nursing note and vitals reviewed.   Assessment & Plan:   1. 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 Currently taking Folic Acid daily as prescribed. She will continue to take pain medications as prescribed.  She will continue to avoid extreme heat and cold.  She will continue to eat a healthy diet and drink at least 64 ounces of water daily.  She will avoid colds and flu.  She will get plenty of sleep and rest.  She will avoid high stressful situations that may trigger crisis.   - POCT urinalysis dipstick  2. Chronic, continuous use of opioids Pain is well controlled with pain medications. She will continue Oxycodone as prescribed.   3. Urinary tract infection without hematuria, site unspecified -  sulfamethoxazole-trimethoprim (BACTRIM DS,SEPTRA DS) 800-160 MG tablet; Take 1 tablet by mouth 2 (two) times daily.  Dispense: 14 tablet; Refill: 0  4. Abnormal urinalysis - Urine Culture  5. Nausea Stable today. She will continue Phenergan as needed.   6. Port-A-Cath in place Stable. No signs or symptoms of infection noted. Site is CD&I.  - lidocaine-prilocaine (EMLA) cream; Apply 1 application topically as needed.  Dispense: 30 g; Refill: 3  7. Follow up She will follow up in 6 weeks.   Meds ordered this encounter  Medications  . lidocaine-prilocaine (EMLA) cream    Sig: Apply 1 application topically as needed.    Dispense:  30 g    Refill:  3  . sulfamethoxazole-trimethoprim (BACTRIM DS,SEPTRA DS) 800-160 MG tablet    Sig: Take 1 tablet by mouth 2 (two) times daily.    Dispense:  14 tablet    Refill:  0   Debra Ip,  MSN, FNP-C Patient [facility] [address] [place], [place] [address] [phone]

## 2018-04-29 LAB — URINE CULTURE

## 2018-05-04 ENCOUNTER — Telehealth: Payer: Self-pay

## 2018-05-04 ENCOUNTER — Other Ambulatory Visit: Payer: Self-pay | Admitting: Family Medicine

## 2018-05-04 DIAGNOSIS — D571 Sickle-cell disease without crisis: Secondary | ICD-10-CM

## 2018-05-04 DIAGNOSIS — R110 Nausea: Secondary | ICD-10-CM

## 2018-05-04 MED ORDER — PROMETHAZINE HCL 25 MG PO TABS: 25.0000 mg | ORAL_TABLET | Freq: Three times a day (TID) | ORAL | 1 refills | Status: DC | PRN

## 2018-05-04 MED ORDER — OXYCODONE HCL 10 MG PO TABS: 10.0000 mg | ORAL_TABLET | ORAL | 0 refills | Status: DC

## 2018-05-04 MED ORDER — OXYCONTIN 30 MG PO T12A: 1.0000 | EXTENDED_RELEASE_TABLET | Freq: Two times a day (BID) | ORAL | 0 refills | Status: DC

## 2018-05-04 MED FILL — lamoTRIgine 200 MG TABS: 200 | 30 days supply | Qty: 60 | Fill #6

## 2018-05-04 MED FILL — PROMETHAZINE 25 MG TABLET: 25 | 10 days supply | Qty: 30 | Fill #0

## 2018-05-04 NOTE — Telephone Encounter (Signed)
Debra Williamson,    Please inform patient that Rxs for pain meds and Phenergan called to pharmacy today. She can pick up Oxycodone 30 mg ER on 05/09/2018.  Thanks.

## 2018-05-04 NOTE — Progress Notes (Signed)
Rx for Oxycodone, Oxycontin, and Phenergan to pharmacy today.

## 2018-05-05 NOTE — Telephone Encounter (Signed)
PA has been faxed for the Oxycodone on 05/05/2018.

## 2018-05-07 MED FILL — oxyCODONE HCL 10 MG TABS: 10 | 15 days supply | Qty: 90 | Fill #0

## 2018-05-09 MED FILL — OxyCONTIN 30 MG T12A: 30 | 30 days supply | Qty: 60 | Fill #0

## 2018-05-11 MED FILL — levETIRAcetam 1000 MG TABS: 1000 | 30 days supply | Qty: 60 | Fill #6

## 2018-05-15 ENCOUNTER — Other Ambulatory Visit: Payer: Self-pay | Admitting: Family Medicine

## 2018-05-15 MED FILL — EXJADE 500 MG TABLET: 500 | 30 days supply | Qty: 90 | Fill #3

## 2018-05-15 MED FILL — hydrOXYzine HCL 25 MG TABS: 25 | 15 days supply | Qty: 60 | Fill #0

## 2018-05-22 DIAGNOSIS — D571 Sickle-cell disease without crisis: Secondary | ICD-10-CM | POA: Diagnosis not present

## 2018-05-22 DIAGNOSIS — Z7689 Persons encountering health services in other specified circumstances: Secondary | ICD-10-CM | POA: Diagnosis not present

## 2018-06-08 ENCOUNTER — Encounter: Payer: Self-pay | Admitting: Family Medicine

## 2018-06-08 ENCOUNTER — Ambulatory Visit (INDEPENDENT_AMBULATORY_CARE_PROVIDER_SITE_OTHER): Payer: Medicaid Other | Admitting: Family Medicine

## 2018-06-08 VITALS — BP 106/69 | HR 79 | Temp 98.0°F | Ht 64.0 in | Wt 163.0 lb

## 2018-06-08 DIAGNOSIS — R0602 Shortness of breath: Secondary | ICD-10-CM | POA: Diagnosis not present

## 2018-06-08 DIAGNOSIS — Z09 Encounter for follow-up examination after completed treatment for conditions other than malignant neoplasm: Secondary | ICD-10-CM

## 2018-06-08 DIAGNOSIS — D571 Sickle-cell disease without crisis: Secondary | ICD-10-CM | POA: Diagnosis not present

## 2018-06-08 DIAGNOSIS — Z23 Encounter for immunization: Secondary | ICD-10-CM

## 2018-06-08 DIAGNOSIS — G894 Chronic pain syndrome: Secondary | ICD-10-CM | POA: Diagnosis not present

## 2018-06-08 DIAGNOSIS — R531 Weakness: Secondary | ICD-10-CM

## 2018-06-08 LAB — POCT URINALYSIS DIP (MANUAL ENTRY)
Bilirubin, UA: NEGATIVE
Blood, UA: NEGATIVE
Glucose, UA: NEGATIVE mg/dL
Ketones, POC UA: NEGATIVE mg/dL
Leukocytes, UA: NEGATIVE
Nitrite, UA: NEGATIVE
Protein Ur, POC: NEGATIVE mg/dL
Spec Grav, UA: 1.015 (ref 1.010–1.025)
Urobilinogen, UA: 0.2 E.U./dL
pH, UA: 6 (ref 5.0–8.0)

## 2018-06-08 MED ORDER — MELOXICAM 15 MG PO TABS: 15.0000 mg | ORAL_TABLET | Freq: Every day | ORAL | 3 refills | Status: DC

## 2018-06-08 MED FILL — MELOXICAM 15 MG TABLET: 15 | 30 days supply | Qty: 30 | Fill #0

## 2018-06-08 NOTE — Progress Notes (Signed)
Sickle Cell Anemia Follow Up  Subjective:    Patient ID: Debra Williamson, female    DOB: 06/14/1981, 37 y.o.   MRN: [id number]   Chief Complaint  Patient presents with  . Follow-up    sickle cell    HPI [name] has a past medical history of Sickle Cell Anemia, Stroke, Seizures, and Anemia. She is here today for follow up.   Current Status: Since her last office visit, she has noticed that the pain level in her joints and back are increasing, and that she is having pain more often.  She has moderate pain today. She continues to take Voltaren and NSAIDS as needed for pain. She had not had Mobic refilled. She reports occasional shortness of breath.  Since her last office visit, she is doing well with no complaints. She states that she has pain in her joints and back. She rates her pain today at 0/10. She has not has a hospital visit for Sickle Cell Crisis since 10/26/2015 where she wa treated and discharged on 10/28/2017. She is currently taking all medications as prescribed and staying well hydrated. She reports occasional shortness of breath.   She denies fevers, chills, fatigue, recent infections, weight loss, and night sweats.   She has not had any headaches, visual changes, dizziness, and falls.   No chest pain, heart palpitations, and cough reported.   No reports of GI problems such as nausea, vomiting, diarrhea, and constipation. She has no reports of blood in stools, dysuria and hematuria. No depression or anxiety reported.    Past Medical History:  Diagnosis Date  . Anemia   . Blood transfusion without reported diagnosis   . Seizures (HCC)    one time incident, may have been r/t to a pain medication she was prescribed  . Sickle cell disease (HCC)   . Stroke [facility](HCC)     Family History  Problem Relation Age of Onset  . HIV/AIDS Father     Social History   Socioeconomic History  . Marital status: Married    Spouse name: Not on file  . Number of children: 4  . Years of  education: Not on file  . Highest education level: Not on file  Occupational History  . Occupation: Doesn't work  Engineer, productionocial Needs  . Financial resource strain: Not on file  . Food insecurity:    Worry: Not on file    Inability: Not on file  . Transportation needs:    Medical: Not on file    Non-medical: Not on file  Tobacco Use  . Smoking status: Never Smoker  . Smokeless tobacco: Never Used  Substance and Sexual Activity  . Alcohol use: No    Alcohol/week: 0.0 standard drinks    Comment: occasionally  . Drug use: No  . Sexual activity: Yes  Lifestyle  . Physical activity:    Days per week: Not on file    Minutes per session: Not on file  . Stress: Not on file  Relationships  . Social connections:    Talks on phone: Not on file    Gets together: Not on file    Attends religious service: Not on file    Active member of club or organization: Not on file    Attends meetings of clubs or organizations: Not on file    Relationship status: Not on file  . Intimate partner violence:    Fear of current or ex partner: Not on file    Emotionally abused: Not on  file    Physically abused: Not on file    Forced sexual activity: Not on file  Other Topics Concern  . Not on file  Social History Narrative  . Not on file    Past Surgical History:  Procedure Laterality Date  . CESAREAN SECTION     x4  . CHOLECYSTECTOMY    . PORTACATH PLACEMENT Right w-3  . reverse tubal ligation    . TEE WITHOUT CARDIOVERSION N/A 09/10/2016   Procedure: TRANSESOPHAGEAL ECHOCARDIOGRAM (TEE);  Surgeon: Thurmon Fair, MD;  Location: [facility] ENDOSCOPY;  Service: Cardiovascular;  Laterality: N/A;    Immunization History  Administered Date(s) Administered  . Influenza,inj,Quad PF,6+ Mos 08/12/2015, 07/30/2016, 06/16/2017, 07/11/2017, 06/08/2018  . Influenza,inj,Quad PF,6-35 Mos 08/13/2013  . Meningococcal Conjugate 02/06/2018  . Pneumococcal Polysaccharide-23 03/13/2015  . Tdap 02/07/2017    Current  Meds  Medication Sig  . albuterol (PROVENTIL HFA;VENTOLIN HFA) 108 (90 Base) MCG/ACT inhaler Inhale 2 puffs into the lungs every 4 (four) hours as needed for wheezing or shortness of breath (cough, shortness of breath or wheezing.).  Marland Kitchen Ascorbic Acid (VITAMIN C) 1000 MG tablet Take 1,000 mg by mouth every evening.  Marland Kitchen aspirin EC 81 MG tablet Take 1 tablet by mouth every evening.   . diclofenac sodium (VOLTAREN) 1 % GEL Apply 2 g topically 4 (four) times daily.  Marland Kitchen EXJADE 500 MG disintegrating tablet Take 500 mg by mouth 4 (four) times daily.  . folic acid (FOLVITE) 1 MG tablet Take 1 mg by mouth daily.   . hydrOXYzine (VISTARIL) 25 MG capsule TAKE 1 CAPSULE BY MOUTH EVERY 6 HOURS AS NEEDED  . lamoTRIgine (LAMICTAL) 200 MG tablet Take 1 tablet (200 mg total) by mouth 2 (two) times daily.  Marland Kitchen levETIRAcetam (KEPPRA) 1000 MG tablet Take 1 tablet (1,000 mg total) by mouth 2 (two) times daily.  Marland Kitchen lidocaine-prilocaine (EMLA) cream Apply 1 application topically as needed.  . meloxicam (MOBIC) 15 MG tablet Take 1 tablet (15 mg total) by mouth daily.  Marland Kitchen omeprazole (PRILOSEC) 40 MG capsule Take 1 capsule (40 mg total) by mouth daily.  . promethazine (PHENERGAN) 25 MG tablet Take 1 tablet (25 mg total) by mouth every 8 (eight) hours as needed for nausea or vomiting. 1 tablet every 8 hours prn  . [DISCONTINUED] meloxicam (MOBIC) 15 MG tablet Take 1 tablet (15 mg total) by mouth daily.    Allergies  Allergen Reactions  . Ibuprofen Hives  . Zofran [Ondansetron Hcl] Hives and Itching  . Demerol [Meperidine] Other (See Comments)    Pt has seizures  . Fentanyl And Related Nausea And Vomiting and Other (See Comments)    Very sedated  **Not allergic to the injection** JUST ALLERGIC TO PATCH  . Other Itching    Greek Yogurt  . Codeine Hives and Itching  . Hydrocodone Hives and Itching    BP 106/69 (BP Location: Left Arm, Patient Position: Sitting, Cuff Size: Small)   Pulse 79   Temp 98 F (36.7 C) (Oral)    Ht 5\' 4"  (1.626 m)   Wt 163 lb (73.9 kg)   SpO2 98%   BMI 27.98 kg/m   Review of Systems  Constitutional: Negative.   Respiratory: Positive for shortness of breath (Ocassional).   Cardiovascular: Negative.   Gastrointestinal: Negative.   Genitourinary: Negative.   Musculoskeletal: Positive for arthralgias (Increased joint and back pain).  Skin: Negative.   Neurological: Positive for weakness (Residual left sided weakness r/t hx of stroke.).  Psychiatric/Behavioral: Negative.  Objective:   Physical Exam  Constitutional: She appears well-developed and well-nourished.  HENT:  Head: Normocephalic and atraumatic.  Cardiovascular: Normal rate, regular rhythm, normal heart sounds and intact distal pulses.  Pulmonary/Chest: Effort normal and breath sounds normal.  Abdominal: Soft. Bowel sounds are normal.  Neurological: She exhibits abnormal muscle tone. Coordination abnormal.  Left sided weakness, r/t hx of stroke.   Skin: Skin is warm and dry.  Psychiatric: She has a normal mood and affect. Her behavior is normal. Judgment and thought content normal.  Nursing note and vitals reviewed.  Assessment & Plan:   1. Hb-SS disease without crisis (HCC) Stable. Reports that pain is happening more often and is more severe. She will continue to take pain medications as prescribed; will continue to avoid extreme heat and cold; will continue to eat a healthy diet and drink at least 64 ounces of water daily; will avoid colds and flu; will continue to get plenty of sleep and rest; will continue to avoid high stressful situations and remain infection free; will continue Folic Acid 1 mg daily to avoid sickle cell crisis.   - POCT urinalysis dipstick - meloxicam (MOBIC) 15 MG tablet; Take 1 tablet (15 mg total) by mouth daily.  Dispense: 30 tablet; Refill: 3  2. Chronic pain syndrome Refill Mobic today. She will continue to take as prescribed for pain. Patient will contact office if pain worsens.   - meloxicam (MOBIC) 15 MG tablet; Take 1 tablet (15 mg total) by mouth daily.  Dispense: 30 tablet; Refill: 3  3. Left-sided weakness Residual r/t hx of Stroke.  4. Shortness of breath Stable today. Continue Albuterol inhaler as needed.   5. Need for immunization against influenza Injection administered in office today.  - Flu Vaccine QUAD 36+ mos IM  6. Follow up She will follow up in 3 months.   Meds ordered this encounter  Medications  . meloxicam (MOBIC) 15 MG tablet    Sig: Take 1 tablet (15 mg total) by mouth daily.    Dispense:  30 tablet    Refill:  3    Raliegh IpNatalie Leani Myron,  MSN, FNP-C Patient [facility] [address]  [place], [place] [address] [phone]

## 2018-06-15 ENCOUNTER — Other Ambulatory Visit: Payer: Self-pay

## 2018-06-16 ENCOUNTER — Other Ambulatory Visit: Payer: Self-pay | Admitting: Family Medicine

## 2018-06-16 DIAGNOSIS — D571 Sickle-cell disease without crisis: Secondary | ICD-10-CM

## 2018-06-16 DIAGNOSIS — R110 Nausea: Secondary | ICD-10-CM

## 2018-06-16 MED ORDER — OXYCODONE HCL 10 MG PO TABS: 10.0000 mg | ORAL_TABLET | ORAL | 0 refills | Status: DC

## 2018-06-16 MED ORDER — OXYCONTIN 30 MG PO T12A: 1.0000 | EXTENDED_RELEASE_TABLET | Freq: Two times a day (BID) | ORAL | 0 refills | Status: DC

## 2018-06-16 MED ORDER — PROMETHAZINE HCL 25 MG PO TABS: 25.0000 mg | ORAL_TABLET | Freq: Three times a day (TID) | ORAL | 2 refills | Status: DC | PRN

## 2018-06-16 MED FILL — PROMETHAZINE 25 MG TABLET: 25 | 10 days supply | Qty: 30 | Fill #0

## 2018-06-16 MED FILL — oxyCODONE HCL 10 MG TABS: 10 | 15 days supply | Qty: 90 | Fill #0

## 2018-06-16 NOTE — Telephone Encounter (Signed)
Done

## 2018-06-16 NOTE — Progress Notes (Signed)
Rx for Phenergan, Oxy-IR, and Oxycodone to pharmacy today.

## 2018-06-17 NOTE — Telephone Encounter (Signed)
[facility] and normally takes about 24hrs before approval. Pharmacy should be able to run back through system on tomorrow.

## 2018-06-18 MED FILL — OxyCONTIN 30 MG T12A: 30 | 30 days supply | Qty: 60 | Fill #0

## 2018-06-24 ENCOUNTER — Telehealth: Payer: Self-pay

## 2018-06-24 NOTE — Telephone Encounter (Signed)
Message not needed. °

## 2018-06-26 DIAGNOSIS — Z7689 Persons encountering health services in other specified circumstances: Secondary | ICD-10-CM | POA: Diagnosis not present

## 2018-06-26 DIAGNOSIS — D571 Sickle-cell disease without crisis: Secondary | ICD-10-CM | POA: Diagnosis not present

## 2018-06-30 ENCOUNTER — Ambulatory Visit: Payer: Medicaid Other | Admitting: Family Medicine

## 2018-07-01 ENCOUNTER — Ambulatory Visit: Payer: Medicaid Other | Admitting: Family Medicine

## 2018-07-02 ENCOUNTER — Other Ambulatory Visit: Payer: Self-pay | Admitting: Family Medicine

## 2018-07-03 MED FILL — levETIRAcetam 1000 MG TABS: 1000 | 90 days supply | Qty: 180 | Fill #0

## 2018-07-03 MED FILL — SUBVENITE 200 MG TABS: 200 | 90 days supply | Qty: 180 | Fill #0

## 2018-07-07 ENCOUNTER — Encounter: Payer: Self-pay | Admitting: Family Medicine

## 2018-07-07 ENCOUNTER — Ambulatory Visit (INDEPENDENT_AMBULATORY_CARE_PROVIDER_SITE_OTHER): Payer: Medicaid Other | Admitting: Family Medicine

## 2018-07-07 VITALS — BP 124/72 | HR 90 | Temp 98.4°F | Ht 64.0 in | Wt 162.3 lb

## 2018-07-07 DIAGNOSIS — H9202 Otalgia, left ear: Secondary | ICD-10-CM

## 2018-07-07 DIAGNOSIS — D571 Sickle-cell disease without crisis: Secondary | ICD-10-CM

## 2018-07-07 DIAGNOSIS — G894 Chronic pain syndrome: Secondary | ICD-10-CM | POA: Diagnosis not present

## 2018-07-07 DIAGNOSIS — Z09 Encounter for follow-up examination after completed treatment for conditions other than malignant neoplasm: Secondary | ICD-10-CM

## 2018-07-07 DIAGNOSIS — F1190 Opioid use, unspecified, uncomplicated: Secondary | ICD-10-CM

## 2018-07-07 NOTE — Progress Notes (Signed)
Sick Visit  Subjective:    Patient ID: Debra Williamson, female    DOB: [birth year], 37 y.o.   MRN: [id number]  Chief Complaint  Patient presents with  . Ear Pain    LEFT   HPI  Debra Williamson is a 37 year old female with a past medical history of Sickle Cell Disease, Stroke, Seizures, and Anemia. She is here today for a sick visit.    Current Status: She is having mild left ear discomfort. She states that her symptoms are a feeling of 'water in her hear.' She denies pain, soreness, discharge, and swelling.   She reports that her apartment has mold, mildew, damp places on the ceilings in most rooms of the house. She has reported this to her landlord, with ineffective repairs. She feels that this is effecting her health.  Since her last office visit, she is doing well. She does have c/o of left ear discomfort. She states that she has pain in joints.  She rates her pain today at 3/10. She has not has a hospital visit for Sickle Cell Crisis since 10/26/2015 where she treated and discharged on 10/29/2015. She is currently taking all medications as prescribed and staying well hydrated. She reports occasional dizziness and headaches.   She denies fevers, chills, fatigue, recent infections, weight loss, and night sweats. She has not had any visual changes, and falls. No chest pain, heart palpitations, cough and shortness of breath reported. No reports of GI problems such as nausea, vomiting, diarrhea, and constipation. She has no reports of blood in stools, dysuria and hematuria. No depression or anxiety reported.  Past Medical History:  Diagnosis Date  . Anemia   . Blood transfusion without reported diagnosis   . Seizures (HCC)    one time incident, may have been r/t to a pain medication she was prescribed  . Sickle cell disease (HCC)   . Stroke [facility])     Family History  Problem Relation Age of Onset  . HIV/AIDS Father     Social History   Socioeconomic History  . Marital status: Married    Spouse name: Not on file  . Number of children: 4  . Years of education: Not on file  . Highest education level: Not on file  Occupational History  . Occupation: Doesn't work  Engineer, production  . Financial resource strain: Not on file  . Food insecurity:    Worry: Not on file    Inability: Not on file  . Transportation needs:    Medical: Not on file    Non-medical: Not on file  Tobacco Use  . Smoking status: Never Smoker  . Smokeless tobacco: Never Used  Substance and Sexual Activity  . Alcohol use: No    Alcohol/week: 0.0 standard drinks    Comment: occasionally  . Drug use: No  . Sexual activity: Yes  Lifestyle  . Physical activity:    Days per week: Not on file    Minutes per session: Not on file  . Stress: Not on file  Relationships  . Social connections:    Talks on phone: Not on file    Gets together: Not on file    Attends religious service: Not on file    Active member of club or organization: Not on file    Attends meetings of clubs or organizations: Not on file    Relationship status: Not on file  . Intimate partner violence:    Fear of current or ex partner: Not on  file    Emotionally abused: Not on file    Physically abused: Not on file    Forced sexual activity: Not on file  Other Topics Concern  . Not on file  Social History Narrative  . Not on file    Past Surgical History:  Procedure Laterality Date  . CESAREAN SECTION     x4  . CHOLECYSTECTOMY    . PORTACATH PLACEMENT Right w-3  . reverse tubal ligation    . TEE WITHOUT CARDIOVERSION N/A 09/10/2016   Procedure: TRANSESOPHAGEAL ECHOCARDIOGRAM (TEE);  Surgeon: Thurmon Fair, MD;  Location: [facility] ENDOSCOPY;  Service: Cardiovascular;  Laterality: N/A;    Immunization History  Administered Date(s) Administered  . Influenza,inj,Quad PF,6+ Mos 08/12/2015, 07/30/2016, 06/16/2017, 07/11/2017, 06/08/2018  . Influenza,inj,Quad PF,6-35 Mos 08/13/2013  . Meningococcal Conjugate 02/06/2018  . Pneumococcal  Polysaccharide-23 03/13/2015  . Tdap 02/07/2017    Current Meds  Medication Sig  . albuterol (PROVENTIL HFA;VENTOLIN HFA) 108 (90 Base) MCG/ACT inhaler Inhale 2 puffs into the lungs every 4 (four) hours as needed for wheezing or shortness of breath (cough, shortness of breath or wheezing.).  Marland Kitchen Ascorbic Acid (VITAMIN C) 1000 MG tablet Take 1,000 mg by mouth every evening.  Marland Kitchen aspirin EC 81 MG tablet Take 1 tablet by mouth every evening.   . diclofenac sodium (VOLTAREN) 1 % GEL Apply 2 g topically 4 (four) times daily.  Marland Kitchen EXJADE 500 MG disintegrating tablet Take 500 mg by mouth 4 (four) times daily.  . folic acid (FOLVITE) 1 MG tablet Take 1 mg by mouth daily.   . hydrOXYzine (VISTARIL) 25 MG capsule TAKE 1 CAPSULE BY MOUTH EVERY 6 HOURS AS NEEDED  . lamoTRIgine (LAMICTAL) 200 MG tablet TAKE 1 TABLET (200 MG TOTAL) BY MOUTH 2 TIMES DAILY.  Marland Kitchen levETIRAcetam (KEPPRA) 1000 MG tablet TAKE 1 TABLET BY MOUTH 2 TIMES DAILY.  Marland Kitchen lidocaine-prilocaine (EMLA) cream Apply 1 application topically as needed.  . meloxicam (MOBIC) 15 MG tablet Take 1 tablet (15 mg total) by mouth daily.  . Olopatadine HCl (PATADAY) 0.2 % SOLN Apply 1 drop to eye 2 (two) times daily as needed.  Marland Kitchen omeprazole (PRILOSEC) 40 MG capsule Take 1 capsule (40 mg total) by mouth daily.  . OXYCONTIN 30 MG 12 hr tablet Take 1 tablet (30 mg total) by mouth 2 (two) times daily.  . promethazine (PHENERGAN) 25 MG tablet Take 1 tablet (25 mg total) by mouth every 8 (eight) hours as needed for nausea or vomiting. 1 tablet every 8 hours prn  . zolpidem (AMBIEN) 5 MG tablet Take 1 tablet (5 mg total) by mouth at bedtime as needed. sleep    Allergies  Allergen Reactions  . Ibuprofen Hives  . Zofran [Ondansetron Hcl] Hives and Itching  . Demerol [Meperidine] Other (See Comments)    Pt has seizures  . Fentanyl And Related Nausea And Vomiting and Other (See Comments)    Very sedated  **Not allergic to the injection** JUST ALLERGIC TO PATCH  .  Other Itching    Greek Yogurt  . Codeine Hives and Itching  . Hydrocodone Hives and Itching    BP 124/72 (BP Location: Left Arm, Patient Position: Sitting, Cuff Size: Small)   Pulse 90   Temp 98.4 F (36.9 C) (Oral)   Ht 5\' 4"  (1.626 m)   Wt 162 lb 4.8 oz (73.6 kg)   SpO2 96%   BMI 27.86 kg/m    Review of Systems  HENT: Negative.  Ear discomfort  Respiratory: Negative.   Cardiovascular: Negative.   Gastrointestinal: Negative.   Endocrine: Negative.   Genitourinary: Negative.   Musculoskeletal: Positive for arthralgias (generalized joint pain).  Skin: Negative.   Neurological: Negative.   Hematological: Negative.   Psychiatric/Behavioral: Negative.    Objective:   Physical Exam  Constitutional: She is oriented to person, place, and time. She appears well-developed and well-nourished.  HENT:  Head: Normocephalic and atraumatic.  Right Ear: External ear normal.  Left Ear: External ear normal.  Nose: Nose normal.  Mouth/Throat: Oropharynx is clear and moist.  Eyes: Pupils are equal, round, and reactive to light. Conjunctivae and EOM are normal.  Neck: Normal range of motion. Neck supple.  Cardiovascular: Normal rate, regular rhythm, normal heart sounds and intact distal pulses.  Pulmonary/Chest: Effort normal and breath sounds normal.  Abdominal: Soft. Bowel sounds are normal.  Musculoskeletal: Normal range of motion.  Neurological: She is alert and oriented to person, place, and time.  Skin: Skin is warm and dry. Capillary refill takes less than 2 seconds.  Psychiatric: She has a normal mood and affect. Her behavior is normal. Judgment and thought content normal.  Nursing note and vitals reviewed.  Assessment & Plan:   1. Discomfort of left ear She is not experiencing pain, swelling, or discomfort. We will continue observation as prescribed.   2. Hb-SS disease without crisis [facility](HCC) She is doing well today. She will continue to take pain medications as  prescribed; will continue to avoid extreme heat and cold; will continue to eat a healthy diet and drink at least 64 ounces of water daily; will avoid colds and flu; will continue to get plenty of sleep and rest; will continue to avoid high stressful situations and remain infection free; will continue Folic Acid 1 mg daily to avoid sickle cell crisis.   3. Chronic, continuous use of opioids Pain is stable. Continue Oxycontin as prescribed.   4. Chronic pain syndrome  5. Follow up She will keep follow up appointment 08/2018.   No orders of the defined types were placed in this encounter.   Raliegh IpNatalie Carrie Schoonmaker,  MSN, FNP-C Patient [facility] [address]  [place], [place] [address] [phone]

## 2018-07-21 ENCOUNTER — Other Ambulatory Visit: Payer: Self-pay | Admitting: Family Medicine

## 2018-07-21 NOTE — Progress Notes (Signed)
Letter given to patient concerning hazards at home.

## 2018-07-24 DIAGNOSIS — D571 Sickle-cell disease without crisis: Secondary | ICD-10-CM | POA: Diagnosis not present

## 2018-07-24 DIAGNOSIS — Z7689 Persons encountering health services in other specified circumstances: Secondary | ICD-10-CM | POA: Diagnosis not present

## 2018-07-27 ENCOUNTER — Telehealth: Payer: Self-pay

## 2018-07-28 ENCOUNTER — Other Ambulatory Visit: Payer: Self-pay | Admitting: Family Medicine

## 2018-07-28 DIAGNOSIS — D571 Sickle-cell disease without crisis: Secondary | ICD-10-CM

## 2018-07-28 DIAGNOSIS — R110 Nausea: Secondary | ICD-10-CM

## 2018-07-28 MED ORDER — OXYCODONE HCL 10 MG PO TABS: 10.0000 mg | ORAL_TABLET | ORAL | 0 refills | Status: DC

## 2018-07-28 MED ORDER — OXYCONTIN 30 MG PO T12A: 1.0000 | EXTENDED_RELEASE_TABLET | Freq: Two times a day (BID) | ORAL | 0 refills | Status: DC

## 2018-07-28 MED ORDER — PROMETHAZINE HCL 25 MG PO TABS: 25.0000 mg | ORAL_TABLET | Freq: Three times a day (TID) | ORAL | 2 refills | Status: DC | PRN

## 2018-07-28 MED FILL — PROMETHAZINE 25 MG TABLET: 25 | 10 days supply | Qty: 30 | Fill #0

## 2018-07-28 MED FILL — oxyCODONE HCL 10 MG TABS: 10 | 15 days supply | Qty: 90 | Fill #0

## 2018-07-28 MED FILL — OxyCONTIN 30 MG T12A: 30 | 30 days supply | Qty: 60 | Fill #0

## 2018-07-28 NOTE — Progress Notes (Signed)
Rx for Phenergan, Oxy-IR, and Oxy-ER sent to pharmacy today.

## 2018-08-14 DIAGNOSIS — Z7689 Persons encountering health services in other specified circumstances: Secondary | ICD-10-CM | POA: Diagnosis not present

## 2018-08-14 DIAGNOSIS — D571 Sickle-cell disease without crisis: Secondary | ICD-10-CM | POA: Diagnosis not present

## 2018-08-14 MED FILL — JADENU 360 MG TABLET: 360 | 30 days supply | Qty: 60 | Fill #0

## 2018-08-24 ENCOUNTER — Telehealth: Payer: Self-pay

## 2018-08-24 ENCOUNTER — Other Ambulatory Visit: Payer: Self-pay | Admitting: Family Medicine

## 2018-08-24 DIAGNOSIS — G894 Chronic pain syndrome: Secondary | ICD-10-CM

## 2018-08-24 DIAGNOSIS — F1190 Opioid use, unspecified, uncomplicated: Secondary | ICD-10-CM

## 2018-08-24 DIAGNOSIS — D571 Sickle-cell disease without crisis: Secondary | ICD-10-CM

## 2018-08-24 MED ORDER — OXYCODONE HCL 10 MG PO TABS: 10.0000 mg | ORAL_TABLET | ORAL | 0 refills | Status: DC

## 2018-08-24 MED ORDER — OXYCONTIN 30 MG PO T12A: 1.0000 | EXTENDED_RELEASE_TABLET | Freq: Two times a day (BID) | ORAL | 0 refills | Status: DC

## 2018-08-24 MED FILL — oxyCODONE HCL 10 MG TABS: 10 | 15 days supply | Qty: 90 | Fill #0

## 2018-08-24 NOTE — Progress Notes (Unsigned)
Rx for Oxy-IR and Oxy-ER sent to pharmacy today. Oxy-ER not to refilled prior to 08/27/2018.

## 2018-08-25 NOTE — Telephone Encounter (Signed)
-----   Message from Kallie Locks, FNP sent at 08/24/2018  3:19 PM EST ----- Regarding: "Refills" Rx for Oxy-IR and Oxy-ER sent to pharmacy today. Oxy-ER not to refilled prior to 08/27/2018. Please inform patient. Thank you.

## 2018-08-25 NOTE — Telephone Encounter (Signed)
Patient notified

## 2018-08-27 MED FILL — PROMETHAZINE 25 MG TABLET: 25 | 10 days supply | Qty: 30 | Fill #1

## 2018-08-27 MED FILL — LIDOCAINE-PRILOCAINE CREAM: 2.5-2.5 | 30 days supply | Qty: 30 | Fill #1

## 2018-08-27 MED FILL — hydrOXYzine HCL 25 MG TABS: 25 | 15 days supply | Qty: 60 | Fill #1

## 2018-08-27 MED FILL — OxyCONTIN 30 MG T12A: 30 | 30 days supply | Qty: 60 | Fill #0

## 2018-09-08 ENCOUNTER — Ambulatory Visit (INDEPENDENT_AMBULATORY_CARE_PROVIDER_SITE_OTHER): Payer: Medicaid Other | Admitting: Family Medicine

## 2018-09-08 ENCOUNTER — Encounter: Payer: Self-pay | Admitting: Family Medicine

## 2018-09-08 VITALS — BP 113/60 | HR 86 | Temp 98.5°F | Resp 16 | Ht 64.0 in | Wt 165.0 lb

## 2018-09-08 DIAGNOSIS — F1190 Opioid use, unspecified, uncomplicated: Secondary | ICD-10-CM

## 2018-09-08 DIAGNOSIS — H6690 Otitis media, unspecified, unspecified ear: Secondary | ICD-10-CM | POA: Diagnosis not present

## 2018-09-08 DIAGNOSIS — R8290 Unspecified abnormal findings in urine: Secondary | ICD-10-CM | POA: Diagnosis not present

## 2018-09-08 DIAGNOSIS — D571 Sickle-cell disease without crisis: Secondary | ICD-10-CM

## 2018-09-08 DIAGNOSIS — H9202 Otalgia, left ear: Secondary | ICD-10-CM | POA: Diagnosis not present

## 2018-09-08 DIAGNOSIS — G894 Chronic pain syndrome: Secondary | ICD-10-CM

## 2018-09-08 DIAGNOSIS — N390 Urinary tract infection, site not specified: Secondary | ICD-10-CM

## 2018-09-08 DIAGNOSIS — Z09 Encounter for follow-up examination after completed treatment for conditions other than malignant neoplasm: Secondary | ICD-10-CM

## 2018-09-08 LAB — POCT URINALYSIS DIP (MANUAL ENTRY)
Bilirubin, UA: NEGATIVE
Glucose, UA: NEGATIVE mg/dL
Ketones, POC UA: NEGATIVE mg/dL
Nitrite, UA: NEGATIVE
Protein Ur, POC: 30 mg/dL — AB
Spec Grav, UA: 1.025 (ref 1.010–1.025)
Urobilinogen, UA: 1 E.U./dL
pH, UA: 5.5 (ref 5.0–8.0)

## 2018-09-08 MED ORDER — AMOXICILLIN-POT CLAVULANATE 875-125 MG PO TABS: 1.0000 | ORAL_TABLET | Freq: Two times a day (BID) | ORAL | 0 refills | Status: AC

## 2018-09-08 NOTE — Progress Notes (Signed)
Sickle Cell Anemia Follow Up  Subjective:    Patient ID: Debra Williamson, female    DOB: 23-Dec-1980, 37 y.o.   MRN: [id number]   Chief Complaint  Patient presents with  . Sickle Cell Anemia    HPI  Debra Williamson is a 37 year old female with a past medical history of Sickle Cell Anemia, Stroke, Seizures, and Anemia. She is here today for assessment and follow up of chronic diseases.   Current Status: Since her last office visit, she is doing well with no complaints. She states that she has pain in her back. She rates her pain today at 0/10. She has not has a hospital visit for Sickle Cell Crisis since 11/17/2017where he was treated and discharged the same day. He is currently taking all medications as prescribed and staying well hydrated. She reports occasional dizziness and headaches. She denies fevers, chills, fatigue, recent infections, weight loss, and night sweats. She has not had any headaches, visual changes, dizziness, and falls. No chest pain, heart palpitations, cough and shortness of breath reported. No reports of GI problems such as nausea, vomiting, diarrhea, and constipation. She has no reports of blood in stools, dysuria and hematuria. No depression or anxiety, and denies suicidal ideations, homicidal ideations, or auditory hallucinations. She denies pain today. She has c/o left ear pain and discomfort for about a week now.   Past Medical History:  Diagnosis Date  . Anemia   . Blood transfusion without reported diagnosis   . Seizures (HCC)    one time incident, may have been r/t to a pain medication she was prescribed  . Sickle cell disease (HCC)   . Stroke [facility])     Review of Systems  Constitutional: Negative.   HENT: Positive for ear pain.   Eyes: Negative.   Respiratory: Negative.   Cardiovascular: Negative.   Gastrointestinal: Negative.   Endocrine: Negative.   Genitourinary: Negative.   Musculoskeletal: Positive for back pain (chronic back pain).  Skin: Negative.    Allergic/Immunologic: Negative.   Neurological: Positive for dizziness and headaches.  Psychiatric/Behavioral: Negative.        Mild situational anxiety   Objective:   Physical Exam  HENT:  Left Ear: There is swelling and tenderness. Tympanic membrane is erythematous and bulging.  Ears:    Assessment & Plan:   1. Acute otitis media, unspecified otitis media type We will initiate antibiotic today for ear infection. - amoxicillin-clavulanate (AUGMENTIN) 875-125 MG tablet; Take 1 tablet by mouth 2 (two) times daily for 10 days.  Dispense: 20 tablet; Refill: 0  2. Acute ear pain, left  3. Hb-SS disease without crisis [facility]) She is doing well today. She will continue to take pain medications as prescribed; will continue to avoid extreme heat and cold; will continue to eat a healthy diet and drink at least 64 ounces of water daily; continue stool softener as needed; will avoid colds and flu; will continue to get plenty of sleep and rest; will continue to avoid high stressful situations and remain infection free; will continue Folic Acid 1 mg daily to avoid sickle cell crisis.  - POCT urinalysis dipstick  4. Chronic pain syndrome  5. Chronic, continuous use of opioids  6. Abnormal urine - Urine Culture  7. Urinary tract infection without hematuria, site unspecified  8. Abnormal urinalysis  9. Follow up She will follow up in 2 months.   Meds ordered this encounter  Medications  . amoxicillin-clavulanate (AUGMENTIN) 875-125 MG tablet    Sig:  Take 1 tablet by mouth 2 (two) times daily for 10 days.    Dispense:  20 tablet    Refill:  0    Raliegh IpNatalie Zymeir Salminen,  MSN, FNP-C Patient [facility] [address]  [place], [place] [address] [phone]

## 2018-09-08 NOTE — Progress Notes (Signed)
po

## 2018-09-08 NOTE — Patient Instructions (Signed)
Amoxicillin; Clavulanic Acid tablets What is this medicine? AMOXICILLIN; CLAVULANIC ACID (a mox i SIL in; KLAV yoo lan ic AS id) is a penicillin antibiotic. It is used to treat certain kinds of bacterial infections. It will not work for colds, flu, or other viral infections. This medicine may be used for other purposes; ask your health care provider or pharmacist if you have questions. COMMON BRAND NAME(S): Augmentin What should I tell my health care provider before I take this medicine? They need to know if you have any of these conditions: -bowel disease, like colitis -kidney disease -liver disease -mononucleosis -an unusual or allergic reaction to amoxicillin, penicillin, cephalosporin, other antibiotics, clavulanic acid, other medicines, foods, dyes, or preservatives -pregnant or trying to get pregnant -breast-feeding How should I use this medicine? Take this medicine by mouth with a full glass of water. Follow the directions on the prescription label. Take at the start of a meal. Do not crush or chew. If the tablet has a score line, you may cut it in half at the score line for easier swallowing. Take your medicine at regular intervals. Do not take your medicine more often than directed. Take all of your medicine as directed even if you think you are better. Do not skip doses or stop your medicine early. Talk to your pediatrician regarding the use of this medicine in children. Special care may be needed. Overdosage: If you think you have taken too much of this medicine contact a poison control center or emergency room at once. NOTE: This medicine is only for you. Do not share this medicine with others. What if I miss a dose? If you miss a dose, take it as soon as you can. If it is almost time for your next dose, take only that dose. Do not take double or extra doses. What may interact with this medicine? -allopurinol -anticoagulants -birth control pills -methotrexate -probenecid This  list may not describe all possible interactions. Give your health care provider a list of all the medicines, herbs, non-prescription drugs, or dietary supplements you use. Also tell them if you smoke, drink alcohol, or use illegal drugs. Some items may interact with your medicine. What should I watch for while using this medicine? Tell your doctor or health care professional if your symptoms do not improve. Do not treat diarrhea with over the counter products. Contact your doctor if you have diarrhea that lasts more than 2 days or if it is severe and watery. If you have diabetes, you may get a false-positive result for sugar in your urine. Check with your doctor or health care professional. Birth control pills may not work properly while you are taking this medicine. Talk to your doctor about using an extra method of birth control. What side effects may I notice from receiving this medicine? Side effects that you should report to your doctor or health care professional as soon as possible: -allergic reactions like skin rash, itching or hives, swelling of the face, lips, or tongue -breathing problems -dark urine -fever or chills, sore throat -redness, blistering, peeling or loosening of the skin, including inside the mouth -seizures -trouble passing urine or change in the amount of urine -unusual bleeding, bruising -unusually weak or tired -white patches or sores in the mouth or throat Side effects that usually do not require medical attention (report to your doctor or health care professional if they continue or are bothersome): -diarrhea -dizziness -headache -nausea, vomiting -stomach upset -vaginal or anal irritation This list may   not describe all possible side effects. Call your doctor for medical advice about side effects. You may report side effects to FDA at 1-800-FDA-1088. Where should I keep my medicine? Keep out of the reach of children. Store at room temperature below 25 degrees  C (77 degrees F). Keep container tightly closed. Throw away any unused medicine after the expiration date. NOTE: This sheet is a summary. It may not cover all possible information. If you have questions about this medicine, talk to your doctor, pharmacist, or health care provider.  2018 Elsevier/Gold Standard (2007-12-31 12:04:30)  

## 2018-09-10 LAB — URINE CULTURE

## 2018-09-18 MED FILL — LIDOCAINE-PRILOCAINE CREAM: 2.5-2.5 | 30 days supply | Qty: 30 | Fill #2

## 2018-09-22 MED FILL — AMOX-CLAV 875-125 MG TABLET: 875-125 | 10 days supply | Qty: 20 | Fill #0

## 2018-09-23 DIAGNOSIS — D571 Sickle-cell disease without crisis: Secondary | ICD-10-CM | POA: Diagnosis not present

## 2018-09-25 ENCOUNTER — Telehealth: Payer: Self-pay

## 2018-09-25 DIAGNOSIS — Z7689 Persons encountering health services in other specified circumstances: Secondary | ICD-10-CM | POA: Diagnosis not present

## 2018-09-25 MED FILL — JADENU 360 MG TABLET: 360 | 30 days supply | Qty: 60 | Fill #1

## 2018-09-30 ENCOUNTER — Other Ambulatory Visit: Payer: Self-pay | Admitting: Family Medicine

## 2018-09-30 DIAGNOSIS — D571 Sickle-cell disease without crisis: Secondary | ICD-10-CM

## 2018-09-30 DIAGNOSIS — G894 Chronic pain syndrome: Secondary | ICD-10-CM

## 2018-09-30 DIAGNOSIS — F1190 Opioid use, unspecified, uncomplicated: Secondary | ICD-10-CM

## 2018-09-30 DIAGNOSIS — R110 Nausea: Secondary | ICD-10-CM

## 2018-09-30 MED ORDER — OXYCODONE HCL 10 MG PO TABS: 10.0000 mg | ORAL_TABLET | ORAL | 0 refills | Status: DC

## 2018-09-30 MED ORDER — OXYCONTIN 30 MG PO T12A: 1.0000 | EXTENDED_RELEASE_TABLET | Freq: Two times a day (BID) | ORAL | 0 refills | Status: DC

## 2018-09-30 MED ORDER — PROMETHAZINE HCL 25 MG PO TABS: 25.0000 mg | ORAL_TABLET | Freq: Three times a day (TID) | ORAL | 2 refills | Status: DC | PRN

## 2018-09-30 MED FILL — PROMETHAZINE 25 MG TABLET: 25 | 10 days supply | Qty: 30 | Fill #2

## 2018-09-30 NOTE — Progress Notes (Unsigned)
Rx for Oxy-IR, Oxy-ER, and Phenergan sent to pharmacy today.

## 2018-10-01 MED FILL — oxyCODONE HCL 10 MG TABS: 10 | 15 days supply | Qty: 90 | Fill #0

## 2018-10-01 MED FILL — OxyCONTIN 30 MG T12A: 30 | 30 days supply | Qty: 60 | Fill #0

## 2018-10-01 NOTE — Telephone Encounter (Signed)
Patient notified

## 2018-10-01 NOTE — Telephone Encounter (Signed)
-----   Message from Kallie LocksNatalie M Stroud, FNP sent at 09/30/2018  8:04 PM EST ----- Regarding: "Refills" Rx for Oxy-IR, Oxy-ER, and Phenergan sent to pharmacy today. Please inform patient. Thanks.

## 2018-10-02 ENCOUNTER — Other Ambulatory Visit: Payer: Self-pay | Admitting: Family Medicine

## 2018-10-02 ENCOUNTER — Telehealth: Payer: Self-pay

## 2018-10-02 MED ORDER — HYDROXYZINE PAMOATE 25 MG PO CAPS: ORAL_CAPSULE | ORAL | 1 refills | Status: DC

## 2018-10-02 NOTE — Telephone Encounter (Signed)
Medication sent to pharmacy  

## 2018-10-19 ENCOUNTER — Other Ambulatory Visit: Payer: Self-pay

## 2018-10-19 MED ORDER — HYDROXYZINE HCL 25 MG PO TABS: 12.5000 mg | ORAL_TABLET | Freq: Three times a day (TID) | ORAL | 1 refills | Status: DC | PRN

## 2018-10-19 MED FILL — hydrOXYzine HCL 25 MG TABS: 25 | 20 days supply | Qty: 60 | Fill #0

## 2018-10-19 NOTE — Telephone Encounter (Signed)
Medication sent to pharmacy  

## 2018-10-20 ENCOUNTER — Ambulatory Visit (INDEPENDENT_AMBULATORY_CARE_PROVIDER_SITE_OTHER): Payer: Medicaid Other | Admitting: Family Medicine

## 2018-10-20 ENCOUNTER — Other Ambulatory Visit: Payer: Self-pay | Admitting: Family Medicine

## 2018-10-20 ENCOUNTER — Encounter: Payer: Self-pay | Admitting: Family Medicine

## 2018-10-20 VITALS — BP 124/66 | HR 74 | Temp 98.0°F | Ht 64.0 in | Wt 156.0 lb

## 2018-10-20 DIAGNOSIS — R300 Dysuria: Secondary | ICD-10-CM | POA: Diagnosis not present

## 2018-10-20 DIAGNOSIS — D571 Sickle-cell disease without crisis: Secondary | ICD-10-CM

## 2018-10-20 DIAGNOSIS — N898 Other specified noninflammatory disorders of vagina: Secondary | ICD-10-CM

## 2018-10-20 DIAGNOSIS — Z09 Encounter for follow-up examination after completed treatment for conditions other than malignant neoplasm: Secondary | ICD-10-CM

## 2018-10-20 DIAGNOSIS — G894 Chronic pain syndrome: Secondary | ICD-10-CM

## 2018-10-20 DIAGNOSIS — F1190 Opioid use, unspecified, uncomplicated: Secondary | ICD-10-CM

## 2018-10-20 DIAGNOSIS — R8290 Unspecified abnormal findings in urine: Secondary | ICD-10-CM

## 2018-10-20 DIAGNOSIS — G4700 Insomnia, unspecified: Secondary | ICD-10-CM

## 2018-10-20 DIAGNOSIS — N390 Urinary tract infection, site not specified: Secondary | ICD-10-CM

## 2018-10-20 DIAGNOSIS — R319 Hematuria, unspecified: Secondary | ICD-10-CM

## 2018-10-20 LAB — POCT URINALYSIS DIP (MANUAL ENTRY)
Glucose, UA: NEGATIVE mg/dL
Ketones, POC UA: NEGATIVE mg/dL
Nitrite, UA: NEGATIVE
Protein Ur, POC: 30 mg/dL — AB
Spec Grav, UA: 1.015 (ref 1.010–1.025)
Urobilinogen, UA: 4 E.U./dL — AB
pH, UA: 7 (ref 5.0–8.0)

## 2018-10-20 MED ORDER — ZOLPIDEM TARTRATE 5 MG PO TABS: 5.0000 mg | ORAL_TABLET | Freq: Every evening | ORAL | 0 refills | Status: DC | PRN

## 2018-10-20 MED ORDER — SULFAMETHOXAZOLE-TRIMETHOPRIM 800-160 MG PO TABS: 1.0000 | ORAL_TABLET | Freq: Two times a day (BID) | ORAL | 0 refills | Status: DC

## 2018-10-20 NOTE — Progress Notes (Signed)
Sickle Cell Anemia Sick Visit  Subjective:    Patient ID: Debra Williamson, female    DOB: [birth year], 37 y.o.   MRN: [id number]  Chief Complaint  Patient presents with  . Vaginal Discharge  . vaginal odor   HPI  Debra Williamson is a 37 year old female with a past medical history of Sickle Cell Anemia, Stroke, Seizures, and Anemia.Debra Williamson is here today for follow up.   Current Status: Since her last office visit, Debra Williamson is doing well with no complaints. Debra Williamson states that Debra Williamson usually has pain in her joints. Debra Williamson rates her pain today at 5/10. Debra Williamson has not has a hospital visit for Sickle Cell Crisis since 09/06/2016 where Debra Williamson treated and discharged the same day. Debra Williamson is currently taking all medications as prescribed and staying well hydrated. Debra Williamson reports occasional dizziness and headaches.   Debra Williamson denies fevers, chills, fatigue, recent infections, weight loss, and night sweats. Debra Williamson has not had any  visual changes, and falls. No chest pain, heart palpitations, cough and shortness of breath reported. No reports of GI problems such as nausea, vomiting, diarrhea, and constipation. Debra Williamson has no reports of blood in stools, dysuria and hematuria. No depression or anxiety reported. Debra Williamson denies pain today.   Review of Systems  Constitutional: Negative.   HENT: Negative.   Eyes: Negative.   Respiratory: Negative.   Cardiovascular: Positive for chest pain.  Gastrointestinal: Negative.   Endocrine: Negative.   Genitourinary: Negative.   Musculoskeletal: Negative.   Skin: Negative.   Allergic/Immunologic: Negative.   Neurological: Positive for dizziness and headaches.  Hematological: Negative.   Psychiatric/Behavioral: Negative.    Objective:   Physical Exam Vitals signs and nursing note reviewed.  Constitutional:      Appearance: Normal appearance. Debra Williamson is normal weight.  HENT:     Head: Normocephalic and atraumatic.     Right Ear: External ear normal.     Left Ear: External ear normal.     Nose: Nose normal.      Mouth/Throat:     Mouth: Mucous membranes are moist.     Pharynx: Oropharynx is clear.  Eyes:     Conjunctiva/sclera: Conjunctivae normal.  Neck:     Musculoskeletal: Normal range of motion and neck supple.  Cardiovascular:     Rate and Rhythm: Normal rate and regular rhythm.     Pulses: Normal pulses.     Heart sounds: Normal heart sounds.  Pulmonary:     Effort: Pulmonary effort is normal.     Breath sounds: Normal breath sounds.  Abdominal:     General: Abdomen is flat. Bowel sounds are normal.     Palpations: Abdomen is soft.  Musculoskeletal: Normal range of motion.  Skin:    General: Skin is warm and dry.     Capillary Refill: Capillary refill takes less than 2 seconds.  Neurological:     General: No focal deficit present.     Mental Status: Debra Williamson is alert and oriented to person, place, and time.  Psychiatric:        Mood and Affect: Mood normal.        Behavior: Behavior normal.        Thought Content: Thought content normal.        Judgment: Judgment normal.    Assessment & Plan:   1. Vaginal discharge Swab results are pending.  - POCT urinalysis dipstick - NuSwab Vaginitis Plus (VG+)  2. Dysuria  3. Urinary tract infection with hematuria, site unspecified We will initiate  Septra today.  - sulfamethoxazole-trimethoprim (BACTRIM DS,SEPTRA DS) 800-160 MG tablet; Take 1 tablet by mouth 2 (two) times daily.  Dispense: 14 tablet; Refill: 0  4. Abnormal urinalysis Results of urine culture and swab is pending.  - Urine Culture  5. Hb-SS disease without crisis [facility](HCC) Debra Williamson is doing well today. Debra Williamson will continue to take pain medications as prescribed; will continue to avoid extreme heat and cold; will continue to eat a healthy diet and drink at least 64 ounces of water daily; continue stool softener as needed; will avoid colds and flu; will continue to get plenty of sleep and rest; will continue to avoid high stressful situations and remain infection free; will  continue Folic Acid 1 mg daily to avoid sickle cell crisis.   6. Chronic pain syndrome  7. Chronic, continuous use of opioids  8. Insomnia, unspecified type - zolpidem (AMBIEN) 5 MG tablet; Take 1 tablet (5 mg total) by mouth at bedtime as needed. sleep  Dispense: 30 tablet; Refill: 0  9. Follow up Debra Williamson will keep previously scheduled follow up appointment.   Meds ordered this encounter  Medications  . zolpidem (AMBIEN) 5 MG tablet    Sig: Take 1 tablet (5 mg total) by mouth at bedtime as needed. sleep    Dispense:  30 tablet    Refill:  0  . sulfamethoxazole-trimethoprim (BACTRIM DS,SEPTRA DS) 800-160 MG tablet    Sig: Take 1 tablet by mouth 2 (two) times daily.    Dispense:  14 tablet    Refill:  0   Raliegh IpNatalie Alexandria Shiflett,  MSN, FNP-C Patient [facility] [address]  [place], [place] [id number] [phone]

## 2018-10-20 NOTE — Patient Instructions (Signed)
Urinary Tract Infection, Adult A urinary tract infection (UTI) is an infection of any part of the urinary tract. The urinary tract includes:  The kidneys.  The ureters.  The bladder.  The urethra. These organs make, store, and get rid of pee (urine) in the body. What are the causes? This is caused by germs (bacteria) in your genital area. These germs grow and cause swelling (inflammation) of your urinary tract. What increases the risk? You are more likely to develop this condition if:  You have a small, thin tube (catheter) to drain pee.  You cannot control when you pee or poop (incontinence).  You are female, and: ? You use these methods to prevent pregnancy: ? A medicine that kills sperm (spermicide). ? A device that blocks sperm (diaphragm). ? You have low levels of a female hormone (estrogen). ? You are pregnant.  You have genes that add to your risk.  You are sexually active.  You take antibiotic medicines.  You have trouble peeing because of: ? A prostate that is bigger than normal, if you are female. ? A blockage in the part of your body that drains pee from the bladder (urethra). ? A kidney stone. ? A nerve condition that affects your bladder (neurogenic bladder). ? Not getting enough to drink. ? Not peeing often enough.  You have other conditions, such as: ? Diabetes. ? A weak disease-fighting system (immune system). ? Sickle cell disease. ? Gout. ? Injury of the spine. What are the signs or symptoms? Symptoms of this condition include:  Needing to pee right away (urgently).  Peeing often.  Peeing small amounts often.  Pain or burning when peeing.  Blood in the pee.  Pee that smells bad or not like normal.  Trouble peeing.  Pee that is cloudy.  Fluid coming from the vagina, if you are female.  Pain in the belly or lower back. Other symptoms include:  Throwing up (vomiting).  No urge to eat.  Feeling mixed up (confused).  Being tired  and grouchy (irritable).  A fever.  Watery poop (diarrhea). How is this treated? This condition may be treated with:  Antibiotic medicine.  Other medicines.  Drinking enough water. Follow these instructions at home:  Medicines  Take over-the-counter and prescription medicines only as told by your doctor.  If you were prescribed an antibiotic medicine, take it as told by your doctor. Do not stop taking it even if you start to feel better. General instructions  Make sure you: ? Pee until your bladder is empty. ? Do not hold pee for a long time. ? Empty your bladder after sex. ? Wipe from front to back after pooping if you are a female. Use each tissue one time when you wipe.  Drink enough fluid to keep your pee pale yellow.  Keep all follow-up visits as told by your doctor. This is important. Contact a doctor if:  You do not get better after 1-2 days.  Your symptoms go away and then come back. Get help right away if:  You have very bad back pain.  You have very bad pain in your lower belly.  You have a fever.  You are sick to your stomach (nauseous).  You are throwing up. Summary  A urinary tract infection (UTI) is an infection of any part of the urinary tract.  This condition is caused by germs in your genital area.  There are many risk factors for a UTI. These include having a small, thin   tube to drain pee and not being able to control when you pee or poop.  Treatment includes antibiotic medicines for germs.  Drink enough fluid to keep your pee pale yellow. This information is not intended to replace advice given to you by your health care provider. Make sure you discuss any questions you have with your health care provider. Document Released: 03/25/2008 Document Revised: 04/16/2018 Document Reviewed: 04/16/2018 Elsevier Interactive Patient Education  2019 Elsevier Inc. Sulfamethoxazole; Trimethoprim, SMX-TMP tablets What is this medicine?  SULFAMETHOXAZOLE; TRIMETHOPRIM or SMX-TMP (suhl fuh meth OK suh zohl; trye METH oh prim) is a combination of a sulfonamide antibiotic and a second antibiotic, trimethoprim. It is used to treat or prevent certain kinds of bacterial infections. It will not work for colds, flu, or other viral infections. This medicine may be used for other purposes; ask your health care provider or pharmacist if you have questions. COMMON BRAND NAME(S): Bacter-Aid DS, Bactrim, Bactrim DS, Septra, Septra DS What should I tell my health care provider before I take this medicine? They need to know if you have any of these conditions: -anemia -asthma -being treated with anticonvulsants -if you frequently drink alcohol containing drinks -kidney disease -liver disease -low level of folic acid or glucose-6-phosphate dehydrogenase -poor nutrition or malabsorption -porphyria -severe allergies -thyroid disorder -an unusual or allergic reaction to sulfamethoxazole, trimethoprim, sulfa drugs, other medicines, foods, dyes, or preservatives -pregnant or trying to get pregnant -breast-feeding How should I use this medicine? Take this medicine by mouth with a full glass of water. Follow the directions on the prescription label. Take your medicine at regular intervals. Do not take it more often than directed. Do not skip doses or stop your medicine early. Talk to your pediatrician regarding the use of this medicine in children. Special care may be needed. This medicine has been used in children as young as 2 months of age. Overdosage: If you think you have taken too much of this medicine contact a poison control center or emergency room at once. NOTE: This medicine is only for you. Do not share this medicine with others. What if I miss a dose? If you miss a dose, take it as soon as you can. If it is almost time for your next dose, take only that dose. Do not take double or extra doses. What may interact with this medicine?  Do not take this medicine with any of the following medications: -aminobenzoate potassium -dofetilide -metronidazole This medicine may also interact with the following medications: -ACE inhibitors like benazepril, enalapril, lisinopril, and ramipril -birth control pills -cyclosporine -digoxin -diuretics -indomethacin -medicines for diabetes -methenamine -methotrexate -phenytoin -potassium supplements -pyrimethamine -sulfinpyrazone -tricyclic antidepressants -warfarin This list may not describe all possible interactions. Give your health care provider a list of all the medicines, herbs, non-prescription drugs, or dietary supplements you use. Also tell them if you smoke, drink alcohol, or use illegal drugs. Some items may interact with your medicine. What should I watch for while using this medicine? Tell your doctor or health care professional if your symptoms do not improve. Drink several glasses of water a day to reduce the risk of kidney problems. Do not treat diarrhea with over the counter products. Contact your doctor if you have diarrhea that lasts more than 2 days or if it is severe and watery. This medicine can make you more sensitive to the sun. Keep out of the sun. If you cannot avoid being in the sun, wear protective clothing and use a sunscreen. Do   not use sun lamps or tanning beds/booths. What side effects may I notice from receiving this medicine? Side effects that you should report to your doctor or health care professional as soon as possible: -allergic reactions like skin rash or hives, swelling of the face, lips, or tongue -breathing problems -fever or chills, sore throat -irregular heartbeat, chest pain -joint or muscle pain -pain or difficulty passing urine -red pinpoint spots on skin -redness, blistering, peeling or loosening of the skin, including inside the mouth -unusual bleeding or bruising -unusually weak or tired -yellowing of the eyes or skin Side  effects that usually do not require medical attention (report to your doctor or health care professional if they continue or are bothersome): -diarrhea -dizziness -headache -loss of appetite -nausea, vomiting -nervousness This list may not describe all possible side effects. Call your doctor for medical advice about side effects. You may report side effects to FDA at 1-800-FDA-1088. Where should I keep my medicine? Keep out of the reach of children. Store at room temperature between 20 to 25 degrees C (68 to 77 degrees F). Protect from light. Throw away any unused medicine after the expiration date. NOTE: This sheet is a summary. It may not cover all possible information. If you have questions about this medicine, talk to your doctor, pharmacist, or health care provider.  2019 Elsevier/Gold Standard (2013-05-14 14:38:26)  

## 2018-10-22 LAB — URINE CULTURE

## 2018-10-22 MED FILL — ZOLPIDEM TARTRATE 5 MG TAB: 5 | 30 days supply | Qty: 30 | Fill #0

## 2018-10-23 DIAGNOSIS — Z7689 Persons encountering health services in other specified circumstances: Secondary | ICD-10-CM | POA: Diagnosis not present

## 2018-10-23 DIAGNOSIS — D571 Sickle-cell disease without crisis: Secondary | ICD-10-CM | POA: Diagnosis not present

## 2018-10-23 LAB — NUSWAB VAGINITIS PLUS (VG+)
Atopobium vaginae: HIGH Score — AB
BVAB 2: HIGH Score — AB
Candida albicans, NAA: NEGATIVE
Candida glabrata, NAA: NEGATIVE
Chlamydia trachomatis, NAA: NEGATIVE
Megasphaera 1: HIGH Score — AB
Neisseria gonorrhoeae, NAA: NEGATIVE
Trich vag by NAA: POSITIVE — AB

## 2018-10-26 ENCOUNTER — Ambulatory Visit: Payer: Medicaid Other | Admitting: Family Medicine

## 2018-10-26 ENCOUNTER — Telehealth: Payer: Self-pay

## 2018-10-27 ENCOUNTER — Other Ambulatory Visit: Payer: Self-pay | Admitting: Family Medicine

## 2018-10-27 DIAGNOSIS — D571 Sickle-cell disease without crisis: Secondary | ICD-10-CM

## 2018-10-27 DIAGNOSIS — F1190 Opioid use, unspecified, uncomplicated: Secondary | ICD-10-CM

## 2018-10-27 DIAGNOSIS — G894 Chronic pain syndrome: Secondary | ICD-10-CM

## 2018-10-27 MED ORDER — OXYCONTIN 30 MG PO T12A: 1.0000 | EXTENDED_RELEASE_TABLET | Freq: Two times a day (BID) | ORAL | 0 refills | Status: DC

## 2018-10-27 MED ORDER — OXYCODONE HCL 10 MG PO TABS: 10.0000 mg | ORAL_TABLET | ORAL | 0 refills | Status: DC

## 2018-10-27 MED FILL — oxyCODONE HCL 10 MG TABS: 10 | 15 days supply | Qty: 90 | Fill #0

## 2018-10-28 ENCOUNTER — Ambulatory Visit: Payer: Medicaid Other | Admitting: Family Medicine

## 2018-10-28 NOTE — Telephone Encounter (Signed)
Patient notified

## 2018-10-30 MED FILL — lamoTRIgine 200 MG TABS: 200 | 30 days supply | Qty: 60 | Fill #1

## 2018-10-30 MED FILL — levETIRAcetam 1000 MG TABS: 1000 | 30 days supply | Qty: 60 | Fill #1

## 2018-10-30 MED FILL — PROMETHAZINE 25 MG TABLET: 25 | 10 days supply | Qty: 30 | Fill #2

## 2018-10-30 MED FILL — OxyCONTIN 30 MG T12A: 30 | 30 days supply | Qty: 60 | Fill #0

## 2018-11-02 MED FILL — JADENU 360 MG TABLET: 360 | 30 days supply | Qty: 60 | Fill #2

## 2018-11-03 MED FILL — LIDOCAINE-PRILOCAINE CREAM: 2.5-2.5 | 30 days supply | Qty: 30 | Fill #3

## 2018-11-09 ENCOUNTER — Ambulatory Visit (INDEPENDENT_AMBULATORY_CARE_PROVIDER_SITE_OTHER): Payer: Medicaid Other | Admitting: Family Medicine

## 2018-11-09 ENCOUNTER — Ambulatory Visit: Payer: Medicaid Other | Admitting: Family Medicine

## 2018-11-09 ENCOUNTER — Encounter: Payer: Self-pay | Admitting: Family Medicine

## 2018-11-09 VITALS — BP 112/66 | HR 84 | Temp 98.1°F | Ht 64.0 in | Wt 166.5 lb

## 2018-11-09 DIAGNOSIS — R8290 Unspecified abnormal findings in urine: Secondary | ICD-10-CM | POA: Diagnosis not present

## 2018-11-09 DIAGNOSIS — G894 Chronic pain syndrome: Secondary | ICD-10-CM | POA: Diagnosis not present

## 2018-11-09 DIAGNOSIS — D571 Sickle-cell disease without crisis: Secondary | ICD-10-CM

## 2018-11-09 DIAGNOSIS — F1190 Opioid use, unspecified, uncomplicated: Secondary | ICD-10-CM

## 2018-11-09 DIAGNOSIS — Z09 Encounter for follow-up examination after completed treatment for conditions other than malignant neoplasm: Secondary | ICD-10-CM

## 2018-11-09 DIAGNOSIS — R319 Hematuria, unspecified: Secondary | ICD-10-CM

## 2018-11-09 DIAGNOSIS — G4700 Insomnia, unspecified: Secondary | ICD-10-CM

## 2018-11-09 DIAGNOSIS — N898 Other specified noninflammatory disorders of vagina: Secondary | ICD-10-CM

## 2018-11-09 DIAGNOSIS — N390 Urinary tract infection, site not specified: Secondary | ICD-10-CM

## 2018-11-09 LAB — POCT URINALYSIS DIP (MANUAL ENTRY)
Glucose, UA: NEGATIVE mg/dL
Ketones, POC UA: NEGATIVE mg/dL
Nitrite, UA: NEGATIVE
Spec Grav, UA: 1.01 (ref 1.010–1.025)
Urobilinogen, UA: 0.2 E.U./dL
pH, UA: 5.5 (ref 5.0–8.0)

## 2018-11-09 MED ORDER — NITROFURANTOIN MONOHYD MACRO 100 MG PO CAPS: 100.0000 mg | ORAL_CAPSULE | Freq: Two times a day (BID) | ORAL | 0 refills | Status: AC

## 2018-11-09 MED FILL — NITROFURANTOIN MONO-MCR 100: 100 | 7 days supply | Qty: 14 | Fill #0

## 2018-11-09 NOTE — Progress Notes (Signed)
Sickle Cell Anemia Follow Up  Subjective:  Patient ID: Debra Williamson, female    DOB: 1981/08/28  Age: 38 y.o. MRN: [id number]  CC:  Chief Complaint  Patient presents with  . Follow-up    sickle cell   HPI Debra Williamson is a 38 year old female who presents for follow up of Sickle Cell Anemia. Since Hlast office visit,  is doing well with no complaints. She states that she has pain in her joints. She rates her pain today at 0/10. She has not has a /hospital visit for Sickle Cell Crisis since where 09/06/2016 where she was treated and discharged the same day. She is currently taking all medications as prescribed and staying well hydrated. She reports occasional dizziness and headaches. No recent incidents of seizures. She has occasional mild chest pain.   Past Medical History:  Diagnosis Date  . Anemia   . Blood transfusion without reported diagnosis   . Seizures (HCC)    one time incident, may have been r/t to a pain medication she was prescribed  . Sickle cell disease (HCC)   . Stroke [facility])     Past Surgical History:  Procedure Laterality Date  . CESAREAN SECTION     x4  . CHOLECYSTECTOMY    . PORTACATH PLACEMENT Right w-3  . reverse tubal ligation    . TEE WITHOUT CARDIOVERSION N/A 09/10/2016   Procedure: TRANSESOPHAGEAL ECHOCARDIOGRAM (TEE);  Surgeon: Thurmon Fair, MD;  Location: [facility] ENDOSCOPY;  Service: Cardiovascular;  Laterality: N/A;    Family History  Problem Relation Age of Onset  . HIV/AIDS Father     Social History   Socioeconomic History  . Marital status: Married    Spouse name: Not on file  . Number of children: 4  . Years of education: Not on file  . Highest education level: Not on file  Occupational History  . Occupation: Doesn't work  Engineer, production  . Financial resource strain: Not on file  . Food insecurity:    Worry: Not on file    Inability: Not on file  . Transportation needs:    Medical: Not on file    Non-medical: Not on file   Tobacco Use  . Smoking status: Never Smoker  . Smokeless tobacco: Never Used  Substance and Sexual Activity  . Alcohol use: No    Alcohol/week: 0.0 standard drinks    Comment: occasionally  . Drug use: No  . Sexual activity: Yes  Lifestyle  . Physical activity:    Days per week: Not on file    Minutes per session: Not on file  . Stress: Not on file  Relationships  . Social connections:    Talks on phone: Not on file    Gets together: Not on file    Attends religious service: Not on file    Active member of club or organization: Not on file    Attends meetings of clubs or organizations: Not on file    Relationship status: Not on file  . Intimate partner violence:    Fear of current or ex partner: Not on file    Emotionally abused: Not on file    Physically abused: Not on file    Forced sexual activity: Not on file  Other Topics Concern  . Not on file  Social History Narrative  . Not on file    Outpatient Medications Prior to Visit  Medication Sig Dispense Refill  . albuterol (PROVENTIL HFA;VENTOLIN HFA) 108 (90 Base) MCG/ACT inhaler  Inhale 2 puffs into the lungs every 4 (four) hours as needed for wheezing or shortness of breath (cough, shortness of breath or wheezing.). 1 Inhaler 1  . Ascorbic Acid (VITAMIN C) 1000 MG tablet Take 1,000 mg by mouth every evening.    Marland Kitchen aspirin EC 81 MG tablet Take 1 tablet by mouth every evening.     . Deferasirox (JADENU) 360 MG TABS Take 360 mg by mouth 2 (two) times daily.    . diclofenac sodium (VOLTAREN) 1 % GEL Apply 2 g topically 4 (four) times daily. 100 g 1  . folic acid (FOLVITE) 1 MG tablet Take 1 mg by mouth daily.     . hydrOXYzine (ATARAX/VISTARIL) 25 MG tablet Take 0.5-1 tablets (12.5-25 mg total) by mouth every 8 (eight) hours as needed for itching. 60 tablet 1  . hydrOXYzine (VISTARIL) 25 MG capsule TAKE 1 CAPSULE BY MOUTH EVERY 6 HOURS AS NEEDED 60 capsule 1  . lamoTRIgine (LAMICTAL) 200 MG tablet TAKE 1 TABLET (200 MG  TOTAL) BY MOUTH 2 TIMES DAILY. 180 tablet 3  . levETIRAcetam (KEPPRA) 1000 MG tablet TAKE 1 TABLET BY MOUTH 2 TIMES DAILY. 180 tablet 3  . lidocaine-prilocaine (EMLA) cream Apply 1 application topically as needed. 30 g 3  . meloxicam (MOBIC) 15 MG tablet Take 1 tablet (15 mg total) by mouth daily. 30 tablet 3  . omeprazole (PRILOSEC) 40 MG capsule Take 1 capsule (40 mg total) by mouth daily. 30 capsule 3  . Oxycodone HCl 10 MG TABS Take 1 tablet (10 mg total) by mouth every 4 (four) hours for 15 days. 90 tablet 0  . OXYCONTIN 30 MG 12 hr tablet Take 1 tablet (30 mg total) by mouth 2 (two) times daily. 60 tablet 0  . promethazine (PHENERGAN) 25 MG tablet Take 1 tablet (25 mg total) by mouth every 8 (eight) hours as needed for nausea or vomiting. 1 tablet every 8 hours prn 30 tablet 2  . zolpidem (AMBIEN) 5 MG tablet Take 1 tablet (5 mg total) by mouth at bedtime as needed. sleep 30 tablet 0  . sulfamethoxazole-trimethoprim (BACTRIM DS,SEPTRA DS) 800-160 MG tablet Take 1 tablet by mouth 2 (two) times daily. 14 tablet 0   No facility-administered medications prior to visit.     Allergies  Allergen Reactions  . Ibuprofen Hives  . Zofran [Ondansetron Hcl] Hives and Itching  . Demerol [Meperidine] Other (See Comments)    Pt has seizures  . Fentanyl And Related Nausea And Vomiting and Other (See Comments)    Very sedated  **Not allergic to the injection** JUST ALLERGIC TO PATCH  . Other Itching    Greek Yogurt  . Codeine Hives and Itching  . Hydrocodone Hives and Itching    ROS Review of Systems  Constitutional: Negative.   HENT: Negative.   Eyes: Negative.   Respiratory: Negative.   Cardiovascular: Positive for chest pain (occasional, mild).  Gastrointestinal: Negative.   Endocrine: Negative.   Genitourinary: Negative.   Musculoskeletal: Negative.   Skin: Negative.   Neurological: Negative.   Hematological: Negative.   Psychiatric/Behavioral: Negative.    Objective:     Physical Exam  Constitutional: She is oriented to person, place, and time. She appears well-developed and well-nourished.  HENT:  Head: Normocephalic and atraumatic.  Neck: Normal range of motion. Neck supple.  Cardiovascular: Normal rate and regular rhythm.  Pulmonary/Chest: Effort normal and breath sounds normal.  Abdominal: Soft.  Musculoskeletal: Normal range of motion.  Neurological: She is alert  and oriented to person, place, and time.  Skin: Skin is warm and dry.    BP 112/66 (BP Location: Left Arm, Patient Position: Sitting, Cuff Size: Small)   Pulse 84   Temp 98.1 F (36.7 C) (Oral)   Ht 5\' 4"  (1.626 m)   Wt 166 lb 8 oz (75.5 kg)   SpO2 96%   BMI 28.58 kg/m  Wt Readings from Last 3 Encounters:  11/09/18 166 lb 8 oz (75.5 kg)  10/20/18 156 lb (70.8 kg)  09/08/18 165 lb (74.8 kg)     There are no preventive care reminders to display for this patient.  There are no preventive care reminders to display for this patient.  Lab Results  Component Value Date   TSH 0.668 06/21/2016   Lab Results  Component Value Date   WBC 11.0 (H) 02/07/2017   HGB 7.7 (L) 02/07/2017   HCT 23.8 (L) 02/07/2017   MCV 90.8 02/07/2017   PLT 611 (H) 02/07/2017   Lab Results  Component Value Date   NA 139 02/07/2017   K 4.4 02/07/2017   CO2 22 02/07/2017   GLUCOSE 78 02/07/2017   BUN 9 02/07/2017   CREATININE 0.62 02/07/2017   BILITOT 2.0 (H) 02/07/2017   ALKPHOS 70 02/07/2017   AST 72 (H) 02/07/2017   ALT 56 (H) 02/07/2017   PROT 7.8 02/07/2017   ALBUMIN 4.6 02/07/2017   CALCIUM 9.5 02/07/2017   ANIONGAP 7 09/09/2016   Lab Results  Component Value Date   CHOL 128 09/07/2016   Lab Results  Component Value Date   HDL 26 (L) 09/07/2016   Lab Results  Component Value Date   LDLCALC 60 09/07/2016   Lab Results  Component Value Date   TRIG 211 (H) 09/07/2016   Lab Results  Component Value Date   CHOLHDL 4.9 09/07/2016   Lab Results  Component Value Date    HGBA1C <4.2 (L) 09/07/2016    Assessment & Plan:   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 She has mild occasional chest pain. She will continue to take pain medications as prescribed; will continue to avoid extreme heat and cold; will continue to eat a healthy diet and drink at least 64 ounces of water daily; continue stool softener as needed; will avoid colds and flu; will continue to get plenty of sleep and rest; will continue to avoid high stressful situations and remain infection free; will continue Folic Acid 1 mg daily to avoid sickle cell crisis.  - CBC with Differential - Comprehensive metabolic panel  2. Chronic pain syndrome  3. Chronic, continuous use of opioids  4. Insomnia, unspecified type Ambien is effective. She will continue as needed.   5. Urinary tract infection with hematuria, site unspecified We will initiate Macrobid today.  - nitrofurantoin, macrocrystal-monohydrate, (MACROBID) 100 MG capsule; Take 1 capsule (100 mg total) by mouth 2 (two) times daily for 7 days.  Dispense: 14 capsule; Refill: 0  6. Vaginal discharge  7. Abnormal urinalysis Results are pending. - Urine Culture  8. Follow up She will follow up in 2 months.  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 nitrofurantoin, macrocrystal-monohydrate, (MACROBID) 100 MG capsule    Sig: Take 1 capsule (100 mg total) by mouth 2 (two) times daily for 7 days.    Dispense:  14 capsule    Refill:  0    Follow-up: Return in about 2 months (around 01/08/2019).    Kallie Locks, FNP

## 2018-11-09 NOTE — Patient Instructions (Signed)
Nitrofurantoin tablets or capsules  What is this medicine?  NITROFURANTOIN (nye troe fyoor AN toyn) is an antibiotic. It is used to treat urinary tract infections.  This medicine may be used for other purposes; ask your health care provider or pharmacist if you have questions.  COMMON BRAND NAME(S): Macrobid, Macrodantin, Urotoin  What should I tell my health care provider before I take this medicine?  They need to know if you have any of these conditions:  -anemia  -diabetes  -glucose-6-phosphate dehydrogenase deficiency  -kidney disease  -liver disease  -lung disease  -other chronic illness  -an unusual or allergic reaction to nitrofurantoin, other antibiotics, other medicines, foods, dyes or preservatives  -pregnant or trying to get pregnant  -breast-feeding  How should I use this medicine?  Take this medicine by mouth with a glass of water. Follow the directions on the prescription label. Take this medicine with food or milk. Take your doses at regular intervals. Do not take your medicine more often than directed. Do not stop taking except on your doctor's advice.  Talk to your pediatrician regarding the use of this medicine in children. While this drug may be prescribed for selected conditions, precautions do apply.  Overdosage: If you think you have taken too much of this medicine contact a poison control center or emergency room at once.  NOTE: This medicine is only for you. Do not share this medicine with others.  What if I miss a dose?  If you miss a dose, take it as soon as you can. If it is almost time for your next dose, take only that dose. Do not take double or extra doses.  What may interact with this medicine?  -antacids containing magnesium trisilicate  -probenecid  -quinolone antibiotics like ciprofloxacin, lomefloxacin, norfloxacin and ofloxacin  -sulfinpyrazone  This list may not describe all possible interactions. Give your health care provider a list of all the medicines, herbs,  non-prescription drugs, or dietary supplements you use. Also tell them if you smoke, drink alcohol, or use illegal drugs. Some items may interact with your medicine.  What should I watch for while using this medicine?  Tell your doctor or health care professional if your symptoms do not improve or if you get new symptoms. Drink several glasses of water a day. If you are taking this medicine for a long time, visit your doctor for regular checks on your progress.  If you are diabetic, you may get a false positive result for sugar in your urine with certain brands of urine tests. Check with your doctor.  What side effects may I notice from receiving this medicine?  Side effects that you should report to your doctor or health care professional as soon as possible:  -allergic reactions like skin rash or hives, swelling of the face, lips, or tongue  -chest pain  -cough  -difficulty breathing  -dizziness, drowsiness  -fever or infection  -joint aches or pains  -pale or blue-tinted skin  -redness, blistering, peeling or loosening of the skin, including inside the mouth  -tingling, burning, pain, or numbness in hands or feet  -unusual bleeding or bruising  -unusually weak or tired  -yellowing of eyes or skin  Side effects that usually do not require medical attention (report to your doctor or health care professional if they continue or are bothersome):  -dark urine  -diarrhea  -headache  -loss of appetite  -nausea or vomiting  -temporary hair loss  This list may not describe all possible   side effects. Call your doctor for medical advice about side effects. You may report side effects to FDA at 1-800-FDA-1088.  Where should I keep my medicine?  Keep out of the reach of children.  Store at room temperature between 15 and 30 degrees C (59 and 86 degrees F). Protect from light. Throw away any unused medicine after the expiration date.  NOTE: This sheet is a summary. It may not cover all possible information. If you have  questions about this medicine, talk to your doctor, pharmacist, or health care provider.   2019 Elsevier/Gold Standard (2008-04-27 15:56:47)    Urinary Tract Infection, Adult  A urinary tract infection (UTI) is an infection of any part of the urinary tract. The urinary tract includes:  The kidneys.  The ureters.  The bladder.  The urethra.  These organs make, store, and get rid of pee (urine) in the body.  What are the causes?  This is caused by germs (bacteria) in your genital area. These germs grow and cause swelling (inflammation) of your urinary tract.  What increases the risk?  You are more likely to develop this condition if:  You have a small, thin tube (catheter) to drain pee.  You cannot control when you pee or poop (incontinence).  You are female, and:  You use these methods to prevent pregnancy:  A medicine that kills sperm (spermicide).  A device that blocks sperm (diaphragm).  You have low levels of a female hormone (estrogen).  You are pregnant.  You have genes that add to your risk.  You are sexually active.  You take antibiotic medicines.  You have trouble peeing because of:  A prostate that is bigger than normal, if you are female.  A blockage in the part of your body that drains pee from the bladder (urethra).  A kidney stone.  A nerve condition that affects your bladder (neurogenic bladder).  Not getting enough to drink.  Not peeing often enough.  You have other conditions, such as:  Diabetes.  A weak disease-fighting system (immune system).  Sickle cell disease.  Gout.  Injury of the spine.  What are the signs or symptoms?  Symptoms of this condition include:  Needing to pee right away (urgently).  Peeing often.  Peeing small amounts often.  Pain or burning when peeing.  Blood in the pee.  Pee that smells bad or not like normal.  Trouble peeing.  Pee that is cloudy.  Fluid coming from the vagina, if you are female.  Pain in the belly or lower back.  Other symptoms include:  Throwing up  (vomiting).  No urge to eat.  Feeling mixed up (confused).  Being tired and grouchy (irritable).  A fever.  Watery poop (diarrhea).  How is this treated?  This condition may be treated with:  Antibiotic medicine.  Other medicines.  Drinking enough water.  Follow these instructions at home:    Medicines  Take over-the-counter and prescription medicines only as told by your doctor.  If you were prescribed an antibiotic medicine, take it as told by your doctor. Do not stop taking it even if you start to feel better.  General instructions  Make sure you:  Pee until your bladder is empty.   Do not hold pee for a long time.  Empty your bladder after sex.  Wipe from front to back after pooping if you are a female. Use each tissue one time when you wipe.  Drink enough fluid to keep your pee   pale yellow.  Keep all follow-up visits as told by your doctor. This is important.  Contact a doctor if:  You do not get better after 1-2 days.  Your symptoms go away and then come back.  Get help right away if:  You have very bad back pain.  You have very bad pain in your lower belly.  You have a fever.  You are sick to your stomach (nauseous).  You are throwing up.  Summary  A urinary tract infection (UTI) is an infection of any part of the urinary tract.  This condition is caused by germs in your genital area.  There are many risk factors for a UTI. These include having a small, thin tube to drain pee and not being able to control when you pee or poop.  Treatment includes antibiotic medicines for germs.  Drink enough fluid to keep your pee pale yellow.  This information is not intended to replace advice given to you by your health care provider. Make sure you discuss any questions you have with your health care provider.  Document Released: 03/25/2008 Document Revised: 04/16/2018 Document Reviewed: 04/16/2018  Elsevier Interactive Patient Education  2019 Elsevier [employer]

## 2018-11-10 DIAGNOSIS — H52223 Regular astigmatism, bilateral: Secondary | ICD-10-CM | POA: Diagnosis not present

## 2018-11-10 LAB — CBC WITH DIFFERENTIAL/PLATELET
Basophils Absolute: 0.1 10*3/uL (ref 0.0–0.2)
Basos: 1 %
EOS (ABSOLUTE): 0.2 10*3/uL (ref 0.0–0.4)
Eos: 2 %
Hematocrit: 22 % — ABNORMAL LOW (ref 34.0–46.6)
Hemoglobin: 7.4 g/dL — ABNORMAL LOW (ref 11.1–15.9)
Immature Grans (Abs): 0 10*3/uL (ref 0.0–0.1)
Immature Granulocytes: 0 %
Lymphocytes Absolute: 2.7 10*3/uL (ref 0.7–3.1)
Lymphs: 35 %
MCH: 30.2 pg (ref 26.6–33.0)
MCHC: 33.6 g/dL (ref 31.5–35.7)
MCV: 90 fL (ref 79–97)
Monocytes Absolute: 0.8 10*3/uL (ref 0.1–0.9)
Monocytes: 11 %
NRBC: 3 % — ABNORMAL HIGH (ref 0–0)
Neutrophils Absolute: 3.9 10*3/uL (ref 1.4–7.0)
Neutrophils: 51 %
Platelets: 366 10*3/uL (ref 150–450)
RBC: 2.45 x10E6/uL — CL (ref 3.77–5.28)
RDW: 19.5 % — ABNORMAL HIGH (ref 11.7–15.4)
WBC: 7.7 10*3/uL (ref 3.4–10.8)

## 2018-11-10 LAB — COMPREHENSIVE METABOLIC PANEL
ALT: 30 IU/L (ref 0–32)
AST: 48 IU/L — ABNORMAL HIGH (ref 0–40)
Albumin/Globulin Ratio: 1.4 (ref 1.2–2.2)
Albumin: 4.3 g/dL (ref 3.8–4.8)
Alkaline Phosphatase: 70 IU/L (ref 39–117)
BUN/Creatinine Ratio: 11 (ref 9–23)
BUN: 7 mg/dL (ref 6–20)
Bilirubin Total: 2.6 mg/dL — ABNORMAL HIGH (ref 0.0–1.2)
CO2: 19 mmol/L — ABNORMAL LOW (ref 20–29)
Calcium: 9.3 mg/dL (ref 8.7–10.2)
Chloride: 101 mmol/L (ref 96–106)
Creatinine, Ser: 0.63 mg/dL (ref 0.57–1.00)
GFR calc Af Amer: 131 mL/min/{1.73_m2} (ref >59)
GFR calc non Af Amer: 113 mL/min/{1.73_m2} (ref >59)
Globulin, Total: 3 g/dL (ref 1.5–4.5)
Glucose: 85 mg/dL (ref 65–99)
Potassium: 3.6 mmol/L (ref 3.5–5.2)
Sodium: 138 mmol/L (ref 134–144)
Total Protein: 7.3 g/dL (ref 6.0–8.5)

## 2018-11-11 LAB — URINE CULTURE

## 2018-11-12 DIAGNOSIS — D571 Sickle-cell disease without crisis: Secondary | ICD-10-CM | POA: Diagnosis not present

## 2018-11-12 DIAGNOSIS — Z7689 Persons encountering health services in other specified circumstances: Secondary | ICD-10-CM | POA: Diagnosis not present

## 2018-11-13 DIAGNOSIS — D571 Sickle-cell disease without crisis: Secondary | ICD-10-CM | POA: Diagnosis not present

## 2018-11-13 DIAGNOSIS — Z7689 Persons encountering health services in other specified circumstances: Secondary | ICD-10-CM | POA: Diagnosis not present

## 2018-11-16 DIAGNOSIS — Z7689 Persons encountering health services in other specified circumstances: Secondary | ICD-10-CM | POA: Diagnosis not present

## 2018-11-16 DIAGNOSIS — D571 Sickle-cell disease without crisis: Secondary | ICD-10-CM | POA: Diagnosis not present

## 2018-11-16 MED FILL — VIT D2 1.25 MG (50,000 UNIT: 1.25 MG | 28 days supply | Qty: 4 | Fill #0

## 2018-11-24 ENCOUNTER — Non-Acute Institutional Stay (HOSPITAL_COMMUNITY)
Admission: AD | Admit: 2018-11-24 | Discharge: 2018-11-24 | Disposition: A | Discharge status: Home / Self Care | Payer: Medicaid Other | Source: Ambulatory Visit | Attending: Internal Medicine | Admitting: Internal Medicine

## 2018-11-24 ENCOUNTER — Ambulatory Visit (INDEPENDENT_AMBULATORY_CARE_PROVIDER_SITE_OTHER): Payer: Medicaid Other | Admitting: Family Medicine

## 2018-11-24 ENCOUNTER — Telehealth (HOSPITAL_COMMUNITY): Payer: Self-pay | Admitting: General Practice

## 2018-11-24 ENCOUNTER — Encounter: Payer: Self-pay | Admitting: Family Medicine

## 2018-11-24 ENCOUNTER — Ambulatory Visit (HOSPITAL_COMMUNITY)
Admission: RE | Admit: 2018-11-24 | Discharge: 2018-11-24 | Disposition: A | Discharge status: Home / Self Care | Payer: Medicaid Other | Source: Ambulatory Visit | Attending: Family Medicine | Admitting: Family Medicine

## 2018-11-24 VITALS — BP 118/64 | HR 90 | Temp 98.1°F | Ht 64.0 in | Wt 165.8 lb

## 2018-11-24 DIAGNOSIS — Z885 Allergy status to narcotic agent status: Secondary | ICD-10-CM | POA: Insufficient documentation

## 2018-11-24 DIAGNOSIS — G894 Chronic pain syndrome: Secondary | ICD-10-CM | POA: Diagnosis not present

## 2018-11-24 DIAGNOSIS — R079 Chest pain, unspecified: Secondary | ICD-10-CM | POA: Diagnosis not present

## 2018-11-24 DIAGNOSIS — D5700 Hb-SS disease with crisis, unspecified: Secondary | ICD-10-CM | POA: Diagnosis not present

## 2018-11-24 DIAGNOSIS — Z09 Encounter for follow-up examination after completed treatment for conditions other than malignant neoplasm: Secondary | ICD-10-CM

## 2018-11-24 DIAGNOSIS — Z79891 Long term (current) use of opiate analgesic: Secondary | ICD-10-CM | POA: Insufficient documentation

## 2018-11-24 DIAGNOSIS — Z7982 Long term (current) use of aspirin: Secondary | ICD-10-CM | POA: Insufficient documentation

## 2018-11-24 DIAGNOSIS — M2550 Pain in unspecified joint: Secondary | ICD-10-CM | POA: Diagnosis not present

## 2018-11-24 DIAGNOSIS — G40909 Epilepsy, unspecified, not intractable, without status epilepticus: Secondary | ICD-10-CM | POA: Insufficient documentation

## 2018-11-24 DIAGNOSIS — I69354 Hemiplegia and hemiparesis following cerebral infarction affecting left non-dominant side: Secondary | ICD-10-CM | POA: Diagnosis not present

## 2018-11-24 DIAGNOSIS — Z888 Allergy status to other drugs, medicaments and biological substances status: Secondary | ICD-10-CM | POA: Diagnosis not present

## 2018-11-24 DIAGNOSIS — Z886 Allergy status to analgesic agent status: Secondary | ICD-10-CM | POA: Diagnosis not present

## 2018-11-24 DIAGNOSIS — Z79899 Other long term (current) drug therapy: Secondary | ICD-10-CM | POA: Diagnosis not present

## 2018-11-24 DIAGNOSIS — F1190 Opioid use, unspecified, uncomplicated: Secondary | ICD-10-CM | POA: Diagnosis not present

## 2018-11-24 DIAGNOSIS — Z791 Long term (current) use of non-steroidal anti-inflammatories (NSAID): Secondary | ICD-10-CM | POA: Diagnosis not present

## 2018-11-24 DIAGNOSIS — D571 Sickle-cell disease without crisis: Secondary | ICD-10-CM | POA: Diagnosis not present

## 2018-11-24 LAB — CBC WITH DIFFERENTIAL/PLATELET
Abs Immature Granulocytes: 0.09 10*3/uL — ABNORMAL HIGH (ref 0.00–0.07)
Basophils Absolute: 0.1 10*3/uL (ref 0.0–0.1)
Basophils Relative: 1 %
Eosinophils Absolute: 0.2 10*3/uL (ref 0.0–0.5)
Eosinophils Relative: 2 %
HCT: 23.8 % — ABNORMAL LOW (ref 36.0–46.0)
Hemoglobin: 7.3 g/dL — ABNORMAL LOW (ref 12.0–15.0)
Immature Granulocytes: 1 %
Lymphocytes Relative: 23 %
Lymphs Abs: 2.7 10*3/uL (ref 0.7–4.0)
MCH: 28.4 pg (ref 26.0–34.0)
MCHC: 30.7 g/dL (ref 30.0–36.0)
MCV: 92.6 fL (ref 80.0–100.0)
Monocytes Absolute: 1.8 10*3/uL — ABNORMAL HIGH (ref 0.1–1.0)
Monocytes Relative: 15 %
Neutro Abs: 7 10*3/uL (ref 1.7–7.7)
Neutrophils Relative %: 58 %
Platelets: 361 10*3/uL (ref 150–400)
RBC: 2.57 MIL/uL — ABNORMAL LOW (ref 3.87–5.11)
RDW: 23.1 % — ABNORMAL HIGH (ref 11.5–15.5)
WBC: 11.9 10*3/uL — ABNORMAL HIGH (ref 4.0–10.5)
nRBC: 2.4 % — ABNORMAL HIGH (ref 0.0–0.2)

## 2018-11-24 LAB — COMPREHENSIVE METABOLIC PANEL
ALT: 22 U/L (ref 0–44)
AST: 39 U/L (ref 15–41)
Albumin: 4.3 g/dL (ref 3.5–5.0)
Alkaline Phosphatase: 55 U/L (ref 38–126)
Anion gap: 8 (ref 5–15)
BUN: 7 mg/dL (ref 6–20)
CO2: 26 mmol/L (ref 22–32)
Calcium: 8.4 mg/dL — ABNORMAL LOW (ref 8.9–10.3)
Chloride: 103 mmol/L (ref 98–111)
Creatinine, Ser: 0.62 mg/dL (ref 0.44–1.00)
GFR calc Af Amer: >60 mL/min (ref >60)
GFR calc non Af Amer: >60 mL/min (ref >60)
Glucose, Bld: 102 mg/dL — ABNORMAL HIGH (ref 70–99)
Potassium: 2.9 mmol/L — ABNORMAL LOW (ref 3.5–5.1)
Sodium: 137 mmol/L (ref 135–145)
Total Bilirubin: 3.3 mg/dL — ABNORMAL HIGH (ref 0.3–1.2)
Total Protein: 7.8 g/dL (ref 6.5–8.1)

## 2018-11-24 LAB — RETICULOCYTES
Immature Retic Fract: 33 % — ABNORMAL HIGH (ref 2.3–15.9)
RBC.: 2.57 MIL/uL — ABNORMAL LOW (ref 3.87–5.11)
Retic Count, Absolute: 432.8 10*3/uL — ABNORMAL HIGH (ref 19.0–186.0)
Retic Ct Pct: 16.8 % — ABNORMAL HIGH (ref 0.4–3.1)

## 2018-11-24 MED ORDER — PROMETHAZINE HCL 25 MG/ML IJ SOLN
12.5000 mg | Freq: Four times a day (QID) | INTRAMUSCULAR | Status: DC | PRN
  Administered 2018-11-24: 12.5 mg via INTRAVENOUS
  Filled 2018-11-24: qty 1

## 2018-11-24 MED ORDER — DEXTROSE-NACL 5-0.45 % IV SOLN
INTRAVENOUS | Status: DC
  Administered 2018-11-24: 15:00:00 via INTRAVENOUS

## 2018-11-24 MED ORDER — HYDROMORPHONE HCL 1 MG/ML IJ SOLN
0.5000 mg | Freq: Once | INTRAMUSCULAR | Status: AC
  Administered 2018-11-24: 0.5 mg via INTRAVENOUS
  Filled 2018-11-24: qty 1

## 2018-11-24 MED ORDER — OXYCODONE HCL 5 MG PO TABS
10.0000 mg | ORAL_TABLET | Freq: Once | ORAL | Status: AC
  Administered 2018-11-24: 10 mg via ORAL
  Filled 2018-11-24: qty 2

## 2018-11-24 MED ORDER — SODIUM CHLORIDE 0.9 % IV SOLN
25.0000 mg | INTRAVENOUS | Status: DC | PRN
  Administered 2018-11-24: 25 mg via INTRAVENOUS
  Filled 2018-11-24 (×3): qty 0.5

## 2018-11-24 MED ORDER — DIPHENHYDRAMINE HCL 25 MG PO CAPS
25.0000 mg | ORAL_CAPSULE | Freq: Once | ORAL | Status: AC
  Administered 2018-11-24: 25 mg via ORAL
  Filled 2018-11-24: qty 1

## 2018-11-24 MED ORDER — HEPARIN SOD (PORK) LOCK FLUSH 100 UNIT/ML IV SOLN
500.0000 [IU] | INTRAVENOUS | Status: DC | PRN
  Administered 2018-11-24: 500 [IU]
  Filled 2018-11-24: qty 5

## 2018-11-24 MED ORDER — HEPARIN SOD (PORK) LOCK FLUSH 100 UNIT/ML IV SOLN: 250.0000 [IU] | INTRAVENOUS | Status: DC | PRN

## 2018-11-24 MED ORDER — HYDROMORPHONE HCL 1 MG/ML IJ SOLN
1.0000 mg | Freq: Once | INTRAMUSCULAR | Status: AC
  Administered 2018-11-24: 1 mg via INTRAVENOUS
  Filled 2018-11-24: qty 1

## 2018-11-24 NOTE — H&P (Addendum)
[facility] History and Physical   Date: 11/24/2018  Patient name: Debra Williamson Medical record number: [id number] Date of birth: 1981-08-21 Age: 38 y.o. Gender: female PCP: Kallie Locks, FNP  Attending physician: Quentin Angst, MD  Chief Complaint: Generalized pain  History of Present Illness: Debra Williamson, a 38 year old female with a medical history significant for sickle cell anemia, hemoglobin SS, seizure disorder on Keppra, history of TIA, and left hemiparesis following CVA presents complaining of joint pain that is consistent with previous sickle cell pain crisis.  Patient was evaluated in primary care and was complaining of increased pain this a.m. that is unrelieved by home opiate medication regimen.  Patient states the pain is been increasing over the past 3-4 days.  She has not identified any palliative or provocative factors concerning current sickle cell crisis.  Patient is a very poor historian and is not very forthcoming with past medical history.  She denies headache, shortness of breath, paresthesias, nausea, vomiting, or diarrhea.  Patient endorses mid chest pain.  She underwent a chest x-ray this a.m. that showed no acute cardiopulmonary process.  Meds: Medications Prior to Admission  Medication Sig Dispense Refill Last Dose  . albuterol (PROVENTIL HFA;VENTOLIN HFA) 108 (90 Base) MCG/ACT inhaler Inhale 2 puffs into the lungs every 4 (four) hours as needed for wheezing or shortness of breath (cough, shortness of breath or wheezing.). 1 Inhaler 1 Taking  . Ascorbic Acid (VITAMIN C) 1000 MG tablet Take 1,000 mg by mouth every evening.   Taking  . aspirin EC 81 MG tablet Take 1 tablet by mouth every evening.    Taking  . Deferasirox (JADENU) 360 MG TABS Take 360 mg by mouth 2 (two) times daily.   Taking  . diclofenac sodium (VOLTAREN) 1 % GEL Apply 2 g topically 4 (four) times daily. 100 g 1 Taking  . folic acid (FOLVITE) 1 MG tablet Take 1 mg by  mouth daily.    Taking  . hydrOXYzine (ATARAX/VISTARIL) 25 MG tablet Take 0.5-1 tablets (12.5-25 mg total) by mouth every 8 (eight) hours as needed for itching. 60 tablet 1 Taking  . hydrOXYzine (VISTARIL) 25 MG capsule TAKE 1 CAPSULE BY MOUTH EVERY 6 HOURS AS NEEDED 60 capsule 1 Taking  . lamoTRIgine (LAMICTAL) 200 MG tablet TAKE 1 TABLET (200 MG TOTAL) BY MOUTH 2 TIMES DAILY. 180 tablet 3 Taking  . levETIRAcetam (KEPPRA) 1000 MG tablet TAKE 1 TABLET BY MOUTH 2 TIMES DAILY. 180 tablet 3 Taking  . lidocaine-prilocaine (EMLA) cream Apply 1 application topically as needed. 30 g 3 Taking  . meloxicam (MOBIC) 15 MG tablet Take 1 tablet (15 mg total) by mouth daily. 30 tablet 3 Taking  . omeprazole (PRILOSEC) 40 MG capsule Take 1 capsule (40 mg total) by mouth daily. 30 capsule 3 Taking  . OXYCONTIN 30 MG 12 hr tablet Take 1 tablet (30 mg total) by mouth 2 (two) times daily. 60 tablet 0 Taking  . promethazine (PHENERGAN) 25 MG tablet Take 1 tablet (25 mg total) by mouth every 8 (eight) hours as needed for nausea or vomiting. 1 tablet every 8 hours prn 30 tablet 2 Taking  . zolpidem (AMBIEN) 5 MG tablet Take 1 tablet (5 mg total) by mouth at bedtime as needed. sleep 30 tablet 0 Taking    Allergies: Ibuprofen; Zofran [ondansetron hcl]; Demerol [meperidine]; Fentanyl and related; Other; Codeine; and Hydrocodone Past Medical History:  Diagnosis Date  . Anemia   . Blood transfusion  without reported diagnosis   . Seizures (HCC)    one time incident, may have been r/t to a pain medication she was prescribed  . Sickle cell disease (HCC)   . Stroke [facility])    Past Surgical History:  Procedure Laterality Date  . CESAREAN SECTION     x4  . CHOLECYSTECTOMY    . PORTACATH PLACEMENT Right w-3  . reverse tubal ligation    . TEE WITHOUT CARDIOVERSION N/A 09/10/2016   Procedure: TRANSESOPHAGEAL ECHOCARDIOGRAM (TEE);  Surgeon: Thurmon Fair, MD;  Location: [facility] ENDOSCOPY;  Service: Cardiovascular;  Laterality:  N/A;   Family History  Problem Relation Age of Onset  . HIV/AIDS Father    Social History   Socioeconomic History  . Marital status: Married    Spouse name: Not on file  . Number of children: 4  . Years of education: Not on file  . Highest education level: Not on file  Occupational History  . Occupation: Doesn't work  Engineer, production  . Financial resource strain: Not on file  . Food insecurity:    Worry: Not on file    Inability: Not on file  . Transportation needs:    Medical: Not on file    Non-medical: Not on file  Tobacco Use  . Smoking status: Never Smoker  . Smokeless tobacco: Never Used  Substance and Sexual Activity  . Alcohol use: No    Alcohol/week: 0.0 standard drinks    Comment: occasionally  . Drug use: No  . Sexual activity: Yes  Lifestyle  . Physical activity:    Days per week: Not on file    Minutes per session: Not on file  . Stress: Not on file  Relationships  . Social connections:    Talks on phone: Not on file    Gets together: Not on file    Attends religious service: Not on file    Active member of club or organization: Not on file    Attends meetings of clubs or organizations: Not on file    Relationship status: Not on file  . Intimate partner violence:    Fear of current or ex partner: Not on file    Emotionally abused: Not on file    Physically abused: Not on file    Forced sexual activity: Not on file  Other Topics Concern  . Not on file  Social History Narrative  . Not on file  Review of Systems  Constitutional: Negative for chills and fever.  HENT: Negative.   Eyes: Negative.   Respiratory: Negative.   Cardiovascular: Positive for chest pain. Negative for palpitations.  Gastrointestinal: Negative.   Musculoskeletal: Positive for joint pain.  Skin: Negative.   Neurological: Negative.   Endo/Heme/Allergies: Negative.   Psychiatric/Behavioral: Negative.     Physical Exam: Blood pressure 113/61, pulse 95, temperature 98.5 F  (36.9 C), temperature source Oral, SpO2 97 %. Physical Exam Constitutional:      Appearance: Normal appearance.  HENT:     Right Ear: Tympanic membrane normal.     Left Ear: Tympanic membrane normal.     Mouth/Throat:     Mouth: Mucous membranes are dry.  Neck:     Musculoskeletal: Normal range of motion.  Cardiovascular:     Rate and Rhythm: Normal rate and regular rhythm.     Pulses: Normal pulses.     Heart sounds: Normal heart sounds.  Pulmonary:     Effort: Pulmonary effort is normal.     Breath sounds: Normal  breath sounds.  Abdominal:     General: Abdomen is flat.     Palpations: Abdomen is soft.  Skin:    General: Skin is warm.  Neurological:     Mental Status: She is alert.     Motor: Weakness (left sided) present.     Gait: Gait abnormal.      Lab results: Results for orders placed or performed during the hospital encounter of 11/24/18 (from the past 24 hour(s))  CBC WITH DIFFERENTIAL     Status: Abnormal (Preliminary result)   Collection Time: 11/24/18  2:33 PM  Result Value Ref Range   WBC 11.9 (H) 4.0 - 10.5 K/uL   RBC 2.57 (L) 3.87 - 5.11 MIL/uL   Hemoglobin 7.3 (L) 12.0 - 15.0 g/dL   HCT 16.123.8 (L) 09.636.0 - 04.546.0 %   MCV 92.6 80.0 - 100.0 fL   MCH 28.4 26.0 - 34.0 pg   MCHC 30.7 30.0 - 36.0 g/dL   RDW 40.923.1 (H) 81.111.5 - 91.415.5 %   Platelets 361 150 - 400 K/uL   nRBC 2.4 (H) 0.0 - 0.2 %   Neutrophils Relative % PENDING %   Neutro Abs PENDING 1.7 - 7.7 K/uL   Band Neutrophils PENDING %   Lymphocytes Relative PENDING %   Lymphs Abs PENDING 0.7 - 4.0 K/uL   Monocytes Relative PENDING %   Monocytes Absolute PENDING 0.1 - 1.0 K/uL   Eosinophils Relative PENDING %   Eosinophils Absolute PENDING 0.0 - 0.5 K/uL   Basophils Relative PENDING %   Basophils Absolute PENDING 0.0 - 0.1 K/uL   WBC Morphology PENDING    RBC Morphology PENDING    Smear Review PENDING    Other PENDING %   nRBC PENDING 0 /100 WBC   Metamyelocytes Relative PENDING %   Myelocytes  PENDING %   Promyelocytes Relative PENDING %   Blasts PENDING %  Reticulocytes     Status: Abnormal   Collection Time: 11/24/18  2:33 PM  Result Value Ref Range   Retic Ct Pct 16.8 (H) 0.4 - 3.1 %   RBC. 2.57 (L) 3.87 - 5.11 MIL/uL   Retic Count, Absolute 432.8 (H) 19.0 - 186.0 K/uL   Immature Retic Fract 33.0 (H) 2.3 - 15.9 %    Imaging results:  Dg Chest 2 View  Result Date: 11/24/2018 CLINICAL DATA:  Sickle cell disease EXAM: CHEST - 2 VIEW COMPARISON:  July 22, 2017 FINDINGS: Port-A-Cath tip is in the superior vena cava. No pneumothorax. There is no appreciable edema or consolidation. Heart is upper normal in size with pulmonary vascularity normal. No adenopathy. No bone lesions appreciable. IMPRESSION: Heart upper normal in size. Pulmonary vascularity normal. No adenopathy. No edema or consolidation. Port-A-Cath tip in superior vena cava. Electronically Signed   By: Bretta BangWilliam  Woodruff III M.D.   On: 11/24/2018 13:45     Assessment & Plan:  Patient will be admitted to the day infusion center for extended observation  Start IV D5.45 for cellular rehydration at 125/hr  Dilaudid 0.5 mg times one  Promethazine 12.5 mg times one  Benadryl 25 mg by mouth times one  Patient will be re-evaluated for pain intensity in the context of function and relationship to baseline as care progresses.  If no significant pain relief, will transfer patient to inpatient services for a higher level of care.   Review CMP,  LDH and CBC w/differential   Nolon NationsLachina Moore Yovany Clock  APRN, MSN, FNP-C [facility] [address] [place], [place] [address] [phone]  11/24/2018, 3:17 PM

## 2018-11-24 NOTE — Discharge Instructions (Signed)
Resume all of your home medications Increase fluid intake to 64 ounces per day Your laboratory values are consistent with baseline. I will share all results with primary care   The patient was given clear instructions to go to ER or return to medical center if symptoms do not improve, worsen or new problems develop. The patient verbalized understanding.   Sickle Cell Anemia, Adult Sickle cell anemia is a condition where your red blood cells are shaped like sickles. Red blood cells carry oxygen through the body. Sickle-shaped cells do not live as long as normal red blood cells. They also clump together and block blood from flowing through the blood vessels. This prevents the body from getting enough oxygen. Sickle cell anemia causes organ damage and pain. It also increases the risk of infection. Follow these instructions at home: Medicines  Take over-the-counter and prescription medicines only as told by your doctor.  If you were prescribed an antibiotic medicine, take it as told by your doctor. Do not stop taking the antibiotic even if you start to feel better.  If you develop a fever, do not take medicines to lower the fever right away. Tell your doctor about the fever. Managing pain, stiffness, and swelling  Try these methods to help with pain: ? Use a heating pad. ? Take a warm bath. ? Distract yourself, such as by watching TV. Eating and drinking  Drink enough fluid to keep your pee (urine) clear or pale yellow. Drink more in hot weather and during exercise.  Limit or avoid alcohol.  Eat a healthy diet. Eat plenty of fruits, vegetables, whole grains, and lean protein.  Take vitamins and supplements as told by your doctor. Traveling  When traveling, keep these with you: ? Your medical information. ? The names of your doctors. ? Your medicines.  If you need to take an airplane, talk to your doctor first. Activity  Rest often.  Avoid exercises that make your heart beat  much faster, such as jogging. General instructions  Do not use products that have nicotine or tobacco, such as cigarettes and e-cigarettes. If you need help quitting, ask your doctor.  Consider wearing a medical alert bracelet.  Avoid being in high places (high altitudes), such as mountains.  Avoid very hot or cold temperatures.  Avoid places where the temperature changes a lot.  Keep all follow-up visits as told by your doctor. This is important. Contact a doctor if:  A joint hurts.  Your feet or hands hurt or swell.  You feel tired (fatigued). Get help right away if:  You have symptoms of infection. These include: ? Fever. ? Chills. ? Being very tired. ? Irritability. ? Poor eating. ? Throwing up (vomiting).  You feel dizzy or faint.  You have new stomach pain, especially on the left side.  You have a an erection (priapism) that lasts more than 4 hours.  You have numbness in your arms or legs.  You have a hard time moving your arms or legs.  You have trouble talking.  You have pain that does not go away when you take medicine.  You are short of breath.  You are breathing fast.  You have a long-term cough.  You have pain in your chest.  You have a bad headache.  You have a stiff neck.  Your stomach looks bloated even though you did not eat much.  Your skin is pale.  You suddenly cannot see well. Summary  Sickle cell anemia is a condition  where your red blood cells are shaped like sickles.  Follow your doctor's advice on ways to manage pain, food to eat, activities to do, and steps to take for safe travel.  Get medical help right away if you have any signs of infection, such as a fever. This information is not intended to replace advice given to you by your health care provider. Make sure you discuss any questions you have with your health care provider. Document Released: 07/28/2013 Document Revised: 11/12/2016 Document Reviewed:  11/12/2016 Elsevier Interactive Patient Education  2019 [employer]

## 2018-11-24 NOTE — Telephone Encounter (Signed)
Patient called, complained of pain in the joints that she rated as 6/10. Denied  fever, diarrhea, abdominal pain and  nausea/vomitting. Endorsed some chest pain and some shortness of breath. However when she spoke with the provider, she denied shortness of breath. Admitted to having means of transportation without driving self after treatment. Last took Oxycodone 10 mg and OxyContin 30 mg last night. Provider notified; per provider, patient can come in for treatment. Patient notified, verbalized understanding.

## 2018-11-24 NOTE — Progress Notes (Signed)
Patient admitted to the day hospital for treatment of sickle cell pain crisis. Patient reported generalized pain rated 7/10. Patient given IV Dilaudid, IV Phenergan, IV/ PO Benadryl, PO Oxycodone and hydrated with IV fluids. At discharge patient reported pain at 4/10. Discharge instructions given to patient. Patient alert, oriented and ambulatory at discharge.

## 2018-11-24 NOTE — Progress Notes (Signed)
[facility]  Sick Cell Anemia--Sick Visit  Subjective:  Patient ID: Debra Williamson, female    DOB: [birth year]  Age: 38 y.o. MRN: [id number]  CC:  Chief Complaint  Patient presents with  . Headache  . Shortness of Breath   HPI Debra Williamson is a 38 year old female who presents for Sick Visit and assessment of Sickle Cell Anemia.  Past Medical History:  Diagnosis Date  . Anemia   . Blood transfusion without reported diagnosis   . Seizures (HCC)    one time incident, may have been r/t to a pain medication she was prescribed  . Sickle cell disease (HCC)   . Stroke [facility])    Current Status: Since her last office visit, she has c/o shortness of breath at rest and occasional chest pain on exertion. She is beginning to have daily headaches. She states that she is increasing nausea. She states that her has pain in arms and legs. She occasionally has pain in her left flank area. She rates her pain today at 0/10. She has not has a hospital visit for Sickle Cell Crisis since 09/06/2016 where she was treated and discharged the same day. She is currently taking all medications as prescribed and staying well hydrated. She reports occasional dizziness.    She denies fevers, chills, fatigue, recent infections, weight loss, and night sweats. No reports of GI problems such as vomiting, diarrhea, and constipation. She has no reports of blood in stools, dysuria and hematuria. No depression or anxiety reported.   Past Surgical History:  Procedure Laterality Date  . CESAREAN SECTION     x4  . CHOLECYSTECTOMY    . PORTACATH PLACEMENT Right w-3  . reverse tubal ligation    . TEE WITHOUT CARDIOVERSION N/A 09/10/2016   Procedure: TRANSESOPHAGEAL ECHOCARDIOGRAM (TEE);  Surgeon: Thurmon Fair, MD;  Location: [facility] ENDOSCOPY;  Service: Cardiovascular;  Laterality: N/A;    Family History  Problem Relation Age of Onset  . HIV/AIDS Father     Social  History   Socioeconomic History  . Marital status: Married    Spouse name: Not on file  . Number of children: 4  . Years of education: Not on file  . Highest education level: Not on file  Occupational History  . Occupation: Doesn't work  Engineer, production  . Financial resource strain: Not on file  . Food insecurity:    Worry: Not on file    Inability: Not on file  . Transportation needs:    Medical: Not on file    Non-medical: Not on file  Tobacco Use  . Smoking status: Never Smoker  . Smokeless tobacco: Never Used  Substance and Sexual Activity  . Alcohol use: No    Alcohol/week: 0.0 standard drinks    Comment: occasionally  . Drug use: No  . Sexual activity: Yes  Lifestyle  . Physical activity:    Days per week: Not on file    Minutes per session: Not on file  . Stress: Not on file  Relationships  . Social connections:    Talks on phone: Not on file    Gets together: Not on file    Attends religious service: Not on file    Active member of club or organization: Not on file    Attends meetings of clubs or organizations: Not on file    Relationship status: Not on file  . Intimate partner violence:    Fear of current  or ex partner: Not on file    Emotionally abused: Not on file    Physically abused: Not on file    Forced sexual activity: Not on file  Other Topics Concern  . Not on file  Social History Narrative  . Not on file    Outpatient Medications Prior to Visit  Medication Sig Dispense Refill  . albuterol (PROVENTIL HFA;VENTOLIN HFA) 108 (90 Base) MCG/ACT inhaler Inhale 2 puffs into the lungs every 4 (four) hours as needed for wheezing or shortness of breath (cough, shortness of breath or wheezing.). 1 Inhaler 1  . Ascorbic Acid (VITAMIN C) 1000 MG tablet Take 1,000 mg by mouth every evening.    Marland Kitchen aspirin EC 81 MG tablet Take 1 tablet by mouth every evening.     . Deferasirox (JADENU) 360 MG TABS Take 360 mg by mouth 2 (two) times daily.    . diclofenac sodium  (VOLTAREN) 1 % GEL Apply 2 g topically 4 (four) times daily. 100 g 1  . folic acid (FOLVITE) 1 MG tablet Take 1 mg by mouth daily.     . hydrOXYzine (ATARAX/VISTARIL) 25 MG tablet Take 0.5-1 tablets (12.5-25 mg total) by mouth every 8 (eight) hours as needed for itching. 60 tablet 1  . hydrOXYzine (VISTARIL) 25 MG capsule TAKE 1 CAPSULE BY MOUTH EVERY 6 HOURS AS NEEDED 60 capsule 1  . lamoTRIgine (LAMICTAL) 200 MG tablet TAKE 1 TABLET (200 MG TOTAL) BY MOUTH 2 TIMES DAILY. 180 tablet 3  . levETIRAcetam (KEPPRA) 1000 MG tablet TAKE 1 TABLET BY MOUTH 2 TIMES DAILY. 180 tablet 3  . lidocaine-prilocaine (EMLA) cream Apply 1 application topically as needed. 30 g 3  . meloxicam (MOBIC) 15 MG tablet Take 1 tablet (15 mg total) by mouth daily. 30 tablet 3  . omeprazole (PRILOSEC) 40 MG capsule Take 1 capsule (40 mg total) by mouth daily. 30 capsule 3  . OXYCONTIN 30 MG 12 hr tablet Take 1 tablet (30 mg total) by mouth 2 (two) times daily. 60 tablet 0  . promethazine (PHENERGAN) 25 MG tablet Take 1 tablet (25 mg total) by mouth every 8 (eight) hours as needed for nausea or vomiting. 1 tablet every 8 hours prn 30 tablet 2  . zolpidem (AMBIEN) 5 MG tablet Take 1 tablet (5 mg total) by mouth at bedtime as needed. sleep 30 tablet 0   No facility-administered medications prior to visit.     Allergies  Allergen Reactions  . Ibuprofen Hives  . Zofran [Ondansetron Hcl] Hives and Itching  . Demerol [Meperidine] Other (See Comments)    Pt has seizures  . Fentanyl And Related Nausea And Vomiting and Other (See Comments)    Very sedated  **Not allergic to the injection** JUST ALLERGIC TO PATCH  . Other Itching    Greek Yogurt  . Codeine Hives and Itching  . Hydrocodone Hives and Itching    ROS Review of Systems  Constitutional: Negative.   HENT: Negative.   Eyes: Negative.   Respiratory: Positive for shortness of breath (frequently).   Cardiovascular: Negative.   Gastrointestinal: Positive for  nausea.  Endocrine: Negative.   Genitourinary: Negative.   Musculoskeletal: Positive for arthralgias (generalized).  Skin: Negative.   Allergic/Immunologic: Negative.   Neurological: Positive for dizziness and headaches.  Hematological: Negative.   Psychiatric/Behavioral: Negative.    Objective:    Physical Exam  Constitutional: She is oriented to person, place, and time. She appears well-developed and well-nourished.  HENT:  Head: Normocephalic and  atraumatic.  Eyes: Conjunctivae are normal.  Neck: Normal range of motion. Neck supple.  Cardiovascular: Normal rate, regular rhythm, normal heart sounds and intact distal pulses.  Pulmonary/Chest: Effort normal and breath sounds normal.  Abdominal: Soft. Bowel sounds are normal.  Musculoskeletal: Normal range of motion.  Neurological: She is alert and oriented to person, place, and time. She has normal reflexes.  Skin: Skin is warm and dry.  Psychiatric: She has a normal mood and affect. Her behavior is normal. Judgment and thought content normal.   BP 118/64 (BP Location: Right Arm, Patient Position: Sitting, Cuff Size: Small)   Pulse 90   Temp 98.1 F (36.7 C) (Oral)   Ht 5\' 4"  (1.626 m)   Wt 165 lb 12.8 oz (75.2 kg)   SpO2 94%   BMI 28.46 kg/m  Wt Readings from Last 3 Encounters:  11/24/18 165 lb 12.8 oz (75.2 kg)  11/09/18 166 lb 8 oz (75.5 kg)  10/20/18 156 lb (70.8 kg)    There are no preventive care reminders to display for this patient.  There are no preventive care reminders to display for this patient.  Lab Results  Component Value Date   TSH 0.668 06/21/2016   Lab Results  Component Value Date   WBC 11.9 (H) 11/24/2018   HGB 7.3 (L) 11/24/2018   HCT 23.8 (L) 11/24/2018   MCV 92.6 11/24/2018   PLT 361 11/24/2018   Lab Results  Component Value Date   NA 137 11/24/2018   K 2.9 (L) 11/24/2018   CO2 26 11/24/2018   GLUCOSE 102 (H) 11/24/2018   BUN 7 11/24/2018   CREATININE 0.62 11/24/2018   BILITOT  3.3 (H) 11/24/2018   ALKPHOS 55 11/24/2018   AST 39 11/24/2018   ALT 22 11/24/2018   PROT 7.8 11/24/2018   ALBUMIN 4.3 11/24/2018   CALCIUM 8.4 (L) 11/24/2018   ANIONGAP 8 11/24/2018   Lab Results  Component Value Date   CHOL 128 09/07/2016   Lab Results  Component Value Date   HDL 26 (L) 09/07/2016   Lab Results  Component Value Date   LDLCALC 60 09/07/2016   Lab Results  Component Value Date   TRIG 211 (H) 09/07/2016   Lab Results  Component Value Date   CHOLHDL 4.9 09/07/2016   Lab Results  Component Value Date   HGBA1C <4.2 (L) 09/07/2016   Assessment & Plan:   1. Sickle cell pain crisis (HCC) She is experiencing crisis today. We [facility] and possible IV pain medications. She is refusing at this time. We will send her for Chest X-ray stat to r/o Acute Chest Syndrome. She will continue to take pain medications as prescribed; will continue to avoid extreme heat and cold; will continue to eat a healthy diet and drink at least 64 ounces of water daily; continue stool softener as needed; will avoid colds and flu; will continue to get plenty of sleep and rest; will continue to avoid high stressful situations and remain infection free; will continue Folic Acid 1 mg daily to avoid sickle cell crisis.   2. Chest pain, unspecified type Negative for Acute Chest Syndrome.  - DG Chest 2 View; Future  3. Chronic pain syndrome  4. Chronic, continuous use of opioids  5. Follow up She will follow up in 1 month.   No orders of the defined types were placed in this encounter.   Raliegh Ip,  MSN, FNP-C [facility] [address]  , [place] [address] [phone]  Problem List Items Addressed This Visit      Other   Chronic, continuous use of opioids   Sickle cell pain crisis [facility](HCC)    Other Visit Diagnoses    Chest pain, unspecified type    -  Primary   Relevant Orders    DG Chest 2 View (Completed)   Chronic pain syndrome       Follow up          No orders of the defined types were placed in this encounter.   Follow-up: Return in about 1 month (around 12/23/2018).    Kallie LocksNatalie M Keondra Haydu, FNP

## 2018-11-24 NOTE — Discharge Summary (Signed)
[facility] Dis[facility]   Patient ID: Debra Williamson MRN: [id number] DOB/AGE: [birth year] 40 y.o.  Admit date: 11/24/2018 Discharge date: 11/24/2018  Primary Care Physician:  Kallie LocksStroud, Natalie M, FNP  Admission Diagnoses:  Active Problems:   Sickle cell pain crisis [facility](HCC)   Discharge Medications:  Allergies as of 11/24/2018      Reactions   Ibuprofen Hives   Zofran [ondansetron Hcl] Hives, Itching   Demerol [meperidine] Other (See Comments)   Pt has seizures   Fentanyl And Related Nausea And Vomiting, Other (See Comments)   Very sedated  **Not allergic to the injection** JUST ALLERGIC TO PATCH   Other Itching   Greek Yogurt   Codeine Hives, Itching   Hydrocodone Hives, Itching      Medication List    TAKE these medications   albuterol 108 (90 Base) MCG/ACT inhaler Commonly known as:  PROVENTIL HFA;VENTOLIN HFA Inhale 2 puffs into the lungs every 4 (four) hours as needed for wheezing or shortness of breath (cough, shortness of breath or wheezing.).   aspirin EC 81 MG tablet Take 1 tablet by mouth every evening.   diclofenac sodium 1 % Gel Commonly known as:  VOLTAREN Apply 2 g topically 4 (four) times daily.   folic acid 1 MG tablet Commonly known as:  FOLVITE Take 1 mg by mouth daily.   hydrOXYzine 25 MG capsule Commonly known as:  VISTARIL TAKE 1 CAPSULE BY MOUTH EVERY 6 HOURS AS NEEDED   hydrOXYzine 25 MG tablet Commonly known as:  ATARAX/VISTARIL Take 0.5-1 tablets (12.5-25 mg total) by mouth every 8 (eight) hours as needed for itching.   JADENU 360 MG Tabs Generic drug:  Deferasirox Take 360 mg by mouth 2 (two) times daily.   lamoTRIgine 200 MG tablet Commonly known as:  LAMICTAL TAKE 1 TABLET (200 MG TOTAL) BY MOUTH 2 TIMES DAILY.   levETIRAcetam 1000 MG tablet Commonly known as:  KEPPRA TAKE 1 TABLET BY MOUTH 2 TIMES DAILY.   lidocaine-prilocaine cream Commonly known as:  EMLA Apply 1 application topically as needed.    meloxicam 15 MG tablet Commonly known as:  MOBIC Take 1 tablet (15 mg total) by mouth daily.   omeprazole 40 MG capsule Commonly known as:  PRILOSEC Take 1 capsule (40 mg total) by mouth daily.   OXYCONTIN 30 MG 12 hr tablet Generic drug:  oxyCODONE Take 1 tablet (30 mg total) by mouth 2 (two) times daily.   promethazine 25 MG tablet Commonly known as:  PHENERGAN Take 1 tablet (25 mg total) by mouth every 8 (eight) hours as needed for nausea or vomiting. 1 tablet every 8 hours prn   vitamin C 1000 MG tablet Take 1,000 mg by mouth every evening.   zolpidem 5 MG tablet Commonly known as:  AMBIEN Take 1 tablet (5 mg total) by mouth at bedtime as needed. sleep        Consults:  None  Significant Diagnostic Studies:  Dg Chest 2 View  Result Date: 11/24/2018 CLINICAL DATA:  Sickle cell disease EXAM: CHEST - 2 VIEW COMPARISON:  July 22, 2017 FINDINGS: Port-A-Cath tip is in the superior vena cava. No pneumothorax. There is no appreciable edema or consolidation. Heart is upper normal in size with pulmonary vascularity normal. No adenopathy. No bone lesions appreciable. IMPRESSION: Heart upper normal in size. Pulmonary vascularity normal. No adenopathy. No edema or consolidation. Port-A-Cath tip in superior vena cava. Electronically Signed   By: Bretta BangWilliam  Woodruff III M.D.   On:  11/24/2018 13:45     [facility] Course: Debra Williamson, a 38 year old female with a medical history significant of sickle cell anemia, chronic pain syndrome, seizure disorder, left hemiparesis, and history of CVA was admitted to sickle cell day infusion center for pain management and extended observation  Hemoglobin 7.3, which is consistent with patient's baseline.  No indication for blood transfusion at this time.  Review previous hematology notes, patient difficult to transfuse due to multiple antibodies.  WBCs 11.9, no signs of infection, patient afebrile.  Patien maintaining oxygen  saturation above 90% on RA.  D5 0.45% saline at 125 mL/h for cellular rehydration Dilaudid 1.5 mg Oxycodone 10 mg by mouth x1 Phenergan 12.5 mg IV x1 Benadryl 25 mg IVPB x1 Pain intensity decreased to 4/10.  Patient states that she can manage pain at home with current opiate medication regimen.  Patient alert, oriented, and ambulating without assistance. Patient discharged home with husband in a hemodynamically stable condition.  Discharge instructions: Resume all home medications Continue to hydrate with 64 ounces of fluid daily Avoid all stressors that precipitate sickle cell pain crisis Strict return precautions   Physical Exam at Discharge:  BP 113/61 (BP Location: Left Arm)   Pulse 95   Temp 98.5 F (36.9 C) (Oral)   SpO2 97%  Physical Exam Constitutional:      Appearance: Normal appearance.  HENT:     Head: Normocephalic.  Neck:     Musculoskeletal: Normal range of motion.  Cardiovascular:     Rate and Rhythm: Normal rate and regular rhythm.     Pulses: Normal pulses.     Heart sounds: Normal heart sounds.  Pulmonary:     Effort: Pulmonary effort is normal.     Breath sounds: Normal breath sounds.  Abdominal:     General: Bowel sounds are normal.  Musculoskeletal: Normal range of motion.  Skin:    General: Skin is warm and dry.  Neurological:     Mental Status: She is alert.     Motor: Weakness present.     Gait: Gait abnormal.      Disposition at Discharge: Discharge disposition: 01-Home or Self Care       Discharge Orders: Discharge Instructions    Discharge patient   Complete by:  As directed    Discharge disposition:  01-Home or Self Care   Discharge patient date:  11/24/2018      Condition at Discharge:   Stable  Time spent on Discharge:  20 minutes    Signed: Nolon NationsLachina Moore Xin Klawitter  APRN, MSN, FNP-C [facility] [address]  [place], [place] [address] [phone]  11/24/2018, 5:07 PM

## 2018-11-25 ENCOUNTER — Other Ambulatory Visit: Payer: Self-pay

## 2018-11-25 ENCOUNTER — Encounter (HOSPITAL_COMMUNITY): Payer: Self-pay

## 2018-11-25 ENCOUNTER — Emergency Department (HOSPITAL_COMMUNITY): Payer: Medicaid Other

## 2018-11-25 ENCOUNTER — Inpatient Hospital Stay (HOSPITAL_COMMUNITY)
Admission: EM | Admit: 2018-11-25 | Discharge: 2018-12-01 | DRG: 812 | Disposition: A | Discharge status: Home / Self Care | Payer: Medicaid Other | Attending: Internal Medicine | Admitting: Internal Medicine

## 2018-11-25 DIAGNOSIS — I272 Pulmonary hypertension, unspecified: Secondary | ICD-10-CM | POA: Diagnosis present

## 2018-11-25 DIAGNOSIS — I69354 Hemiplegia and hemiparesis following cerebral infarction affecting left non-dominant side: Secondary | ICD-10-CM

## 2018-11-25 DIAGNOSIS — Z79891 Long term (current) use of opiate analgesic: Secondary | ICD-10-CM

## 2018-11-25 DIAGNOSIS — Z79899 Other long term (current) drug therapy: Secondary | ICD-10-CM

## 2018-11-25 DIAGNOSIS — Z886 Allergy status to analgesic agent status: Secondary | ICD-10-CM

## 2018-11-25 DIAGNOSIS — Z830 Family history of human immunodeficiency virus [HIV] disease: Secondary | ICD-10-CM

## 2018-11-25 DIAGNOSIS — R079 Chest pain, unspecified: Secondary | ICD-10-CM | POA: Insufficient documentation

## 2018-11-25 DIAGNOSIS — D57219 Sickle-cell/Hb-C disease with crisis, unspecified: Secondary | ICD-10-CM | POA: Diagnosis not present

## 2018-11-25 DIAGNOSIS — D571 Sickle-cell disease without crisis: Secondary | ICD-10-CM

## 2018-11-25 DIAGNOSIS — D638 Anemia in other chronic diseases classified elsewhere: Secondary | ICD-10-CM | POA: Diagnosis present

## 2018-11-25 DIAGNOSIS — Z885 Allergy status to narcotic agent status: Secondary | ICD-10-CM

## 2018-11-25 DIAGNOSIS — Z7982 Long term (current) use of aspirin: Secondary | ICD-10-CM

## 2018-11-25 DIAGNOSIS — F1190 Opioid use, unspecified, uncomplicated: Secondary | ICD-10-CM

## 2018-11-25 DIAGNOSIS — D5700 Hb-SS disease with crisis, unspecified: Principal | ICD-10-CM | POA: Diagnosis present

## 2018-11-25 DIAGNOSIS — Z888 Allergy status to other drugs, medicaments and biological substances status: Secondary | ICD-10-CM

## 2018-11-25 DIAGNOSIS — G40909 Epilepsy, unspecified, not intractable, without status epilepticus: Secondary | ICD-10-CM

## 2018-11-25 DIAGNOSIS — L299 Pruritus, unspecified: Secondary | ICD-10-CM | POA: Diagnosis present

## 2018-11-25 DIAGNOSIS — R51 Headache: Secondary | ICD-10-CM | POA: Diagnosis not present

## 2018-11-25 DIAGNOSIS — R011 Cardiac murmur, unspecified: Secondary | ICD-10-CM | POA: Diagnosis present

## 2018-11-25 DIAGNOSIS — G894 Chronic pain syndrome: Secondary | ICD-10-CM | POA: Diagnosis present

## 2018-11-25 DIAGNOSIS — Z91018 Allergy to other foods: Secondary | ICD-10-CM

## 2018-11-25 LAB — I-STAT BETA HCG BLOOD, ED (NOT ORDERABLE): I-stat hCG, quantitative: <5 m[IU]/mL (ref <5)

## 2018-11-25 LAB — URINALYSIS, ROUTINE W REFLEX MICROSCOPIC
Bacteria, UA: NONE SEEN
Bilirubin Urine: NEGATIVE
Glucose, UA: NEGATIVE mg/dL
Ketones, ur: NEGATIVE mg/dL
Nitrite: NEGATIVE
Protein, ur: NEGATIVE mg/dL
Specific Gravity, Urine: 1.01 (ref 1.005–1.030)
pH: 7 (ref 5.0–8.0)

## 2018-11-25 LAB — CBC WITH DIFFERENTIAL/PLATELET
Abs Immature Granulocytes: 0.05 10*3/uL (ref 0.00–0.07)
Basophils Absolute: 0.1 10*3/uL (ref 0.0–0.1)
Basophils Relative: 1 %
Eosinophils Absolute: 0.2 10*3/uL (ref 0.0–0.5)
Eosinophils Relative: 2 %
HCT: 22.9 % — ABNORMAL LOW (ref 36.0–46.0)
Hemoglobin: 7.2 g/dL — ABNORMAL LOW (ref 12.0–15.0)
Immature Granulocytes: 1 %
Lymphocytes Relative: 24 %
Lymphs Abs: 2.2 10*3/uL (ref 0.7–4.0)
MCH: 29.4 pg (ref 26.0–34.0)
MCHC: 31.4 g/dL (ref 30.0–36.0)
MCV: 93.5 fL (ref 80.0–100.0)
Monocytes Absolute: 1.3 10*3/uL — ABNORMAL HIGH (ref 0.1–1.0)
Monocytes Relative: 14 %
Neutro Abs: 5.4 10*3/uL (ref 1.7–7.7)
Neutrophils Relative %: 58 %
Platelets: 424 10*3/uL — ABNORMAL HIGH (ref 150–400)
RBC: 2.45 MIL/uL — ABNORMAL LOW (ref 3.87–5.11)
RDW: 25.2 % — ABNORMAL HIGH (ref 11.5–15.5)
WBC: 9.1 10*3/uL (ref 4.0–10.5)
nRBC: 3.3 % — ABNORMAL HIGH (ref 0.0–0.2)

## 2018-11-25 LAB — COMPREHENSIVE METABOLIC PANEL
ALT: 25 U/L (ref 0–44)
AST: 39 U/L (ref 15–41)
Albumin: 4.2 g/dL (ref 3.5–5.0)
Alkaline Phosphatase: 53 U/L (ref 38–126)
Anion gap: 5 (ref 5–15)
BUN: 9 mg/dL (ref 6–20)
CO2: 26 mmol/L (ref 22–32)
Calcium: 8.8 mg/dL — ABNORMAL LOW (ref 8.9–10.3)
Chloride: 107 mmol/L (ref 98–111)
Creatinine, Ser: 0.52 mg/dL (ref 0.44–1.00)
GFR calc Af Amer: >60 mL/min (ref >60)
GFR calc non Af Amer: >60 mL/min (ref >60)
Glucose, Bld: 102 mg/dL — ABNORMAL HIGH (ref 70–99)
Potassium: 3.8 mmol/L (ref 3.5–5.1)
Sodium: 138 mmol/L (ref 135–145)
Total Bilirubin: 2.2 mg/dL — ABNORMAL HIGH (ref 0.3–1.2)
Total Protein: 7.3 g/dL (ref 6.5–8.1)

## 2018-11-25 LAB — RETICULOCYTES
Immature Retic Fract: 38.9 % — ABNORMAL HIGH (ref 2.3–15.9)
RBC.: 2.45 MIL/uL — ABNORMAL LOW (ref 3.87–5.11)
Retic Count, Absolute: 489.5 10*3/uL — ABNORMAL HIGH (ref 19.0–186.0)
Retic Ct Pct: 20 % — ABNORMAL HIGH (ref 0.4–3.1)

## 2018-11-25 LAB — LACTATE DEHYDROGENASE: LDH: 317 U/L — ABNORMAL HIGH (ref 98–192)

## 2018-11-25 MED ORDER — HYDROMORPHONE HCL 2 MG/ML IJ SOLN: 2.0000 mg | INTRAMUSCULAR | Status: AC

## 2018-11-25 MED ORDER — HYDROMORPHONE HCL 1 MG/ML IJ SOLN
1.0000 mg | Freq: Once | INTRAMUSCULAR | Status: AC
  Administered 2018-11-25: 1 mg via INTRAVENOUS
  Filled 2018-11-25: qty 1

## 2018-11-25 MED ORDER — POLYETHYLENE GLYCOL 3350 17 G PO PACK: 17.0000 g | PACK | Freq: Every day | ORAL | Status: DC | PRN

## 2018-11-25 MED ORDER — ASPIRIN EC 81 MG PO TBEC
81.0000 mg | DELAYED_RELEASE_TABLET | Freq: Every evening | ORAL | Status: DC
  Administered 2018-11-26 – 2018-11-30 (×5): 81 mg via ORAL
  Filled 2018-11-25 (×5): qty 1

## 2018-11-25 MED ORDER — SODIUM CHLORIDE 0.9% FLUSH
3.0000 mL | Freq: Two times a day (BID) | INTRAVENOUS | Status: DC
  Administered 2018-11-26 – 2018-11-27 (×3): 3 mL via INTRAVENOUS

## 2018-11-25 MED ORDER — BISACODYL 10 MG RE SUPP
10.0000 mg | Freq: Every day | RECTAL | Status: DC | PRN
  Administered 2018-11-26 – 2018-11-30 (×2): 10 mg via RECTAL
  Filled 2018-11-25 (×2): qty 1

## 2018-11-25 MED ORDER — FOLIC ACID 1 MG PO TABS
5.0000 mg | ORAL_TABLET | Freq: Every day | ORAL | Status: DC
  Administered 2018-11-26 – 2018-12-01 (×6): 5 mg via ORAL
  Filled 2018-11-25 (×8): qty 5

## 2018-11-25 MED ORDER — DEFERASIROX 360 MG PO TABS
360.0000 mg | ORAL_TABLET | Freq: Two times a day (BID) | ORAL | Status: DC
  Administered 2018-11-29 – 2018-12-01 (×3): 360 mg via ORAL

## 2018-11-25 MED ORDER — SODIUM CHLORIDE 0.9% FLUSH
3.0000 mL | Freq: Once | INTRAVENOUS | Status: AC
  Administered 2018-11-25: 3 mL via INTRAVENOUS

## 2018-11-25 MED ORDER — LAMOTRIGINE 100 MG PO TABS
200.0000 mg | ORAL_TABLET | Freq: Two times a day (BID) | ORAL | Status: DC
  Administered 2018-11-26 – 2018-12-01 (×12): 200 mg via ORAL
  Filled 2018-11-25 (×13): qty 2

## 2018-11-25 MED ORDER — HYDROMORPHONE HCL 2 MG/ML IJ SOLN
2.0000 mg | INTRAMUSCULAR | Status: AC
  Administered 2018-11-25: 2 mg via INTRAVENOUS
  Filled 2018-11-25: qty 1

## 2018-11-25 MED ORDER — HYDROMORPHONE HCL 2 MG/ML IJ SOLN
2.0000 mg | INTRAMUSCULAR | Status: AC
Start: 2018-11-25 — End: 2018-11-25

## 2018-11-25 MED ORDER — ENOXAPARIN SODIUM 40 MG/0.4ML ~~LOC~~ SOLN
40.0000 mg | Freq: Every day | SUBCUTANEOUS | Status: DC
  Administered 2018-11-26 – 2018-12-01 (×6): 40 mg via SUBCUTANEOUS
  Filled 2018-11-25 (×6): qty 0.4

## 2018-11-25 MED ORDER — HYDROMORPHONE HCL 2 MG/ML IJ SOLN
2.0000 mg | INTRAMUSCULAR | Status: AC
Start: 2018-11-25 — End: 2018-11-26

## 2018-11-25 MED ORDER — SODIUM CHLORIDE 0.9 % IV SOLN
12.5000 mg | INTRAVENOUS | Status: DC | PRN
  Administered 2018-11-26 – 2018-11-29 (×5): 12.5 mg via INTRAVENOUS
  Filled 2018-11-25 (×4): qty 0.25
  Filled 2018-11-25: qty 12.5
  Filled 2018-11-25: qty 0.25

## 2018-11-25 MED ORDER — LEVETIRACETAM 500 MG PO TABS
1000.0000 mg | ORAL_TABLET | Freq: Two times a day (BID) | ORAL | Status: DC
  Administered 2018-11-26 – 2018-12-01 (×12): 1000 mg via ORAL
  Filled 2018-11-25 (×13): qty 2

## 2018-11-25 MED ORDER — DIPHENHYDRAMINE HCL 50 MG/ML IJ SOLN
12.5000 mg | Freq: Once | INTRAMUSCULAR | Status: AC
  Administered 2018-11-25: 12.5 mg via INTRAVENOUS
  Filled 2018-11-25: qty 1

## 2018-11-25 MED ORDER — HYDROMORPHONE HCL 2 MG/ML IJ SOLN: 2.0000 mg | INTRAMUSCULAR | Status: DC | PRN

## 2018-11-25 MED ORDER — OXYCODONE HCL ER 15 MG PO T12A
30.0000 mg | EXTENDED_RELEASE_TABLET | Freq: Two times a day (BID) | ORAL | Status: DC
  Administered 2018-11-26 – 2018-12-01 (×12): 30 mg via ORAL
  Filled 2018-11-25 (×14): qty 2

## 2018-11-25 MED ORDER — LIDOCAINE-PRILOCAINE 2.5-2.5 % EX CREA
1.0000 "application " | TOPICAL_CREAM | CUTANEOUS | Status: DC | PRN
  Filled 2018-11-25: qty 5

## 2018-11-25 MED ORDER — ALBUTEROL SULFATE HFA 108 (90 BASE) MCG/ACT IN AERS
2.0000 | INHALATION_SPRAY | RESPIRATORY_TRACT | Status: DC | PRN
  Filled 2018-11-25: qty 6.7

## 2018-11-25 MED ORDER — PROMETHAZINE HCL 25 MG/ML IJ SOLN
12.5000 mg | Freq: Once | INTRAMUSCULAR | Status: AC
  Administered 2018-11-25: 12.5 mg via INTRAVENOUS
  Filled 2018-11-25: qty 1

## 2018-11-25 MED ORDER — DEXTROSE-NACL 5-0.45 % IV SOLN
INTRAVENOUS | Status: DC
Start: 2018-11-25 — End: 2018-11-25
  Administered 2018-11-25: 20:00:00 via INTRAVENOUS

## 2018-11-25 MED ORDER — SODIUM CHLORIDE 0.9 % IV BOLUS
1000.0000 mL | Freq: Once | INTRAVENOUS | Status: AC
  Administered 2018-11-26: 1000 mL via INTRAVENOUS

## 2018-11-25 MED ORDER — DEXTROSE-NACL 5-0.45 % IV SOLN
INTRAVENOUS | Status: AC
  Administered 2018-11-26: 03:00:00 via INTRAVENOUS

## 2018-11-25 MED ORDER — OXYCODONE HCL 5 MG PO TABS
10.0000 mg | ORAL_TABLET | Freq: Once | ORAL | Status: AC
  Administered 2018-11-25: 10 mg via ORAL
  Filled 2018-11-25: qty 2

## 2018-11-25 MED ORDER — PROMETHAZINE HCL 25 MG/ML IJ SOLN
12.5000 mg | Freq: Four times a day (QID) | INTRAMUSCULAR | Status: DC | PRN
  Administered 2018-11-26 – 2018-12-01 (×20): 12.5 mg via INTRAVENOUS
  Filled 2018-11-25 (×21): qty 1

## 2018-11-25 NOTE — ED Triage Notes (Signed)
Pt states that she is having a sickle cell pain crisis. Pt also c/o headache for 1 month. Pt's home meds of OxyContin 30 and oxycodone 10mg  have not helped

## 2018-11-25 NOTE — ED Notes (Signed)
ED TO INPATIENT HANDOFF REPORT  Name/Age/Gender Debra Williamson 38 y.o. female  Code Status Code Status History    Date Active Date Inactive Code Status Order ID Comments User Context   11/24/2018 1235 11/24/2018 2050 Full Code [id number]  Massie MaroonHollis, Lachina M, FNP Inpatient   09/07/2016 0242 09/10/2016 2251 Full Code [id number]  Therisa Doyneoutova, Anastassia, MD Inpatient   09/07/2016 0242 09/07/2016 0242 Full Code [id number]  Therisa Doyneoutova, Anastassia, MD Inpatient   10/27/2015 0410 10/29/2015 2105 Full Code [id number]  Clydie BraunSmith, Rondell A, MD Inpatient   08/08/2015 2042 08/14/2015 1831 Full Code [id number]  Altha HarmMatthews, Michelle A, MD Inpatient   03/11/2015 2303 03/13/2015 1700 Full Code [id number]  Hillary BowGardner, Jared M, DO ED      Home/SNF/Other Home  Chief Complaint sickle cell crisis  Level of Care/Admitting Diagnosis ED Disposition    ED Disposition Condition Comment   Admit  [facility] [[id number][facility]  Level of Care: Med-Surg [16]  Diagnosis: Vaso-occlusive sickle cell crisis [facility](HCC) [[id number]][[id number]]  Admitting Physician: Elder LoveKUHLMAN, PATRICK D [[id number]]  Attending Physician: Elder LoveKUHLMAN, PATRICK D [[id number]]  PT Class (Do Not Modify): Observation [104]  PT Acc Code (Do Not Modify): Observation [10022]       Medical History Past Medical History:  Diagnosis Date  . Anemia   . Blood transfusion without reported diagnosis   . Seizures (HCC)    one time incident, may have been r/t to a pain medication she was prescribed  . Sickle cell disease (HCC)   . Stroke [facility](HCC)     Allergies Allergies  Allergen Reactions  . Ibuprofen Hives  . Zofran [Ondansetron Hcl] Hives and Itching  . Demerol [Meperidine] Other (See Comments)    Pt has seizures  . Fentanyl And Related Nausea And Vomiting and Other (See Comments)    Very sedated  **Not allergic to the injection** JUST ALLERGIC TO PATCH  . Other Itching    Greek Yogurt  . Codeine Hives and Itching  . Hydrocodone Hives and Itching    IV  Location/Drains/Wounds Patient Lines/Drains/Airways Status   Active Line/Drains/Airways    Name:   Placement date:   Placement time:   Site:   Days:   Implanted Port 07/18/15 Right Chest   07/18/15    -    Chest   1226   Implanted Port 07/18/15 Right Chest   07/18/15    -    Chest   1226   Implanted Port 11/25/18 Right Chest   11/25/18    1700    Chest   less than 1          Labs/Imaging Results for orders placed or performed during the hospital encounter of 11/25/18 (from the past 48 hour(s))  Comprehensive metabolic panel     Status: Abnormal   Collection Time: 11/25/18  4:55 PM  Result Value Ref Range   Sodium 138 135 - 145 mmol/L   Potassium 3.8 3.5 - 5.1 mmol/L    Comment: DELTA CHECK NOTED NO VISIBLE HEMOLYSIS    Chloride 107 98 - 111 mmol/L   CO2 26 22 - 32 mmol/L   Glucose, Bld 102 (H) 70 - 99 mg/dL   BUN 9 6 - 20 mg/dL   Creatinine, Ser 0.630.52 0.44 - 1.00 mg/dL   Calcium 8.8 (L) 8.9 - 10.3 mg/dL   Total Protein 7.3 6.5 - 8.1 g/dL   Albumin 4.2 3.5 - 5.0 g/dL   AST 39 15 - 41 U/L   ALT 25 0 - 44  U/L   Alkaline Phosphatase 53 38 - 126 U/L   Total Bilirubin 2.2 (H) 0.3 - 1.2 mg/dL   GFR calc non Af Amer >60 >60 mL/min   GFR calc Af Amer >60 >60 mL/min   Anion gap 5 5 - 15    Comment: Performed at [facility], [address], [place], [place] [address]  CBC with Differential     Status: Abnormal   Collection Time: 11/25/18  4:55 PM  Result Value Ref Range   WBC 9.1 4.0 - 10.5 K/uL    Comment: WHITE COUNT CONFIRMED ON SMEAR   RBC 2.45 (L) 3.87 - 5.11 MIL/uL   Hemoglobin 7.2 (L) 12.0 - 15.0 g/dL   HCT 13.0 (L) 86.5 - 78.4 %   MCV 93.5 80.0 - 100.0 fL   MCH 29.4 26.0 - 34.0 pg   MCHC 31.4 30.0 - 36.0 g/dL   RDW 69.6 (H) 29.5 - 28.4 %   Platelets 424 (H) 150 - 400 K/uL   nRBC 3.3 (H) 0.0 - 0.2 %   Neutrophils Relative % 58 %   Neutro Abs 5.4 1.7 - 7.7 K/uL   Lymphocytes Relative 24 %   Lymphs Abs 2.2 0.7 - 4.0 K/uL   Monocytes Relative 14 %    Monocytes Absolute 1.3 (H) 0.1 - 1.0 K/uL   Eosinophils Relative 2 %   Eosinophils Absolute 0.2 0.0 - 0.5 K/uL   Basophils Relative 1 %   Basophils Absolute 0.1 0.0 - 0.1 K/uL   WBC Morphology VACUOLATED NEUTROPHILS    Immature Granulocytes 1 %   Abs Immature Granulocytes 0.05 0.00 - 0.07 K/uL   Pappenheimer Bodies PRESENT    Carollee Massed Bodies PRESENT    Polychromasia PRESENT    Sickle Cells PRESENT    Target Cells PRESENT     Comment: Performed at [facility], [address], [place], [place] [address]  Reticulocytes     Status: Abnormal   Collection Time: 11/25/18  4:55 PM  Result Value Ref Range   Retic Ct Pct 20.0 (H) 0.4 - 3.1 %   RBC. 2.45 (L) 3.87 - 5.11 MIL/uL   Retic Count, Absolute 489.5 (H) 19.0 - 186.0 K/uL   Immature Retic Fract 38.9 (H) 2.3 - 15.9 %    Comment: Performed at [facility], [address], [place], [place] [address]  I-Stat beta hCG blood, ED     Status: None   Collection Time: 11/25/18  5:00 PM  Result Value Ref Range   I-stat hCG, quantitative <5.0 <5 mIU/mL   Comment 3            Comment:   GEST. AGE      CONC.  (mIU/mL)   <=1 WEEK        5 - 50     2 WEEKS       50 - 500     3 WEEKS       100 - 10,000     4 WEEKS     1,000 - 30,000        FEMALE AND NON-PREGNANT FEMALE:     LESS THAN 5 mIU/mL   Urinalysis, Routine w reflex microscopic     Status: Abnormal   Collection Time: 11/25/18  8:06 PM  Result Value Ref Range   Color, Urine AMBER (A) YELLOW    Comment: BIOCHEMICALS MAY BE AFFECTED BY COLOR   APPearance CLEAR CLEAR   Specific Gravity, Urine  1.010 1.005 - 1.030   pH 7.0 5.0 - 8.0   Glucose, UA NEGATIVE NEGATIVE mg/dL   Hgb urine dipstick SMALL (A) NEGATIVE   Bilirubin Urine NEGATIVE NEGATIVE   Ketones, ur NEGATIVE NEGATIVE mg/dL   Protein, ur NEGATIVE NEGATIVE mg/dL   Nitrite NEGATIVE NEGATIVE   Leukocytes, UA SMALL (A) NEGATIVE   RBC / HPF 0-5 0 - 5 RBC/hpf   WBC, UA 6-10 0 - 5 WBC/hpf    Bacteria, UA NONE SEEN NONE SEEN   Squamous Epithelial / LPF 11-20 0 - 5    Comment: Performed at [facility], [address], [place], [place] [address]   Dg Chest 2 View  Result Date: 11/25/2018 CLINICAL DATA:  Sickle cell crisis EXAM: CHEST - 2 VIEW COMPARISON:  November 24, 2018 FINDINGS: Port-A-Cath tip is in the superior vena cava, unchanged. No pneumothorax. No edema or consolidation. Heart is upper normal in size with pulmonary vascularity within normal limits. No adenopathy. No evident bone lesions. IMPRESSION: No edema or consolidation. Stable cardiac silhouette. No appreciable change compared to 1 day prior. Electronically Signed   By: Bretta Bang III M.D.   On: 11/25/2018 16:18   Dg Chest 2 View  Result Date: 11/24/2018 CLINICAL DATA:  Sickle cell disease EXAM: CHEST - 2 VIEW COMPARISON:  July 22, 2017 FINDINGS: Port-A-Cath tip is in the superior vena cava. No pneumothorax. There is no appreciable edema or consolidation. Heart is upper normal in size with pulmonary vascularity normal. No adenopathy. No bone lesions appreciable. IMPRESSION: Heart upper normal in size. Pulmonary vascularity normal. No adenopathy. No edema or consolidation. Port-A-Cath tip in superior vena cava. Electronically Signed   By: Bretta Bang III M.D.   On: 11/24/2018 13:45   Ct Head Wo Contrast  Result Date: 11/25/2018 CLINICAL DATA:  Sickle cell pain crisis. Headache for 1 month. EXAM: CT HEAD WITHOUT CONTRAST TECHNIQUE: Contiguous axial images were obtained from the base of the skull through the vertex without intravenous contrast. COMPARISON:  MR brain 09/07/2016. CT head 09/07/2016. 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 No acute stroke, hemorrhage, mass lesion, hydrocephalus, or extra-axial fluid. Vascular: No hyperdense vessel or unexpected calcification. Skull: Normal. Negative for  fracture or focal lesion. There may be mild expansion of the diploic space due to anemia. Sinuses/Orbits: No acute findings. Other: None. IMPRESSION: Stable chronic RIGHT hemisphere infarct. No acute intracranial findings. Electronically Signed   By: Elsie Stain M.D.   On: 11/25/2018 18:33    Pending Labs [employer] (From admission, onward)    Start     Ordered   Signed and Held  HIV antibody (Routine Testing)  Add-on,   R     Signed and Held   Signed and Held  CBC  Tomorrow morning,   R     Signed and Held   Signed and Held  Comprehensive metabolic panel  Tomorrow morning,   R     Signed and Held   Signed and Held  Reticulocytes  Tomorrow morning,   R     Signed and Held   Signed and Held  Lactate dehydrogenase  Add-on,   R     Signed and Held   Signed and Held  Lactate dehydrogenase  Tomorrow morning,   R     Signed and Held          Vitals/Pain Today's Vitals   11/25/18 2013 11/25/18 2030  11/25/18 2151 11/25/18 2154  BP:  107/61 (!) 105/44   Pulse:  82 89   Resp:  10 (!) 21   Temp:      TempSrc:      SpO2:  90% 96%   PainSc: 7  7   7      Isolation Precautions No active isolations  Medications Medications  dextrose 5 %-0.45 % sodium chloride infusion ( Intravenous New Bag/Given 11/25/18 2002)  HYDROmorphone (DILAUDID) injection 2 mg (2 mg Intravenous Given 11/25/18 2232)    Or  HYDROmorphone (DILAUDID) injection 2 mg ( Subcutaneous See Alternative 11/25/18 2232)  sodium chloride flush (NS) 0.9 % injection 3 mL (3 mLs Intravenous Given 11/25/18 1700)  HYDROmorphone (DILAUDID) injection 1 mg (1 mg Intravenous Given 11/25/18 1802)  oxyCODONE (Oxy IR/ROXICODONE) immediate release tablet 10 mg (10 mg Oral Given 11/25/18 1759)  promethazine (PHENERGAN) injection 12.5 mg (12.5 mg Intravenous Given 11/25/18 1802)  diphenhydrAMINE (BENADRYL) injection 12.5 mg (12.5 mg Intravenous Given 11/25/18 1800)  HYDROmorphone (DILAUDID) injection 2 mg (2 mg Intravenous Given 11/25/18 2005)    Or   HYDROmorphone (DILAUDID) injection 2 mg ( Subcutaneous See Alternative 11/25/18 2005)  HYDROmorphone (DILAUDID) injection 2 mg (2 mg Intravenous Given 11/25/18 2045)    Or  HYDROmorphone (DILAUDID) injection 2 mg ( Subcutaneous See Alternative 11/25/18 2045)  HYDROmorphone (DILAUDID) injection 2 mg (2 mg Intravenous Given 11/25/18 2117)    Or  HYDROmorphone (DILAUDID) injection 2 mg ( Subcutaneous See Alternative 11/25/18 2117)  diphenhydrAMINE (BENADRYL) injection 12.5 mg (12.5 mg Intravenous Given 11/25/18 2115)    Mobility walks

## 2018-11-25 NOTE — ED Notes (Signed)
Pt ambulated to the bathroom without any assistance without any assistance. Gait steady

## 2018-11-25 NOTE — ED Notes (Signed)
Pt requesting to be put on 2L of O2.

## 2018-11-25 NOTE — ED Notes (Signed)
Urine and culture sent to lab  

## 2018-11-25 NOTE — ED Notes (Signed)
Pt given apple juice  

## 2018-11-25 NOTE — ED Notes (Signed)
Pt given mac n cheese and graham crackers. Pt appears upset over pain stating "I just dont know why it isnt getting better." Will continue to monitor

## 2018-11-25 NOTE — ED Notes (Signed)
Admitting MD at bedside.

## 2018-11-25 NOTE — ED Provider Notes (Signed)
[facility]-EMERGENCY DEPT Provider Note   CSN: [id number] Arrival date & time: 11/25/18  1551     History   Chief Complaint Chief Complaint  Patient presents with  . Sickle Cell Pain Crisis    HPI Debra Williamson is a 38 y.o. female.  HPI    She presents for evaluation of "pain all over."  Pain was better yesterday after she was treated at the sickle cell clinic with the following medications, however returned 2 hours after she left the clinic: D5 0.45% saline at 125 mL/h for cellular rehydration Dilaudid 1.5 mg Oxycodone 10 mg by mouth x1 Phenergan 12.5 mg IV x1 Benadryl 25 mg IVPB x1 Pain intensity decreased to 4/10.  She also complains of intermittent headache, which occurs in different parts of her head, and has been present almost daily for 1 month.  She denies trauma.  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  She denies fever, chills, cough, shortness of breath, change in bowel or urinary habits.  She is taking her usual medications.  The patient received a short bag [facility], [facility].  Planned repeat 12/14/2018.  There are no other known modifying factors.  Past Medical History:  Diagnosis Date  . Anemia   . Blood transfusion without reported diagnosis   . Seizures (HCC)    one time incident, may have been r/t to a pain medication she was prescribed  . Sickle cell disease (HCC)   . Stroke [facility](HCC)     Patient Active Problem List   Diagnosis Date Noted  . Chest pain 11/25/2018  . Sickle cell pain crisis (HCC) 11/24/2018  . Chronic, continuous use of opioids 10/27/2017  . 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 TIA (transient ischemic attack)   . Vertigo 09/07/2016  . Dyspnea 09/07/2016  . Lightheadedness 09/06/2016  . Healthcare maintenance 07/30/2016  . Dizziness, nonspecific 07/30/2016  . Left foot drop 07/30/2016  . Left-sided weakness 07/30/2016  .  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 History of stroke associated with blood clotting tendency 01/02/2016  .  disease (HCC) 10/27/2015  . Hb-SS disease with acute chest syndrome (HCC) 08/14/2015  . Acute chest syndrome (HCC)   . Acute respiratory failure with hypoxemia (HCC)   . Community acquired pneumonia 08/08/2015  . Hemiparesis following cerebrovascular accident (CVA) (HCC) 05/30/2015  . Ankle gives out 05/30/2015  . Seizure disorder (HCC) 05/30/2015  . 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 H/O: stroke 03/12/2015  . Seizures (HCC) 03/12/2015  . Sickle cell disease (HCC) 03/11/2015  . Symptomatic anemia 03/11/2015    Past Surgical History:  Procedure Laterality Date  . CESAREAN SECTION     x4  . CHOLECYSTECTOMY    . PORTACATH PLACEMENT Right w-3  . reverse tubal ligation    . TEE WITHOUT CARDIOVERSION N/A 09/10/2016   Procedure: TRANSESOPHAGEAL ECHOCARDIOGRAM (TEE);  Surgeon: Thurmon FairMihai Croitoru, MD;  [facility];  Service: Cardiovascular;  Laterality: N/A;     OB History   No obstetric history on file.      Home Medications    Prior to Admission medications   Medication Sig Start Date End Date Taking? Authorizing Provider  albuterol (PROVENTIL HFA;VENTOLIN HFA) 108 (90 Base) MCG/ACT inhaler Inhale 2 puffs into the lungs every 4 (four) hours as needed for wheezing or shortness of  breath (cough, shortness of breath or wheezing.). 02/07/17  Yes Bing Neighbors, FNP  aspirin EC 81 MG tablet Take 1 tablet by mouth every evening.  11/03/13  Yes [provider]  Deferasirox (JADENU) 360 MG TABS Take 360 mg by mouth 2 (two) times daily.   Yes [provider]  folic acid (FOLVITE) 1 MG tablet Take 1 mg by mouth daily.  02/13/15  Yes [provider]  hydrOXYzine (ATARAX/VISTARIL) 25 MG tablet  Take 0.5-1 tablets (12.5-25 mg total) by mouth every 8 (eight) hours as needed for itching. 10/19/18  Yes Kallie Locks, FNP  hydrOXYzine (VISTARIL) 25 MG capsule TAKE 1 CAPSULE BY MOUTH EVERY 6 HOURS AS NEEDED 10/02/18  Yes Kallie Locks, FNP  lamoTRIgine (LAMICTAL) 200 MG tablet TAKE 1 TABLET (200 MG TOTAL) BY MOUTH 2 TIMES DAILY. 07/02/18  Yes Kallie Locks, FNP  levETIRAcetam (KEPPRA) 1000 MG tablet TAKE 1 TABLET BY MOUTH 2 TIMES DAILY. 07/02/18  Yes Kallie Locks, FNP  lidocaine-prilocaine (EMLA) cream Apply 1 application topically as needed. 04/27/18  Yes Kallie Locks, FNP  OXYCONTIN 30 MG 12 hr tablet Take 1 tablet (30 mg total) by mouth 2 (two) times daily. 10/30/18 11/29/18 Yes Kallie Locks, FNP  promethazine (PHENERGAN) 25 MG tablet Take 1 tablet (25 mg total) by mouth every 8 (eight) hours as needed for nausea or vomiting. 1 tablet every 8 hours prn 09/30/18  Yes Kallie Locks, FNP  zolpidem (AMBIEN) 5 MG tablet Take 1 tablet (5 mg total) by mouth at bedtime as needed. sleep 10/20/18  Yes Kallie Locks, FNP  diclofenac sodium (VOLTAREN) 1 % GEL Apply 2 g topically 4 (four) times daily. Patient not taking: Reported on 11/25/2018 01/19/18   Bing Neighbors, FNP  meloxicam (MOBIC) 15 MG tablet Take 1 tablet (15 mg total) by mouth daily. Patient not taking: Reported on 11/25/2018 06/08/18   Kallie Locks, FNP  omeprazole (PRILOSEC) 40 MG capsule Take 1 capsule (40 mg total) by mouth daily. Patient not taking: Reported on 11/25/2018 05/13/17   Bing Neighbors, FNP    Family History Family History  Problem Relation Age of Onset  . HIV/AIDS Father     Social History Social History   Tobacco Use  . Smoking status: Never Smoker  . Smokeless tobacco: Never Used  Substance Use Topics  . Alcohol use: No    Alcohol/week: 0.0 standard drinks    Comment: occasionally  . Drug use: No     Allergies   Ibuprofen; Zofran [ondansetron hcl]; Demerol [meperidine];  Fentanyl and related; Other; Codeine; and Hydrocodone   Review of Systems Review of Systems  All other systems reviewed and are negative.    Physical Exam Updated Vital Signs BP 107/61   Pulse 82   Temp 98.4 F (36.9 C) (Oral)   Resp 10   SpO2 90%   Physical Exam Vitals signs and nursing note reviewed.  Constitutional:      General: She is not in acute distress.    Appearance: She is well-developed and normal weight. She is not ill-appearing, toxic-appearing or diaphoretic.  HENT:     Head: Normocephalic and atraumatic.  Eyes:     Conjunctiva/sclera: Conjunctivae normal.     Pupils: Pupils are equal, round, and reactive to light.  Neck:     Musculoskeletal: Normal range of motion and neck supple.     Trachea: Phonation normal.  Cardiovascular:     Rate and Rhythm:  Normal rate and regular rhythm.  Pulmonary:     Effort: Pulmonary effort is normal.     Breath sounds: Normal breath sounds.  Chest:     Chest wall: No tenderness.  Abdominal:     General: There is no distension.     Palpations: Abdomen is soft.     Tenderness: There is no abdominal tenderness. There is no guarding.  Musculoskeletal: Normal range of motion.  Skin:    General: Skin is warm and dry.  Neurological:     Mental Status: She is alert and oriented to person, place, and time.     Cranial Nerves: No cranial nerve deficit.     Motor: No abnormal muscle tone.  Psychiatric:        Behavior: Behavior normal.        Thought Content: Thought content normal.        Judgment: Judgment normal.      ED Treatments / Results  Labs (all labs ordered are listed, but only abnormal results are displayed) Labs Reviewed  COMPREHENSIVE METABOLIC PANEL - Abnormal; Notable for the following components:      Result Value   Glucose, Bld 102 (*)    Calcium 8.8 (*)    Total Bilirubin 2.2 (*)    All other components within normal limits  CBC WITH DIFFERENTIAL/PLATELET - Abnormal; Notable for the following  components:   RBC 2.45 (*)    Hemoglobin 7.2 (*)    HCT 22.9 (*)    RDW 25.2 (*)    Platelets 424 (*)    nRBC 3.3 (*)    Monocytes Absolute 1.3 (*)    All other components within normal limits  RETICULOCYTES - Abnormal; Notable for the following components:   Retic Ct Pct 20.0 (*)    RBC. 2.45 (*)    Retic Count, Absolute 489.5 (*)    Immature Retic Fract 38.9 (*)    All other components within normal limits  URINALYSIS, ROUTINE W REFLEX MICROSCOPIC - Abnormal; Notable for the following components:   Color, Urine AMBER (*)    Hgb urine dipstick SMALL (*)    Leukocytes, UA SMALL (*)    All other components within normal limits  I-STAT BETA HCG BLOOD, ED ([facility], [facility], [facility] ONLY)  I-STAT BETA HCG BLOOD, ED (NOT ORDERABLE)    EKG EKG Interpretation  Date/Time:  Wednesday November 25 2018 16:04:37 EST Ventricular Rate:  86 PR Interval:    QRS Duration: 86 QT Interval:  385 QTC Calculation: 461 R Axis:   43 Text Interpretation:  Sinus rhythm Abnormal R-wave progression, early transition Baseline wander in lead(s) V2 since last tracing no significant change Confirmed by Mancel BaleWentz, Domino Holten [phone]) on 11/25/2018 4:36:44 PM Also confirmed by Mancel BaleWentz, Christina Gintz [phone]), editor Lanae BoastBrewer, Glenn [phone])  on 11/25/2018 4:48:27 PM   Radiology Dg Chest 2 View  Result Date: 11/25/2018 CLINICAL DATA:  Sickle cell crisis EXAM: CHEST - 2 VIEW COMPARISON:  November 24, 2018 FINDINGS: Port-A-Cath tip is in the superior vena cava, unchanged. No pneumothorax. No edema or consolidation. Heart is upper normal in size with pulmonary vascularity within normal limits. No adenopathy. No evident bone lesions. IMPRESSION: No edema or consolidation. Stable cardiac silhouette. No appreciable change compared to 1 day prior. Electronically Signed   By: Bretta BangWilliam  Woodruff III M.D.   On: 11/25/2018 16:18   Dg Chest 2 View  Result Date: 11/24/2018 CLINICAL DATA:  Sickle cell disease EXAM: CHEST - 2 VIEW COMPARISON:  July 22, 2017  FINDINGS: Port-A-Cath tip is in the superior vena cava. No pneumothorax. There is no appreciable edema or consolidation. Heart is upper normal in size with pulmonary vascularity normal. No adenopathy. No bone lesions appreciable. IMPRESSION: Heart upper normal in size. Pulmonary vascularity normal. No adenopathy. No edema or consolidation. Port-A-Cath tip in superior vena cava. Electronically Signed   By: Bretta Bang III M.D.   On: 11/24/2018 13:45   Ct Head Wo Contrast  Result Date: 11/25/2018 CLINICAL DATA:  Sickle cell pain crisis. Headache for 1 month. EXAM: CT HEAD WITHOUT CONTRAST TECHNIQUE: Contiguous axial images were obtained from the base of the skull through the vertex without intravenous contrast. COMPARISON:  MR brain 09/07/2016. CT head 09/07/2016. 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 No acute stroke, hemorrhage, mass lesion, hydrocephalus, or extra-axial fluid. Vascular: No hyperdense vessel or unexpected calcification. Skull: Normal. Negative for fracture or focal lesion. There may be mild expansion of the diploic space due to anemia. Sinuses/Orbits: No acute findings. Other: None. IMPRESSION: Stable chronic RIGHT hemisphere infarct. No acute intracranial findings. Electronically Signed   By: Elsie Stain M.D.   On: 11/25/2018 18:33    Procedures .Critical Care Performed by: Mancel Bale, MD Authorized by: Mancel Bale, MD   Critical care provider statement:    Critical care time (minutes):  50   Critical care start time:  11/25/2018 4:30 PM   Critical care end time:  11/25/2018 10:14 PM   Critical care time was exclusive of:  Separately billable procedures and treating other patients   Critical care was necessary to treat or prevent imminent or life-threatening deterioration of the following conditions:  Circulatory failure   Critical care was time spent personally by me on the  following activities:  Blood draw for specimens, development of treatment plan with patient or surrogate, discussions with consultants, evaluation of patient's response to treatment, examination of patient, obtaining history from patient or surrogate, ordering and performing treatments and interventions, ordering and review of laboratory studies, pulse oximetry, re-evaluation of patient's condition, review of old charts and ordering and review of radiographic studies   (including critical care time)  Medications Ordered in ED Medications  dextrose 5 %-0.45 % sodium chloride infusion ( Intravenous New Bag/Given 11/25/18 2002)  HYDROmorphone (DILAUDID) injection 2 mg (has no administration in time range)    Or  HYDROmorphone (DILAUDID) injection 2 mg (has no administration in time range)  sodium chloride flush (NS) 0.9 % injection 3 mL (3 mLs Intravenous Given 11/25/18 1700)  HYDROmorphone (DILAUDID) injection 1 mg (1 mg Intravenous Given 11/25/18 1802)  oxyCODONE (Oxy IR/ROXICODONE) immediate release tablet 10 mg (10 mg Oral Given 11/25/18 1759)  promethazine (PHENERGAN) injection 12.5 mg (12.5 mg Intravenous Given 11/25/18 1802)  diphenhydrAMINE (BENADRYL) injection 12.5 mg (12.5 mg Intravenous Given 11/25/18 1800)  HYDROmorphone (DILAUDID) injection 2 mg (2 mg Intravenous Given 11/25/18 2005)    Or  HYDROmorphone (DILAUDID) injection 2 mg ( Subcutaneous See Alternative 11/25/18 2005)  HYDROmorphone (DILAUDID) injection 2 mg (2 mg Intravenous Given 11/25/18 2045)    Or  HYDROmorphone (DILAUDID) injection 2 mg ( Subcutaneous See Alternative 11/25/18 2045)  HYDROmorphone (DILAUDID) injection 2 mg (2 mg Intravenous Given 11/25/18 2117)    Or  HYDROmorphone (DILAUDID) injection 2 mg ( Subcutaneous See Alternative 11/25/18 2117)  diphenhydrAMINE (BENADRYL) injection 12.5 mg (12.5 mg Intravenous Given 11/25/18 2115)     Initial Impression / Assessment and Plan / ED Course  I have reviewed the triage vital signs and the  nursing notes.  Pertinent labs & imaging results that were available during my care of the patient were reviewed by me and considered in my medical decision making (see chart for details).  Clinical Course as of Nov 25 2148  Wed Nov 25, 2018  1913 Normal  I-Stat beta hCG blood, ED [EW]  1913 Elevated  Reticulocytes(!) [EW]  1913 Normal except hemoglobin low 7.2, platelets elevated  CBC with Differential(!) [EW]  1913 Normal except glucose high, calcium low, total bilirubin high  Comprehensive metabolic panel(!) [EW]  1914 No acute abnormalities, images reviewed by me  CT Head Wo Contrast [EW]  1914 No infiltrate or CHF, images reviewed by me  DG Chest 2 View [EW]  1916 After receiving the same medications which she was successfully treated with, yesterday, the patient, at this time states that she has had no relief of her pain.   [EW]  1942 She has not had any relief, from the pain, yet.  She appears to be in crisis with elevated reticulocyte count.  We will give additional pain control and attempt to control her pain.   [EW]  2149 After the third dose of IV Dilaudid, she is still uncomfortable stating pain is 5-6 over 10.  She does not feel like she can go home at this time.   [EW]    Clinical Course User Index [EW] Mancel Bale, MD     Patient Vitals for the past 24 hrs:  BP Temp Temp src Pulse Resp SpO2  11/25/18 2030 107/61 - - 82 10 90 %  11/25/18 2006 (!) 110/56 - - 88 18 -  11/25/18 1804 109/67 - - 86 17 98 %  11/25/18 1730 109/67 - - 78 14 98 %  11/25/18 1702 117/69 - - 84 14 98 %  11/25/18 1600 122/73 98.4 F (36.9 C) Oral 85 18 98 %    9:50 PM Reevaluation with update and discussion. After initial assessment and treatment, an updated evaluation reveals she remains uncomfortable, despite multiple doses of narcotic pain reliever.  Findings discussed and questions answered. Mancel Bale   Medical Decision Making: Patient presenting with recurrent pain, not  improved with home medications and evaluation and treatment yesterday as an outpatient at the sickle cell clinic.  Reticulocyte count elevated from baseline.  Hemoglobin has not dropped significantly, since yesterday.  She is followed closely by hematology at [facility]  No overt etiology for crisis at this time.  Doubt pneumonia, serious bacterial infection, metabolic instability or CVA.  Patient's pain could not be controlled with usual ED protocol.  Will seek admission for further care and management  CRITICAL CARE-yes Performed by: Mancel Bale   Nursing Notes Reviewed/ Care Coordinated Applicable Imaging Reviewed Interpretation of Laboratory Data incorporated into ED treatment  9:51 PM-Consult complete with hospitalist. Patient case explained and discussed.  He agrees to admit patient for further evaluation and treatment. Call ended at 10:05 PM  Plan: Admit  Final Clinical Impressions(s) / ED Diagnoses   Final diagnoses:  Sickle cell pain crisis [facility])    ED Discharge Orders    None       Mancel Bale, MD 11/25/18 2215

## 2018-11-26 ENCOUNTER — Other Ambulatory Visit: Payer: Self-pay

## 2018-11-26 DIAGNOSIS — L299 Pruritus, unspecified: Secondary | ICD-10-CM | POA: Diagnosis present

## 2018-11-26 DIAGNOSIS — Z7982 Long term (current) use of aspirin: Secondary | ICD-10-CM | POA: Diagnosis not present

## 2018-11-26 DIAGNOSIS — Z886 Allergy status to analgesic agent status: Secondary | ICD-10-CM | POA: Diagnosis not present

## 2018-11-26 DIAGNOSIS — G894 Chronic pain syndrome: Secondary | ICD-10-CM | POA: Diagnosis present

## 2018-11-26 DIAGNOSIS — D57219 Sickle-cell/Hb-C disease with crisis, unspecified: Secondary | ICD-10-CM | POA: Diagnosis not present

## 2018-11-26 DIAGNOSIS — Z79891 Long term (current) use of opiate analgesic: Secondary | ICD-10-CM | POA: Diagnosis not present

## 2018-11-26 DIAGNOSIS — Z79899 Other long term (current) drug therapy: Secondary | ICD-10-CM | POA: Diagnosis not present

## 2018-11-26 DIAGNOSIS — D571 Sickle-cell disease without crisis: Secondary | ICD-10-CM | POA: Diagnosis not present

## 2018-11-26 DIAGNOSIS — D5700 Hb-SS disease with crisis, unspecified: Secondary | ICD-10-CM | POA: Diagnosis present

## 2018-11-26 DIAGNOSIS — Z830 Family history of human immunodeficiency virus [HIV] disease: Secondary | ICD-10-CM | POA: Diagnosis not present

## 2018-11-26 DIAGNOSIS — I69354 Hemiplegia and hemiparesis following cerebral infarction affecting left non-dominant side: Secondary | ICD-10-CM | POA: Diagnosis not present

## 2018-11-26 DIAGNOSIS — Z91018 Allergy to other foods: Secondary | ICD-10-CM | POA: Diagnosis not present

## 2018-11-26 DIAGNOSIS — G40909 Epilepsy, unspecified, not intractable, without status epilepticus: Secondary | ICD-10-CM | POA: Diagnosis not present

## 2018-11-26 DIAGNOSIS — R011 Cardiac murmur, unspecified: Secondary | ICD-10-CM | POA: Diagnosis present

## 2018-11-26 DIAGNOSIS — I272 Pulmonary hypertension, unspecified: Secondary | ICD-10-CM | POA: Diagnosis present

## 2018-11-26 DIAGNOSIS — Z885 Allergy status to narcotic agent status: Secondary | ICD-10-CM | POA: Diagnosis not present

## 2018-11-26 DIAGNOSIS — R51 Headache: Secondary | ICD-10-CM | POA: Diagnosis not present

## 2018-11-26 DIAGNOSIS — R52 Pain, unspecified: Secondary | ICD-10-CM | POA: Diagnosis present

## 2018-11-26 DIAGNOSIS — Z888 Allergy status to other drugs, medicaments and biological substances status: Secondary | ICD-10-CM | POA: Diagnosis not present

## 2018-11-26 DIAGNOSIS — F1190 Opioid use, unspecified, uncomplicated: Secondary | ICD-10-CM | POA: Diagnosis not present

## 2018-11-26 DIAGNOSIS — D638 Anemia in other chronic diseases classified elsewhere: Secondary | ICD-10-CM | POA: Diagnosis present

## 2018-11-26 LAB — COMPREHENSIVE METABOLIC PANEL
ALT: 26 U/L (ref 0–44)
AST: 41 U/L (ref 15–41)
Albumin: 4.5 g/dL (ref 3.5–5.0)
Alkaline Phosphatase: 54 U/L (ref 38–126)
Anion gap: 6 (ref 5–15)
BUN: 7 mg/dL (ref 6–20)
CO2: 25 mmol/L (ref 22–32)
Calcium: 8.7 mg/dL — ABNORMAL LOW (ref 8.9–10.3)
Chloride: 108 mmol/L (ref 98–111)
Creatinine, Ser: 0.43 mg/dL — ABNORMAL LOW (ref 0.44–1.00)
GFR calc Af Amer: >60 mL/min (ref >60)
GFR calc non Af Amer: >60 mL/min (ref >60)
Glucose, Bld: 106 mg/dL — ABNORMAL HIGH (ref 70–99)
Potassium: 3.7 mmol/L (ref 3.5–5.1)
Sodium: 139 mmol/L (ref 135–145)
Total Bilirubin: 2.9 mg/dL — ABNORMAL HIGH (ref 0.3–1.2)
Total Protein: 7.7 g/dL (ref 6.5–8.1)

## 2018-11-26 LAB — CBC
HCT: 21.9 % — ABNORMAL LOW (ref 36.0–46.0)
Hemoglobin: 6.9 g/dL — CL (ref 12.0–15.0)
MCH: 29 pg (ref 26.0–34.0)
MCHC: 31.5 g/dL (ref 30.0–36.0)
MCV: 92 fL (ref 80.0–100.0)
Platelets: 411 10*3/uL — ABNORMAL HIGH (ref 150–400)
RBC: 2.38 MIL/uL — ABNORMAL LOW (ref 3.87–5.11)
RDW: 24.1 % — ABNORMAL HIGH (ref 11.5–15.5)
WBC: 12.5 10*3/uL — ABNORMAL HIGH (ref 4.0–10.5)
nRBC: 2.2 % — ABNORMAL HIGH (ref 0.0–0.2)

## 2018-11-26 LAB — URINALYSIS, COMPLETE (UACMP) WITH MICROSCOPIC
Bilirubin Urine: NEGATIVE
Glucose, UA: NEGATIVE mg/dL
Ketones, ur: NEGATIVE mg/dL
Nitrite: NEGATIVE
Protein, ur: NEGATIVE mg/dL
Specific Gravity, Urine: 1.01 (ref 1.005–1.030)
pH: 5 (ref 5.0–8.0)

## 2018-11-26 LAB — RETICULOCYTES
Immature Retic Fract: 34.7 % — ABNORMAL HIGH (ref 2.3–15.9)
RBC.: 2.38 MIL/uL — ABNORMAL LOW (ref 3.87–5.11)
Retic Count, Absolute: 444.8 10*3/uL — ABNORMAL HIGH (ref 19.0–186.0)
Retic Ct Pct: 18.7 % — ABNORMAL HIGH (ref 0.4–3.1)

## 2018-11-26 LAB — HIV ANTIBODY (ROUTINE TESTING W REFLEX): HIV Screen 4th Generation wRfx: NONREACTIVE

## 2018-11-26 LAB — LACTATE DEHYDROGENASE: LDH: 298 U/L — ABNORMAL HIGH (ref 98–192)

## 2018-11-26 MED ORDER — SODIUM CHLORIDE 0.9% FLUSH: 10.0000 mL | INTRAVENOUS | Status: DC | PRN

## 2018-11-26 MED ORDER — HYDROMORPHONE HCL 1 MG/ML IJ SOLN
2.0000 mg | INTRAMUSCULAR | Status: DC | PRN
  Administered 2018-11-26 – 2018-12-01 (×25): 2 mg via INTRAVENOUS
  Filled 2018-11-26 (×26): qty 2

## 2018-11-26 MED ORDER — ALBUTEROL SULFATE (2.5 MG/3ML) 0.083% IN NEBU: 2.5000 mg | INHALATION_SOLUTION | RESPIRATORY_TRACT | Status: DC | PRN

## 2018-11-26 MED ORDER — OXYCODONE HCL 5 MG PO TABS
10.0000 mg | ORAL_TABLET | ORAL | Status: DC | PRN
  Administered 2018-11-26 – 2018-11-29 (×5): 10 mg via ORAL
  Filled 2018-11-26 (×5): qty 2

## 2018-11-26 NOTE — Progress Notes (Addendum)
CSW met with the patient at bedside, explain csw role and reason for visit. Patient states she does not needs any resources at this time.Patient reports she is aware of the community sickle cell clinic and understands how to navigate their services.  CSW will sign off at this time.   Kathrin Greathouse, Marlinda Mike, MSW Clinical Social Worker  [phone] 11/26/2018  10:21 AM

## 2018-11-26 NOTE — H&P (Addendum)
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  Of note, she follows with the [facility] hematology team for management of her sickle cell disease, she is on a monthly packed red blood cell exchange program, most recent exchange was on October 23, 2018.  She reports in the past she has been on hydroxyurea, but this was stopped in the setting of pregnancy/pregnancy risk.  She reports taking oxycodone long-acting formulation 30 mg twice daily.  She reports at baseline she does not require her rapid acting oxycodone 10 mg for pain control.  However, over the past week she has had generalized pain, rates 10 out of 10.  This was not alleviated by her home medication regimen.  She underwent a stay in the day hospital for supportive care including IV opioids, after which she thought she was going to be on to manage at home.  However, her symptoms continued resulting in her seeking care in the medical centers emergency department.  Currently, she reports living in a motel with her husband and 4 children, all boys.  She reports being independent in her ADLs, is a non-smoker.  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  She reports associated symptoms of shortness of breath and nausea.  She denies any fevers, chills, dysuria, vomiting.  She reports she has not been hospitalized in the past year for sickle cell pain crisis.  At baseline, she walks unassisted, but does wear a brace on her left leg.   ED Course: In the emergency department heart rate within normal limits, systolic blood pressure  ranging from 105-122, O2 saturations within normal limits.  CBC remarkable for hemoglobin of 7.2, plate count 696.  Reticulocyte count 20%.  Total bilirubin of 2.2.  Chest x-ray without evidence of acute chest syndrome.  CT scan of the head revealed stable right hemispheric infarct.   The patient was given multiple doses of IV hydromorphone 2 mg as well as IV antihistamines and initiated on a D5 half-normal saline continuous infusion.  Due to uncontrolled symptoms, hospital medicine consulted for further management.  Review of Systems: A complete ROS was obtained; pertinent positives negatives are denoted in the HPI. Otherwise, all systems are negative.   Past Medical History:  Diagnosis Date  . Anemia   . Blood transfusion without reported diagnosis   . Seizures (HCC)    one time incident, may have been r/t to a pain medication she was prescribed  . Sickle cell disease (HCC)   . Stroke [facility])    Social History   Socioeconomic History  . Marital status: Married    Spouse name: Not on file  . Number of children: 4  . Years of education: Not on file  . Highest education level: Not on file  Occupational History  . Occupation: Doesn't work  Engineer, production  . Financial resource strain: Not on file  . Food insecurity:    Worry: Not on file    Inability: Not on file  . Transportation needs:    Medical: Not on file    Non-medical: Not on file  Tobacco Use  . Smoking status: Never Smoker  . Smokeless tobacco:  Never Used  Substance and Sexual Activity  . Alcohol use: No    Alcohol/week: 0.0 standard drinks    Comment: occasionally  . Drug use: No  . Sexual activity: Yes  Lifestyle  . Physical activity:    Days per week: Not on file    Minutes per session: Not on file  . Stress: Not on file  Relationships  . Social connections:    Talks on phone: Not on file    Gets together: Not on file    Attends religious service: Not on file    Active member of club or organization: Not on  file    Attends meetings of clubs or organizations: Not on file    Relationship status: Not on file  . Intimate partner violence:    Fear of current or ex partner: Not on file    Emotionally abused: Not on file    Physically abused: Not on file    Forced sexual activity: Not on file  Other Topics Concern  . Not on file  Social History Narrative  . Not on file   Family History  Problem Relation Age of Onset  . HIV/AIDS Father    Physical Exam: Vitals:   11/25/18 2006 11/25/18 2030 11/25/18 2151 11/26/18 0023  BP: (!) 110/56 107/61 (!) 105/44 114/72  Pulse: 88 82 89 90  Resp: 18 10 (!) 21   Temp:    98.7 F (37.1 C)  TempSrc:    Oral  SpO2:  90% 96% 95%   General: Young black woman, awake, sitting up on the side of the bed, scratching throughout encounter, appears tired ENT: Grossly normal hearing, MMM. Cardiovascular: RRR.  2 out of 6 holosystolic murmur. No LE edema.  Respiratory: CTA bilaterally. No wheezes or crackles. Normal respiratory effort.  Breathing room air. Abdomen: Soft, non-tender.  No guarding or rebound Skin: No rash or induration seen on limited exam.  Multiple piercings and tattoos. Musculoskeletal: Grossly normal tone BUE/BLE. Appropriate ROM.  Sits up without assistance. Psychiatric: Grossly normal mood and affect. Neurologic: Moves all extremities in coordinated fashion. IV access: right chest port accessed  I have personally reviewed the following labs, culture data, and imaging studies.  Assessment/Plan:  #Acute sickle cell vaso-occlusive pain crisis Course: this is a woman adherent to monthly RBC exchanges, baseline hb appears to be 7-8; challenging to find PRBC units that she does not have antibodies to, presenting with generalized worsening of pain x 1 week despite stay in the day hospital.   A/P:  No clear triggers.  On admission mildly elevated t bili and increased reticulocyte count (20%) all support increased hemolysis consistent with  vaso-occlusive pain episode.  Will manage supportively with pain control (continue home long acting oxycodone, provide IV hydromorphone 2 mg for moderate, 3 mg for severe pain), nausea (compazine), and pruritus management (IV diphenhydramine, patient refuses PO option), along with increase in folic acid to 5 mg; trend hemolysis labs in AM (LDH, t bili, retic count). Provide hydration to reduce sickling (continue D5 1/2 NS at 100 cc q h x 12 hrs then re-assess). I personally discussed the importance of incentive spirometry with the patient to prevent acute chest syndrome.    #Other problems: -Hx of seizure d/o: no seizures recently, continue home AED therapy -Hx of right hemispheric stroke: with reported baseline left sided hemiparesis / weakness, wears a brace to left leg to walk, continue ASA for 2/2 ppx -Iron overload: on iron chelator, continue -Pulmonary HTN: noted  in the past, may be contributor to murmur appreciated on exam  DVT prophylaxis: Subq Lovenox Code Status: full code Disposition Plan: Anticipate D/C home when pain controlled and clinically improved Consults called: none, but may benefit from social work consult as living in a motel Admission status: admit to hospital medicine, floor level of care   Laurell RoofPatrick Kuhlman, MD [facility] Page:[phone]  If 7PM-7AM, please contact night-coverage [email or web address] Password [email or web address]  This document was created using the aid of voice recognition / dication software.

## 2018-11-26 NOTE — Progress Notes (Signed)
Subjective: Debra Williamson, a 38 year old female with a medical history significant for sickle cell anemia, chronic pain syndrome, history of CVA with left hemiparesis, and pulmonarywas admitted overnight and sickle cell crisis.   Pain is primarily in joints and low back.  Patient typically takes OxyContin 30 mg every 12 hours and oxycodone at home.  She states that she has not received relief from home pain medications.  Current pain intensity is 7/10.   Reviewed notes from [facility] hematology.  Patient was last evaluated on 11/12/2018.  Patient was receiving monthly RBC exchanges.  Her last red blood cell exchange was on 10/23/2018.  Patient's hemoglobin is 6.9 on today.  Typically not transfused unless hemoglobin is less than 6.  She has a history of multiple antibodies including warm auto, therefore it is difficult to find compatible units.  Ms. [place] is afebrile and maintaining oxygen above 90% on RA. She denies headache, chest pain, abdominal pain, diarrhea, nausea, vomiting, or diarrhea.  Objective:  Vital signs in last 24 hours:  Vitals:   11/25/18 2151 11/26/18 0023 11/26/18 0100 11/26/18 0550  BP: (!) 105/44 114/72  (!) 100/57  Pulse: 89 90  93  Resp: (!) 21 16  16   Temp:  98.7 F (37.1 C)  98.3 F (36.8 C)  TempSrc:  Oral  Oral  SpO2: 96% 95%  97%  Weight:   75.7 kg   Height:   5\' 4"  (1.626 m)     Intake/Output from previous day:   Intake/Output Summary (Last 24 hours) at 11/26/2018 1019 Last data filed at 11/26/2018 0600 Gross per 24 hour  Intake 1690 ml  Output -  Net 1690 ml   Physical Exam Constitutional:      Appearance: Normal appearance. She is normal weight.  HENT:     Mouth/Throat:     Mouth: Mucous membranes are moist.  Neck:     Musculoskeletal: Normal range of motion.  Cardiovascular:     Rate and Rhythm: Normal rate.     Pulses: Normal pulses.     Heart sounds: Normal heart sounds.  Pulmonary:     Effort: Pulmonary effort is normal.     Breath sounds:  Normal breath sounds.  Abdominal:     General: Abdomen is flat. Bowel sounds are normal.  Musculoskeletal: Normal range of motion.  Skin:    General: Skin is warm and dry.  Neurological:     Mental Status: She is alert and oriented to person, place, and time. Mental status is at baseline.     Motor: Weakness present.     Gait: Gait abnormal.  Psychiatric:        Mood and Affect: Mood normal.        Behavior: Behavior normal.        Thought Content: Thought content normal.        Judgment: Judgment normal.      Lab Results:  Basic Metabolic Panel:    Component Value Date/Time   NA 139 11/26/2018 0548   NA 138 11/09/2018 1038   K 3.7 11/26/2018 0548   CL 108 11/26/2018 0548   CO2 25 11/26/2018 0548   BUN 7 11/26/2018 0548   BUN 7 11/09/2018 1038   CREATININE 0.43 (L) 11/26/2018 0548   CREATININE 0.62 02/07/2017 1005   GLUCOSE 106 (H) 11/26/2018 0548   CALCIUM 8.7 (L) 11/26/2018 0548   CBC:    Component Value Date/Time   WBC 12.5 (H) 11/26/2018 0548   HGB 6.9 (LL) 11/26/2018  0548   HGB 7.4 (L) 11/09/2018 1038   HCT 21.9 (L) 11/26/2018 0548   HCT 22.0 (L) 11/09/2018 1038   PLT 411 (H) 11/26/2018 0548   PLT 366 11/09/2018 1038   MCV 92.0 11/26/2018 0548   MCV 90 11/09/2018 1038   NEUTROABS 5.4 11/25/2018 1655   NEUTROABS 3.9 11/09/2018 1038   LYMPHSABS 2.2 11/25/2018 1655   LYMPHSABS 2.7 11/09/2018 1038   MONOABS 1.3 (H) 11/25/2018 1655   EOSABS 0.2 11/25/2018 1655   EOSABS 0.2 11/09/2018 1038   BASOSABS 0.1 11/25/2018 1655   BASOSABS 0.1 11/09/2018 1038    No results found for this or any previous visit (from the past 240 hour(s)).  Studies/Results: Dg Chest 2 View  Result Date: 11/25/2018 CLINICAL DATA:  Sickle cell crisis EXAM: CHEST - 2 VIEW COMPARISON:  November 24, 2018 FINDINGS: Port-A-Cath tip is in the superior vena cava, unchanged. No pneumothorax. No edema or consolidation. Heart is upper normal in size with pulmonary vascularity within normal  limits. No adenopathy. No evident bone lesions. IMPRESSION: No edema or consolidation. Stable cardiac silhouette. No appreciable change compared to 1 day prior. Electronically Signed   By: Bretta Bang III M.D.   On: 11/25/2018 16:18   Ct Head Wo Contrast  Result Date: 11/25/2018 CLINICAL DATA:  Sickle cell pain crisis. Headache for 1 month. EXAM: CT HEAD WITHOUT CONTRAST TECHNIQUE: Contiguous axial images were obtained from the base of the skull through the vertex without intravenous contrast. COMPARISON:  MR brain 09/07/2016. CT head 09/07/2016. 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 No acute stroke, hemorrhage, mass lesion, hydrocephalus, or extra-axial fluid. Vascular: No hyperdense vessel or unexpected calcification. Skull: Normal. Negative for fracture or focal lesion. There may be mild expansion of the diploic space due to anemia. Sinuses/Orbits: No acute findings. Other: None. IMPRESSION: Stable chronic RIGHT hemisphere infarct. No acute intracranial findings. Electronically Signed   By: Elsie Stain M.D.   On: 11/25/2018 18:33    Medications: Scheduled Meds: . aspirin EC  81 mg Oral QPM  . Deferasirox  360 mg Oral BID  . enoxaparin (LOVENOX) injection  40 mg Subcutaneous Daily  . folic acid  5 mg Oral Daily  . lamoTRIgine  200 mg Oral BID  . levETIRAcetam  1,000 mg Oral BID  . oxyCODONE  30 mg Oral BID  . sodium chloride flush  3 mL Intravenous Q12H   Continuous Infusions: . dextrose 5 % and 0.45% NaCl 100 mL/hr at 11/26/18 0311  . diphenhydrAMINE 12.5 mg (11/26/18 0407)   PRN Meds:.albuterol, bisacodyl, diphenhydrAMINE, HYDROmorphone (DILAUDID) injection, lidocaine-prilocaine, oxyCODONE, polyethylene glycol, promethazine, sodium chloride flush  Assessment/Plan:   Sickle cell anemia with pain crisis: Continue pain control with Dilaudid 2-3 mg every 4 hours as needed for severe  pain. D5.45% saline at 50 mL/h Promethazine 12.5 mg every 6 hours as needed Continue incentive spirometry Patient encouraged to ambulate in halls Discharge plan for 11/27/2018  History of seizure disorder: Stable.  Patient states that she has not had seizure activity in many years.  Continue Keppra and Lamictal per home regimen  History of iron overload: Continue deferasirox  Chronic pain syndrome: Continue OxyContin 30 mg every 12 hours Oxycodone 10 mg every 4 hours as needed for moderate to severe breakthrough pain.  History of right hemispheric stroke: Continue ASA  Code Status: Full Code Family Communication: N/A Disposition Plan: Not yet ready for discharge  Eugenie Norrie  Shelton SilvasHollis  APRN, MSN, FNP-C Patient Care [facility] [address]  [place], [place] [id number] [phone]   If 5PM-7AM, please contact night-coverage.  11/26/2018, 10:19 AM  LOS: 0 days

## 2018-11-27 ENCOUNTER — Telehealth: Payer: Self-pay

## 2018-11-27 ENCOUNTER — Other Ambulatory Visit: Payer: Self-pay | Admitting: Family Medicine

## 2018-11-27 DIAGNOSIS — R110 Nausea: Secondary | ICD-10-CM

## 2018-11-27 DIAGNOSIS — D571 Sickle-cell disease without crisis: Secondary | ICD-10-CM

## 2018-11-27 DIAGNOSIS — F1190 Opioid use, unspecified, uncomplicated: Secondary | ICD-10-CM

## 2018-11-27 DIAGNOSIS — G40909 Epilepsy, unspecified, not intractable, without status epilepticus: Secondary | ICD-10-CM

## 2018-11-27 DIAGNOSIS — G894 Chronic pain syndrome: Secondary | ICD-10-CM

## 2018-11-27 LAB — URINE CULTURE: Culture: NO GROWTH

## 2018-11-27 MED ORDER — OXYCONTIN 30 MG PO T12A: 1.0000 | EXTENDED_RELEASE_TABLET | Freq: Two times a day (BID) | ORAL | 0 refills | Status: DC

## 2018-11-27 MED ORDER — OXYCODONE HCL 10 MG PO TABS: 10.0000 mg | ORAL_TABLET | ORAL | 0 refills | Status: DC

## 2018-11-27 MED ORDER — PROMETHAZINE HCL 25 MG PO TABS: 25.0000 mg | ORAL_TABLET | Freq: Three times a day (TID) | ORAL | 2 refills | Status: DC | PRN

## 2018-11-27 MED ORDER — SODIUM CHLORIDE 0.9 % IV SOLN
INTRAVENOUS | Status: DC | PRN
  Administered 2018-11-27: 500 mL via INTRAVENOUS

## 2018-11-27 NOTE — Progress Notes (Signed)
Subjective: Debra Williamson, 38 year old female admitted and sickle cell pain crisis. Patient continues to have pain in joints and low back.  Current pain intensity is 6-7/10.  Patient states that pain intensity is not at goal.  Patient does not feel that she can manage at home at this time.  She says that she has 4 young children that she has to take care of.  Patient denies fever, cough, chills, nausea, vomiting, or diarrhea.    Objective:  Vital signs in last 24 hours:  Vitals:   11/26/18 0550 11/26/18 1357 11/26/18 2100 11/27/18 0511  BP: (!) 100/57 (!) 105/59 108/61 110/63  Pulse: 93 82 80 77  Resp: 16 14 16 16   Temp: 98.3 F (36.8 C) 98.2 F (36.8 C) 98.7 F (37.1 C) 99 F (37.2 C)  TempSrc: Oral Oral Oral Oral  SpO2: 97% 95% 98% 95%  Weight:      Height:        Intake/Output from previous day:   Intake/Output Summary (Last 24 hours) at 11/27/2018 1333 Last data filed at 11/27/2018 0953 Gross per 24 hour  Intake 1253 ml  Output 300 ml  Net 953 ml    Physical Exam: General: Alert, awake, oriented x3, in no acute distress.  HEENT: [place]/AT PEERL, EOMI Neck: Trachea midline,  no masses, no thyromegal,y no JVD, no carotid bruit OROPHARYNX:  Moist, No exudate/ erythema/lesions.  Heart: Regular rate and rhythm, without murmurs, rubs, gallops, PMI non-displaced, no heaves or thrills on palpation.  Lungs: Clear to auscultation, no wheezing or rhonchi noted. No increased vocal fremitus resonant to percussion  Abdomen: Soft, nontender, nondistended, positive bowel sounds, no masses no hepatosplenomegaly noted..  Neuro: No focal neurological deficits noted cranial nerves II through XII grossly intact. DTRs 2+ bilaterally upper and lower extremities. Strength 5 out of 5 in bilateral upper and lower extremities. Musculoskeletal: No warm swelling or erythema around joints, no spinal tenderness noted. Psychiatric: Patient alert and oriented x3, good insight and cognition, good recent to  remote recall. Lymph node survey: No cervical axillary or inguinal lymphadenopathy noted.  Lab Results:  Basic Metabolic Panel:    Component Value Date/Time   NA 139 11/26/2018 0548   NA 138 11/09/2018 1038   K 3.7 11/26/2018 0548   CL 108 11/26/2018 0548   CO2 25 11/26/2018 0548   BUN 7 11/26/2018 0548   BUN 7 11/09/2018 1038   CREATININE 0.43 (L) 11/26/2018 0548   CREATININE 0.62 02/07/2017 1005   GLUCOSE 106 (H) 11/26/2018 0548   CALCIUM 8.7 (L) 11/26/2018 0548   CBC:    Component Value Date/Time   WBC 12.5 (H) 11/26/2018 0548   HGB 6.9 (LL) 11/26/2018 0548   HGB 7.4 (L) 11/09/2018 1038   HCT 21.9 (L) 11/26/2018 0548   HCT 22.0 (L) 11/09/2018 1038   PLT 411 (H) 11/26/2018 0548   PLT 366 11/09/2018 1038   MCV 92.0 11/26/2018 0548   MCV 90 11/09/2018 1038   NEUTROABS 5.4 11/25/2018 1655   NEUTROABS 3.9 11/09/2018 1038   LYMPHSABS 2.2 11/25/2018 1655   LYMPHSABS 2.7 11/09/2018 1038   MONOABS 1.3 (H) 11/25/2018 1655   EOSABS 0.2 11/25/2018 1655   EOSABS 0.2 11/09/2018 1038   BASOSABS 0.1 11/25/2018 1655   BASOSABS 0.1 11/09/2018 1038    No results found for this or any previous visit (from the past 240 hour(s)).  Studies/Results: Dg Chest 2 View  Result Date: 11/25/2018 CLINICAL DATA:  Sickle cell crisis EXAM: CHEST - 2  VIEW COMPARISON:  November 24, 2018 FINDINGS: Port-A-Cath tip is in the superior vena cava, unchanged. No pneumothorax. No edema or consolidation. Heart is upper normal in size with pulmonary vascularity within normal limits. No adenopathy. No evident bone lesions. IMPRESSION: No edema or consolidation. Stable cardiac silhouette. No appreciable change compared to 1 day prior. Electronically Signed   By: Bretta Bang III M.D.   On: 11/25/2018 16:18   Ct Head Wo Contrast  Result Date: 11/25/2018 CLINICAL DATA:  Sickle cell pain crisis. Headache for 1 month. EXAM: CT HEAD WITHOUT CONTRAST TECHNIQUE: Contiguous axial images were obtained from the base  of the skull through the vertex without intravenous contrast. COMPARISON:  MR brain 09/07/2016. CT head 09/07/2016. 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 No acute stroke, hemorrhage, mass lesion, hydrocephalus, or extra-axial fluid. Vascular: No hyperdense vessel or unexpected calcification. Skull: Normal. Negative for fracture or focal lesion. There may be mild expansion of the diploic space due to anemia. Sinuses/Orbits: No acute findings. Other: None. IMPRESSION: Stable chronic RIGHT hemisphere infarct. No acute intracranial findings. Electronically Signed   By: Elsie Stain M.D.   On: 11/25/2018 18:33    Medications: Scheduled Meds: . aspirin EC  81 mg Oral QPM  . Deferasirox  360 mg Oral BID  . enoxaparin (LOVENOX) injection  40 mg Subcutaneous Daily  . folic acid  5 mg Oral Daily  . lamoTRIgine  200 mg Oral BID  . levETIRAcetam  1,000 mg Oral BID  . oxyCODONE  30 mg Oral BID  . sodium chloride flush  3 mL Intravenous Q12H   Continuous Infusions: . sodium chloride 500 mL (11/27/18 0012)  . diphenhydrAMINE 12.5 mg (11/27/18 1305)   PRN Meds:.sodium chloride, albuterol, bisacodyl, diphenhydrAMINE, HYDROmorphone (DILAUDID) injection, lidocaine-prilocaine, oxyCODONE, polyethylene glycol, promethazine, sodium chloride flush  Assessment/Plan: Active Problems:   Seizure disorder (HCC)   Vaso-occlusive sickle cell crisis (HCC)   Sickle cell crisis (HCC)  Sickle cell anemia with pain crisis: Continue pain control with Dilaudid 2-3 mg every 4 hours as needed for severe pain Oxycodone 10 mg every 4 hours as needed for moderate to severe breakthrough pain Continue incentive spirometer Maintain oxygen saturation above 90% Patient encouraged to ambulate in halls  History of seizure disorder: Stable.  Continue Keppra and Lamictal  Chronic pain syndrome: OxyContin 30 mg every 12  hours  History of right hemispheric stroke: Continue ASA  Code Status: Full Code Family Communication: N/A Disposition Plan: Not yet ready for discharge  Jeaneen Cala Rennis Petty  APRN, MSN, FNP-C Patient Care Center [facility] [address] [place], [place] [address] [phone]  If 5PM-7AM, please contact night-coverage.  11/27/2018, 1:33 PM  LOS: 1 day

## 2018-11-28 NOTE — Progress Notes (Signed)
Subjective: 38 year old female with history of sickle cell disease admitted with sickle cell painful crisis.  Patient still complaining of pain at 9 out of 10 in her back legs.  She usually gets the pains with every movement.  Patient is no use of much medication she is not on PCA.  Oral medications have been given.  Encouraged to use as needed Dilaudid as well.  Objective: Vital signs in last 24 hours: Temp:  [97.7 F (36.5 C)-99.7 F (37.6 C)] 99.7 F (37.6 C) (02/08 0515) Pulse Rate:  [89-98] 98 (02/08 0515) Resp:  [10-16] 16 (02/08 0515) BP: (101-104)/(53-56) 104/56 (02/08 0515) SpO2:  [93 %-95 %] 95 % (02/08 0515) Weight change:  Last BM Date: 11/26/18  Intake/Output from previous day: 02/07 0701 - 02/08 0700 In: 592.9 [P.O.:480; I.V.:112.9] Out: 1000 [Urine:1000] Intake/Output this shift: Total I/O In: 240 [P.O.:240] Out: -   General appearance: alert, cooperative, appears stated age and no distress Head: Normocephalic, without obvious abnormality, atraumatic Neck: no adenopathy, no carotid bruit, no JVD, supple, symmetrical, trachea midline and thyroid not enlarged, symmetric, no tenderness/mass/nodules Back: symmetric, no curvature. ROM normal. No CVA tenderness. Resp: clear to auscultation bilaterally Cardio: regular rate and rhythm, S1, S2 normal, no murmur, click, rub or gallop GI: soft, non-tender; bowel sounds normal; no masses,  no organomegaly Extremities: extremities normal, atraumatic, no cyanosis or edema Pulses: 2+ and symmetric Skin: Skin color, texture, turgor normal. No rashes or lesions Neurologic: Grossly normal  Lab Results: Recent Labs    11/25/18 1655 11/26/18 0548  WBC 9.1 12.5*  HGB 7.2* 6.9*  HCT 22.9* 21.9*  PLT 424* 411*   BMET Recent Labs    11/25/18 1655 11/26/18 0548  NA 138 139  K 3.8 3.7  CL 107 108  CO2 26 25  GLUCOSE 102* 106*  BUN 9 7  CREATININE 0.52 0.43*  CALCIUM 8.8* 8.7*    Studies/Results: No results  found.  Medications: I have reviewed the patient's current medications.  Assessment/Plan: #1 sickle cell painful crisis: Patient is being controlled with iv  Dilaudid and oral oxycodone.  Encouraged to take her medications.  Move around.  #2 seizure disorder: Continue Keppra and Lamictal  #3 history of stroke: No residuals.  Continue aspirin.  #4 chronic pain syndrome: Currently on OxyContin.  Continue.  #5 anemia of chronic disease: H&H appears to be at baseline.  Recheck prior to discharge.  LOS: 2 days   GARBA,LAWAL 11/28/2018, 12:30 PM

## 2018-11-28 NOTE — Plan of Care (Signed)

## 2018-11-29 NOTE — Plan of Care (Signed)
  Problem: Health Behavior/Discharge Planning: Goal: Ability to manage health-related needs will improve Outcome: Progressing   Problem: Clinical Measurements: Goal: Ability to maintain clinical measurements within normal limits will improve Outcome: Progressing Goal: Will remain free from infection Outcome: Progressing Goal: Diagnostic test results will improve Outcome: Progressing   Problem: Pain Managment: Goal: General experience of comfort will improve Outcome: Progressing   

## 2018-11-29 NOTE — Progress Notes (Signed)
Subjective: Patient has not been moving around and appears not to be requesting for the IV pain medications.  She has not gotten out of her bed except to the bathroom.  Pain is still at 6 out of 10 in her back and legs.  No fever no seizure episodes.  Objective: Vital signs in last 24 hours: Temp:  [98.6 F (37 C)-99 F (37.2 C)] 99 F (37.2 C) (02/09 1342) Pulse Rate:  [88-101] 88 (02/09 1449) Resp:  [16-20] 16 (02/09 1342) BP: (99-109)/(50-62) 103/54 (02/09 1449) SpO2:  [94 %-97 %] 97 % (02/09 1449) Weight change:  Last BM Date: 11/27/18  Intake/Output from previous day: 02/08 0701 - 02/09 0700 In: 1498.7 [P.O.:840; I.V.:458.7] Out: -  Intake/Output this shift: No intake/output data recorded.  General appearance: alert, cooperative, appears stated age and no distress Head: Normocephalic, without obvious abnormality, atraumatic Neck: no adenopathy, no carotid bruit, no JVD, supple, symmetrical, trachea midline and thyroid not enlarged, symmetric, no tenderness/mass/nodules Back: symmetric, no curvature. ROM normal. No CVA tenderness. Resp: clear to auscultation bilaterally Cardio: regular rate and rhythm, S1, S2 normal, no murmur, click, rub or gallop GI: soft, non-tender; bowel sounds normal; no masses,  no organomegaly Extremities: extremities normal, atraumatic, no cyanosis or edema Pulses: 2+ and symmetric Skin: Skin color, texture, turgor normal. No rashes or lesions Neurologic: Grossly normal  Lab Results: No results for input(s): WBC, HGB, HCT, PLT in the last 72 hours. BMET No results for input(s): NA, K, CL, CO2, GLUCOSE, BUN, CREATININE, CALCIUM in the last 72 hours.  Studies/Results: No results found.  Medications: I have reviewed the patient's current medications.  Assessment/Plan: #1 sickle cell painful crisis: Patient is to be mobilized today.  We will maintain her on current regimen.  Continue her long-acting with a short acting OxyContin. Continue as  needed Dilaudid  #2 seizure disorder: No recurrent seizures.   Continue Keppra and Lamictal  #3 history of stroke: No residuals.  Continue aspirin.  #4 chronic pain syndrome: Currently on OxyContin.  Continue.  #5 anemia of chronic disease: H&H appears to be at baseline.  Recheck in the morning and if stable will be discharged.   LOS: 3 days   Redford Behrle,LAWAL 11/29/2018, 7:04 PM

## 2018-11-30 LAB — CBC WITH DIFFERENTIAL/PLATELET
Abs Immature Granulocytes: 0.03 10*3/uL (ref 0.00–0.07)
Basophils Absolute: 0 10*3/uL (ref 0.0–0.1)
Basophils Relative: 0 %
Eosinophils Absolute: 0.3 10*3/uL (ref 0.0–0.5)
Eosinophils Relative: 3 %
HCT: 23.3 % — ABNORMAL LOW (ref 36.0–46.0)
Hemoglobin: 7 g/dL — ABNORMAL LOW (ref 12.0–15.0)
Immature Granulocytes: 0 %
Lymphocytes Relative: 22 %
Lymphs Abs: 2.4 10*3/uL (ref 0.7–4.0)
MCH: 28.3 pg (ref 26.0–34.0)
MCHC: 30 g/dL (ref 30.0–36.0)
MCV: 94.3 fL (ref 80.0–100.0)
Monocytes Absolute: 1.7 10*3/uL — ABNORMAL HIGH (ref 0.1–1.0)
Monocytes Relative: 15 %
Neutro Abs: 6.6 10*3/uL (ref 1.7–7.7)
Neutrophils Relative %: 60 %
Platelets: 491 10*3/uL — ABNORMAL HIGH (ref 150–400)
RBC: 2.47 MIL/uL — ABNORMAL LOW (ref 3.87–5.11)
RDW: 24.3 % — ABNORMAL HIGH (ref 11.5–15.5)
WBC: 11.1 10*3/uL — ABNORMAL HIGH (ref 4.0–10.5)
nRBC: 2 % — ABNORMAL HIGH (ref 0.0–0.2)

## 2018-11-30 LAB — COMPREHENSIVE METABOLIC PANEL
ALT: 25 U/L (ref 0–44)
AST: 33 U/L (ref 15–41)
Albumin: 4.4 g/dL (ref 3.5–5.0)
Alkaline Phosphatase: 64 U/L (ref 38–126)
Anion gap: 10 (ref 5–15)
BUN: 10 mg/dL (ref 6–20)
CO2: 24 mmol/L (ref 22–32)
Calcium: 8.9 mg/dL (ref 8.9–10.3)
Chloride: 103 mmol/L (ref 98–111)
Creatinine, Ser: 0.67 mg/dL (ref 0.44–1.00)
GFR calc Af Amer: >60 mL/min (ref >60)
GFR calc non Af Amer: >60 mL/min (ref >60)
Glucose, Bld: 88 mg/dL (ref 70–99)
Potassium: 4.2 mmol/L (ref 3.5–5.1)
Sodium: 137 mmol/L (ref 135–145)
Total Bilirubin: 2.3 mg/dL — ABNORMAL HIGH (ref 0.3–1.2)
Total Protein: 7.7 g/dL (ref 6.5–8.1)

## 2018-11-30 MED ORDER — OXYCODONE HCL 5 MG PO TABS
15.0000 mg | ORAL_TABLET | ORAL | Status: DC
  Administered 2018-11-30 – 2018-12-01 (×5): 15 mg via ORAL
  Filled 2018-11-30 (×6): qty 3

## 2018-11-30 MED FILL — OxyCONTIN 30 MG T12A: 30 | 30 days supply | Qty: 60 | Fill #0

## 2018-11-30 MED FILL — PROMETHAZINE 25 MG TABLET: 25 | 10 days supply | Qty: 30 | Fill #0

## 2018-11-30 NOTE — Progress Notes (Signed)
Subjective: Debra Williamson, a 38 year old female with a history significant for sickle cell anemia, seizure disorder, history of stroke, and chronic pain syndrome was admitted and sickle cell pain crisis.  Patient attempted to ambulate in halls and states that pain has increased to lower extremities.  She currently rates pain at 7/10.  She states that she cannot manage at home.  Patient denies headache, chest pain, dysuria, abdominal pain, nausea, vomiting, or diarrhea.   Objective:  Vital signs in last 24 hours:  Vitals:   11/29/18 0614 11/29/18 1342 11/29/18 1449 11/29/18 2118  BP: 109/62 (!) 99/50 (!) 103/54 111/65  Pulse: (!) 101 90 88 90  Resp: 20 16  16   Temp: 98.6 F (37 C) 99 F (37.2 C)  98.6 F (37 C)  TempSrc: Oral Oral    SpO2: 97% 94% 97% 99%  Weight:      Height:        Intake/Output from previous day:   Intake/Output Summary (Last 24 hours) at 11/30/2018 1019 Last data filed at 11/30/2018 0900 Gross per 24 hour  Intake 501.03 ml  Output -  Net 501.03 ml    Physical Exam: General: Alert, awake, oriented x3, in no acute distress.  HEENT: [place]/AT PEERL, EOMI Neck: Trachea midline,  no masses, no thyromegal,y no JVD, no carotid bruit OROPHARYNX:  Moist, No exudate/ erythema/lesions.  Heart: Regular rate and rhythm, without murmurs, rubs, gallops, PMI non-displaced, no heaves or thrills on palpation.  Lungs: Clear to auscultation, no wheezing or rhonchi noted. No increased vocal fremitus resonant to percussion  Abdomen: Soft, nontender, nondistended, positive bowel sounds, no masses no hepatosplenomegaly noted..  Neuro: No focal neurological deficits noted cranial nerves II through XII grossly intact. DTRs 2+ bilaterally upper and lower extremities. Strength 5 out of 5 in bilateral upper and lower extremities. Musculoskeletal: No warm swelling or erythema around joints, no spinal tenderness noted. Psychiatric: Patient alert and oriented x3, good insight and  cognition, good recent to remote recall. Lymph node survey: No cervical axillary or inguinal lymphadenopathy noted.  Lab Results:  Basic Metabolic Panel:    Component Value Date/Time   NA 137 11/30/2018 0310   NA 138 11/09/2018 1038   K 4.2 11/30/2018 0310   CL 103 11/30/2018 0310   CO2 24 11/30/2018 0310   BUN 10 11/30/2018 0310   BUN 7 11/09/2018 1038   CREATININE 0.67 11/30/2018 0310   CREATININE 0.62 02/07/2017 1005   GLUCOSE 88 11/30/2018 0310   CALCIUM 8.9 11/30/2018 0310   CBC:    Component Value Date/Time   WBC 11.1 (H) 11/30/2018 0310   HGB 7.0 (L) 11/30/2018 0310   HGB 7.4 (L) 11/09/2018 1038   HCT 23.3 (L) 11/30/2018 0310   HCT 22.0 (L) 11/09/2018 1038   PLT 491 (H) 11/30/2018 0310   PLT 366 11/09/2018 1038   MCV 94.3 11/30/2018 0310   MCV 90 11/09/2018 1038   NEUTROABS 6.6 11/30/2018 0310   NEUTROABS 3.9 11/09/2018 1038   LYMPHSABS 2.4 11/30/2018 0310   LYMPHSABS 2.7 11/09/2018 1038   MONOABS 1.7 (H) 11/30/2018 0310   EOSABS 0.3 11/30/2018 0310   EOSABS 0.2 11/09/2018 1038   BASOSABS 0.0 11/30/2018 0310   BASOSABS 0.1 11/09/2018 1038    Recent Results (from the past 240 hour(s))  Urine Culture     Status: None   Collection Time: 11/26/18 12:56 PM  Result Value Ref Range Status   Specimen Description   Final    URINE, RANDOM Performed at  [facility], [address] [place], [place] [address]    Special Requests   Final    NONE Performed at [facility], [address] [place], [place] [address]    Culture   Final    NO GROWTH Performed at [facility], [address] [place], [place] [address]    Report Status 11/27/2018 FINAL  Final    Studies/Results: No results found.  Medications: Scheduled Meds: . aspirin EC  81 mg Oral QPM  . Deferasirox  360 mg Oral BID  . enoxaparin (LOVENOX) injection  40 mg Subcutaneous Daily  . folic acid  5 mg Oral Daily  . lamoTRIgine  200 mg Oral BID  .  levETIRAcetam  1,000 mg Oral BID  . oxyCODONE  15 mg Oral Q4H while awake  . oxyCODONE  30 mg Oral BID  . sodium chloride flush  3 mL Intravenous Q12H   Continuous Infusions: . sodium chloride 10 mL/hr at 11/28/18 0200  . diphenhydrAMINE 12.5 mg (11/29/18 1536)   PRN Meds:.sodium chloride, albuterol, bisacodyl, diphenhydrAMINE, HYDROmorphone (DILAUDID) injection, lidocaine-prilocaine, polyethylene glycol, promethazine, sodium chloride flush  Assessment/Plan: Active Problems:   Seizure disorder (HCC)   Vaso-occlusive sickle cell crisis (HCC)   Sickle cell crisis (HCC)  Sickle cell anemia with pain crisis: Schedule oxycodone 15 mg every 4 hours Dilaudid 2-3 mg every 2 hours as needed for moderate to severe breakthrough pain Discharge plan for 12/01/2018  Seizure disorders: Stable.  Continue Keppra and Lamictal  History of stroke: Continue ASA. Chronic pain syndrome: Continue OxyContin 30 mg every 12 hours  Anemia of chronic disease: Hemoglobin has improved today.  No transfusion warranted, patient difficult to transfuse due to history of antibodies.  We will continue to monitor.   Code Status: Full Code Family Communication: N/A Disposition Plan: Not yet ready for discharge    Rennis PettyMoore   APRN, MSN, FNP-C [facility] [address]  [place], [place] [address] [phone]  If 5PM-7AM, please contact night-coverage.  11/30/2018, 10:19 AM  LOS: 4 days

## 2018-11-30 NOTE — Telephone Encounter (Signed)
Patient notified

## 2018-11-30 NOTE — Plan of Care (Signed)
  Problem: Pain Managment: Goal: General experience of comfort will improve Outcome: Progressing   Problem: Activity: Goal: Risk for activity intolerance will decrease Outcome: Progressing   Problem: Education: Goal: Knowledge of General Education information will improve Description: Including pain rating scale, medication(s)/side effects and non-pharmacologic comfort measures Outcome: Progressing   

## 2018-12-01 ENCOUNTER — Telehealth: Payer: Self-pay

## 2018-12-01 DIAGNOSIS — F1190 Opioid use, unspecified, uncomplicated: Secondary | ICD-10-CM

## 2018-12-01 DIAGNOSIS — G894 Chronic pain syndrome: Secondary | ICD-10-CM

## 2018-12-01 DIAGNOSIS — D571 Sickle-cell disease without crisis: Secondary | ICD-10-CM

## 2018-12-01 MED ORDER — HEPARIN SOD (PORK) LOCK FLUSH 100 UNIT/ML IV SOLN
500.0000 [IU] | INTRAVENOUS | Status: AC | PRN
  Administered 2018-12-01: 500 [IU]

## 2018-12-01 NOTE — Care Management Note (Signed)
Case Management Note  Patient Details  Name: [name] MRN: [id number] Date of Birth: 30-Jan-1981  Subjective/Objective:                  discharged  Action/Plan: Discharged to home with self-care, orders checked for hhc needs. No CM needs present at time of discharge.  Patient is able to arrangement own appointments and home care.  Expected Discharge Date:  12/01/18               Expected Discharge Plan:  Home/Self Care  In-House Referral:  NA  Discharge planning Services  CM Consult  Post Acute Care Choice:  NA Choice offered to:  NA  DME Arranged:    DME Agency:     HH Arranged:    HH Agency:     Status of Service:  Completed, signed off  If discussed at [employer] of Stay Meetings, dates discussed:    Additional Comments:  Golda Acre, RN 12/01/2018, 10:53 AM

## 2018-12-01 NOTE — Discharge Instructions (Signed)
Sickle Cell Anemia, Adult °Sickle cell anemia is a condition where your red blood cells are shaped like sickles. Red blood cells carry oxygen through the body. Sickle-shaped cells do not live as long as normal red blood cells. They also clump together and block blood from flowing through the blood vessels. This prevents the body from getting enough oxygen. Sickle cell anemia causes organ damage and pain. It also increases the risk of infection. °Follow these instructions at home: °Medicines °· Take over-the-counter and prescription medicines only as told by your doctor. °· If you were prescribed an antibiotic medicine, take it as told by your doctor. Do not stop taking the antibiotic even if you start to feel better. °· If you develop a fever, do not take medicines to lower the fever right away. Tell your doctor about the fever. °Managing pain, stiffness, and swelling °· Try these methods to help with pain: °? Use a heating pad. °? Take a warm bath. °? Distract yourself, such as by watching TV. °Eating and drinking °· Drink enough fluid to keep your pee (urine) clear or pale yellow. Drink more in hot weather and during exercise. °· Limit or avoid alcohol. °· Eat a healthy diet. Eat plenty of fruits, vegetables, whole grains, and lean protein. °· Take vitamins and supplements as told by your doctor. °Traveling °· When traveling, keep these with you: °? Your medical information. °? The names of your doctors. °? Your medicines. °· If you need to take an airplane, talk to your doctor first. °Activity °· Rest often. °· Avoid exercises that make your heart beat much faster, such as jogging. °General instructions °· Do not use products that have nicotine or tobacco, such as cigarettes and e-cigarettes. If you need help quitting, ask your doctor. °· Consider wearing a medical alert bracelet. °· Avoid being in high places (high altitudes), such as mountains. °· Avoid very hot or cold temperatures. °· Avoid places where the  temperature changes a lot. °· Keep all follow-up visits as told by your doctor. This is important. °Contact a doctor if: °· A joint hurts. °· Your feet or hands hurt or swell. °· You feel tired (fatigued). °Get help right away if: °· You have symptoms of infection. These include: °? Fever. °? Chills. °? Being very tired. °? Irritability. °? Poor eating. °? Throwing up (vomiting). °· You feel dizzy or faint. °· You have new stomach pain, especially on the left side. °· You have a an erection (priapism) that lasts more than 4 hours. °· You have numbness in your arms or legs. °· You have a hard time moving your arms or legs. °· You have trouble talking. °· You have pain that does not go away when you take medicine. °· You are short of breath. °· You are breathing fast. °· You have a long-term cough. °· You have pain in your chest. °· You have a bad headache. °· You have a stiff neck. °· Your stomach looks bloated even though you did not eat much. °· Your skin is pale. °· You suddenly cannot see well. °Summary °· Sickle cell anemia is a condition where your red blood cells are shaped like sickles. °· Follow your doctor's advice on ways to manage pain, food to eat, activities to do, and steps to take for safe travel. °· Get medical help right away if you have any signs of infection, such as a fever. °This information is not intended to replace advice given to you by your   health care provider. Make sure you discuss any questions you have with your health care provider. °Document Released: 07/28/2013 Document Revised: 11/12/2016 Document Reviewed: 11/12/2016 °Elsevier Interactive Patient Education © 2019 [employer] ° °

## 2018-12-01 NOTE — Telephone Encounter (Signed)
Authorization has been approved and sent to pharmacy

## 2018-12-01 NOTE — Telephone Encounter (Signed)
PA resubmitted for HD

## 2018-12-01 NOTE — Telephone Encounter (Signed)
Patient notified

## 2018-12-01 NOTE — Discharge Summary (Signed)
Physician Discharge Summary  Debra Williamson ZOX:[id number] DOB: May 13, 1981 DOA: 11/25/2018  PCP: Kallie LocksStroud, Natalie M, FNP  Admit date: 11/25/2018  Discharge date: 12/01/2018  Discharge Diagnoses:  Active Problems:   Seizure disorder (HCC)   Vaso-occlusive sickle cell crisis (HCC)   Sickle cell crisis [facility](HCC)   Discharge Condition: Stable  Disposition:  Follow-up Information    Kallie LocksStroud, Natalie M, FNP Follow up.   Specialty:  Family Medicine Why:  Lab appointment in 1 week.  Contact information: [address] [place] [address] [phone]          Pt is discharged home in good condition and is to follow up with Kallie LocksStroud, Natalie M, FNP this week to have labs evaluated. Debra Williamson is instructed to increase activity slowly and balance with rest for the next few days, and use prescribed medication to complete treatment of pain  Diet: Regular Wt Readings from Last 3 Encounters:  11/26/18 75.7 kg  11/24/18 75.2 kg  11/09/18 75.5 kg    History of present illness:  Debra DellShanequa Menger, a 38 year old female with a medical history significant for sickle cell anemia, chronic pain syndrome on opiate therapy, previous stroke with resultant left-sided hemiparesis, history of pulmonary hypertension, history of seizure disorder on antiepileptics, presented to emergency department complaining of generalized pain.  Patient was treated and evaluated and sickle cell day infusion center on 11/25/2018 for this problem without relief.  Of note, [facility] for management of sickle cell disease and is on monthly RBC exchange program.  Most recent exchange was on October 23, 2018.  Patient is currently not on hydroxyurea due to recent pregnancy.  Patient reports taking oxycodone long-acting 30 mg every 12 hours and at baseline patient does not require short acting oxycodone for pain control.  However over the past week patient has had generalized pain rated 10/10.   Patient's pain was not alleviated by home medication regimen.  Following day infusion center admission, patient's pain improved.  She thought she would be able to manage at home.  However, her symptoms continue resulting her seeking care a.m. emergency department.  Patient reports living in a motel with her husband and 4 children. Patient reports symptoms of shortness of breath and nausea.  She denies fevers, chills, dysuria, vomiting.  Patient states that it is been greater than a year since last sickle cell pain crisis.  ER course in emergency department, vital signs within normal limits.  Patient maintaining oxygen saturation above 90% on RA.  CBC remarkable for hemoglobin of 7.2, which is consistent with patient's baseline.  Platelet count 424.  Reticulocyte count 20%, which is robust.  Total bilirubin of 2.2.  Chest x-ray is without evidence of acute chest syndrome.  CT scan of head revealed stable right hemispheric infarct.  Patient was given multiple doses of IV hydromorphone as well as IV antihistamines.  Initiated IV fluids.  Due to uncontrolled symptoms, patient admitted for further pain management.  Hospital Course:  Sickle cell anemia with pain crisis: Patient was admitted and sickle cell pain crisis.  Patient's pain was difficult to control on current medication regimen.  Patient refused Dilaudid PCA throughout admission.  Pain was managed with IV Dilaudid 2-3 mg every 2 hours as needed.  Patient was transitioned to oxycodone 15 mg every 4 hours while awake.  Patient has a history of chronic pain and was also continued on OxyContin 30 mg every 12 hours. Patient was treated with IV fluids as well as other  adjunct therapies per sickle cell pain management protocol. Pain intensity decreased to 5-6/10.  Patient also has a history of anemia of chronic disease.  Hemoglobin remained consistent with baseline throughout admission.  Prior to discharge, hemoglobin 7.0.  No transfusion warranted during  admission.  Patient typically not transfused unless hemoglobin is less than 6.  Patient is followed by [facility] hematology for monthly red blood cell exchange.  Patient is difficult to transfuse due to multiple antibodies, including a warm auto antibody.  Patient is aware of all scheduled follow-up appointments.  She will follow-up in primary care in 1 week to repeat CBC.  Patient has a history of seizure disorder.  Continued home antiepileptic medications throughout admission.  Patient states that is been greater than 1 year since last seizure.  Patient also has a history of a right hemispheric stroke.  Patient continues to have left-sided weakness and wears a brace to left leg.  Patient continued on ASA therapy. She can Maryfrances Bunnell MI had a total Patient was discharged home today in a hemodynamically stable condition.   Discharge Exam: Vitals:   11/30/18 1409 11/30/18 2118  BP: 108/67 (!) 106/59  Pulse: 90 85  Resp: 14 16  Temp: 98.8 F (37.1 C) 98.7 F (37.1 C)  SpO2: 99% 98%   Vitals:   11/30/18 1322 11/30/18 1359 11/30/18 1409 11/30/18 2118  BP: (!) 92/53  108/67 (!) 106/59  Pulse: 92  90 85  Resp: 12 16 14 16   Temp: 97.8 F (36.6 C)  98.8 F (37.1 C) 98.7 F (37.1 C)  TempSrc: Oral  Oral Oral  SpO2: 100%  99% 98%  Weight:      Height:       Physical Exam Constitutional:      General: She is not in acute distress.    Appearance: Normal appearance.  Neck:     Musculoskeletal: Normal range of motion.  Cardiovascular:     Rate and Rhythm: Normal rate and regular rhythm.     Pulses: Normal pulses.     Heart sounds: Murmur present.  Pulmonary:     Effort: Pulmonary effort is normal.     Breath sounds: Normal breath sounds.  Abdominal:     General: Abdomen is flat. Bowel sounds are normal.  Skin:    General: Skin is warm and dry.  Neurological:     General: No focal deficit present.     Mental Status: She is alert and oriented to person, place, and time.     Motor: Weakness  present.     Gait: Gait abnormal.  Psychiatric:        Mood and Affect: Mood normal.        Behavior: Behavior normal.        Thought Content: Thought content normal.        Judgment: Judgment normal.    Discharge Instructions  Discharge Instructions    Discharge patient   Complete by:  As directed    Discharge disposition:  01-Home or Self Care   Discharge patient date:  12/01/2018     Allergies as of 12/01/2018      Reactions   Ibuprofen Hives   Zofran [ondansetron Hcl] Hives, Itching   Demerol [meperidine] Other (See Comments)   Pt has seizures   Fentanyl And Related Nausea And Vomiting, Other (See Comments)   Very sedated  **Not allergic to the injection** JUST ALLERGIC TO PATCH   Other Itching   Greek Yogurt   Codeine Hives, Itching  Hydrocodone Hives, Itching      Medication List    TAKE these medications   albuterol 108 (90 Base) MCG/ACT inhaler Commonly known as:  PROVENTIL HFA;VENTOLIN HFA Inhale 2 puffs into the lungs every 4 (four) hours as needed for wheezing or shortness of breath (cough, shortness of breath or wheezing.).   aspirin EC 81 MG tablet Take 1 tablet by mouth every evening.   diclofenac sodium 1 % Gel Commonly known as:  VOLTAREN Apply 2 g topically 4 (four) times daily.   folic acid 1 MG tablet Commonly known as:  FOLVITE Take 1 mg by mouth daily.   hydrOXYzine 25 MG capsule Commonly known as:  VISTARIL TAKE 1 CAPSULE BY MOUTH EVERY 6 HOURS AS NEEDED   hydrOXYzine 25 MG tablet Commonly known as:  ATARAX/VISTARIL Take 0.5-1 tablets (12.5-25 mg total) by mouth every 8 (eight) hours as needed for itching.   JADENU 360 MG Tabs Generic drug:  Deferasirox Take 360 mg by mouth 2 (two) times daily.   lamoTRIgine 200 MG tablet Commonly known as:  LAMICTAL TAKE 1 TABLET (200 MG TOTAL) BY MOUTH 2 TIMES DAILY.   levETIRAcetam 1000 MG tablet Commonly known as:  KEPPRA TAKE 1 TABLET BY MOUTH 2 TIMES DAILY.   lidocaine-prilocaine  cream Commonly known as:  EMLA Apply 1 application topically as needed.   meloxicam 15 MG tablet Commonly known as:  MOBIC Take 1 tablet (15 mg total) by mouth daily.   omeprazole 40 MG capsule Commonly known as:  PRILOSEC Take 1 capsule (40 mg total) by mouth daily.   Oxycodone HCl 10 MG Tabs Take 1 tablet (10 mg total) by mouth every 4 (four) hours for 15 days. What changed:  You were already taking a medication with the same name, and this prescription was added. Make sure you understand how and when to take each.   OXYCONTIN 30 MG 12 hr tablet Generic drug:  oxyCODONE Take 1 tablet (30 mg total) by mouth 2 (two) times daily for 30 days. What changed:  Another medication with the same name was added. Make sure you understand how and when to take each.   promethazine 25 MG tablet Commonly known as:  PHENERGAN Take 1 tablet (25 mg total) by mouth every 8 (eight) hours as needed for nausea or vomiting. 1 tablet every 8 hours prn   zolpidem 5 MG tablet Commonly known as:  AMBIEN Take 1 tablet (5 mg total) by mouth at bedtime as needed. sleep       The results of significant diagnostics from this hospitalization (including imaging, microbiology, ancillary and laboratory) are listed below for reference.    Significant Diagnostic Studies: Dg Chest 2 View  Result Date: 11/25/2018 CLINICAL DATA:  Sickle cell crisis EXAM: CHEST - 2 VIEW COMPARISON:  November 24, 2018 FINDINGS: Port-A-Cath tip is in the superior vena cava, unchanged. No pneumothorax. No edema or consolidation. Heart is upper normal in size with pulmonary vascularity within normal limits. No adenopathy. No evident bone lesions. IMPRESSION: No edema or consolidation. Stable cardiac silhouette. No appreciable change compared to 1 day prior. Electronically Signed   By: Bretta Bang III M.D.   On: 11/25/2018 16:18   Dg Chest 2 View  Result Date: 11/24/2018 CLINICAL DATA:  Sickle cell disease EXAM: CHEST - 2 VIEW  COMPARISON:  July 22, 2017 FINDINGS: Port-A-Cath tip is in the superior vena cava. No pneumothorax. There is no appreciable edema or consolidation. Heart is upper normal in size with  pulmonary vascularity normal. No adenopathy. No bone lesions appreciable. IMPRESSION: Heart upper normal in size. Pulmonary vascularity normal. No adenopathy. No edema or consolidation. Port-A-Cath tip in superior vena cava. Electronically Signed   By: Bretta BangWilliam  Woodruff III M.D.   On: 11/24/2018 13:45   Ct Head Wo Contrast  Result Date: 11/25/2018 CLINICAL DATA:  Sickle cell pain crisis. Headache for 1 month. EXAM: CT HEAD WITHOUT CONTRAST TECHNIQUE: Contiguous axial images were obtained from the base of the skull through the vertex without intravenous contrast. COMPARISON:  MR brain 09/07/2016. CT head 09/07/2016. 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 No acute stroke, hemorrhage, mass lesion, hydrocephalus, or extra-axial fluid. Vascular: No hyperdense vessel or unexpected calcification. Skull: Normal. Negative for fracture or focal lesion. There may be mild expansion of the diploic space due to anemia. Sinuses/Orbits: No acute findings. Other: None. IMPRESSION: Stable chronic RIGHT hemisphere infarct. No acute intracranial findings. Electronically Signed   By: Elsie StainJohn T Curnes M.D.   On: 11/25/2018 18:33    Microbiology: Recent Results (from the past 240 hour(s))  Urine Culture     Status: None   Collection Time: 11/26/18 12:56 PM  Result Value Ref Range Status   Specimen Description   Final    URINE, RANDOM Performed at [facility], [address] [place], [place] [address]    Special Requests   Final    NONE Performed at [facility], [address] [place], [place] [address]    Culture   Final    NO GROWTH Performed at [facility], [address] [place], [place]  [address]    Report Status 11/27/2018 FINAL  Final     Labs: Basic Metabolic Panel: Recent Labs  Lab 11/25/18 1655 11/26/18 0548 11/30/18 0310  NA 138 139 137  K 3.8 3.7 4.2  CL 107 108 103  CO2 26 25 24   GLUCOSE 102* 106* 88  BUN 9 7 10   CREATININE 0.52 0.43* 0.67  CALCIUM 8.8* 8.7* 8.9   Liver Function Tests: Recent Labs  Lab 11/25/18 1655 11/26/18 0548 11/30/18 0310  AST 39 41 33  ALT 25 26 25   ALKPHOS 53 54 64  BILITOT 2.2* 2.9* 2.3*  PROT 7.3 7.7 7.7  ALBUMIN 4.2 4.5 4.4   No results for input(s): LIPASE, AMYLASE in the last 168 hours. No results for input(s): AMMONIA in the last 168 hours. CBC: Recent Labs  Lab 11/25/18 1655 11/26/18 0548 11/30/18 0310  WBC 9.1 12.5* 11.1*  NEUTROABS 5.4  --  6.6  HGB 7.2* 6.9* 7.0*  HCT 22.9* 21.9* 23.3*  MCV 93.5 92.0 94.3  PLT 424* 411* 491*   Cardiac Enzymes: No results for input(s): CKTOTAL, CKMB, CKMBINDEX, TROPONINI in the last 168 hours. BNP: Invalid input(s): POCBNP CBG: No results for input(s): GLUCAP in the last 168 hours.  Time coordinating discharge: 50 minutes  Signed:  Nolon NationsLachina Moore Juanmiguel Defelice  APRN, MSN, FNP-C Patient Care [facility] [address]  [place], [place] [address] [phone]  [facility] 12/01/2018, 3:02 PM

## 2018-12-02 MED FILL — oxyCODONE HCL 10 MG TABS: 10 | 15 days supply | Qty: 90 | Fill #0

## 2018-12-02 NOTE — Telephone Encounter (Signed)
Pharmacy is getting medication ready and will contact patient
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
## 2018-12-07 ENCOUNTER — Other Ambulatory Visit: Payer: Self-pay | Admitting: Family Medicine

## 2018-12-07 DIAGNOSIS — G4700 Insomnia, unspecified: Secondary | ICD-10-CM

## 2018-12-08 ENCOUNTER — Other Ambulatory Visit: Payer: Self-pay | Admitting: Family Medicine

## 2018-12-08 ENCOUNTER — Ambulatory Visit (HOSPITAL_COMMUNITY)
Admission: RE | Admit: 2018-12-08 | Discharge: 2018-12-08 | Disposition: A | Discharge status: Home / Self Care | Payer: Medicaid Other | Source: Ambulatory Visit | Attending: Family Medicine | Admitting: Family Medicine

## 2018-12-08 ENCOUNTER — Telehealth: Payer: Self-pay

## 2018-12-08 DIAGNOSIS — G4700 Insomnia, unspecified: Secondary | ICD-10-CM

## 2018-12-08 DIAGNOSIS — D57219 Sickle-cell/Hb-C disease with crisis, unspecified: Secondary | ICD-10-CM | POA: Insufficient documentation

## 2018-12-08 LAB — CBC
HCT: 24.4 % — ABNORMAL LOW (ref 36.0–46.0)
Hemoglobin: 7.7 g/dL — ABNORMAL LOW (ref 12.0–15.0)
MCH: 28 pg (ref 26.0–34.0)
MCHC: 31.6 g/dL (ref 30.0–36.0)
MCV: 88.7 fL (ref 80.0–100.0)
Platelets: 811 10*3/uL — ABNORMAL HIGH (ref 150–400)
RBC: 2.75 MIL/uL — ABNORMAL LOW (ref 3.87–5.11)
RDW: 22.5 % — ABNORMAL HIGH (ref 11.5–15.5)
WBC: 9.3 10*3/uL (ref 4.0–10.5)
nRBC: 0.4 % — ABNORMAL HIGH (ref 0.0–0.2)

## 2018-12-08 MED ORDER — ZOLPIDEM TARTRATE 5 MG PO TABS: 5.0000 mg | ORAL_TABLET | Freq: Every evening | ORAL | 0 refills | Status: DC | PRN

## 2018-12-08 MED FILL — ZOLPIDEM TARTRATE 5 MG TAB: 5 | 30 days supply | Qty: 30 | Fill #0

## 2018-12-08 NOTE — Progress Notes (Signed)
Patient's CBC drawn. Patient tolerated lab draw well. Vital sign stable. Patient alert, oriented and ambulatory at discharge.

## 2018-12-08 NOTE — Telephone Encounter (Signed)
Patient needs a refill on Ambien.

## 2018-12-08 NOTE — Telephone Encounter (Signed)
Patient notified

## 2018-12-14 DIAGNOSIS — D571 Sickle-cell disease without crisis: Secondary | ICD-10-CM | POA: Diagnosis not present

## 2018-12-16 ENCOUNTER — Other Ambulatory Visit: Payer: Self-pay | Admitting: Family Medicine

## 2018-12-16 DIAGNOSIS — Z95828 Presence of other vascular implants and grafts: Secondary | ICD-10-CM

## 2018-12-21 ENCOUNTER — Ambulatory Visit: Payer: Medicaid Other | Admitting: Family Medicine

## 2018-12-22 ENCOUNTER — Encounter: Payer: Self-pay | Admitting: Family Medicine

## 2018-12-22 ENCOUNTER — Ambulatory Visit (INDEPENDENT_AMBULATORY_CARE_PROVIDER_SITE_OTHER): Payer: Medicaid Other | Admitting: Family Medicine

## 2018-12-22 ENCOUNTER — Other Ambulatory Visit: Payer: Self-pay | Admitting: Family Medicine

## 2018-12-22 VITALS — BP 118/60 | HR 78 | Temp 98.0°F | Ht 64.0 in | Wt 158.6 lb

## 2018-12-22 DIAGNOSIS — N390 Urinary tract infection, site not specified: Secondary | ICD-10-CM

## 2018-12-22 DIAGNOSIS — Z09 Encounter for follow-up examination after completed treatment for conditions other than malignant neoplasm: Secondary | ICD-10-CM | POA: Diagnosis not present

## 2018-12-22 DIAGNOSIS — G894 Chronic pain syndrome: Secondary | ICD-10-CM

## 2018-12-22 DIAGNOSIS — R319 Hematuria, unspecified: Secondary | ICD-10-CM | POA: Diagnosis not present

## 2018-12-22 DIAGNOSIS — F1190 Opioid use, unspecified, uncomplicated: Secondary | ICD-10-CM

## 2018-12-22 DIAGNOSIS — N898 Other specified noninflammatory disorders of vagina: Secondary | ICD-10-CM

## 2018-12-22 DIAGNOSIS — D571 Sickle-cell disease without crisis: Secondary | ICD-10-CM

## 2018-12-22 LAB — POCT URINALYSIS DIP (MANUAL ENTRY)
Bilirubin, UA: NEGATIVE
Blood, UA: NEGATIVE
Glucose, UA: NEGATIVE mg/dL
Ketones, POC UA: NEGATIVE mg/dL
Nitrite, UA: NEGATIVE
Protein Ur, POC: NEGATIVE mg/dL
Spec Grav, UA: 1.015 (ref 1.010–1.025)
Urobilinogen, UA: 2 E.U./dL — AB
pH, UA: 5.5 (ref 5.0–8.0)

## 2018-12-22 MED ORDER — DEFERASIROX 360 MG PO TABS: 360.0000 mg | ORAL_TABLET | Freq: Two times a day (BID) | ORAL | 3 refills | Status: DC

## 2018-12-22 MED ORDER — SULFAMETHOXAZOLE-TRIMETHOPRIM 800-160 MG PO TABS: 1.0000 | ORAL_TABLET | Freq: Two times a day (BID) | ORAL | 0 refills | Status: AC

## 2018-12-22 MED FILL — SULFAMETHOXAZOLE-TMP DS TAB: 800-160 | 7 days supply | Qty: 14 | Fill #0

## 2018-12-22 MED FILL — DEFERASIROX 360 MG TABS: 360 | 30 days supply | Qty: 60 | Fill #0

## 2018-12-22 NOTE — Progress Notes (Signed)
[facility]  Sick Visit  Subjective:  Patient ID: Debra Williamson, female    DOB: Nov 23, 1980  Age: 38 y.o. MRN: [id number]  CC:  Chief Complaint  Patient presents with  . Vaginal Discharge  . Vaginal Itching    HPI Debra Williamson is a 38 year old female who presents for Sick Visit today.  Past Medical History:  Diagnosis Date  . Anemia   . Blood transfusion without reported diagnosis   . Seizures (HCC)    one time incident, may have been r/t to a pain medication she was prescribed  . Sickle cell disease (HCC)   . Stroke [facility])    Current Status: Vaginal discharge and itching. She denies urinary frequency, discharge, dysuria, urinary itching, burning, odor, hematuria, and suprapubic pain/discomfort. No reports of GI problems such as nausea, vomiting, diarrhea, and constipation. She has no reports of blood in stools, dysuria and hematuria.  Since her last office visit, she is doing well with no complaints. She states that usually she has pain in her arms and legs. She rates her pain today at 0/10. She has not has a hospital visit for Sickle Cell Crisis since 11/25/2018 where she was treated and discharged the same day. She is currently taking all medications as prescribed and staying well hydrated. She reports occasional dizziness and headaches.   She denies fevers, chills, fatigue, recent infections, weight loss, and night sweats. She has not had any visual changes, and falls. No chest pain, heart palpitations, cough and shortness of breath reported. No depression or anxiety reported. She denies pain today.   Past Surgical History:  Procedure Laterality Date  . CESAREAN SECTION     x4  . CHOLECYSTECTOMY    . PORTACATH PLACEMENT Right w-3  . reverse tubal ligation    . TEE WITHOUT CARDIOVERSION N/A 09/10/2016   Procedure: TRANSESOPHAGEAL ECHOCARDIOGRAM (TEE);  Surgeon: Thurmon Fair, MD;  Location: [facility] ENDOSCOPY;  Service:  Cardiovascular;  Laterality: N/A;    Family History  Problem Relation Age of Onset  . HIV/AIDS Father     Social History   Socioeconomic History  . Marital status: Married    Spouse name: Not on file  . Number of children: 4  . Years of education: Not on file  . Highest education level: Not on file  Occupational History  . Occupation: Doesn't work  Engineer, production  . Financial resource strain: Not on file  . Food insecurity:    Worry: Not on file    Inability: Not on file  . Transportation needs:    Medical: Not on file    Non-medical: Not on file  Tobacco Use  . Smoking status: Never Smoker  . Smokeless tobacco: Never Used  Substance and Sexual Activity  . Alcohol use: No    Alcohol/week: 0.0 standard drinks    Comment: occasionally  . Drug use: No  . Sexual activity: Yes  Lifestyle  . Physical activity:    Days per week: Not on file    Minutes per session: Not on file  . Stress: Not on file  Relationships  . Social connections:    Talks on phone: Not on file    Gets together: Not on file    Attends religious service: Not on file    Active member of club or organization: Not on file    Attends meetings of clubs or organizations: Not on file    Relationship status: Not on file  .  Intimate partner violence:    Fear of current or ex partner: Not on file    Emotionally abused: Not on file    Physically abused: Not on file    Forced sexual activity: Not on file  Other Topics Concern  . Not on file  Social History Narrative  . Not on file    Outpatient Medications Prior to Visit  Medication Sig Dispense Refill  . albuterol (PROVENTIL HFA;VENTOLIN HFA) 108 (90 Base) MCG/ACT inhaler Inhale 2 puffs into the lungs every 4 (four) hours as needed for wheezing or shortness of breath (cough, shortness of breath or wheezing.). 1 Inhaler 1  . aspirin EC 81 MG tablet Take 1 tablet by mouth every evening.     . folic acid (FOLVITE) 1 MG tablet Take 1 mg by mouth daily.       . hydrOXYzine (ATARAX/VISTARIL) 25 MG tablet Take 0.5-1 tablets (12.5-25 mg total) by mouth every 8 (eight) hours as needed for itching. 60 tablet 1  . hydrOXYzine (VISTARIL) 25 MG capsule TAKE 1 CAPSULE BY MOUTH EVERY 6 HOURS AS NEEDED 60 capsule 1  . lamoTRIgine (LAMICTAL) 200 MG tablet TAKE 1 TABLET (200 MG TOTAL) BY MOUTH 2 TIMES DAILY. 180 tablet 3  . levETIRAcetam (KEPPRA) 1000 MG tablet TAKE 1 TABLET BY MOUTH 2 TIMES DAILY. 180 tablet 3  . lidocaine-prilocaine (EMLA) cream Apply 1 application topically as needed. 30 g 3  . meloxicam (MOBIC) 15 MG tablet Take 1 tablet (15 mg total) by mouth daily. 30 tablet 3  . omeprazole (PRILOSEC) 40 MG capsule Take 1 capsule (40 mg total) by mouth daily. 30 capsule 3  . OXYCONTIN 30 MG 12 hr tablet Take 1 tablet (30 mg total) by mouth 2 (two) times daily for 30 days. 60 tablet 0  . promethazine (PHENERGAN) 25 MG tablet Take 1 tablet (25 mg total) by mouth every 8 (eight) hours as needed for nausea or vomiting. 1 tablet every 8 hours prn 30 tablet 2  . zolpidem (AMBIEN) 5 MG tablet Take 1 tablet (5 mg total) by mouth at bedtime as needed. sleep 30 tablet 0  . Deferasirox (JADENU) 360 MG TABS Take 360 mg by mouth 2 (two) times daily.    . diclofenac sodium (VOLTAREN) 1 % GEL Apply 2 g topically 4 (four) times daily. (Patient not taking: Reported on 12/22/2018) 100 g 1   No facility-administered medications prior to visit.     Allergies  Allergen Reactions  . Ibuprofen Hives  . Zofran [Ondansetron Hcl] Hives and Itching  . Demerol [Meperidine] Other (See Comments)    Pt has seizures  . Fentanyl And Related Nausea And Vomiting and Other (See Comments)    Very sedated  **Not allergic to the injection** JUST ALLERGIC TO PATCH  . Other Itching    Greek Yogurt  . Codeine Hives and Itching  . Hydrocodone Hives and Itching    ROS Review of Systems  Constitutional: Negative.   HENT: Negative.   Eyes: Negative.   Respiratory: Negative.    Cardiovascular: Negative.   Gastrointestinal: Negative.   Endocrine: Negative.   Genitourinary: Positive for vaginal discharge.       Vaginal itching  Musculoskeletal: Negative.   Skin: Negative.   Allergic/Immunologic: Negative.   Neurological: Positive for dizziness and headaches.  Hematological: Negative.   Psychiatric/Behavioral: Negative.    Objective:    Physical Exam  Constitutional: She is oriented to person, place, and time. She appears well-developed and well-nourished.  HENT:  Head: Normocephalic and atraumatic.  Eyes: Conjunctivae are normal.  Neck: Normal range of motion. Neck supple.  Cardiovascular: Normal rate, regular rhythm, normal heart sounds and intact distal pulses.  Pulmonary/Chest: Effort normal and breath sounds normal.  Musculoskeletal: Normal range of motion.  Neurological: She is alert and oriented to person, place, and time. She has normal reflexes.  Skin: Skin is warm and dry.  Psychiatric: She has a normal mood and affect. Her behavior is normal. Judgment and thought content normal.  Nursing note and vitals reviewed.   BP 118/60 (BP Location: Left Arm, Patient Position: Sitting, Cuff Size: Small)   Pulse 78   Temp 98 F (36.7 C) (Oral)   Ht 5\' 4"  (1.626 m)   Wt 158 lb 9.6 oz (71.9 kg)   SpO2 98%   BMI 27.22 kg/m  Wt Readings from Last 3 Encounters:  12/22/18 158 lb 9.6 oz (71.9 kg)  11/26/18 166 lb 14.2 oz (75.7 kg)  11/24/18 165 lb 12.8 oz (75.2 kg)     There are no preventive care reminders to display for this patient.  There are no preventive care reminders to display for this patient.  Lab Results  Component Value Date   TSH 0.668 06/21/2016   Lab Results  Component Value Date   WBC 9.3 12/08/2018   HGB 7.7 (L) 12/08/2018   HCT 24.4 (L) 12/08/2018   MCV 88.7 12/08/2018   PLT 811 (H) 12/08/2018   Lab Results  Component Value Date   NA 137 11/30/2018   K 4.2 11/30/2018   CO2 24 11/30/2018   GLUCOSE 88 11/30/2018    BUN 10 11/30/2018   CREATININE 0.67 11/30/2018   BILITOT 2.3 (H) 11/30/2018   ALKPHOS 64 11/30/2018   AST 33 11/30/2018   ALT 25 11/30/2018   PROT 7.7 11/30/2018   ALBUMIN 4.4 11/30/2018   CALCIUM 8.9 11/30/2018   ANIONGAP 10 11/30/2018   Lab Results  Component Value Date   CHOL 128 09/07/2016   Lab Results  Component Value Date   HDL 26 (L) 09/07/2016   Lab Results  Component Value Date   LDLCALC 60 09/07/2016   Lab Results  Component Value Date   TRIG 211 (H) 09/07/2016   Lab Results  Component Value Date   CHOLHDL 4.9 09/07/2016   Lab Results  Component Value Date   HGBA1C <4.2 (L) 09/07/2016   Assessment & Plan:   1. Vaginal discharge Results are pending.  - POCT urinalysis dipstick - NuSwab Vaginitis Plus (VG+)  2. Increased storage iron - Deferasirox (JADENU) 360 MG TABS; Take 1 tablet (360 mg total) by mouth 2 (two) times daily.  Dispense: 60 tablet; Refill: 3  3. Urinary tract infection with hematuria, site unspecified We will initiate Septra today.  - sulfamethoxazole-trimethoprim (BACTRIM DS,SEPTRA DS) 800-160 MG tablet; Take 1 tablet by mouth 2 (two) times daily for 7 days.  Dispense: 14 tablet; Refill: 0  4. Follow up She will follow up in 6 weeks.   Meds ordered this encounter  Medications  . sulfamethoxazole-trimethoprim (BACTRIM DS,SEPTRA DS) 800-160 MG tablet    Sig: Take 1 tablet by mouth 2 (two) times daily for 7 days.    Dispense:  14 tablet    Refill:  0  . Deferasirox (JADENU) 360 MG TABS    Sig: Take 1 tablet (360 mg total) by mouth 2 (two) times daily.    Dispense:  60 tablet    Refill:  3   Orders Placed This  Encounter  Procedures  . NuSwab Vaginitis Plus (VG+)  . POCT urinalysis dipstick    Referral Orders  No referral(s) requested today    Raliegh Ip,  MSN, FNP-C [facility] [address] [place], [place] [address] [phone]   Problem List Items Addressed This Visit      None    Visit Diagnoses    Vaginal discharge    -  Primary   Relevant Orders   POCT urinalysis dipstick (Completed)   NuSwab Vaginitis Plus (VG+)   Increased storage iron       Relevant Medications   Deferasirox (JADENU) 360 MG TABS   Urinary tract infection with hematuria, site unspecified       Relevant Medications   sulfamethoxazole-trimethoprim (BACTRIM DS,SEPTRA DS) 800-160 MG tablet   Follow up          Meds ordered this encounter  Medications  . sulfamethoxazole-trimethoprim (BACTRIM DS,SEPTRA DS) 800-160 MG tablet    Sig: Take 1 tablet by mouth 2 (two) times daily for 7 days.    Dispense:  14 tablet    Refill:  0  . Deferasirox (JADENU) 360 MG TABS    Sig: Take 1 tablet (360 mg total) by mouth 2 (two) times daily.    Dispense:  60 tablet    Refill:  3    Follow-up: Return in about 6 weeks (around 02/02/2019).    Kallie Locks, FNP

## 2018-12-23 ENCOUNTER — Telehealth: Payer: Self-pay

## 2018-12-23 ENCOUNTER — Other Ambulatory Visit: Payer: Self-pay | Admitting: Family Medicine

## 2018-12-23 DIAGNOSIS — F1190 Opioid use, unspecified, uncomplicated: Secondary | ICD-10-CM

## 2018-12-23 DIAGNOSIS — D571 Sickle-cell disease without crisis: Secondary | ICD-10-CM

## 2018-12-23 DIAGNOSIS — G894 Chronic pain syndrome: Secondary | ICD-10-CM

## 2018-12-23 MED ORDER — OXYCODONE HCL 10 MG PO TABS: 10.0000 mg | ORAL_TABLET | ORAL | 0 refills | Status: DC

## 2018-12-23 MED FILL — oxyCODONE HCL 10 MG TABS: 10 | 15 days supply | Qty: 90 | Fill #0

## 2018-12-23 NOTE — Telephone Encounter (Signed)
Patient needs a refill on the Oxycodone 10 mg. Pharmacy doesn't have a script for it. [facility]

## 2018-12-24 ENCOUNTER — Other Ambulatory Visit: Payer: Self-pay | Admitting: Family Medicine

## 2018-12-24 MED FILL — PROAIR HFA 90 MCG INHALER: 108 (90 BAS | 30 days supply | Qty: 9 | Fill #0

## 2018-12-24 NOTE — Telephone Encounter (Signed)
Medication sent to pharmacy  

## 2018-12-25 LAB — NUSWAB VAGINITIS PLUS (VG+)
Atopobium vaginae: HIGH Score — AB
BVAB 2: HIGH Score — AB
Candida albicans, NAA: POSITIVE — AB
Candida glabrata, NAA: NEGATIVE
Chlamydia trachomatis, NAA: NEGATIVE
Megasphaera 1: HIGH Score — AB
Neisseria gonorrhoeae, NAA: NEGATIVE
Trich vag by NAA: POSITIVE — AB

## 2018-12-30 ENCOUNTER — Other Ambulatory Visit: Payer: Self-pay | Admitting: Family Medicine

## 2018-12-30 DIAGNOSIS — B379 Candidiasis, unspecified: Secondary | ICD-10-CM

## 2018-12-30 DIAGNOSIS — A599 Trichomoniasis, unspecified: Secondary | ICD-10-CM

## 2018-12-30 MED ORDER — FLUCONAZOLE 150 MG PO TABS: 150.0000 mg | ORAL_TABLET | Freq: Once | ORAL | 0 refills | Status: AC

## 2018-12-30 MED ORDER — METRONIDAZOLE 500 MG PO TABS: 2000.0000 mg | ORAL_TABLET | Freq: Once | ORAL | 0 refills | Status: AC

## 2018-12-30 MED FILL — FLUCONAZOLE 150 MG TABS: 150 | 1 days supply | Qty: 1 | Fill #0

## 2018-12-30 MED FILL — metroNIDAZOLE 500 MG TABS: 500 | 1 days supply | Qty: 4 | Fill #0

## 2019-01-02 ENCOUNTER — Other Ambulatory Visit: Payer: Self-pay | Admitting: Family Medicine

## 2019-01-02 DIAGNOSIS — B379 Candidiasis, unspecified: Secondary | ICD-10-CM

## 2019-01-02 DIAGNOSIS — A599 Trichomoniasis, unspecified: Secondary | ICD-10-CM

## 2019-01-02 MED ORDER — FLUCONAZOLE 150 MG PO TABS: 150.0000 mg | ORAL_TABLET | Freq: Once | ORAL | 0 refills | Status: AC

## 2019-01-02 MED ORDER — METRONIDAZOLE 500 MG PO TABS: 2000.0000 mg | ORAL_TABLET | Freq: Once | ORAL | 0 refills | Status: AC

## 2019-01-04 ENCOUNTER — Other Ambulatory Visit: Payer: Self-pay | Admitting: Family Medicine

## 2019-01-04 DIAGNOSIS — G4700 Insomnia, unspecified: Secondary | ICD-10-CM

## 2019-01-04 MED FILL — FLUCONAZOLE 150 MG TABS: 150 | 1 days supply | Qty: 1 | Fill #0

## 2019-01-04 MED FILL — metroNIDAZOLE 500 MG TABS: 500 | 1 days supply | Qty: 4 | Fill #0

## 2019-01-05 ENCOUNTER — Other Ambulatory Visit: Payer: Self-pay | Admitting: Family Medicine

## 2019-01-05 DIAGNOSIS — G4700 Insomnia, unspecified: Secondary | ICD-10-CM

## 2019-01-06 ENCOUNTER — Other Ambulatory Visit: Payer: Self-pay | Admitting: Family Medicine

## 2019-01-06 ENCOUNTER — Telehealth: Payer: Self-pay

## 2019-01-06 DIAGNOSIS — G4700 Insomnia, unspecified: Secondary | ICD-10-CM

## 2019-01-06 DIAGNOSIS — Z95828 Presence of other vascular implants and grafts: Secondary | ICD-10-CM

## 2019-01-06 MED ORDER — ZOLPIDEM TARTRATE 5 MG PO TABS: 5.0000 mg | ORAL_TABLET | Freq: Every evening | ORAL | 0 refills | Status: DC | PRN

## 2019-01-06 MED ORDER — LIDOCAINE-PRILOCAINE 2.5-2.5 % EX CREA: 1.0000 "application " | TOPICAL_CREAM | CUTANEOUS | 3 refills | Status: DC | PRN

## 2019-01-06 MED ORDER — HYDROXYZINE HCL 25 MG PO TABS: 12.5000 mg | ORAL_TABLET | Freq: Three times a day (TID) | ORAL | 1 refills | Status: DC | PRN

## 2019-01-06 MED FILL — PROMETHAZINE 25 MG TABLET: 25 | 10 days supply | Qty: 30 | Fill #1

## 2019-01-06 MED FILL — hydrOXYzine HCL 25 MG TABS: 25 | 20 days supply | Qty: 60 | Fill #1

## 2019-01-06 MED FILL — LIDOCAINE-PRILOCAINE CREAM: 2.5-2.5 | 10 days supply | Qty: 30 | Fill #0

## 2019-01-06 NOTE — Telephone Encounter (Signed)
Patient needs a refill on Ambien.

## 2019-01-06 NOTE — Telephone Encounter (Signed)
Patient is requesting refill- patient at [facility]

## 2019-01-06 NOTE — Telephone Encounter (Signed)
Medication sent to pharmacy  

## 2019-01-07 MED FILL — ZOLPIDEM TARTRATE 5 MG TAB: 5 | 30 days supply | Qty: 30 | Fill #0

## 2019-01-08 NOTE — Telephone Encounter (Signed)
Patient has been notified

## 2019-01-11 ENCOUNTER — Ambulatory Visit: Payer: Medicaid Other | Admitting: Family Medicine

## 2019-01-12 DIAGNOSIS — Z7689 Persons encountering health services in other specified circumstances: Secondary | ICD-10-CM | POA: Diagnosis not present

## 2019-01-12 DIAGNOSIS — D571 Sickle-cell disease without crisis: Secondary | ICD-10-CM | POA: Diagnosis not present

## 2019-01-15 DIAGNOSIS — Z7689 Persons encountering health services in other specified circumstances: Secondary | ICD-10-CM | POA: Diagnosis not present

## 2019-01-15 DIAGNOSIS — D571 Sickle-cell disease without crisis: Secondary | ICD-10-CM | POA: Diagnosis not present

## 2019-01-20 ENCOUNTER — Other Ambulatory Visit: Payer: Self-pay | Admitting: Family Medicine

## 2019-01-20 ENCOUNTER — Telehealth: Payer: Self-pay

## 2019-01-20 DIAGNOSIS — G894 Chronic pain syndrome: Secondary | ICD-10-CM

## 2019-01-20 DIAGNOSIS — D571 Sickle-cell disease without crisis: Secondary | ICD-10-CM

## 2019-01-20 DIAGNOSIS — F1190 Opioid use, unspecified, uncomplicated: Secondary | ICD-10-CM

## 2019-01-20 DIAGNOSIS — R110 Nausea: Secondary | ICD-10-CM

## 2019-01-20 MED ORDER — OXYCODONE HCL 10 MG PO TABS: 10.0000 mg | ORAL_TABLET | ORAL | 0 refills | Status: DC

## 2019-01-20 MED ORDER — PROMETHAZINE HCL 25 MG PO TABS: 25.0000 mg | ORAL_TABLET | Freq: Three times a day (TID) | ORAL | 2 refills | Status: DC | PRN

## 2019-01-20 MED ORDER — OXYCONTIN 30 MG PO T12A: 1.0000 | EXTENDED_RELEASE_TABLET | Freq: Two times a day (BID) | ORAL | 0 refills | Status: DC

## 2019-01-20 NOTE — Telephone Encounter (Signed)
Patient needs a refill on Oxycontin and Oxycodone. Patient uses [facility] resources officer

## 2019-01-21 MED FILL — DEFERASIROX 360 MG TABS: 360 | 30 days supply | Qty: 60 | Fill #1

## 2019-01-21 MED FILL — OxyCONTIN 30 MG T12A: 30 | 30 days supply | Qty: 60 | Fill #0

## 2019-01-21 MED FILL — PROMETHAZINE 25 MG TABLET: 25 | 10 days supply | Qty: 30 | Fill #0

## 2019-01-21 MED FILL — oxyCODONE HCL 10 MG TABS: 10 | 15 days supply | Qty: 90 | Fill #0

## 2019-01-21 NOTE — Telephone Encounter (Signed)
Message delivered to patient.

## 2019-02-01 ENCOUNTER — Ambulatory Visit: Payer: Medicaid Other | Admitting: Family Medicine

## 2019-02-01 DIAGNOSIS — Z9289 Personal history of other medical treatment: Secondary | ICD-10-CM | POA: Diagnosis not present

## 2019-02-01 DIAGNOSIS — D571 Sickle-cell disease without crisis: Secondary | ICD-10-CM | POA: Diagnosis not present

## 2019-02-01 DIAGNOSIS — R51 Headache: Secondary | ICD-10-CM | POA: Diagnosis not present

## 2019-02-01 DIAGNOSIS — G8929 Other chronic pain: Secondary | ICD-10-CM | POA: Diagnosis not present

## 2019-02-01 MED FILL — NARCAN 4 MG NASAL SPRAY: 4 | 2 days supply | Qty: 2 | Fill #0

## 2019-02-05 ENCOUNTER — Telehealth: Payer: Self-pay

## 2019-02-05 ENCOUNTER — Other Ambulatory Visit: Payer: Self-pay | Admitting: Family Medicine

## 2019-02-05 DIAGNOSIS — G4700 Insomnia, unspecified: Secondary | ICD-10-CM

## 2019-02-05 MED ORDER — ZOLPIDEM TARTRATE 5 MG PO TABS: 5.0000 mg | ORAL_TABLET | Freq: Every evening | ORAL | 0 refills | Status: DC | PRN

## 2019-02-05 MED FILL — ZOLPIDEM TARTRATE 5 MG TAB: 5 | 30 days supply | Qty: 30 | Fill #0

## 2019-02-11 MED FILL — LIDOCAINE-PRILOCAINE CREAM: 2.5-2.5 | 10 days supply | Qty: 30 | Fill #1

## 2019-02-16 NOTE — Telephone Encounter (Signed)
Message sent to provider 

## 2019-02-18 DIAGNOSIS — Z7689 Persons encountering health services in other specified circumstances: Secondary | ICD-10-CM | POA: Diagnosis not present

## 2019-02-18 DIAGNOSIS — D571 Sickle-cell disease without crisis: Secondary | ICD-10-CM | POA: Diagnosis not present

## 2019-02-18 NOTE — Telephone Encounter (Signed)
Message sent to provider 

## 2019-02-22 ENCOUNTER — Telehealth: Payer: Self-pay

## 2019-02-22 ENCOUNTER — Other Ambulatory Visit: Payer: Self-pay | Admitting: Family Medicine

## 2019-02-22 DIAGNOSIS — D571 Sickle-cell disease without crisis: Secondary | ICD-10-CM

## 2019-02-22 DIAGNOSIS — G894 Chronic pain syndrome: Secondary | ICD-10-CM

## 2019-02-22 DIAGNOSIS — R110 Nausea: Secondary | ICD-10-CM

## 2019-02-22 DIAGNOSIS — F1190 Opioid use, unspecified, uncomplicated: Secondary | ICD-10-CM

## 2019-02-22 MED ORDER — OXYCODONE HCL 10 MG PO TABS: 10.0000 mg | ORAL_TABLET | ORAL | 0 refills | Status: DC

## 2019-02-22 MED ORDER — PROMETHAZINE HCL 25 MG PO TABS: 25.0000 mg | ORAL_TABLET | Freq: Three times a day (TID) | ORAL | 2 refills | Status: DC | PRN

## 2019-02-22 MED ORDER — OXYCONTIN 30 MG PO T12A: 1.0000 | EXTENDED_RELEASE_TABLET | Freq: Two times a day (BID) | ORAL | 0 refills | Status: DC

## 2019-02-22 MED FILL — oxyCODONE HCL 10 MG TABS: 10 | 15 days supply | Qty: 90 | Fill #0

## 2019-02-22 MED FILL — OxyCONTIN 30 MG T12A: 30 | 30 days supply | Qty: 60 | Fill #0

## 2019-02-22 MED FILL — PROMETHAZINE 25 MG TABLET: 25 | 10 days supply | Qty: 30 | Fill #0

## 2019-02-23 ENCOUNTER — Other Ambulatory Visit: Payer: Self-pay

## 2019-02-23 MED ORDER — HYDROXYZINE HCL 25 MG PO TABS: 12.5000 mg | ORAL_TABLET | Freq: Three times a day (TID) | ORAL | 1 refills | Status: DC | PRN

## 2019-02-23 MED FILL — hydrOXYzine HCL 25 MG TABS: 25 | 20 days supply | Qty: 60 | Fill #0

## 2019-02-23 MED FILL — DEFERASIROX 360 MG TABS: 360 | 30 days supply | Qty: 60 | Fill #2

## 2019-02-23 NOTE — Telephone Encounter (Signed)
Patient notified

## 2019-02-24 MED FILL — levETIRAcetam 1000 MG TABS: 1000 | 30 days supply | Qty: 60 | Fill #2

## 2019-02-24 MED FILL — SUBVENITE 200 MG TABS: 200 | 30 days supply | Qty: 60 | Fill #2

## 2019-02-25 NOTE — Telephone Encounter (Signed)
Message sent to provider 

## 2019-03-05 ENCOUNTER — Encounter: Payer: Self-pay | Admitting: Family Medicine

## 2019-03-05 ENCOUNTER — Ambulatory Visit (INDEPENDENT_AMBULATORY_CARE_PROVIDER_SITE_OTHER): Payer: Medicaid Other | Admitting: Family Medicine

## 2019-03-05 ENCOUNTER — Other Ambulatory Visit: Payer: Self-pay

## 2019-03-05 VITALS — BP 108/60 | HR 72 | Temp 98.1°F | Ht 64.0 in | Wt 164.0 lb

## 2019-03-05 DIAGNOSIS — G894 Chronic pain syndrome: Secondary | ICD-10-CM | POA: Diagnosis not present

## 2019-03-05 DIAGNOSIS — J302 Other seasonal allergic rhinitis: Secondary | ICD-10-CM

## 2019-03-05 DIAGNOSIS — Z09 Encounter for follow-up examination after completed treatment for conditions other than malignant neoplasm: Secondary | ICD-10-CM

## 2019-03-05 DIAGNOSIS — G4700 Insomnia, unspecified: Secondary | ICD-10-CM

## 2019-03-05 DIAGNOSIS — D571 Sickle-cell disease without crisis: Secondary | ICD-10-CM

## 2019-03-05 DIAGNOSIS — K219 Gastro-esophageal reflux disease without esophagitis: Secondary | ICD-10-CM

## 2019-03-05 DIAGNOSIS — R0602 Shortness of breath: Secondary | ICD-10-CM | POA: Diagnosis not present

## 2019-03-05 LAB — POCT URINALYSIS DIP (MANUAL ENTRY)
Bilirubin, UA: NEGATIVE
Blood, UA: NEGATIVE
Glucose, UA: NEGATIVE mg/dL
Ketones, POC UA: NEGATIVE mg/dL
Leukocytes, UA: NEGATIVE
Nitrite, UA: NEGATIVE
Protein Ur, POC: NEGATIVE mg/dL
Spec Grav, UA: 1.01 (ref 1.010–1.025)
Urobilinogen, UA: 1 E.U./dL
pH, UA: 5.5 (ref 5.0–8.0)

## 2019-03-05 MED ORDER — ALBUTEROL SULFATE HFA 108 (90 BASE) MCG/ACT IN AERS: INHALATION_SPRAY | RESPIRATORY_TRACT | 11 refills | Status: DC

## 2019-03-05 MED ORDER — ZOLPIDEM TARTRATE 5 MG PO TABS: 5.0000 mg | ORAL_TABLET | Freq: Every evening | ORAL | 0 refills | Status: DC | PRN

## 2019-03-05 MED ORDER — OMEPRAZOLE 40 MG PO CPDR: 40.0000 mg | DELAYED_RELEASE_CAPSULE | Freq: Every day | ORAL | 6 refills | Status: DC

## 2019-03-05 MED ORDER — CETIRIZINE HCL 10 MG PO TABS: 10.0000 mg | ORAL_TABLET | Freq: Every day | ORAL | 11 refills | Status: DC

## 2019-03-05 MED ORDER — MELOXICAM 15 MG PO TABS: 15.0000 mg | ORAL_TABLET | Freq: Every day | ORAL | 3 refills | Status: DC

## 2019-03-05 MED FILL — ZOLPIDEM TARTRATE 5 MG TAB: 5 | 30 days supply | Qty: 30 | Fill #0

## 2019-03-05 MED FILL — PROAIR HFA 90 MCG INHALER: 108 (90 BAS | 17 days supply | Qty: 9 | Fill #0

## 2019-03-05 MED FILL — CETIRIZINE HCL 10 MG TABS: 10 | 30 days supply | Qty: 30 | Fill #0

## 2019-03-05 MED FILL — MELOXICAM 15 MG TABLET: 15 | 30 days supply | Qty: 30 | Fill #0

## 2019-03-05 MED FILL — OMEPRAZOLE 40 MG CPDR: 40 | 30 days supply | Qty: 30 | Fill #0

## 2019-03-05 NOTE — Progress Notes (Signed)
[facility]   Established Patient Office Visit  Subjective:  Patient ID: Debra Williamson, female    DOB: Aug 12, 1981  Age: 38 y.o. MRN: [id number]  CC:  Chief Complaint  Patient presents with  . Follow-up    sickle cell  . Shortness of Breath  . Rib pain    HPI Debra Williamson is a 38 year old female who presents for Follow Up today.   Past Medical History:  Diagnosis Date  . Anemia   . Blood transfusion without reported diagnosis   . Seizures (HCC)    one time incident, may have been r/t to a pain medication she was prescribed  . Sickle cell disease (HCC)   . Stroke [facility])    Current Status: Since her last office visit, she is doing well, but has been having increased fatigue lately. She states that she has pain bilaterally in her flank area and she is experiencing increasing intermittent shortness of breath lately. She rates her pain today at 5/10. She has not had a hospital visit for Sickle Cell Crisis since 11/25/2018 where she was treated and discharged on 12/01/2018.  She is currently taking all medications as prescribed and staying well hydrated. She reports occasional nausea, constipation, dizziness and headaches. She denies any recent seizures.   She denies fevers, chills, fatigue, recent infections, weight loss, and night sweats. She has not had any visual changes, and falls. No chest pain, heart palpitations, and cough reported. No reports of GI problems such as diarrhea. She has no reports of blood in stools, dysuria and hematuria. No depression or anxiety reported.   Past Surgical History:  Procedure Laterality Date  . CESAREAN SECTION     x4  . CHOLECYSTECTOMY    . PORTACATH PLACEMENT Right w-3  . reverse tubal ligation    . TEE WITHOUT CARDIOVERSION N/A 09/10/2016   Procedure: TRANSESOPHAGEAL ECHOCARDIOGRAM (TEE);  Surgeon: Thurmon Fair, MD;  Location: [facility] ENDOSCOPY;  Service: Cardiovascular;  Laterality: N/A;    Family History  Problem Relation Age of Onset  . HIV/AIDS Father     Social History   Socioeconomic History  . Marital status: Married    Spouse name: Not on file  . Number of children: 4  . Years of education: Not on file  . Highest education level: Not on file  Occupational History  . Occupation: Doesn't work  Engineer, production  . Financial resource strain: Not on file  . Food insecurity:    Worry: Not on file    Inability: Not on file  . Transportation needs:    Medical: Not on file    Non-medical: Not on file  Tobacco Use  . Smoking status: Never Smoker  . Smokeless tobacco: Never Used  Substance and Sexual Activity  . Alcohol use: No    Alcohol/week: 0.0 standard drinks    Comment: occasionally  . Drug use: No  . Sexual activity: Yes  Lifestyle  . Physical activity:    Days per week: Not on file    Minutes per session: Not on file  . Stress: Not on file  Relationships  . Social connections:    Talks on phone: Not on file    Gets together: Not on file    Attends religious service: Not on file    Active member of club or organization: Not on file    Attends meetings of clubs or organizations: Not on file    Relationship status: Not  on file  . Intimate partner violence:    Fear of current or ex partner: Not on file    Emotionally abused: Not on file    Physically abused: Not on file    Forced sexual activity: Not on file  Other Topics Concern  . Not on file  Social History Narrative  . Not on file    Outpatient Medications Prior to Visit  Medication Sig Dispense Refill  . aspirin EC 81 MG tablet Take 1 tablet by mouth every evening.     . Deferasirox (JADENU) 360 MG TABS Take 1 tablet (360 mg total) by mouth 2 (two) times daily. 60 tablet 3  . diclofenac sodium (VOLTAREN) 1 % GEL Apply 2 g topically 4 (four) times daily. 100 g 1  . folic acid (FOLVITE) 1 MG tablet Take 1 mg by mouth daily.     . hydrOXYzine (ATARAX/VISTARIL) 25 MG tablet Take 0.5-1  tablets (12.5-25 mg total) by mouth every 8 (eight) hours as needed for itching. 60 tablet 1  . lamoTRIgine (LAMICTAL) 200 MG tablet TAKE 1 TABLET (200 MG TOTAL) BY MOUTH 2 TIMES DAILY. 180 tablet 3  . levETIRAcetam (KEPPRA) 1000 MG tablet TAKE 1 TABLET BY MOUTH 2 TIMES DAILY. 180 tablet 3  . lidocaine-prilocaine (EMLA) cream APPLY TO THE AFFECTED AREA(S) AS NEEDED 30 g 3  . lidocaine-prilocaine (EMLA) cream Apply 1 application topically as needed. 30 g 3  . Oxycodone HCl 10 MG TABS Take 1 tablet (10 mg total) by mouth every 4 (four) hours for 15 days. 90 tablet 0  . OXYCONTIN 30 MG 12 hr tablet Take 1 tablet (30 mg total) by mouth 2 (two) times daily for 30 days. 60 tablet 0  . promethazine (PHENERGAN) 25 MG tablet Take 1 tablet (25 mg total) by mouth every 8 (eight) hours as needed for nausea or vomiting. 1 tablet every 8 hours prn 30 tablet 2  . meloxicam (MOBIC) 15 MG tablet Take 1 tablet (15 mg total) by mouth daily. 30 tablet 3  . omeprazole (PRILOSEC) 40 MG capsule Take 1 capsule (40 mg total) by mouth daily. 30 capsule 3  . PROAIR HFA 108 (90 Base) MCG/ACT inhaler INHALE 2 PUFFS INTO THE LUNGS EVERY 4 (FOUR) HOURS AS NEEDED FOR WHEEZING OR SHORTNESS OF BREATH (COUGH, SHORTNESS OF BREATH OR WHEEZING.). 8.5 g 3  . zolpidem (AMBIEN) 5 MG tablet Take 1 tablet (5 mg total) by mouth at bedtime as needed. sleep 30 tablet 0  . hydrOXYzine (VISTARIL) 25 MG capsule TAKE 1 CAPSULE BY MOUTH EVERY 6 HOURS AS NEEDED 60 capsule 1   No facility-administered medications prior to visit.     Allergies  Allergen Reactions  . Ibuprofen Hives  . Zofran [Ondansetron Hcl] Hives and Itching  . Demerol [Meperidine] Other (See Comments)    Pt has seizures  . Fentanyl And Related Nausea And Vomiting and Other (See Comments)    Very sedated  **Not allergic to the injection** JUST ALLERGIC TO PATCH  . Other Itching    Greek Yogurt  . Codeine Hives and Itching  . Hydrocodone Hives and Itching    ROS  Review of Systems  Constitutional: Positive for fatigue (increased).  HENT: Negative.   Eyes: Negative.   Respiratory: Positive for shortness of breath (more frequently lately).   Cardiovascular: Negative.   Gastrointestinal: Negative.   Endocrine: Negative.  Negative for cold intolerance.  Genitourinary: Negative.   Musculoskeletal: Positive for arthralgias (Generalized joint pain).  Skin: Negative.  Allergic/Immunologic: Negative.   Neurological: Positive for dizziness and headaches.  Hematological: Negative.   Psychiatric/Behavioral: Negative.       Objective:    Physical Exam  BP 108/60 (BP Location: Right Arm, Patient Position: Sitting, Cuff Size: Small)   Pulse 72   Temp 98.1 F (36.7 C) (Oral)   Ht  (1.626 m)   Wt 164 lb (74.4 kg)   LMP  (LMP Unknown)   SpO2 96%   BMI 28.15 kg/m  Wt Readings from Last 3 Encounters:  03/05/19 164 lb (74.4 kg)  12/22/18 158 lb 9.6 oz (71.9 kg)  11/26/18 166 lb 14.2 oz (75.7 kg)     There are no preventive care reminders to display for this patient.  There are no preventive care reminders to display for this patient.  Lab Results  Component Value Date   TSH 0.668 06/21/2016   Lab Results  Component Value Date   WBC 9.3 12/08/2018   HGB 7.7 (L) 12/08/2018   HCT 24.4 (L) 12/08/2018   MCV 88.7 12/08/2018   PLT 811 (H) 12/08/2018   Lab Results  Component Value Date   NA 137 11/30/2018   K 4.2 11/30/2018   CO2 24 11/30/2018   GLUCOSE 88 11/30/2018   BUN 10 11/30/2018   CREATININE 0.67 11/30/2018   BILITOT 2.3 (H) 11/30/2018   ALKPHOS 64 11/30/2018   AST 33 11/30/2018   ALT 25 11/30/2018   PROT 7.7 11/30/2018   ALBUMIN 4.4 11/30/2018   CALCIUM 8.9 11/30/2018   ANIONGAP 10 11/30/2018   Lab Results  Component Value Date   CHOL 128 09/07/2016   Lab Results  Component Value Date   HDL 26 (L) 09/07/2016   Lab Results  Component Value Date   LDLCALC 60 09/07/2016   Lab Results  Component Value Date    TRIG 211 (H) 09/07/2016   Lab Results  Component Value Date   CHOLHDL 4.9 09/07/2016   Lab Results  Component Value Date   HGBA1C <4.2 (L) 09/07/2016      Assessment & Plan:   1. 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 She is refusing to report to [facility] or ED today for further evaluation. She will consider reporting [facility] next week if needed. She will continue to take pain medications as prescribed; will continue to avoid extreme heat and cold; will continue to eat a healthy diet and drink at least 64 ounces of water daily; continue stool softener as needed; will avoid colds and flu; will continue to get plenty of sleep and rest; will continue to avoid high stressful situations and remain infection free; will continue Folic Acid 1 mg daily to avoid sickle cell crisis.  - meloxicam (MOBIC) 15 MG tablet; Take 1 tablet (15 mg total) by mouth daily.  Dispense: 30 tablet; Refill: 3  2. Chronic pain syndrome - meloxicam (MOBIC) 15 MG tablet; Take 1 tablet (15 mg total) by mouth daily.  Dispense: 30 tablet; Refill: 3  3. Insomnia, unspecified type - zolpidem (AMBIEN) 5 MG tablet; Take 1 tablet (5 mg total) by mouth at bedtime as needed. sleep  Dispense: 30 tablet; Refill: 0  4. Shortness of breath Stable. No signs or symptoms of respiratory distress noted.  - albuterol (PROAIR HFA) 108 (90 Base) MCG/ACT inhaler; INHALE 2 PUFFS INTO THE LUNGS EVERY 4 (FOUR) HOURS AS NEEDED FOR WHEEZING OR SHORTNESS OF BREATH (COUGH, SHORTNESS OF BREATH  OR WHEEZING.).  Dispense: 8.5 g; Refill: 11  5. Seasonal allergies - cetirizine (ZYRTEC) 10 MG tablet; Take 1 tablet (10 mg total) by mouth daily.  Dispense: 30 tablet; Refill: 11  6. Gastroesophageal reflux disease without esophagitis - omeprazole (PRILOSEC) 40 MG capsule; Take 1 capsule (40 mg total) by mouth daily.  Dispense: 30 capsule; Refill: 6  7. Follow up  She will [facility] as [facility]. She will follow up in 2 months for office visit.  - POCT urinalysis dipstick  Meds ordered this encounter  Medications  . meloxicam (MOBIC) 15 MG tablet    Sig: Take 1 tablet (15 mg total) by mouth daily.    Dispense:  30 tablet    Refill:  3  . omeprazole (PRILOSEC) 40 MG capsule    Sig: Take 1 capsule (40 mg total) by mouth daily.    Dispense:  30 capsule    Refill:  6  . albuterol (PROAIR HFA) 108 (90 Base) MCG/ACT inhaler    Sig: INHALE 2 PUFFS INTO THE LUNGS EVERY 4 (FOUR) HOURS AS NEEDED FOR WHEEZING OR SHORTNESS OF BREATH (COUGH, SHORTNESS OF BREATH OR WHEEZING.).    Dispense:  8.5 g    Refill:  11  . zolpidem (AMBIEN) 5 MG tablet    Sig: Take 1 tablet (5 mg total) by mouth at bedtime as needed. sleep    Dispense:  30 tablet    Refill:  0  . cetirizine (ZYRTEC) 10 MG tablet    Sig: Take 1 tablet (10 mg total) by mouth daily.    Dispense:  30 tablet    Refill:  11    Orders Placed This Encounter  Procedures  . 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 meloxicam (MOBIC) 15 MG tablet    Sig: Take 1 tablet (15 mg total) by mouth daily.    Dispense:  30 tablet    Refill:  3  . omeprazole (PRILOSEC) 40 MG capsule    Sig: Take 1 capsule (40 mg total) by mouth daily.    Dispense:  30 capsule    Refill:  6  . albuterol (PROAIR HFA) 108 (90 Base) MCG/ACT inhaler    Sig: INHALE 2 PUFFS INTO THE LUNGS EVERY 4 (FOUR) HOURS AS NEEDED FOR WHEEZING OR SHORTNESS OF BREATH (COUGH, SHORTNESS OF  BREATH OR WHEEZING.).    Dispense:  8.5 g    Refill:  11  . zolpidem (AMBIEN) 5 MG tablet    Sig: Take 1 tablet (5 mg total) by mouth at bedtime as needed. sleep    Dispense:  30 tablet    Refill:  0  . cetirizine (ZYRTEC) 10 MG tablet    Sig: Take 1 tablet (10 mg total) by mouth daily.    Dispense:  30 tablet    Refill:  11    Follow-up: Return in about 2 months (around 05/05/2019).    Kallie Locks, FNP

## 2019-03-16 DIAGNOSIS — D571 Sickle-cell disease without crisis: Secondary | ICD-10-CM | POA: Diagnosis not present

## 2019-03-16 DIAGNOSIS — Z7689 Persons encountering health services in other specified circumstances: Secondary | ICD-10-CM | POA: Diagnosis not present

## 2019-03-18 DIAGNOSIS — D571 Sickle-cell disease without crisis: Secondary | ICD-10-CM | POA: Diagnosis not present

## 2019-03-24 ENCOUNTER — Other Ambulatory Visit: Payer: Self-pay | Admitting: Family Medicine

## 2019-03-24 ENCOUNTER — Telehealth: Payer: Self-pay

## 2019-03-24 DIAGNOSIS — D571 Sickle-cell disease without crisis: Secondary | ICD-10-CM

## 2019-03-24 DIAGNOSIS — R110 Nausea: Secondary | ICD-10-CM

## 2019-03-24 DIAGNOSIS — G894 Chronic pain syndrome: Secondary | ICD-10-CM

## 2019-03-24 DIAGNOSIS — F1190 Opioid use, unspecified, uncomplicated: Secondary | ICD-10-CM

## 2019-03-24 MED ORDER — OXYCODONE HCL 10 MG PO TABS: 10.0000 mg | ORAL_TABLET | ORAL | 0 refills | Status: DC

## 2019-03-24 MED ORDER — PROMETHAZINE HCL 25 MG PO TABS: 25.0000 mg | ORAL_TABLET | Freq: Three times a day (TID) | ORAL | 2 refills | Status: DC | PRN

## 2019-03-24 MED ORDER — OXYCONTIN 30 MG PO T12A: 1.0000 | EXTENDED_RELEASE_TABLET | Freq: Two times a day (BID) | ORAL | 0 refills | Status: DC

## 2019-03-24 MED FILL — PROMETHAZINE 25 MG TABLET: 25 | 10 days supply | Qty: 30 | Fill #0

## 2019-03-24 MED FILL — DEFERASIROX 360 MG TABS: 360 | 30 days supply | Qty: 60 | Fill #3

## 2019-03-24 NOTE — Telephone Encounter (Signed)
Message sent to provider 

## 2019-03-25 MED FILL — OxyCONTIN 30 MG T12A: 30 | 30 days supply | Qty: 60 | Fill #0

## 2019-03-25 MED FILL — oxyCODONE HCL 10 MG TABS: 10 | 15 days supply | Qty: 90 | Fill #0

## 2019-03-25 NOTE — Telephone Encounter (Signed)
Patient will come by office to pick up script

## 2019-04-07 ENCOUNTER — Other Ambulatory Visit: Payer: Self-pay | Admitting: Family Medicine

## 2019-04-07 DIAGNOSIS — G4700 Insomnia, unspecified: Secondary | ICD-10-CM

## 2019-04-08 ENCOUNTER — Other Ambulatory Visit: Payer: Self-pay | Admitting: Family Medicine

## 2019-04-08 ENCOUNTER — Telehealth: Payer: Self-pay

## 2019-04-08 DIAGNOSIS — G4700 Insomnia, unspecified: Secondary | ICD-10-CM

## 2019-04-08 MED ORDER — ZOLPIDEM TARTRATE 5 MG PO TABS: 5.0000 mg | ORAL_TABLET | Freq: Every evening | ORAL | 0 refills | Status: DC | PRN

## 2019-04-08 MED FILL — ZOLPIDEM TARTRATE 5 MG TAB: 5 | 30 days supply | Qty: 30 | Fill #0

## 2019-04-08 NOTE — Telephone Encounter (Signed)
Patient calling for refill on Ambien.

## 2019-04-08 NOTE — Telephone Encounter (Signed)
Patient notified

## 2019-04-14 DIAGNOSIS — D571 Sickle-cell disease without crisis: Secondary | ICD-10-CM | POA: Diagnosis not present

## 2019-04-16 DIAGNOSIS — Z7689 Persons encountering health services in other specified circumstances: Secondary | ICD-10-CM | POA: Diagnosis not present

## 2019-04-16 DIAGNOSIS — D571 Sickle-cell disease without crisis: Secondary | ICD-10-CM | POA: Diagnosis not present

## 2019-04-21 ENCOUNTER — Telehealth: Payer: Self-pay

## 2019-04-22 ENCOUNTER — Other Ambulatory Visit: Payer: Self-pay | Admitting: Family Medicine

## 2019-04-22 DIAGNOSIS — R110 Nausea: Secondary | ICD-10-CM

## 2019-04-22 DIAGNOSIS — F1190 Opioid use, unspecified, uncomplicated: Secondary | ICD-10-CM

## 2019-04-22 DIAGNOSIS — D571 Sickle-cell disease without crisis: Secondary | ICD-10-CM

## 2019-04-22 DIAGNOSIS — G894 Chronic pain syndrome: Secondary | ICD-10-CM

## 2019-04-22 MED ORDER — OXYCODONE HCL 10 MG PO TABS: 10.0000 mg | ORAL_TABLET | ORAL | 0 refills | Status: DC

## 2019-04-22 MED ORDER — OXYCONTIN 30 MG PO T12A: 1.0000 | EXTENDED_RELEASE_TABLET | Freq: Two times a day (BID) | ORAL | 0 refills | Status: DC

## 2019-04-22 MED ORDER — PROMETHAZINE HCL 25 MG PO TABS: 25.0000 mg | ORAL_TABLET | Freq: Three times a day (TID) | ORAL | 2 refills | Status: DC | PRN

## 2019-04-22 MED FILL — PROMETHAZINE 25 MG TABLET: 25 | 10 days supply | Qty: 30 | Fill #0

## 2019-04-22 MED FILL — OxyCONTIN 30 MG T12A: 30 | 30 days supply | Qty: 60 | Fill #0

## 2019-04-22 MED FILL — oxyCODONE HCL 10 MG TABS: 10 | 15 days supply | Qty: 90 | Fill #0

## 2019-04-22 NOTE — Telephone Encounter (Signed)
Patient notified

## 2019-04-29 MED FILL — LIDOCAINE-PRILOCAINE CREAM: 2.5-2.5 | 10 days supply | Qty: 30 | Fill #2

## 2019-04-29 MED FILL — ALBUTEROL SULFATE HFA 108 (: 108 (90 BAS | 17 days supply | Qty: 9 | Fill #1

## 2019-05-02 MED FILL — levETIRAcetam 1000 MG TABS: 1000 | 30 days supply | Qty: 60 | Fill #3

## 2019-05-02 MED FILL — SUBVENITE 200 MG TABS: 200 | 30 days supply | Qty: 60 | Fill #3

## 2019-05-03 MED FILL — DEFERASIROX 360 MG TABS: 360 | 30 days supply | Qty: 60 | Fill #0

## 2019-05-05 ENCOUNTER — Ambulatory Visit (INDEPENDENT_AMBULATORY_CARE_PROVIDER_SITE_OTHER): Payer: Medicaid Other | Admitting: Family Medicine

## 2019-05-05 ENCOUNTER — Encounter: Payer: Self-pay | Admitting: Family Medicine

## 2019-05-05 ENCOUNTER — Other Ambulatory Visit: Payer: Self-pay

## 2019-05-05 VITALS — BP 122/78 | HR 84 | Temp 98.0°F | Ht 64.0 in | Wt 165.0 lb

## 2019-05-05 DIAGNOSIS — Z79899 Other long term (current) drug therapy: Secondary | ICD-10-CM

## 2019-05-05 DIAGNOSIS — G4700 Insomnia, unspecified: Secondary | ICD-10-CM

## 2019-05-05 DIAGNOSIS — D571 Sickle-cell disease without crisis: Secondary | ICD-10-CM

## 2019-05-05 DIAGNOSIS — F1190 Opioid use, unspecified, uncomplicated: Secondary | ICD-10-CM

## 2019-05-05 DIAGNOSIS — G894 Chronic pain syndrome: Secondary | ICD-10-CM

## 2019-05-05 DIAGNOSIS — Z09 Encounter for follow-up examination after completed treatment for conditions other than malignant neoplasm: Secondary | ICD-10-CM

## 2019-05-05 LAB — POCT URINALYSIS DIP (MANUAL ENTRY)
Bilirubin, UA: NEGATIVE
Blood, UA: NEGATIVE
Glucose, UA: NEGATIVE mg/dL
Ketones, POC UA: NEGATIVE mg/dL
Leukocytes, UA: NEGATIVE
Nitrite, UA: NEGATIVE
Protein Ur, POC: 30 mg/dL — AB
Spec Grav, UA: 1.015 (ref 1.010–1.025)
Urobilinogen, UA: 0.2 E.U./dL
pH, UA: 5.5 (ref 5.0–8.0)

## 2019-05-05 NOTE — Progress Notes (Signed)
[facility]   Established Patient Office Visit  Subjective:  Patient ID: Debra Williamson, female    DOB: 1980/12/14  Age: 38 y.o. MRN: [id number]  CC:  Chief Complaint  Patient presents with  . Follow-up    sickle cell    HPI Debra Williamson is a 38 year old female who presents for follow up today.    Past Medical History:  Diagnosis Date  . Anemia   . Blood transfusion without reported diagnosis   . Seizures (HCC)    one time incident, may have been r/t to a pain medication she was prescribed  . Sickle cell disease (HCC)   . Stroke [facility](HCC)    Current Status: Since her last office visit, she is doing well with no complaints. She states that she has pain in her arms, lower back and legs. She also reports more headaches. She admits that she has not been drinking water as directed. She rates her pain today at 0/10. he has not had a hospital visit for Sickle Cell Crisis since 11/25/2018 where treated and discharged on 12/01/2018. She is currently taking all medications as prescribed. She and staying well hydrated. She reports occasional nausea, constipation, dizziness and headaches. She states that she has been taking her pain medications more frequently. Her anxiety is mild today. She denies suicidal ideations, homicidal ideations, or auditory hallucinations.   She denies fevers, chills, fatigue, recent infections, weight loss, and night sweats. She has not had any  visual changes, and falls. No chest pain, heart palpitations, cough and shortness of breath reported. No reports of GI problems such as vomiting and diarrhea. She has no reports of blood in stools, dysuria and hematuria.   Past Surgical History:  Procedure Laterality Date  . CESAREAN SECTION     x4  . CHOLECYSTECTOMY    . PORTACATH PLACEMENT Right w-3  . reverse tubal ligation    . TEE WITHOUT CARDIOVERSION N/A 09/10/2016   Procedure: TRANSESOPHAGEAL ECHOCARDIOGRAM (TEE);   Surgeon: Thurmon FairMihai Croitoru, MD;  [facility];  Service: Cardiovascular;  Laterality: N/A;    Family History  Problem Relation Age of Onset  . HIV/AIDS Father     Social History   Socioeconomic History  . Marital status: Married    Spouse name: Not on file  . Number of children: 4  . Years of education: Not on file  . Highest education level: Not on file  Occupational History  . Occupation: Doesn't work  Engineer, productionocial Needs  . Financial resource strain: Not on file  . Food insecurity    Worry: Not on file    Inability: Not on file  . Transportation needs    Medical: Not on file    Non-medical: Not on file  Tobacco Use  . Smoking status: Never Smoker  . Smokeless tobacco: Never Used  Substance and Sexual Activity  . Alcohol use: No    Alcohol/week: 0.0 standard drinks    Comment: occasionally  . Drug use: No  . Sexual activity: Yes  Lifestyle  . Physical activity    Days per week: Not on file    Minutes per session: Not on file  . Stress: Not on file  Relationships  . Social Musicianconnections    Talks on phone: Not on file    Gets together: Not on file    Attends religious service: Not on file    Active member of club or organization: Not on file  Attends meetings of clubs or organizations: Not on file    Relationship status: Not on file  . Intimate partner violence    Fear of current or ex partner: Not on file    Emotionally abused: Not on file    Physically abused: Not on file    Forced sexual activity: Not on file  Other Topics Concern  . Not on file  Social History Narrative  . Not on file    Outpatient Medications Prior to Visit  Medication Sig Dispense Refill  . albuterol (PROAIR HFA) 108 (90 Base) MCG/ACT inhaler INHALE 2 PUFFS INTO THE LUNGS EVERY 4 (FOUR) HOURS AS NEEDED FOR WHEEZING OR SHORTNESS OF BREATH (COUGH, SHORTNESS OF BREATH OR WHEEZING.). 8.5 g 11  . aspirin EC 81 MG tablet Take 1 tablet by mouth every evening.     . cetirizine (ZYRTEC) 10  MG tablet Take 1 tablet (10 mg total) by mouth daily. 30 tablet 11  . Deferasirox (JADENU) 360 MG TABS Take 1 tablet (360 mg total) by mouth 2 (two) times daily. 60 tablet 3  . diclofenac sodium (VOLTAREN) 1 % GEL Apply 2 g topically 4 (four) times daily. 937 g 1  . folic acid (FOLVITE) 1 MG tablet Take 1 mg by mouth daily.     . hydrOXYzine (ATARAX/VISTARIL) 25 MG tablet Take 0.5-1 tablets (12.5-25 mg total) by mouth every 8 (eight) hours as needed for itching. 60 tablet 1  . lamoTRIgine (LAMICTAL) 200 MG tablet TAKE 1 TABLET (200 MG TOTAL) BY MOUTH 2 TIMES DAILY. 180 tablet 3  . levETIRAcetam (KEPPRA) 1000 MG tablet TAKE 1 TABLET BY MOUTH 2 TIMES DAILY. 180 tablet 3  . lidocaine-prilocaine (EMLA) cream APPLY TO THE AFFECTED AREA(S) AS NEEDED 30 g 3  . lidocaine-prilocaine (EMLA) cream Apply 1 application topically as needed. 30 g 3  . meloxicam (MOBIC) 15 MG tablet Take 1 tablet (15 mg total) by mouth daily. 30 tablet 3  . omeprazole (PRILOSEC) 40 MG capsule Take 1 capsule (40 mg total) by mouth daily. 30 capsule 6  . OXYCONTIN 30 MG 12 hr tablet Take 1 tablet (30 mg total) by mouth 2 (two) times daily. 60 tablet 0  . promethazine (PHENERGAN) 25 MG tablet Take 1 tablet (25 mg total) by mouth every 8 (eight) hours as needed for nausea or vomiting. 1 tablet every 8 hours prn 30 tablet 2  . zolpidem (AMBIEN) 5 MG tablet Take 1 tablet (5 mg total) by mouth at bedtime as needed. sleep 30 tablet 0  . Oxycodone HCl 10 MG TABS Take 1 tablet (10 mg total) by mouth every 4 (four) hours for 15 days. 90 tablet 0   No facility-administered medications prior to visit.     Allergies  Allergen Reactions  . Ibuprofen Hives  . Zofran [Ondansetron Hcl] Hives and Itching  . Demerol [Meperidine] Other (See Comments)    Pt has seizures  . Fentanyl And Related Nausea And Vomiting and Other (See Comments)    Very sedated  **Not allergic to the injection** JUST ALLERGIC TO PATCH  . Other Itching    Greek  Yogurt  . Codeine Hives and Itching  . Hydrocodone Hives and Itching    ROS Review of Systems  Constitutional: Negative.   HENT: Negative.   Eyes: Negative.   Respiratory: Negative.   Cardiovascular: Negative.   Gastrointestinal: Positive for constipation (occasional) and nausea (occasional ).  Endocrine: Negative.   Genitourinary: Negative.   Musculoskeletal: Positive for arthralgias (generalized ).  Skin: Negative.   Allergic/Immunologic: Negative.   Neurological: Positive for dizziness (occasional), weakness (left sided) and headaches (occasional).  Hematological: Negative.   Psychiatric/Behavioral: Negative.    Objective:    Physical Exam  Constitutional: She is oriented to person, place, and time. She appears well-developed and well-nourished.  HENT:  Head: Normocephalic and atraumatic.  Eyes: Conjunctivae are normal.  Neck: Normal range of motion. Neck supple.  Cardiovascular: Normal rate, regular rhythm, normal heart sounds and intact distal pulses.  Pulmonary/Chest: Effort normal and breath sounds normal.  Abdominal: Soft. Bowel sounds are normal.  Musculoskeletal: Normal range of motion.  Neurological: She is alert and oriented to person, place, and time. She has normal reflexes.  Skin: Skin is warm and dry. There is erythema. There is pallor.  Psychiatric: She has a normal mood and affect. Her behavior is normal. Judgment and thought content normal.  Vitals reviewed.   BP 122/78 (BP Location: Left Arm, Patient Position: Sitting, Cuff Size: Large)   Pulse 84   Temp 98 F (36.7 C) (Oral)   Ht 5\' 4"  (1.626 m)   Wt 165 lb (74.8 kg)   SpO2 96%   BMI 28.32 kg/m  Wt Readings from Last 3 Encounters:  05/05/19 165 lb (74.8 kg)  03/05/19 164 lb (74.4 kg)  12/22/18 158 lb 9.6 oz (71.9 kg)     There are no preventive care reminders to display for this patient.  There are no preventive care reminders to display for this patient.  Lab Results  Component Value  Date   TSH 0.668 06/21/2016   Lab Results  Component Value Date   WBC 9.3 12/08/2018   HGB 7.7 (L) 12/08/2018   HCT 24.4 (L) 12/08/2018   MCV 88.7 12/08/2018   PLT 811 (H) 12/08/2018   Lab Results  Component Value Date   NA 137 11/30/2018   K 4.2 11/30/2018   CO2 24 11/30/2018   GLUCOSE 88 11/30/2018   BUN 10 11/30/2018   CREATININE 0.67 11/30/2018   BILITOT 2.3 (H) 11/30/2018   ALKPHOS 64 11/30/2018   AST 33 11/30/2018   ALT 25 11/30/2018   PROT 7.7 11/30/2018   ALBUMIN 4.4 11/30/2018   CALCIUM 8.9 11/30/2018   ANIONGAP 10 11/30/2018   Lab Results  Component Value Date   CHOL 128 09/07/2016   Lab Results  Component Value Date   HDL 26 (L) 09/07/2016   Lab Results  Component Value Date   LDLCALC 60 09/07/2016   Lab Results  Component Value Date   TRIG 211 (H) 09/07/2016   Lab Results  Component Value Date   CHOLHDL 4.9 09/07/2016   Lab Results  Component Value Date   HGBA1C <4.2 (L) 09/07/2016   Assessment & Plan:   1. Hb-SS disease without crisis [facility](HCC) She is doing well today. We will increase dosage of short-acting Oxycodone to 15 mg and decrease frequency to every 6 hours. She will continue to take pain medications as prescribed; will continue to avoid extreme heat and cold; will continue to eat a healthy diet and drink at least 64 ounces of water daily; continue stool softener as needed; will avoid colds and flu; will continue to get plenty of sleep and rest; will continue to avoid high stressful situations and remain infection free; will continue Folic Acid 1 mg daily to avoid sickle cell crisis.  - oxyCODONE (ROXICODONE) 15 MG immediate release tablet; Take 1 tablet (15 mg total) by mouth every 6 (six) hours as needed for  up to 15 days for pain.  Dispense: 60 tablet; Refill: 0  2. Chronic pain syndrome Short-acting Oxycodone dose increase, frequency decrease today.  - oxyCODONE (ROXICODONE) 15 MG immediate release tablet; Take 1 tablet (15 mg total)  by mouth every 6 (six) hours as needed for up to 15 days for pain.  Dispense: 60 tablet; Refill: 0  3. Chronic, continuous use of opioids - oxyCODONE (ROXICODONE) 15 MG immediate release tablet; Take 1 tablet (15 mg total) by mouth every 6 (six) hours as needed for up to 15 days for pain.  Dispense: 60 tablet; Refill: 0  4. Insomnia, unspecified type Stable. Continue Ambien as prescribed.   5. Medication management Oxy-IR dose increase today.   6. Follow up She will follow up in 2 months.  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 oxyCODONE (ROXICODONE) 15 MG immediate release tablet    Sig: Take 1 tablet (15 mg total) by mouth every 6 (six) hours as needed for up to 15 days for pain.    Dispense:  60 tablet    Refill:  0    Dose increase to Oxycodone 15 mg today for better pain management.    Order Specific Question:   Supervising Provider    Answer:   Quentin AngstJEGEDE, OLUGBEMIGA E [[id number]]    Follow-up: Return in about 2 months (around 07/06/2019).    Kallie LocksNatalie M Sadiyah Kangas, FNP

## 2019-05-07 MED ORDER — OXYCODONE HCL 15 MG PO TABS: 15.0000 mg | ORAL_TABLET | Freq: Four times a day (QID) | ORAL | 0 refills | Status: AC | PRN

## 2019-05-07 MED FILL — oxyCODONE HCL 15 MG TABS: 15 | 15 days supply | Qty: 60 | Fill #0

## 2019-05-11 ENCOUNTER — Telehealth: Payer: Self-pay

## 2019-05-11 DIAGNOSIS — D571 Sickle-cell disease without crisis: Secondary | ICD-10-CM | POA: Diagnosis not present

## 2019-05-11 DIAGNOSIS — Z7689 Persons encountering health services in other specified circumstances: Secondary | ICD-10-CM | POA: Diagnosis not present

## 2019-05-11 NOTE — Telephone Encounter (Signed)
Patient notified

## 2019-05-11 NOTE — Telephone Encounter (Signed)
-----   Message from [name], [place] sent at 05/07/2019  4:17 PM EDT ----- Regarding: "Dose increase" Please inform patient that Oxy-IR dose is increased, but frequency is decreased to every 6 hours. Rx sent to pharmacy today. Thanks!

## 2019-05-12 ENCOUNTER — Other Ambulatory Visit: Payer: Self-pay | Admitting: Family Medicine

## 2019-05-12 ENCOUNTER — Telehealth: Payer: Self-pay

## 2019-05-12 DIAGNOSIS — G4700 Insomnia, unspecified: Secondary | ICD-10-CM

## 2019-05-12 DIAGNOSIS — Z7689 Persons encountering health services in other specified circumstances: Secondary | ICD-10-CM | POA: Diagnosis not present

## 2019-05-12 DIAGNOSIS — D571 Sickle-cell disease without crisis: Secondary | ICD-10-CM | POA: Diagnosis not present

## 2019-05-12 MED ORDER — ZOLPIDEM TARTRATE 5 MG PO TABS: 5.0000 mg | ORAL_TABLET | Freq: Every evening | ORAL | 0 refills | Status: DC | PRN

## 2019-05-12 NOTE — Telephone Encounter (Signed)
Patient needs a refill on Ambien.

## 2019-05-13 MED FILL — ZOLPIDEM TARTRATE 5 MG TAB: 5 | 30 days supply | Qty: 30 | Fill #0

## 2019-05-13 NOTE — Telephone Encounter (Signed)
Patient notified

## 2019-05-14 DIAGNOSIS — D5740 Sickle-cell thalassemia without crisis: Secondary | ICD-10-CM | POA: Diagnosis not present

## 2019-05-14 DIAGNOSIS — G459 Transient cerebral ischemic attack, unspecified: Secondary | ICD-10-CM | POA: Diagnosis not present

## 2019-05-17 ENCOUNTER — Telehealth: Payer: Self-pay

## 2019-05-17 NOTE — Telephone Encounter (Signed)
Patient states that the Oxycodone 15mg  is to strong and that it makes her sick. Patient has stop taking the medication.

## 2019-05-22 DIAGNOSIS — G43909 Migraine, unspecified, not intractable, without status migrainosus: Secondary | ICD-10-CM

## 2019-05-22 HISTORY — DX: Migraine, unspecified, not intractable, without status migrainosus: G43.909

## 2019-05-24 ENCOUNTER — Other Ambulatory Visit: Payer: Self-pay

## 2019-05-24 ENCOUNTER — Ambulatory Visit (INDEPENDENT_AMBULATORY_CARE_PROVIDER_SITE_OTHER): Payer: Medicaid Other | Admitting: Family Medicine

## 2019-05-24 ENCOUNTER — Encounter: Payer: Self-pay | Admitting: Family Medicine

## 2019-05-24 VITALS — BP 100/64 | HR 78 | Temp 97.9°F | Ht 64.0 in | Wt 168.0 lb

## 2019-05-24 DIAGNOSIS — F1190 Opioid use, unspecified, uncomplicated: Secondary | ICD-10-CM

## 2019-05-24 DIAGNOSIS — G4700 Insomnia, unspecified: Secondary | ICD-10-CM

## 2019-05-24 DIAGNOSIS — D571 Sickle-cell disease without crisis: Secondary | ICD-10-CM | POA: Diagnosis not present

## 2019-05-24 DIAGNOSIS — R599 Enlarged lymph nodes, unspecified: Secondary | ICD-10-CM | POA: Diagnosis not present

## 2019-05-24 DIAGNOSIS — Z09 Encounter for follow-up examination after completed treatment for conditions other than malignant neoplasm: Secondary | ICD-10-CM

## 2019-05-24 DIAGNOSIS — G894 Chronic pain syndrome: Secondary | ICD-10-CM

## 2019-05-24 DIAGNOSIS — Z79899 Other long term (current) drug therapy: Secondary | ICD-10-CM

## 2019-05-24 DIAGNOSIS — R110 Nausea: Secondary | ICD-10-CM

## 2019-05-24 MED ORDER — OXYCODONE HCL 10 MG PO TABS: 10.0000 mg | ORAL_TABLET | ORAL | 0 refills | Status: DC | PRN

## 2019-05-24 MED ORDER — OXYCONTIN 30 MG PO T12A: 1.0000 | EXTENDED_RELEASE_TABLET | Freq: Two times a day (BID) | ORAL | 0 refills | Status: DC

## 2019-05-24 MED FILL — oxyCODONE HCL 10 MG TABS: 10 | 15 days supply | Qty: 90 | Fill #0

## 2019-05-24 MED FILL — OxyCONTIN 30 MG T12A: 30 | 30 days supply | Qty: 60 | Fill #0

## 2019-05-24 NOTE — Progress Notes (Signed)
Patient [facility]  Established Patient Office Visit  Subjective:  Patient ID: Debra Williamson, female    DOB: July 04, 1981  Age: 38 y.o. MRN: [id number]  CC:  Chief Complaint  Patient presents with  . Mass    left under arm    HPI Debra Williamson is a 38 year old female presents for follow up today.  Past Medical History:  Diagnosis Date  . Anemia   . Blood transfusion without reported diagnosis   . Seizures ([place])    one time incident, may have been r/t to a pain medication she was prescribed  . Sickle cell disease ([place])   . Stroke [facility])    Current Status: Since her last office visit, she is doing well with no complaints. She states that she has pain in her arms, lower back, and legs. She rates her pain today at 3/10. She is unable to tolerate short-acting Oxycodone 15 mg because of undesirable side effects. She has not had a hospital visit for Sickle Cell Crisis since 11/25/2018 where she was treated and discharged on 12/01/2018. She is currently taking all medications as prescribed and staying well hydrated. She reports occasional nausea, constipation, dizziness and headaches. She reports 2 'knots' under her left armpit since 05/05/2019, which she palpated while showering. She continues to have regular blood exchanges with Hematologist in [place] every month. She states that she is scheduled for a MRI of abdomen.   She denies fevers, chills, fatigue, recent infections, weight loss, and night sweats. She has not had any visual changes, and falls. No chest pain, heart palpitations, cough and shortness of breath reported. No reports of GI problems such as vomiting, and diarrhea. She has no reports of blood in stools, dysuria and hematuria. No depression or anxiety reported.   Past Surgical History:  Procedure Laterality Date  . CESAREAN SECTION     x4  . CHOLECYSTECTOMY    . PORTACATH PLACEMENT Right w-3  . reverse tubal ligation    . TEE  WITHOUT CARDIOVERSION N/A 09/10/2016   Procedure: TRANSESOPHAGEAL ECHOCARDIOGRAM (TEE);  Surgeon: Sanda Klein, MD;  Location: [facility] ENDOSCOPY;  Service: Cardiovascular;  Laterality: N/A;    Family History  Problem Relation Age of Onset  . HIV/AIDS Father     Social History   Socioeconomic History  . Marital status: Married    Spouse name: Not on file  . Number of children: 4  . Years of education: Not on file  . Highest education level: Not on file  Occupational History  . Occupation: Doesn't work  Scientific laboratory technician  . Financial resource strain: Not on file  . Food insecurity    Worry: Not on file    Inability: Not on file  . Transportation needs    Medical: Not on file    Non-medical: Not on file  Tobacco Use  . Smoking status: Never Smoker  . Smokeless tobacco: Never Used  Substance and Sexual Activity  . Alcohol use: No    Alcohol/week: 0.0 standard drinks    Comment: occasionally  . Drug use: No  . Sexual activity: Yes  Lifestyle  . Physical activity    Days per week: Not on file    Minutes per session: Not on file  . Stress: Not on file  Relationships  . Social Herbalist on phone: Not on file    Gets together: Not on file    Attends religious service: Not on file  Active member of club or organization: Not on file    Attends meetings of clubs or organizations: Not on file    Relationship status: Not on file  . Intimate partner violence    Fear of current or ex partner: Not on file    Emotionally abused: Not on file    Physically abused: Not on file    Forced sexual activity: Not on file  Other Topics Concern  . Not on file  Social History Narrative  . Not on file    Outpatient Medications Prior to Visit  Medication Sig Dispense Refill  . albuterol (PROAIR HFA) 108 (90 Base) MCG/ACT inhaler INHALE 2 PUFFS INTO THE LUNGS EVERY 4 (FOUR) HOURS AS NEEDED FOR WHEEZING OR SHORTNESS OF BREATH (COUGH, SHORTNESS OF BREATH OR WHEEZING.). 8.5 g 11  .  aspirin EC 81 MG tablet Take 1 tablet by mouth every evening.     . cetirizine (ZYRTEC) 10 MG tablet Take 1 tablet (10 mg total) by mouth daily. 30 tablet 11  . Deferasirox (JADENU) 360 MG TABS Take 1 tablet (360 mg total) by mouth 2 (two) times daily. 60 tablet 3  . diclofenac sodium (VOLTAREN) 1 % GEL Apply 2 g topically 4 (four) times daily. 100 g 1  . folic acid (FOLVITE) 1 MG tablet Take 1 mg by mouth daily.     . hydrOXYzine (ATARAX/VISTARIL) 25 MG tablet Take 0.5-1 tablets (12.5-25 mg total) by mouth every 8 (eight) hours as needed for itching. 60 tablet 1  . lamoTRIgine (LAMICTAL) 200 MG tablet TAKE 1 TABLET (200 MG TOTAL) BY MOUTH 2 TIMES DAILY. 180 tablet 3  . levETIRAcetam (KEPPRA) 1000 MG tablet TAKE 1 TABLET BY MOUTH 2 TIMES DAILY. 180 tablet 3  . lidocaine-prilocaine (EMLA) cream APPLY TO THE AFFECTED AREA(S) AS NEEDED 30 g 3  . lidocaine-prilocaine (EMLA) cream Apply 1 application topically as needed. 30 g 3  . meloxicam (MOBIC) 15 MG tablet Take 1 tablet (15 mg total) by mouth daily. 30 tablet 3  . omeprazole (PRILOSEC) 40 MG capsule Take 1 capsule (40 mg total) by mouth daily. 30 capsule 6  . promethazine (PHENERGAN) 25 MG tablet Take 1 tablet (25 mg total) by mouth every 8 (eight) hours as needed for nausea or vomiting. 1 tablet every 8 hours prn 30 tablet 2  . zolpidem (AMBIEN) 5 MG tablet Take 1 tablet (5 mg total) by mouth at bedtime as needed. sleep 30 tablet 0   No facility-administered medications prior to visit.     Allergies  Allergen Reactions  . Ibuprofen Hives  . Zofran [Ondansetron Hcl] Hives and Itching  . Demerol [Meperidine] Other (See Comments)    Pt has seizures  . Fentanyl And Related Nausea And Vomiting and Other (See Comments)    Very sedated  **Not allergic to the injection** JUST ALLERGIC TO PATCH  . Other Itching    Greek Yogurt  . Codeine Hives and Itching  . Hydrocodone Hives and Itching    ROS Review of Systems  Constitutional: Negative.    HENT: Negative.   Eyes: Negative.   Respiratory: Negative.   Cardiovascular: Negative.   Gastrointestinal: Positive for constipation (occasional) and nausea (Occasional).  Genitourinary: Negative.   Musculoskeletal: Positive for arthralgias (generalized).  Skin: Negative.   Allergic/Immunologic: Negative.   Neurological: Positive for dizziness (occasional), weakness (left extremity) and headaches (occasional).  Hematological: Negative.   Psychiatric/Behavioral: Negative.    Objective:    Physical Exam  Constitutional: She is oriented  to person, place, and time. She appears well-developed and well-nourished.  HENT:  Head: Normocephalic and atraumatic.  Eyes: Conjunctivae are normal.  Neck: Normal range of motion. Neck supple.  Cardiovascular: Normal rate, regular rhythm, normal heart sounds and intact distal pulses.  Pulmonary/Chest: Effort normal and breath sounds normal.  Abdominal: Soft. Bowel sounds are normal.  Musculoskeletal: Normal range of motion.     Comments: Limited ROM in left extremity.  Neurological: She is alert and oriented to person, place, and time. She has normal reflexes.  Skin: Skin is warm and dry.  Psychiatric: She has a normal mood and affect. Her behavior is normal. Judgment and thought content normal.  Nursing note and vitals reviewed.   BP 100/64 (BP Location: Left Arm, Patient Position: Sitting, Cuff Size: Small)   Pulse 78   Temp 97.9 F (36.6 C) (Oral)   Ht 5\' 4"  (1.626 m)   Wt 168 lb (76.2 kg)   SpO2 94%   BMI 28.84 kg/m  Wt Readings from Last 3 Encounters:  05/24/19 168 lb (76.2 kg)  05/05/19 165 lb (74.8 kg)  03/05/19 164 lb (74.4 kg)     Health Maintenance Due  Topic Date Due  . INFLUENZA VACCINE  05/22/2019    There are no preventive care reminders to display for this patient.  Lab Results  Component Value Date   TSH 0.668 06/21/2016   Lab Results  Component Value Date   WBC 9.3 12/08/2018   HGB 7.7 (L) 12/08/2018    HCT 24.4 (L) 12/08/2018   MCV 88.7 12/08/2018   PLT 811 (H) 12/08/2018   Lab Results  Component Value Date   NA 137 11/30/2018   K 4.2 11/30/2018   CO2 24 11/30/2018   GLUCOSE 88 11/30/2018   BUN 10 11/30/2018   CREATININE 0.67 11/30/2018   BILITOT 2.3 (H) 11/30/2018   ALKPHOS 64 11/30/2018   AST 33 11/30/2018   ALT 25 11/30/2018   PROT 7.7 11/30/2018   ALBUMIN 4.4 11/30/2018   CALCIUM 8.9 11/30/2018   ANIONGAP 10 11/30/2018   Lab Results  Component Value Date   CHOL 128 09/07/2016   Lab Results  Component Value Date   HDL 26 (L) 09/07/2016   Lab Results  Component Value Date   LDLCALC 60 09/07/2016   Lab Results  Component Value Date   TRIG 211 (H) 09/07/2016   Lab Results  Component Value Date   CHOLHDL 4.9 09/07/2016   Lab Results  Component Value Date   HGBA1C <4.2 (L) 09/07/2016     Assessment & Plan:   1. Swollen lymph nodes Left axilla swollen glands palpated. We will continue to monitor.    2. Hb-SS disease without crisis [facility](HCC) She is doing well today. We will discontinue Oxycodone 15 mg today. We will reinitiate Oxycodone 10 mg today. She will continue to take pain medications as prescribed; will continue to avoid extreme heat and cold; will continue to eat a healthy diet and drink at least 64 ounces of water daily; continue stool softener as needed; will avoid colds and flu; will continue to get plenty of sleep and rest; will continue to avoid high stressful situations and remain infection free; will continue Folic Acid 1 mg daily to avoid sickle cell crisis.  - Oxycodone HCl 10 MG TABS; Take 1 tablet (10 mg total) by mouth every 4 (four) hours as needed for up to 15 days (pain).  Dispense: 90 tablet; Refill: 0 - OXYCONTIN 30 MG 12 hr  tablet; Take 1 tablet (30 mg total) by mouth 2 (two) times daily.  Dispense: 60 tablet; Refill: 0  3. Chronic pain syndrome - Oxycodone HCl 10 MG TABS; Take 1 tablet (10 mg total) by mouth every 4 (four) hours as  needed for up to 15 days (pain).  Dispense: 90 tablet; Refill: 0 - OXYCONTIN 30 MG 12 hr tablet; Take 1 tablet (30 mg total) by mouth 2 (two) times daily.  Dispense: 60 tablet; Refill: 0  4. Chronic, continuous use of opioids - Oxycodone HCl 10 MG TABS; Take 1 tablet (10 mg total) by mouth every 4 (four) hours as needed for up to 15 days (pain).  Dispense: 90 tablet; Refill: 0 - OXYCONTIN 30 MG 12 hr tablet; Take 1 tablet (30 mg total) by mouth 2 (two) times daily.  Dispense: 60 tablet; Refill: 0  5. Medication management - Oxycodone HCl 10 MG TABS; Take 1 tablet (10 mg total) by mouth every 4 (four) hours as needed for up to 15 days (pain).  Dispense: 90 tablet; Refill: 0  6. Insomnia, unspecified type Stable.   7. Nausea Stable.   8. Follow up She will follow up in 1 month.   Meds ordered this encounter  Medications  . Oxycodone HCl 10 MG TABS    Sig: Take 1 tablet (10 mg total) by mouth every 4 (four) hours as needed for up to 15 days (pain).    Dispense:  90 tablet    Refill:  0    Dose Change! Patient unable to tolerate Oxy-IR 15 mg, please discontinue. Decreased to 10 mg on 05/24/2019. Thank you.    Order Specific Question:   Supervising Provider    Answer:   Quentin Angst [id number]  . OXYCONTIN 30 MG 12 hr tablet    Sig: Take 1 tablet (30 mg total) by mouth 2 (two) times daily.    Dispense:  60 tablet    Refill:  0    Order Specific Question:   Supervising Provider    Answer:   Quentin Angst [id number]   No orders of the defined types were placed in this encounter.  Referral Orders  No referral(s) requested today    Raliegh Ip,  MSN, FNP-BC [facility] [address] [place], [place] [address] [phone] fax   Problem List Items Addressed This Visit      Other   Chronic pain syndrome   Relevant Medications   Oxycodone HCl 10 MG TABS   OXYCONTIN 30 MG 12 hr  tablet   Chronic, continuous use of opioids   Relevant Medications   Oxycodone HCl 10 MG TABS   OXYCONTIN 30 MG 12 hr tablet   Hb-SS disease without crisis (HCC)   Relevant Medications   Oxycodone HCl 10 MG TABS   OXYCONTIN 30 MG 12 hr tablet    Other Visit Diagnoses    Swollen lymph nodes    -  Primary   Medication management       Relevant Medications   Oxycodone HCl 10 MG TABS   Insomnia, unspecified type       Nausea       Follow up          Meds ordered this encounter  Medications  . Oxycodone HCl 10 MG TABS    Sig: Take 1 tablet (10 mg total) by mouth every 4 (four) hours as needed for up to 15 days (  pain).    Dispense:  90 tablet    Refill:  0    Dose Change! Patient unable to tolerate Oxy-IR 15 mg, please discontinue. Decreased to 10 mg on 05/24/2019. Thank you.    Order Specific Question:   Supervising Provider    Answer:   Quentin AngstJEGEDE, OLUGBEMIGA E [id number]  . OXYCONTIN 30 MG 12 hr tablet    Sig: Take 1 tablet (30 mg total) by mouth 2 (two) times daily.    Dispense:  60 tablet    Refill:  0    Order Specific Question:   Supervising Provider    Answer:   Quentin AngstJEGEDE, OLUGBEMIGA E [[id number]]    Follow-up: Return in about 1 month (around 06/24/2019).    Kallie LocksNatalie M Kyeshia Zinn, FNP

## 2019-05-31 NOTE — Telephone Encounter (Signed)
-----   Message from [name], [place] sent at 05/31/2019  8:13 AM EDT ----- Regarding: "MRI" Patient is scheduled for repeat abdominal MRI, at [facility], but would like to have scan done here at [facility]. Could you please contact Hematologist (Nurse) at [facility] to see if we can order it here at our facility? Thank you.

## 2019-05-31 NOTE — Telephone Encounter (Signed)
Left a vm for Hematology Clinic to give me a call back regarding MRI

## 2019-06-02 ENCOUNTER — Telehealth: Payer: Self-pay

## 2019-06-03 ENCOUNTER — Other Ambulatory Visit: Payer: Self-pay | Admitting: Family Medicine

## 2019-06-03 ENCOUNTER — Encounter: Payer: Self-pay | Admitting: Family Medicine

## 2019-06-03 DIAGNOSIS — R519 Headache, unspecified: Secondary | ICD-10-CM

## 2019-06-03 MED ORDER — TOPIRAMATE 25 MG PO TABS: 25.0000 mg | ORAL_TABLET | Freq: Two times a day (BID) | ORAL | 2 refills | Status: DC | PRN

## 2019-06-03 MED FILL — TOPIRAMATE 25 MG TABLET: 25 | 15 days supply | Qty: 30 | Fill #0

## 2019-06-03 NOTE — Telephone Encounter (Signed)
Left a detail vm  

## 2019-06-03 NOTE — Telephone Encounter (Signed)
Patient states that she has been having headaches daily and would like to have something sent into the pharmacy. Patient is aware that you are out of office

## 2019-06-03 NOTE — Telephone Encounter (Signed)
Patient wants to know if you can just put the order in for the MRI. I tried to contact [facility] but I haven't been able to get in touch with anybody

## 2019-06-04 NOTE — Telephone Encounter (Signed)
Patient notified

## 2019-06-08 DIAGNOSIS — D571 Sickle-cell disease without crisis: Secondary | ICD-10-CM | POA: Diagnosis not present

## 2019-06-08 DIAGNOSIS — Z7689 Persons encountering health services in other specified circumstances: Secondary | ICD-10-CM | POA: Diagnosis not present

## 2019-06-09 DIAGNOSIS — D571 Sickle-cell disease without crisis: Secondary | ICD-10-CM | POA: Diagnosis not present

## 2019-06-09 DIAGNOSIS — Z7689 Persons encountering health services in other specified circumstances: Secondary | ICD-10-CM | POA: Diagnosis not present

## 2019-06-09 MED FILL — DEFERASIROX 360 MG TABS: 360 | 30 days supply | Qty: 60 | Fill #1

## 2019-06-12 ENCOUNTER — Other Ambulatory Visit: Payer: Self-pay | Admitting: Family Medicine

## 2019-06-12 DIAGNOSIS — G4700 Insomnia, unspecified: Secondary | ICD-10-CM

## 2019-06-14 ENCOUNTER — Other Ambulatory Visit: Payer: Self-pay

## 2019-06-14 DIAGNOSIS — G4700 Insomnia, unspecified: Secondary | ICD-10-CM

## 2019-06-14 NOTE — Telephone Encounter (Signed)
Refill request for zolpidem. Please advise. Thanks!  °

## 2019-06-15 ENCOUNTER — Other Ambulatory Visit: Payer: Self-pay | Admitting: Family Medicine

## 2019-06-15 DIAGNOSIS — G4700 Insomnia, unspecified: Secondary | ICD-10-CM

## 2019-06-15 MED ORDER — ZOLPIDEM TARTRATE 5 MG PO TABS: 5.0000 mg | ORAL_TABLET | Freq: Every evening | ORAL | 0 refills | Status: DC | PRN

## 2019-06-15 MED FILL — ZOLPIDEM TARTRATE 5 MG TAB: 5 | 30 days supply | Qty: 30 | Fill #0

## 2019-06-22 ENCOUNTER — Telehealth: Payer: Self-pay

## 2019-06-22 ENCOUNTER — Other Ambulatory Visit: Payer: Self-pay | Admitting: Family Medicine

## 2019-06-22 DIAGNOSIS — Z79899 Other long term (current) drug therapy: Secondary | ICD-10-CM

## 2019-06-22 DIAGNOSIS — T50905A Adverse effect of unspecified drugs, medicaments and biological substances, initial encounter: Secondary | ICD-10-CM

## 2019-06-22 DIAGNOSIS — G894 Chronic pain syndrome: Secondary | ICD-10-CM

## 2019-06-22 DIAGNOSIS — D571 Sickle-cell disease without crisis: Secondary | ICD-10-CM

## 2019-06-22 DIAGNOSIS — L298 Other pruritus: Secondary | ICD-10-CM

## 2019-06-22 DIAGNOSIS — R110 Nausea: Secondary | ICD-10-CM

## 2019-06-22 DIAGNOSIS — F1190 Opioid use, unspecified, uncomplicated: Secondary | ICD-10-CM

## 2019-06-22 MED ORDER — OXYCONTIN 30 MG PO T12A: 1.0000 | EXTENDED_RELEASE_TABLET | Freq: Two times a day (BID) | ORAL | 0 refills | Status: DC

## 2019-06-22 MED ORDER — PROMETHAZINE HCL 25 MG PO TABS: 25.0000 mg | ORAL_TABLET | Freq: Three times a day (TID) | ORAL | 2 refills | Status: DC | PRN

## 2019-06-22 MED ORDER — HYDROXYZINE HCL 25 MG PO TABS: 12.5000 mg | ORAL_TABLET | Freq: Three times a day (TID) | ORAL | 6 refills | Status: DC | PRN

## 2019-06-22 MED ORDER — OXYCODONE HCL 10 MG PO TABS: 10.0000 mg | ORAL_TABLET | ORAL | 0 refills | Status: DC | PRN

## 2019-06-22 MED FILL — hydrOXYzine HCL 25 MG TABS: 25 | 20 days supply | Qty: 60 | Fill #0

## 2019-06-22 MED FILL — PROMETHAZINE 25 MG TABLET: 25 | 10 days supply | Qty: 30 | Fill #0

## 2019-06-22 NOTE — Telephone Encounter (Signed)
Called and l/m on pt voicemail about refill that sent to pharmacy.

## 2019-06-24 MED FILL — oxyCODONE HCL 10 MG TABS: 10 | 15 days supply | Qty: 90 | Fill #0

## 2019-06-25 MED FILL — LIDOCAINE-PRILOCAINE CREAM: 2.5-2.5 | 10 days supply | Qty: 30 | Fill #3

## 2019-06-30 MED FILL — OxyCONTIN 30 MG T12A: 30 | 30 days supply | Qty: 60 | Fill #0

## 2019-07-02 ENCOUNTER — Encounter: Payer: Self-pay | Admitting: Family Medicine

## 2019-07-02 ENCOUNTER — Other Ambulatory Visit: Payer: Self-pay

## 2019-07-02 ENCOUNTER — Ambulatory Visit (INDEPENDENT_AMBULATORY_CARE_PROVIDER_SITE_OTHER): Payer: Medicaid Other | Admitting: Family Medicine

## 2019-07-02 VITALS — BP 113/81 | HR 75 | Temp 98.2°F | Ht 64.0 in | Wt 168.6 lb

## 2019-07-02 DIAGNOSIS — D571 Sickle-cell disease without crisis: Secondary | ICD-10-CM

## 2019-07-02 DIAGNOSIS — F1190 Opioid use, unspecified, uncomplicated: Secondary | ICD-10-CM

## 2019-07-02 DIAGNOSIS — G4700 Insomnia, unspecified: Secondary | ICD-10-CM

## 2019-07-02 DIAGNOSIS — Z09 Encounter for follow-up examination after completed treatment for conditions other than malignant neoplasm: Secondary | ICD-10-CM

## 2019-07-02 DIAGNOSIS — Z23 Encounter for immunization: Secondary | ICD-10-CM | POA: Diagnosis not present

## 2019-07-02 DIAGNOSIS — G894 Chronic pain syndrome: Secondary | ICD-10-CM | POA: Diagnosis not present

## 2019-07-02 DIAGNOSIS — R110 Nausea: Secondary | ICD-10-CM | POA: Insufficient documentation

## 2019-07-02 DIAGNOSIS — R0602 Shortness of breath: Secondary | ICD-10-CM

## 2019-07-02 LAB — POCT URINALYSIS DIPSTICK
Bilirubin, UA: NEGATIVE
Glucose, UA: NEGATIVE
Ketones, UA: NEGATIVE
Leukocytes, UA: NEGATIVE
Nitrite, UA: NEGATIVE
Protein, UA: NEGATIVE
Spec Grav, UA: 1.02 (ref 1.010–1.025)
Urobilinogen, UA: 2 E.U./dL — AB
pH, UA: 5.5 (ref 5.0–8.0)

## 2019-07-02 MED ORDER — LEVETIRACETAM 1000 MG PO TABS: 1000.0000 mg | ORAL_TABLET | Freq: Two times a day (BID) | ORAL | 3 refills | Status: DC

## 2019-07-02 MED ORDER — LAMOTRIGINE 200 MG PO TABS: ORAL_TABLET | ORAL | 3 refills | Status: DC

## 2019-07-02 MED ORDER — ALBUTEROL SULFATE HFA 108 (90 BASE) MCG/ACT IN AERS: INHALATION_SPRAY | RESPIRATORY_TRACT | 11 refills | Status: DC

## 2019-07-02 MED FILL — ALBUTEROL SULFATE HFA 108 (: 108 (90 BAS | 17 days supply | Qty: 9 | Fill #2

## 2019-07-02 MED FILL — SUBVENITE 200 MG TABS: 200 | 90 days supply | Qty: 180 | Fill #0

## 2019-07-02 MED FILL — TOPIRAMATE 25 MG TABLET: 25 | 15 days supply | Qty: 30 | Fill #1

## 2019-07-02 MED FILL — levETIRAcetam 1000 MG TABS: 1000 | 90 days supply | Qty: 180 | Fill #0

## 2019-07-02 NOTE — Progress Notes (Signed)
[facility]   Established Patient Office Visit  Subjective:  Patient ID: Debra Williamson, female    DOB: 06-Jul-1981  Age: 38 y.o. MRN: [id number]  CC:  Chief Complaint  Patient presents with  . Follow-up    3 month follow up , pt has no other concerns     HPI Debra Williamson is a 38 year old female who presents for Follow Up today.   Past Medical History:  Diagnosis Date  . Anemia   . Blood transfusion without reported diagnosis   . Migraine 05/2019  . Seizures (HCC)    one time incident, may have been r/t to a pain medication she was prescribed  . Sickle cell disease (HCC)   . Stroke [facility])    Current Status: Since her last office visit, she is doing well with no complaints. She states that she has pain in her arms, lower back, and legs. She rates her pain today at 6/10. She has not had a hospital visit for Sickle Cell Crisis since 11/25/2018 where she was treated and discharged the same day. She is currently taking all medications as prescribed and staying well hydrated. She reports occasional nausea, constipation, dizziness and headaches.   She denies fevers, chills, fatigue, recent infections, weight loss, and night sweats. She has not had any visual changes, and falls. No chest pain, heart palpitations, cough and shortness of breath reported. No reports of GI problems such as vomiting, and diarrhea. She has no reports of blood in stools, dysuria and hematuria. No depression or anxiety reported today.   Past Surgical History:  Procedure Laterality Date  . CESAREAN SECTION     x4  . CHOLECYSTECTOMY    . PORTACATH PLACEMENT Right w-3  . reverse tubal ligation    . TEE WITHOUT CARDIOVERSION N/A 09/10/2016   Procedure: TRANSESOPHAGEAL ECHOCARDIOGRAM (TEE);  Surgeon: Thurmon Fair, MD;  Location: [facility] ENDOSCOPY;  Service: Cardiovascular;  Laterality: N/A;    Family History  Problem Relation Age of Onset  . HIV/AIDS Father      Social History   Socioeconomic History  . Marital status: Married    Spouse name: Not on file  . Number of children: 4  . Years of education: Not on file  . Highest education level: Not on file  Occupational History  . Occupation: Doesn't work  Engineer, production  . Financial resource strain: Not on file  . Food insecurity    Worry: Not on file    Inability: Not on file  . Transportation needs    Medical: Not on file    Non-medical: Not on file  Tobacco Use  . Smoking status: Never Smoker  . Smokeless tobacco: Never Used  Substance and Sexual Activity  . Alcohol use: No    Alcohol/week: 0.0 standard drinks    Comment: occasionally  . Drug use: No  . Sexual activity: Yes  Lifestyle  . Physical activity    Days per week: Not on file    Minutes per session: Not on file  . Stress: Not on file  Relationships  . Social Musician on phone: Not on file    Gets together: Not on file    Attends religious service: Not on file    Active member of club or organization: Not on file    Attends meetings of clubs or organizations: Not on file    Relationship status: Not on file  . Intimate partner violence  Fear of current or ex partner: Not on file    Emotionally abused: Not on file    Physically abused: Not on file    Forced sexual activity: Not on file  Other Topics Concern  . Not on file  Social History Narrative  . Not on file    Outpatient Medications Prior to Visit  Medication Sig Dispense Refill  . aspirin EC 81 MG tablet Take 1 tablet by mouth every evening.     . cetirizine (ZYRTEC) 10 MG tablet Take 1 tablet (10 mg total) by mouth daily. 30 tablet 11  . diclofenac sodium (VOLTAREN) 1 % GEL Apply 2 g topically 4 (four) times daily. 100 g 1  . folic acid (FOLVITE) 1 MG tablet Take 1 mg by mouth daily.     . hydrOXYzine (ATARAX/VISTARIL) 25 MG tablet Take 0.5-1 tablets (12.5-25 mg total) by mouth every 8 (eight) hours as needed for itching. 60 tablet 6  .  lidocaine-prilocaine (EMLA) cream APPLY TO THE AFFECTED AREA(S) AS NEEDED 30 g 3  . lidocaine-prilocaine (EMLA) cream Apply 1 application topically as needed. 30 g 3  . omeprazole (PRILOSEC) 40 MG capsule Take 1 capsule (40 mg total) by mouth daily. 30 capsule 6  . Oxycodone HCl 10 MG TABS Take 1 tablet (10 mg total) by mouth every 4 (four) hours as needed for up to 15 days (pain). 90 tablet 0  . OXYCONTIN 30 MG 12 hr tablet Take 1 tablet (30 mg total) by mouth 2 (two) times daily. 60 tablet 0  . promethazine (PHENERGAN) 25 MG tablet Take 1 tablet (25 mg total) by mouth every 8 (eight) hours as needed for nausea or vomiting. 1 tablet every 8 hours prn 30 tablet 2  . zolpidem (AMBIEN) 5 MG tablet Take 1 tablet (5 mg total) by mouth at bedtime as needed. sleep 30 tablet 0  . albuterol (PROAIR HFA) 108 (90 Base) MCG/ACT inhaler INHALE 2 PUFFS INTO THE LUNGS EVERY 4 (FOUR) HOURS AS NEEDED FOR WHEEZING OR SHORTNESS OF BREATH (COUGH, SHORTNESS OF BREATH OR WHEEZING.). 8.5 g 11  . levETIRAcetam (KEPPRA) 1000 MG tablet TAKE 1 TABLET BY MOUTH 2 TIMES DAILY. 180 tablet 3  . Deferasirox (JADENU) 360 MG TABS Take 1 tablet (360 mg total) by mouth 2 (two) times daily. 60 tablet 3  . meloxicam (MOBIC) 15 MG tablet Take 1 tablet (15 mg total) by mouth daily. (Patient not taking: Reported on 07/02/2019) 30 tablet 3  . topiramate (TOPAMAX) 25 MG tablet Take 1 tablet (25 mg total) by mouth 2 (two) times daily as needed. (Patient not taking: Reported on 07/02/2019) 30 tablet 2  . lamoTRIgine (LAMICTAL) 200 MG tablet TAKE 1 TABLET (200 MG TOTAL) BY MOUTH 2 TIMES DAILY. 180 tablet 3   No facility-administered medications prior to visit.     Allergies  Allergen Reactions  . Ibuprofen Hives  . Zofran [Ondansetron Hcl] Hives and Itching  . Demerol [Meperidine] Other (See Comments)    Pt has seizures  . Fentanyl And Related Nausea And Vomiting and Other (See Comments)    Very sedated  **Not allergic to the injection**  JUST ALLERGIC TO PATCH  . Other Itching    Greek Yogurt  . Codeine Hives and Itching  . Hydrocodone Hives and Itching    ROS Review of Systems  Constitutional: Negative.   HENT: Negative.   Eyes: Negative.   Respiratory: Positive for shortness of breath (occasionally ).   Cardiovascular: Negative.   Gastrointestinal:  Positive for constipation (occasional ) and nausea (occasional).  Endocrine: Negative.   Genitourinary: Negative.   Musculoskeletal: Positive for arthralgias (generalized joint pain).  Skin: Negative.   Allergic/Immunologic: Negative.   Neurological: Positive for dizziness (occasional ) and headaches (occasional ).  Hematological: Negative.   Psychiatric/Behavioral: Negative.    Objective:    Physical Exam  Constitutional: She is oriented to person, place, and time. She appears well-developed and well-nourished.  HENT:  Head: Normocephalic and atraumatic.  Eyes: Conjunctivae are normal.  Neck: Normal range of motion. Neck supple.  Cardiovascular: Normal rate, regular rhythm, normal heart sounds and intact distal pulses.  Pulmonary/Chest: Effort normal and breath sounds normal.  Abdominal: Soft. Bowel sounds are normal. She exhibits distension (obese).  Musculoskeletal:     Comments: Limited ROM in extremities  Neurological: She is alert and oriented to person, place, and time. She has normal reflexes.  Skin: Skin is warm and dry.  Psychiatric: She has a normal mood and affect. Her behavior is normal. Judgment and thought content normal.  Nursing note and vitals reviewed.   BP 113/81 (BP Location: Left Arm, Patient Position: Sitting, Cuff Size: Normal)   Pulse 75   Temp 98.2 F (36.8 C) (Oral)   Ht 5\' 4"  (1.626 m)   Wt 168 lb 9.6 oz (76.5 kg)   SpO2 95%   BMI 28.94 kg/m  Wt Readings from Last 3 Encounters:  07/02/19 168 lb 9.6 oz (76.5 kg)  05/24/19 168 lb (76.2 kg)  05/05/19 165 lb (74.8 kg)     Health Maintenance Due  Topic Date Due  .  INFLUENZA VACCINE  05/22/2019    There are no preventive care reminders to display for this patient.  Lab Results  Component Value Date   TSH 0.668 06/21/2016   Lab Results  Component Value Date   WBC 9.3 12/08/2018   HGB 7.7 (L) 12/08/2018   HCT 24.4 (L) 12/08/2018   MCV 88.7 12/08/2018   PLT 811 (H) 12/08/2018   Lab Results  Component Value Date   NA 137 11/30/2018   K 4.2 11/30/2018   CO2 24 11/30/2018   GLUCOSE 88 11/30/2018   BUN 10 11/30/2018   CREATININE 0.67 11/30/2018   BILITOT 2.3 (H) 11/30/2018   ALKPHOS 64 11/30/2018   AST 33 11/30/2018   ALT 25 11/30/2018   PROT 7.7 11/30/2018   ALBUMIN 4.4 11/30/2018   CALCIUM 8.9 11/30/2018   ANIONGAP 10 11/30/2018   Lab Results  Component Value Date   CHOL 128 09/07/2016   Lab Results  Component Value Date   HDL 26 (L) 09/07/2016   Lab Results  Component Value Date   LDLCALC 60 09/07/2016   Lab Results  Component Value Date   TRIG 211 (H) 09/07/2016   Lab Results  Component Value Date   CHOLHDL 4.9 09/07/2016   Lab Results  Component Value Date   HGBA1C <4.2 (L) 09/07/2016      Assessment & Plan:   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 She will continue to take pain medications as prescribed; will continue to avoid extreme heat and cold; will continue to eat a healthy diet and drink at least 64 ounces of water daily; continue stool softener as needed; will avoid colds and flu; will continue to get plenty of sleep and rest; will continue to avoid high stressful situations and remain infection free; will continue Folic Acid 1 mg daily to avoid sickle cell crisis.  2. Chronic pain syndrome  3. Chronic, continuous use of opioids  4. Nausea Occasional. Continue Zofran as needed.   5. Insomnia, unspecified type Stable.   6. Shortness of breath Stable. No signs or symptoms of respiratory distress noted or reported today.  - albuterol (PROAIR HFA) 108 (90 Base) MCG/ACT  inhaler; INHALE 2 PUFFS INTO THE LUNGS EVERY 4 (FOUR) HOURS AS NEEDED FOR WHEEZING OR SHORTNESS OF BREATH (COUGH, SHORTNESS OF BREATH OR WHEEZING.).  Dispense: 8.5 g; Refill: 11  7. Need for immunization against influenza - POCT Urinalysis Dipstick  8. Follow up She will follow up in 2 months.   Meds ordered this encounter  Medications  . albuterol (PROAIR HFA) 108 (90 Base) MCG/ACT inhaler    Sig: INHALE 2 PUFFS INTO THE LUNGS EVERY 4 (FOUR) HOURS AS NEEDED FOR WHEEZING OR SHORTNESS OF BREATH (COUGH, SHORTNESS OF BREATH OR WHEEZING.).    Dispense:  8.5 g    Refill:  11  . levETIRAcetam (KEPPRA) 1000 MG tablet    Sig: Take 1 tablet (1,000 mg total) by mouth 2 (two) times daily.    Dispense:  180 tablet    Refill:  3  . lamoTRIgine (LAMICTAL) 200 MG tablet    Sig: TAKE 1 TABLET (200 MG TOTAL) BY MOUTH 2 TIMES DAILY.    Dispense:  180 tablet    Refill:  3    Orders Placed This Encounter  Procedures  . Flu Vaccine QUAD 6+ mos PF IM (Fluarix Quad PF)  . POCT Urinalysis Dipstick    Kathe Becton,  MSN, FNP-BC [facility] [place], [place] [address] [phone] fax  Problem List Items Addressed This Visit      Other   Chronic pain syndrome   Relevant Medications   levETIRAcetam (KEPPRA) 1000 MG tablet   lamoTRIgine (LAMICTAL) 200 MG tablet   Chronic, continuous use of opioids   Dyspnea   Relevant Medications   albuterol (PROAIR HFA) 108 (90 Base) MCG/ACT inhaler   Hb-SS disease without crisis ([place]) - Primary    Other Visit Diagnoses    Nausea       Insomnia, unspecified type       Need for immunization against influenza       Relevant Orders   POCT Urinalysis Dipstick   Follow up          Meds ordered this encounter  Medications  . albuterol (PROAIR HFA) 108 (90 Base) MCG/ACT inhaler    Sig: INHALE 2 PUFFS INTO THE LUNGS EVERY 4 (FOUR) HOURS AS NEEDED FOR WHEEZING  OR SHORTNESS OF BREATH (COUGH, SHORTNESS OF BREATH OR WHEEZING.).    Dispense:  8.5 g    Refill:  11  . levETIRAcetam (KEPPRA) 1000 MG tablet    Sig: Take 1 tablet (1,000 mg total) by mouth 2 (two) times daily.    Dispense:  180 tablet    Refill:  3  . lamoTRIgine (LAMICTAL) 200 MG tablet    Sig: TAKE 1 TABLET (200 MG TOTAL) BY MOUTH 2 TIMES DAILY.    Dispense:  180 tablet    Refill:  3    Follow-up: Return in about 2 months (around 09/01/2019).    [name], FNP

## 2019-07-06 ENCOUNTER — Ambulatory Visit: Payer: Medicaid Other | Admitting: Family Medicine

## 2019-07-06 DIAGNOSIS — R160 Hepatomegaly, not elsewhere classified: Secondary | ICD-10-CM | POA: Diagnosis not present

## 2019-07-06 DIAGNOSIS — G9389 Other specified disorders of brain: Secondary | ICD-10-CM | POA: Diagnosis not present

## 2019-07-06 DIAGNOSIS — D571 Sickle-cell disease without crisis: Secondary | ICD-10-CM | POA: Diagnosis not present

## 2019-07-06 DIAGNOSIS — Z8673 Personal history of transient ischemic attack (TIA), and cerebral infarction without residual deficits: Secondary | ICD-10-CM | POA: Diagnosis not present

## 2019-07-07 DIAGNOSIS — D571 Sickle-cell disease without crisis: Secondary | ICD-10-CM | POA: Diagnosis not present

## 2019-07-07 DIAGNOSIS — Z7689 Persons encountering health services in other specified circumstances: Secondary | ICD-10-CM | POA: Diagnosis not present

## 2019-07-08 MED FILL — DEFERASIROX 360 MG TABS: 360 | 30 days supply | Qty: 60 | Fill #2

## 2019-07-13 ENCOUNTER — Other Ambulatory Visit: Payer: Self-pay | Admitting: Family Medicine

## 2019-07-13 DIAGNOSIS — G4700 Insomnia, unspecified: Secondary | ICD-10-CM

## 2019-07-13 MED ORDER — ZOLPIDEM TARTRATE 5 MG PO TABS: 5.0000 mg | ORAL_TABLET | Freq: Every evening | ORAL | 0 refills | Status: DC | PRN

## 2019-07-13 NOTE — Telephone Encounter (Signed)
Refill request for zolpidem. Please advise. Thanks!  °

## 2019-07-15 MED FILL — ZOLPIDEM TARTRATE 5 MG TAB: 5 | 30 days supply | Qty: 30 | Fill #0

## 2019-07-20 ENCOUNTER — Telehealth: Payer: Self-pay | Admitting: Family Medicine

## 2019-07-20 NOTE — Telephone Encounter (Signed)
Pt called she said that her left eye is  Swollen , red , itchy, and some watery eye pt stated that this started yesterday, I informed pt to use warm compress or cold compress to see if helps. Pt wanted to know if she have something sent into the pharmacy .

## 2019-07-22 ENCOUNTER — Other Ambulatory Visit: Payer: Self-pay | Admitting: Family Medicine

## 2019-07-22 ENCOUNTER — Telehealth: Payer: Self-pay | Admitting: Family Medicine

## 2019-07-22 DIAGNOSIS — F1190 Opioid use, unspecified, uncomplicated: Secondary | ICD-10-CM

## 2019-07-22 DIAGNOSIS — R110 Nausea: Secondary | ICD-10-CM

## 2019-07-22 DIAGNOSIS — G4700 Insomnia, unspecified: Secondary | ICD-10-CM

## 2019-07-22 DIAGNOSIS — D571 Sickle-cell disease without crisis: Secondary | ICD-10-CM

## 2019-07-22 DIAGNOSIS — Z79899 Other long term (current) drug therapy: Secondary | ICD-10-CM

## 2019-07-22 DIAGNOSIS — G894 Chronic pain syndrome: Secondary | ICD-10-CM

## 2019-07-22 MED ORDER — PROMETHAZINE HCL 25 MG PO TABS: 25.0000 mg | ORAL_TABLET | Freq: Three times a day (TID) | ORAL | 2 refills | Status: DC | PRN

## 2019-07-22 MED ORDER — OXYCODONE HCL 10 MG PO TABS: 10.0000 mg | ORAL_TABLET | ORAL | 0 refills | Status: DC | PRN

## 2019-07-22 MED ORDER — OXYCONTIN 30 MG PO T12A: 1.0000 | EXTENDED_RELEASE_TABLET | Freq: Two times a day (BID) | ORAL | 0 refills | Status: DC

## 2019-07-23 MED FILL — hydrOXYzine HCL 25 MG TABS: 25 | 20 days supply | Qty: 60 | Fill #1

## 2019-07-23 MED FILL — PROMETHAZINE 25 MG TABLET: 25 | 10 days supply | Qty: 30 | Fill #0

## 2019-07-23 MED FILL — oxyCODONE HCL 10 MG TABS: 10 | 15 days supply | Qty: 90 | Fill #0

## 2019-07-23 NOTE — Telephone Encounter (Signed)
Called & informed pt.

## 2019-07-30 MED FILL — OxyCONTIN 30 MG T12A: 30 | 30 days supply | Qty: 60 | Fill #0

## 2019-08-03 DIAGNOSIS — D571 Sickle-cell disease without crisis: Secondary | ICD-10-CM | POA: Diagnosis not present

## 2019-08-04 DIAGNOSIS — D571 Sickle-cell disease without crisis: Secondary | ICD-10-CM | POA: Diagnosis not present

## 2019-08-09 MED FILL — TOPIRAMATE 25 MG TABLET: 25 | 15 days supply | Qty: 30 | Fill #2

## 2019-08-10 ENCOUNTER — Telehealth: Payer: Self-pay | Admitting: Family Medicine

## 2019-08-10 ENCOUNTER — Other Ambulatory Visit: Payer: Self-pay | Admitting: Family Medicine

## 2019-08-10 DIAGNOSIS — G4700 Insomnia, unspecified: Secondary | ICD-10-CM

## 2019-08-10 MED ORDER — ZOLPIDEM TARTRATE 5 MG PO TABS: 5.0000 mg | ORAL_TABLET | Freq: Every evening | ORAL | 0 refills | Status: DC | PRN

## 2019-08-11 ENCOUNTER — Other Ambulatory Visit: Payer: Self-pay

## 2019-08-11 MED ORDER — DEFERASIROX 360 MG PO TABS: 360.0000 mg | ORAL_TABLET | Freq: Two times a day (BID) | ORAL | 3 refills | Status: DC

## 2019-08-11 MED FILL — DEFERASIROX 360 MG TABS: 360 | 30 days supply | Qty: 60 | Fill #0

## 2019-08-11 NOTE — Telephone Encounter (Signed)
done

## 2019-08-14 MED FILL — ZOLPIDEM TARTRATE 5 MG TAB: 5 | 30 days supply | Qty: 30 | Fill #0

## 2019-08-20 ENCOUNTER — Telehealth: Payer: Self-pay | Admitting: Family Medicine

## 2019-08-20 ENCOUNTER — Other Ambulatory Visit: Payer: Self-pay | Admitting: Family Medicine

## 2019-08-20 DIAGNOSIS — Z79899 Other long term (current) drug therapy: Secondary | ICD-10-CM

## 2019-08-20 DIAGNOSIS — D571 Sickle-cell disease without crisis: Secondary | ICD-10-CM

## 2019-08-20 DIAGNOSIS — G894 Chronic pain syndrome: Secondary | ICD-10-CM

## 2019-08-20 DIAGNOSIS — F1190 Opioid use, unspecified, uncomplicated: Secondary | ICD-10-CM

## 2019-08-20 MED ORDER — OXYCODONE HCL 10 MG PO TABS: 10.0000 mg | ORAL_TABLET | ORAL | 0 refills | Status: DC | PRN

## 2019-08-20 MED ORDER — OXYCONTIN 30 MG PO T12A: 1.0000 | EXTENDED_RELEASE_TABLET | Freq: Two times a day (BID) | ORAL | 0 refills | Status: DC

## 2019-08-20 MED FILL — oxyCODONE HCL 10 MG TABS: 10 | 15 days supply | Qty: 90 | Fill #0

## 2019-08-20 NOTE — Telephone Encounter (Signed)
done

## 2019-08-23 ENCOUNTER — Ambulatory Visit (INDEPENDENT_AMBULATORY_CARE_PROVIDER_SITE_OTHER): Payer: Medicaid Other | Admitting: Family Medicine

## 2019-08-23 ENCOUNTER — Encounter: Payer: Self-pay | Admitting: Family Medicine

## 2019-08-23 ENCOUNTER — Other Ambulatory Visit: Payer: Self-pay

## 2019-08-23 VITALS — BP 110/62 | HR 91 | Temp 98.2°F | Ht 64.0 in | Wt 175.0 lb

## 2019-08-23 DIAGNOSIS — F1190 Opioid use, unspecified, uncomplicated: Secondary | ICD-10-CM

## 2019-08-23 DIAGNOSIS — R8290 Unspecified abnormal findings in urine: Secondary | ICD-10-CM | POA: Diagnosis not present

## 2019-08-23 DIAGNOSIS — G894 Chronic pain syndrome: Secondary | ICD-10-CM | POA: Diagnosis not present

## 2019-08-23 DIAGNOSIS — G4700 Insomnia, unspecified: Secondary | ICD-10-CM

## 2019-08-23 DIAGNOSIS — R0602 Shortness of breath: Secondary | ICD-10-CM

## 2019-08-23 DIAGNOSIS — R110 Nausea: Secondary | ICD-10-CM

## 2019-08-23 DIAGNOSIS — Z09 Encounter for follow-up examination after completed treatment for conditions other than malignant neoplasm: Secondary | ICD-10-CM

## 2019-08-23 DIAGNOSIS — D571 Sickle-cell disease without crisis: Secondary | ICD-10-CM | POA: Diagnosis not present

## 2019-08-23 LAB — POCT URINALYSIS DIPSTICK
Bilirubin, UA: NEGATIVE
Blood, UA: NEGATIVE
Glucose, UA: NEGATIVE
Ketones, UA: NEGATIVE
Leukocytes, UA: NEGATIVE
Nitrite, UA: NEGATIVE
Protein, UA: NEGATIVE
Spec Grav, UA: 1.02 (ref 1.010–1.025)
Urobilinogen, UA: 0.2 E.U./dL
pH, UA: 7 (ref 5.0–8.0)

## 2019-08-23 NOTE — Progress Notes (Signed)
[facility]   Established Patient Office Visit  Subjective:  Patient ID: Debra Williamson, female    DOB: [birth year]  Age: 38 y.o. MRN: [id number]  CC:  Chief Complaint  Patient presents with  . Follow-up    Sickle Cell     HPI Debra Williamson is a 38 year old female who presents for Follow Up today.   Past Medical History:  Diagnosis Date  . Anemia   . Blood transfusion without reported diagnosis   . Migraine 05/2019  . Seizures (HCC)    one time incident, may have been r/t to a pain medication she was prescribed  . Sickle cell disease (HCC)   . Stroke [facility])    Current Status: Since her last office visit, she is doing well with no complaints. Since her last office visit, she is doing well with no complaints. She states that she has pain in most joints with recent change of weather. She rates her pain today at 0/10. She has not had a hospital visit for Sickle Cell Crisis since 11/25/2018 where she was treated and discharged the same day. She is currently taking all medications as prescribed and staying well hydrated. She reports occasional nausea, constipation, dizziness and headaches. Today, she is inquiring about community resources on laptops for her kids. She denies fevers, chills, fatigue, recent infections, weight loss, and night sweats. She has not had any headaches, visual changes, dizziness, and falls. No chest pain, heart palpitations, cough and shortness of breath reported. No reports of GI problems such as nausea, vomiting, diarrhea, and constipation. She has no reports of blood in stools, dysuria and hematuria. No depression or anxiety, and denies suicidal ideations, homicidal ideations, or auditory hallucinations. She denies pain today.   Past Surgical History:  Procedure Laterality Date  . CESAREAN SECTION     x4  . CHOLECYSTECTOMY    . PORTACATH PLACEMENT Right w-3  . reverse tubal ligation    . TEE WITHOUT  CARDIOVERSION N/A 09/10/2016   Procedure: TRANSESOPHAGEAL ECHOCARDIOGRAM (TEE);  Surgeon: Thurmon Fair, MD;  Location: [facility] ENDOSCOPY;  Service: Cardiovascular;  Laterality: N/A;    Family History  Problem Relation Age of Onset  . HIV/AIDS Father     Social History   Socioeconomic History  . Marital status: Married    Spouse name: Not on file  . Number of children: 4  . Years of education: Not on file  . Highest education level: Not on file  Occupational History  . Occupation: Doesn't work  Engineer, production  . Financial resource strain: Not on file  . Food insecurity    Worry: Not on file    Inability: Not on file  . Transportation needs    Medical: Not on file    Non-medical: Not on file  Tobacco Use  . Smoking status: Never Smoker  . Smokeless tobacco: Never Used  Substance and Sexual Activity  . Alcohol use: No    Alcohol/week: 0.0 standard drinks    Comment: occasionally  . Drug use: No  . Sexual activity: Yes  Lifestyle  . Physical activity    Days per week: Not on file    Minutes per session: Not on file  . Stress: Not on file  Relationships  . Social Musician on phone: Not on file    Gets together: Not on file    Attends religious service: Not on file    Active member of club  or organization: Not on file    Attends meetings of clubs or organizations: Not on file    Relationship status: Not on file  . Intimate partner violence    Fear of current or ex partner: Not on file    Emotionally abused: Not on file    Physically abused: Not on file    Forced sexual activity: Not on file  Other Topics Concern  . Not on file  Social History Narrative  . Not on file    Outpatient Medications Prior to Visit  Medication Sig Dispense Refill  . albuterol (PROAIR HFA) 108 (90 Base) MCG/ACT inhaler INHALE 2 PUFFS INTO THE LUNGS EVERY 4 (FOUR) HOURS AS NEEDED FOR WHEEZING OR SHORTNESS OF BREATH (COUGH, SHORTNESS OF BREATH OR WHEEZING.). 8.5 g 11  . aspirin EC  81 MG tablet Take 1 tablet by mouth every evening.     . cetirizine (ZYRTEC) 10 MG tablet Take 1 tablet (10 mg total) by mouth daily. 30 tablet 11  . Deferasirox (JADENU) 360 MG TABS Take 1 tablet (360 mg total) by mouth 2 (two) times daily. 60 tablet 3  . diclofenac sodium (VOLTAREN) 1 % GEL Apply 2 g topically 4 (four) times daily. 100 g 1  . folic acid (FOLVITE) 1 MG tablet Take 1 mg by mouth daily.     . hydrOXYzine (ATARAX/VISTARIL) 25 MG tablet Take 0.5-1 tablets (12.5-25 mg total) by mouth every 8 (eight) hours as needed for itching. 60 tablet 6  . lamoTRIgine (LAMICTAL) 200 MG tablet TAKE 1 TABLET (200 MG TOTAL) BY MOUTH 2 TIMES DAILY. 180 tablet 3  . levETIRAcetam (KEPPRA) 1000 MG tablet Take 1 tablet (1,000 mg total) by mouth 2 (two) times daily. 180 tablet 3  . lidocaine-prilocaine (EMLA) cream APPLY TO THE AFFECTED AREA(S) AS NEEDED 30 g 3  . lidocaine-prilocaine (EMLA) cream Apply 1 application topically as needed. 30 g 3  . meloxicam (MOBIC) 15 MG tablet Take 1 tablet (15 mg total) by mouth daily. 30 tablet 3  . Oxycodone HCl 10 MG TABS Take 1 tablet (10 mg total) by mouth every 4 (four) hours as needed for up to 15 days (pain). 90 tablet 0  . [START ON 08/28/2019] OXYCONTIN 30 MG 12 hr tablet Take 1 tablet (30 mg total) by mouth 2 (two) times daily. 60 tablet 0  . promethazine (PHENERGAN) 25 MG tablet Take 1 tablet (25 mg total) by mouth every 8 (eight) hours as needed for nausea or vomiting. 1 tablet every 8 hours prn 30 tablet 2  . topiramate (TOPAMAX) 25 MG tablet Take 1 tablet (25 mg total) by mouth 2 (two) times daily as needed. 30 tablet 2  . zolpidem (AMBIEN) 5 MG tablet Take 1 tablet (5 mg total) by mouth at bedtime as needed. sleep 30 tablet 0  . omeprazole (PRILOSEC) 40 MG capsule Take 1 capsule (40 mg total) by mouth daily. 30 capsule 6   No facility-administered medications prior to visit.     Allergies  Allergen Reactions  . Ibuprofen Hives  . Zofran [Ondansetron  Hcl] Hives and Itching  . Demerol [Meperidine] Other (See Comments)    Pt has seizures  . Fentanyl And Related Nausea And Vomiting and Other (See Comments)    Very sedated  **Not allergic to the injection** JUST ALLERGIC TO PATCH  . Other Itching    Greek Yogurt  . Codeine Hives and Itching  . Hydrocodone Hives and Itching    ROS Review of Systems  Constitutional: Negative.   HENT: Negative.   Eyes: Negative.   Respiratory: Positive for shortness of breath (occasional ).   Cardiovascular: Negative.   Gastrointestinal: Negative.   Endocrine: Negative.   Genitourinary: Negative.   Musculoskeletal: Positive for arthralgias (Generalized joint pain).  Skin: Negative.   Allergic/Immunologic: Negative.   Neurological: Positive for dizziness (occasional ) and headaches (occasional).  Hematological: Negative.   Psychiatric/Behavioral: Negative.       Objective:    Physical Exam  Constitutional: She is oriented to person, place, and time. She appears well-developed and well-nourished.  HENT:  Head: Normocephalic and atraumatic.  Eyes: Conjunctivae are normal.  Neck: Normal range of motion. Neck supple.  Cardiovascular: Normal rate, regular rhythm, normal heart sounds and intact distal pulses.  Pulmonary/Chest: Effort normal.  Abdominal: Soft. Bowel sounds are normal.  Musculoskeletal: Normal range of motion.  Neurological: She is alert and oriented to person, place, and time. She has normal reflexes.  Skin: Skin is warm and dry.  Psychiatric: She has a normal mood and affect. Her behavior is normal. Judgment and thought content normal.  Nursing note and vitals reviewed.   BP 110/62   Pulse 91   Temp 98.2 F (36.8 C)   Ht 5\' 4"  (1.626 m)   Wt 175 lb (79.4 kg) Comment: 173.4lbs  SpO2 92%   BMI 30.04 kg/m  Wt Readings from Last 3 Encounters:  08/23/19 175 lb (79.4 kg)  07/02/19 168 lb 9.6 oz (76.5 kg)  05/24/19 168 lb (76.2 kg)     There are no preventive care  reminders to display for this patient.  There are no preventive care reminders to display for this patient.  Lab Results  Component Value Date   TSH 0.668 06/21/2016   Lab Results  Component Value Date   WBC 9.3 12/08/2018   HGB 7.7 (L) 12/08/2018   HCT 24.4 (L) 12/08/2018   MCV 88.7 12/08/2018   PLT 811 (H) 12/08/2018   Lab Results  Component Value Date   NA 137 11/30/2018   K 4.2 11/30/2018   CO2 24 11/30/2018   GLUCOSE 88 11/30/2018   BUN 10 11/30/2018   CREATININE 0.67 11/30/2018   BILITOT 2.3 (H) 11/30/2018   ALKPHOS 64 11/30/2018   AST 33 11/30/2018   ALT 25 11/30/2018   PROT 7.7 11/30/2018   ALBUMIN 4.4 11/30/2018   CALCIUM 8.9 11/30/2018   ANIONGAP 10 11/30/2018   Lab Results  Component Value Date   CHOL 128 09/07/2016   Lab Results  Component Value Date   HDL 26 (L) 09/07/2016   Lab Results  Component Value Date   LDLCALC 60 09/07/2016   Lab Results  Component Value Date   TRIG 211 (H) 09/07/2016   Lab Results  Component Value Date   CHOLHDL 4.9 09/07/2016   Lab Results  Component Value Date   HGBA1C <4.2 (L) 09/07/2016      Assessment & Plan:   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 She will continue to take pain medications as prescribed; will continue to avoid extreme heat and cold; will continue to eat a healthy diet and drink at least 64 ounces of water daily; continue stool softener as needed; will avoid colds and flu; will continue to get plenty of sleep and rest; will continue to avoid high stressful situations and remain infection free; will continue Folic Acid 1 mg daily to avoid sickle cell crisis. Continue to follow up with Hematologist as needed.  -  POCT urinalysis dipstick  2. Chronic pain syndrome  3. Chronic, continuous use of opioids  4. Increased storage iron She will continue Deferasirox as prescribed.   5. Insomnia, unspecified type  6. Nausea  7. Shortness of breath Stable. No signs or  symptoms or respiratory distress noted or reported today.   8. Abnormal urinalysis Results are pending.  - Urine Culture  9. Follow up She will follow up in 2 months.   No orders of the defined types were placed in this encounter.  Orders Placed This Encounter  Procedures  . Urine Culture  . POCT urinalysis dipstick    Referral Orders  No referral(s) requested today    Kathe Becton,  MSN, FNP-BC [place] [address] [place], [place] [address] [phone] fax   Problem List Items Addressed This Visit      Other   Chronic pain syndrome   Chronic, continuous use of opioids   Dyspnea   Hb-SS disease without crisis ([place]) - Primary   Relevant Orders   POCT urinalysis dipstick (Completed)   Nausea    Other Visit Diagnoses    Increased storage iron       Insomnia, unspecified type       Abnormal urinalysis       Relevant Orders   Urine Culture   Follow up          No orders of the defined types were placed in this encounter.   Follow-up: Return in about 2 months (around 10/23/2019).    [name], FNP

## 2019-08-23 NOTE — Progress Notes (Signed)
[facility] Referral Note  Reason for Referral: Allia Wiltsey is a 38 y.o. female  Pt was referred by NP, Kathe Becton for: community resources   Pt reports the following concerns: needs laptops for her children for online learning  Assessment: Received referral from NP; patient in need of resources for computers for her children to complete their online learning.   Plan: 1. Addressed today: Met with patient during PCP visit and confirmed need for resources. Patient has children in middle and high school and does not have enough laptops for each of the children to sign into their online classes as scheduled and complete their work on time. They have already received one laptop from the middle school. Referred patient to the high school to request another. Also referred patient to another community agency that is assisting in distributing donated laptops to families.   2. Referral: as noted above  3. Follow up: as needed from clinic with additional resources  Estanislado Emms, [place] [phone]

## 2019-08-24 DIAGNOSIS — G4700 Insomnia, unspecified: Secondary | ICD-10-CM | POA: Insufficient documentation

## 2019-08-25 LAB — URINE CULTURE

## 2019-08-27 DIAGNOSIS — D571 Sickle-cell disease without crisis: Secondary | ICD-10-CM | POA: Diagnosis not present

## 2019-08-30 ENCOUNTER — Ambulatory Visit: Payer: Medicaid Other | Admitting: Family Medicine

## 2019-08-31 DIAGNOSIS — D571 Sickle-cell disease without crisis: Secondary | ICD-10-CM | POA: Diagnosis not present

## 2019-09-01 ENCOUNTER — Ambulatory Visit: Payer: Medicaid Other | Admitting: Family Medicine

## 2019-09-01 DIAGNOSIS — D571 Sickle-cell disease without crisis: Secondary | ICD-10-CM | POA: Diagnosis not present

## 2019-09-08 MED FILL — ALBUTEROL SULFATE HFA 108 (: 108 (90 BAS | 17 days supply | Qty: 9 | Fill #3

## 2019-09-13 ENCOUNTER — Other Ambulatory Visit: Payer: Self-pay | Admitting: Family Medicine

## 2019-09-13 ENCOUNTER — Telehealth: Payer: Self-pay | Admitting: Family Medicine

## 2019-09-13 DIAGNOSIS — G4700 Insomnia, unspecified: Secondary | ICD-10-CM

## 2019-09-13 MED ORDER — ZOLPIDEM TARTRATE 5 MG PO TABS: 5.0000 mg | ORAL_TABLET | Freq: Every evening | ORAL | 0 refills | Status: DC | PRN

## 2019-09-13 MED FILL — ZOLPIDEM TARTRATE 5 MG TAB: 5 | 30 days supply | Qty: 30 | Fill #0

## 2019-09-13 MED FILL — DEFERASIROX TAB 360 MG: 360 | 30 days supply | Qty: 60 | Fill #1

## 2019-09-14 NOTE — Telephone Encounter (Signed)
Sent to [facility] 

## 2019-09-21 ENCOUNTER — Telehealth: Payer: Self-pay | Admitting: Family Medicine

## 2019-09-21 ENCOUNTER — Other Ambulatory Visit: Payer: Self-pay | Admitting: Family Medicine

## 2019-09-21 DIAGNOSIS — G894 Chronic pain syndrome: Secondary | ICD-10-CM

## 2019-09-21 DIAGNOSIS — D571 Sickle-cell disease without crisis: Secondary | ICD-10-CM

## 2019-09-21 DIAGNOSIS — F1190 Opioid use, unspecified, uncomplicated: Secondary | ICD-10-CM

## 2019-09-22 ENCOUNTER — Other Ambulatory Visit: Payer: Self-pay | Admitting: Family Medicine

## 2019-09-22 DIAGNOSIS — G894 Chronic pain syndrome: Secondary | ICD-10-CM

## 2019-09-22 DIAGNOSIS — D571 Sickle-cell disease without crisis: Secondary | ICD-10-CM

## 2019-09-22 DIAGNOSIS — K219 Gastro-esophageal reflux disease without esophagitis: Secondary | ICD-10-CM

## 2019-09-22 DIAGNOSIS — F1190 Opioid use, unspecified, uncomplicated: Secondary | ICD-10-CM

## 2019-09-22 DIAGNOSIS — Z79899 Other long term (current) drug therapy: Secondary | ICD-10-CM

## 2019-09-22 MED ORDER — OXYCONTIN 30 MG PO T12A: 1.0000 | EXTENDED_RELEASE_TABLET | Freq: Two times a day (BID) | ORAL | 0 refills | Status: DC

## 2019-09-22 MED ORDER — OXYCODONE HCL 10 MG PO TABS: 10.0000 mg | ORAL_TABLET | ORAL | 0 refills | Status: DC | PRN

## 2019-09-22 MED FILL — oxyCODONE HCL 10 MG TABS: 10 | 15 days supply | Qty: 90 | Fill #0

## 2019-09-23 ENCOUNTER — Telehealth: Payer: Self-pay | Admitting: Family Medicine

## 2019-09-23 NOTE — Telephone Encounter (Signed)
done

## 2019-09-24 MED FILL — LIDOCAINE-PRILOCAINE CREAM: 2.5-2.5 | 15 days supply | Qty: 30 | Fill #0

## 2019-09-25 MED FILL — OxyCONTIN 30 MG T12A: 30 | 30 days supply | Qty: 60 | Fill #0

## 2019-09-28 DIAGNOSIS — D571 Sickle-cell disease without crisis: Secondary | ICD-10-CM | POA: Diagnosis not present

## 2019-09-29 DIAGNOSIS — D571 Sickle-cell disease without crisis: Secondary | ICD-10-CM | POA: Diagnosis not present

## 2019-10-12 ENCOUNTER — Telehealth: Payer: Self-pay | Admitting: Family Medicine

## 2019-10-12 ENCOUNTER — Other Ambulatory Visit: Payer: Self-pay | Admitting: Family Medicine

## 2019-10-12 DIAGNOSIS — G4700 Insomnia, unspecified: Secondary | ICD-10-CM

## 2019-10-12 MED ORDER — ZOLPIDEM TARTRATE 5 MG PO TABS: 5.0000 mg | ORAL_TABLET | Freq: Every evening | ORAL | 0 refills | Status: DC | PRN

## 2019-10-12 MED FILL — ZOLPIDEM TARTRATE 5 MG TAB: 5 | 30 days supply | Qty: 30 | Fill #0

## 2019-10-12 NOTE — Telephone Encounter (Signed)
done

## 2019-10-13 MED FILL — DEFERASIROX TAB 360 MG: 360 | 30 days supply | Qty: 60 | Fill #2

## 2019-10-25 ENCOUNTER — Telehealth: Payer: Self-pay | Admitting: Family Medicine

## 2019-10-25 ENCOUNTER — Other Ambulatory Visit: Payer: Self-pay | Admitting: Family Medicine

## 2019-10-25 DIAGNOSIS — F1190 Opioid use, unspecified, uncomplicated: Secondary | ICD-10-CM

## 2019-10-25 DIAGNOSIS — Z79899 Other long term (current) drug therapy: Secondary | ICD-10-CM

## 2019-10-25 DIAGNOSIS — D571 Sickle-cell disease without crisis: Secondary | ICD-10-CM

## 2019-10-25 DIAGNOSIS — G894 Chronic pain syndrome: Secondary | ICD-10-CM

## 2019-10-25 MED ORDER — OXYCONTIN 30 MG PO T12A: 1.0000 | EXTENDED_RELEASE_TABLET | Freq: Two times a day (BID) | ORAL | 0 refills | Status: DC

## 2019-10-25 MED ORDER — OXYCODONE HCL 10 MG PO TABS: 10.0000 mg | ORAL_TABLET | ORAL | 0 refills | Status: DC | PRN

## 2019-10-25 MED FILL — OxyCONTIN 30 MG T12A: 30 | 30 days supply | Qty: 60 | Fill #0

## 2019-10-25 MED FILL — oxyCODONE HCL 10 MG TABS: 10 | 15 days supply | Qty: 90 | Fill #0

## 2019-10-25 NOTE — Telephone Encounter (Signed)
Done

## 2019-10-27 DIAGNOSIS — D571 Sickle-cell disease without crisis: Secondary | ICD-10-CM | POA: Diagnosis not present

## 2019-10-29 DIAGNOSIS — D571 Sickle-cell disease without crisis: Secondary | ICD-10-CM | POA: Diagnosis not present

## 2019-11-15 MED FILL — DEFERASIROX TAB 360 MG: 360 | 30 days supply | Qty: 60 | Fill #3

## 2019-11-16 ENCOUNTER — Telehealth: Payer: Self-pay | Admitting: Family Medicine

## 2019-11-16 ENCOUNTER — Other Ambulatory Visit: Payer: Self-pay | Admitting: Family Medicine

## 2019-11-16 DIAGNOSIS — G4700 Insomnia, unspecified: Secondary | ICD-10-CM

## 2019-11-16 MED FILL — ZOLPIDEM TARTRATE 5 MG TAB: 5 | 30 days supply | Qty: 30 | Fill #0

## 2019-11-16 NOTE — Telephone Encounter (Signed)
Ambien refill request.

## 2019-11-17 NOTE — Telephone Encounter (Signed)
done

## 2019-11-18 MED FILL — PROMETHAZINE 25 MG TABLET: 25 | 10 days supply | Qty: 30 | Fill #1

## 2019-11-25 ENCOUNTER — Other Ambulatory Visit: Payer: Self-pay | Admitting: Family Medicine

## 2019-11-25 ENCOUNTER — Telehealth: Payer: Self-pay | Admitting: Family Medicine

## 2019-11-25 DIAGNOSIS — D571 Sickle-cell disease without crisis: Secondary | ICD-10-CM

## 2019-11-25 DIAGNOSIS — G894 Chronic pain syndrome: Secondary | ICD-10-CM

## 2019-11-25 DIAGNOSIS — F1190 Opioid use, unspecified, uncomplicated: Secondary | ICD-10-CM

## 2019-11-25 DIAGNOSIS — Z79899 Other long term (current) drug therapy: Secondary | ICD-10-CM

## 2019-11-25 MED ORDER — OXYCODONE HCL 10 MG PO TABS: 10.0000 mg | ORAL_TABLET | ORAL | 0 refills | Status: DC | PRN

## 2019-11-25 MED ORDER — OXYCONTIN 30 MG PO T12A: 1.0000 | EXTENDED_RELEASE_TABLET | Freq: Two times a day (BID) | ORAL | 0 refills | Status: DC

## 2019-11-25 MED FILL — oxyCODONE HCL 10 MG TABS: 10 | 15 days supply | Qty: 90 | Fill #0

## 2019-11-25 MED FILL — OxyCONTIN 30 MG T12A: 30 | 30 days supply | Qty: 60 | Fill #0

## 2019-11-26 NOTE — Telephone Encounter (Signed)
DONE

## 2019-12-01 DIAGNOSIS — D571 Sickle-cell disease without crisis: Secondary | ICD-10-CM | POA: Diagnosis not present

## 2019-12-03 DIAGNOSIS — D571 Sickle-cell disease without crisis: Secondary | ICD-10-CM | POA: Diagnosis not present

## 2019-12-03 MED FILL — hydrOXYzine HCL 25 MG TABS: 25 | 20 days supply | Qty: 60 | Fill #2

## 2019-12-07 ENCOUNTER — Ambulatory Visit (INDEPENDENT_AMBULATORY_CARE_PROVIDER_SITE_OTHER): Payer: Medicaid Other | Admitting: Family Medicine

## 2019-12-07 ENCOUNTER — Encounter: Payer: Self-pay | Admitting: Family Medicine

## 2019-12-07 ENCOUNTER — Other Ambulatory Visit: Payer: Self-pay

## 2019-12-07 VITALS — BP 122/66 | HR 90 | Temp 98.3°F | Ht 64.0 in | Wt 172.6 lb

## 2019-12-07 DIAGNOSIS — Z8673 Personal history of transient ischemic attack (TIA), and cerebral infarction without residual deficits: Secondary | ICD-10-CM

## 2019-12-07 DIAGNOSIS — G894 Chronic pain syndrome: Secondary | ICD-10-CM | POA: Diagnosis not present

## 2019-12-07 DIAGNOSIS — R531 Weakness: Secondary | ICD-10-CM

## 2019-12-07 DIAGNOSIS — D571 Sickle-cell disease without crisis: Secondary | ICD-10-CM

## 2019-12-07 DIAGNOSIS — M21372 Foot drop, left foot: Secondary | ICD-10-CM

## 2019-12-07 DIAGNOSIS — F1190 Opioid use, unspecified, uncomplicated: Secondary | ICD-10-CM | POA: Diagnosis not present

## 2019-12-07 DIAGNOSIS — R29898 Other symptoms and signs involving the musculoskeletal system: Secondary | ICD-10-CM

## 2019-12-07 DIAGNOSIS — Z09 Encounter for follow-up examination after completed treatment for conditions other than malignant neoplasm: Secondary | ICD-10-CM

## 2019-12-07 DIAGNOSIS — G4700 Insomnia, unspecified: Secondary | ICD-10-CM

## 2019-12-07 DIAGNOSIS — K219 Gastro-esophageal reflux disease without esophagitis: Secondary | ICD-10-CM

## 2019-12-07 LAB — POCT URINALYSIS DIPSTICK
Blood, UA: NEGATIVE
Glucose, UA: NEGATIVE
Ketones, UA: NEGATIVE
Leukocytes, UA: NEGATIVE
Nitrite, UA: NEGATIVE
Protein, UA: POSITIVE — AB
Spec Grav, UA: 1.02 (ref 1.010–1.025)
Urobilinogen, UA: >=8 E.U./dL — AB
pH, UA: 6.5 (ref 5.0–8.0)

## 2019-12-07 NOTE — Progress Notes (Signed)
[facility]   Established Patient Office Visit  Subjective:  Patient ID: Debra Williamson, female    DOB: 07-26-1981  Age: 39 y.o. MRN: [id number]  CC:  Chief Complaint  Patient presents with  . Follow-up    Sickle Cell    HPI Debra Williamson is a 39 year old female who presents for Follow Up today.   Past Medical History:  Diagnosis Date  . Anemia   . Blood transfusion without reported diagnosis   . Migraine 05/2019  . Seizures (HCC)    one time incident, may have been r/t to a pain medication she was prescribed  . Sickle cell disease (HCC)   . Stroke [facility])    Current Status: Since hr last office visit, she is doing well with no complaints. She states that she has pain in her arms and legs. She rates her pain today at 0/10. She has not had a hospital visit for Sickle Cell Crisis since 11/25/2018 where she was treated and discharged the same day. She is currently taking all medications as prescribed and staying well hydrated. She reports occasional nausea, constipation, dizziness and headaches.  She states that her pain medications do not hold her pain well. She states that she has been been doing meditation which has been helpful. She recently received Exchange on 11/15/2019 at [facility]. She denies fevers, chills, fatigue, recent infections, weight loss, and night sweats. She has not had any visual changes, and falls. No chest pain, heart palpitations, cough and shortness of breath reported. No reports of GI problems such as vomiting, and diarrhea. She has no reports of blood in stools, dysuria and hematuria. No depression or anxiety reported today. She denies suicidal ideations, homicidal ideations, or auditory hallucinations. She denies pain today.    Past Surgical History:  Procedure Laterality Date  . CESAREAN SECTION     x4  . CHOLECYSTECTOMY    . PORTACATH PLACEMENT Right w-3  . reverse tubal ligation    . TEE WITHOUT  CARDIOVERSION N/A 09/10/2016   Procedure: TRANSESOPHAGEAL ECHOCARDIOGRAM (TEE);  Surgeon: Thurmon Fair, MD;  Location: [facility] ENDOSCOPY;  Service: Cardiovascular;  Laterality: N/A;    Family History  Problem Relation Age of Onset  . HIV/AIDS Father     Social History   Socioeconomic History  . Marital status: Married    Spouse name: Not on file  . Number of children: 4  . Years of education: Not on file  . Highest education level: Not on file  Occupational History  . Occupation: Doesn't work  Tobacco Use  . Smoking status: Never Smoker  . Smokeless tobacco: Never Used  Substance and Sexual Activity  . Alcohol use: No    Alcohol/week: 0.0 standard drinks    Comment: occasionally  . Drug use: No  . Sexual activity: Yes  Other Topics Concern  . Not on file  Social History Narrative  . Not on file   Social Determinants of Health   Financial Resource Strain:   . Difficulty of Paying Living Expenses: Not on file  Food Insecurity:   . Worried About Programme researcher, broadcasting/film/video in the Last Year: Not on file  . Ran Out of Food in the Last Year: Not on file  Transportation Needs:   . Lack of Transportation (Medical): Not on file  . Lack of Transportation (Non-Medical): Not on file  Physical Activity:   . Days of Exercise per Week: Not on file  .  Minutes of Exercise per Session: Not on file  Stress:   . Feeling of Stress : Not on file  Social Connections:   . Frequency of Communication with Friends and Family: Not on file  . Frequency of Social Gatherings with Friends and Family: Not on file  . Attends Religious Services: Not on file  . Active Member of Clubs or Organizations: Not on file  . Attends Banker Meetings: Not on file  . Marital Status: Not on file  Intimate Partner Violence:   . Fear of Current or Ex-Partner: Not on file  . Emotionally Abused: Not on file  . Physically Abused: Not on file  . Sexually Abused: Not on file    Outpatient Medications Prior  to Visit  Medication Sig Dispense Refill  . calcium carbonate (OS-CAL) 600 MG TABS tablet Take one tablet once daily after finishing the Drisdol.    . Deferasirox 360 MG TABS TAKE 2 TABLETS BY MOUTH ONCE A DAY    . albuterol (PROAIR HFA) 108 (90 Base) MCG/ACT inhaler INHALE 2 PUFFS INTO THE LUNGS EVERY 4 (FOUR) HOURS AS NEEDED FOR WHEEZING OR SHORTNESS OF BREATH (COUGH, SHORTNESS OF BREATH OR WHEEZING.). 8.5 g 11  . ascorbic acid (VITAMIN C) 1000 MG tablet Take by mouth.    Marland Kitchen aspirin EC 81 MG tablet Take 1 tablet by mouth every evening.     . cetirizine (ZYRTEC) 10 MG tablet Take 1 tablet (10 mg total) by mouth daily. 30 tablet 11  . Deferasirox (JADENU) 360 MG TABS Take 1 tablet (360 mg total) by mouth 2 (two) times daily. 60 tablet 3  . diclofenac sodium (VOLTAREN) 1 % GEL Apply 2 g topically 4 (four) times daily. 100 g 1  . folic acid (FOLVITE) 1 MG tablet Take 1 mg by mouth daily.     . hydrOXYzine (ATARAX/VISTARIL) 25 MG tablet Take 0.5-1 tablets (12.5-25 mg total) by mouth every 8 (eight) hours as needed for itching. 60 tablet 6  . lamoTRIgine (LAMICTAL) 200 MG tablet TAKE 1 TABLET (200 MG TOTAL) BY MOUTH 2 TIMES DAILY. 180 tablet 3  . levETIRAcetam (KEPPRA) 1000 MG tablet Take 1 tablet (1,000 mg total) by mouth 2 (two) times daily. 180 tablet 3  . lidocaine-prilocaine (EMLA) cream APPLY TO THE AFFECTED AREA(S) AS NEEDED 30 g 3  . lidocaine-prilocaine (EMLA) cream Apply 1 application topically as needed. 30 g 3  . meloxicam (MOBIC) 15 MG tablet Take 1 tablet (15 mg total) by mouth daily. 30 tablet 3  . omeprazole (PRILOSEC) 40 MG capsule Take 1 capsule (40 mg total) by mouth daily. 30 capsule 6  . Oxycodone HCl 10 MG TABS Take 1 tablet (10 mg total) by mouth every 4 (four) hours as needed for up to 15 days (pain). 90 tablet 0  . OXYCONTIN 30 MG 12 hr tablet Take 1 tablet (30 mg total) by mouth 2 (two) times daily. 60 tablet 0  . promethazine (PHENERGAN) 25 MG tablet Take 1 tablet (25 mg  total) by mouth every 8 (eight) hours as needed for nausea or vomiting. 1 tablet every 8 hours prn 30 tablet 2  . topiramate (TOPAMAX) 25 MG tablet Take 1 tablet (25 mg total) by mouth 2 (two) times daily as needed. 30 tablet 2  . zolpidem (AMBIEN) 5 MG tablet TAKE 1 TABLET BY MOUTH EVERY NIGHT AT BEDTIME AS NEEDED FOR SLEEP 30 tablet 0   No facility-administered medications prior to visit.    Allergies  Allergen Reactions  . Ibuprofen Hives  . Zofran [Ondansetron Hcl] Hives and Itching  . Demerol [Meperidine] Other (See Comments)    Pt has seizures  . Fentanyl And Related Nausea And Vomiting and Other (See Comments)    Very sedated  **Not allergic to the injection** JUST ALLERGIC TO PATCH  . Other Itching    Greek Yogurt  . Codeine Hives and Itching  . Hydrocodone Hives and Itching    ROS Review of Systems  Constitutional: Negative.   HENT: Negative.   Eyes: Negative.   Respiratory: Negative.   Cardiovascular: Negative.   Gastrointestinal: Positive for constipation (occasional ) and nausea (occasional).  Endocrine: Negative.   Genitourinary: Negative.   Musculoskeletal: Positive for arthralgias (generalized joint pain).  Skin: Negative.   Allergic/Immunologic: Negative.   Neurological: Positive for dizziness (occasional) and headaches (occasional ).  Hematological: Negative.   Psychiatric/Behavioral: Negative.       Objective:    Physical Exam  Constitutional: She is oriented to person, place, and time. She appears well-developed and well-nourished.  HENT:  Head: Normocephalic and atraumatic.  Eyes: Conjunctivae are normal.  Cardiovascular: Normal rate, regular rhythm, normal heart sounds and intact distal pulses.  Pulmonary/Chest: Effort normal and breath sounds normal.  Abdominal: Soft. Bowel sounds are normal.  Musculoskeletal:     Cervical back: Normal range of motion and neck supple.     Comments: Left-side weakness Left foot drop  Neurological: She is  alert and oriented to person, place, and time. She has normal reflexes.  Skin: Skin is warm and dry.  Psychiatric: She has a normal mood and affect. Her behavior is normal. Judgment and thought content normal.  Nursing note and vitals reviewed.   BP 122/66   Pulse 90   Temp 98.3 F (36.8 C) (Oral)   Ht 5\' 4"  (1.626 m)   Wt 172 lb 9.6 oz (78.3 kg)   SpO2 97%   BMI 29.63 kg/m  Wt Readings from Last 3 Encounters:  12/07/19 172 lb 9.6 oz (78.3 kg)  08/23/19 175 lb (79.4 kg)  07/02/19 168 lb 9.6 oz (76.5 kg)     There are no preventive care reminders to display for this patient.  There are no preventive care reminders to display for this patient.  Lab Results  Component Value Date   TSH 0.668 06/21/2016   Lab Results  Component Value Date   WBC 9.3 12/08/2018   HGB 7.7 (L) 12/08/2018   HCT 24.4 (L) 12/08/2018   MCV 88.7 12/08/2018   PLT 811 (H) 12/08/2018   Lab Results  Component Value Date   NA 137 11/30/2018   K 4.2 11/30/2018   CO2 24 11/30/2018   GLUCOSE 88 11/30/2018   BUN 10 11/30/2018   CREATININE 0.67 11/30/2018   BILITOT 2.3 (H) 11/30/2018   ALKPHOS 64 11/30/2018   AST 33 11/30/2018   ALT 25 11/30/2018   PROT 7.7 11/30/2018   ALBUMIN 4.4 11/30/2018   CALCIUM 8.9 11/30/2018   ANIONGAP 10 11/30/2018   Lab Results  Component Value Date   CHOL 128 09/07/2016   Lab Results  Component Value Date   HDL 26 (L) 09/07/2016   Lab Results  Component Value Date   LDLCALC 60 09/07/2016   Lab Results  Component Value Date   TRIG 211 (H) 09/07/2016   Lab Results  Component Value Date   CHOLHDL 4.9 09/07/2016   Lab Results  Component Value Date   HGBA1C <4.2 (L) 09/07/2016  Assessment & Plan:   1. 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 She will continue to take pain medications as prescribed; will continue to avoid extreme heat and cold; will continue to eat a healthy diet and drink at least 64 ounces of water daily; continue  stool softener as needed; will avoid colds and flu; will continue to get plenty of sleep and rest; will continue to avoid high stressful situations and remain infection free; will continue Folic Acid 1 mg daily to avoid sickle cell crisis. Continue to follow up with Hematologist as needed.  - POCT urinalysis dipstick  2. Chronic pain syndrome  3. Chronic, continuous use of opioids  4. History of stroke  5. Insomnia, unspecified type  6. Weakness of lower extremity, unspecified laterality  7. Left-sided weakness  8. Left foot drop  9. History of stroke associated with blood clotting tendency Stable. No signs or symptoms of recurrence noted or reported.   10. Gastroesophageal reflux disease without esophagitis  11. Follow up She will follow up in 2 months.   No orders of the defined types were placed in this encounter.   Orders Placed This Encounter  Procedures  . 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   Follow-up: Return in about 2 months (around 02/04/2020).    Kallie Locks, FNP

## 2019-12-13 ENCOUNTER — Other Ambulatory Visit: Payer: Self-pay | Admitting: Family Medicine

## 2019-12-13 ENCOUNTER — Telehealth: Payer: Self-pay | Admitting: Family Medicine

## 2019-12-13 DIAGNOSIS — G4700 Insomnia, unspecified: Secondary | ICD-10-CM

## 2019-12-13 MED FILL — DEFERASIROX 360 MG TABS: 360 | 30 days supply | Qty: 60 | Fill #0

## 2019-12-13 MED FILL — ZOLPIDEM TARTRATE 5 MG TAB: 5 | 30 days supply | Qty: 30 | Fill #0

## 2019-12-13 NOTE — Telephone Encounter (Signed)
Refill request for ambien. Please advise.  

## 2019-12-14 NOTE — Telephone Encounter (Signed)
Done

## 2019-12-20 ENCOUNTER — Telehealth: Payer: Self-pay | Admitting: Family Medicine

## 2019-12-21 ENCOUNTER — Other Ambulatory Visit: Payer: Self-pay | Admitting: Family Medicine

## 2019-12-21 DIAGNOSIS — D571 Sickle-cell disease without crisis: Secondary | ICD-10-CM

## 2019-12-21 DIAGNOSIS — Z79899 Other long term (current) drug therapy: Secondary | ICD-10-CM

## 2019-12-21 DIAGNOSIS — F1190 Opioid use, unspecified, uncomplicated: Secondary | ICD-10-CM

## 2019-12-21 DIAGNOSIS — G894 Chronic pain syndrome: Secondary | ICD-10-CM

## 2019-12-21 MED ORDER — OXYCONTIN 30 MG PO T12A: 1.0000 | EXTENDED_RELEASE_TABLET | Freq: Two times a day (BID) | ORAL | 0 refills | Status: DC

## 2019-12-21 MED ORDER — OXYCODONE HCL 10 MG PO TABS: 10.0000 mg | ORAL_TABLET | ORAL | 0 refills | Status: DC | PRN

## 2019-12-21 MED FILL — PROMETHAZINE 25 MG TABLET: 25 | 10 days supply | Qty: 30 | Fill #1

## 2019-12-21 MED FILL — hydrOXYzine HCL 25 MG TABS: 25 | 20 days supply | Qty: 60 | Fill #2

## 2019-12-21 NOTE — Telephone Encounter (Signed)
Done

## 2019-12-23 MED FILL — LIDOCAINE-PRILOCAINE CREAM: 2.5-2.5 | 15 days supply | Qty: 30 | Fill #1

## 2019-12-23 MED FILL — levETIRAcetam 1000 MG TABS: 1000 | 90 days supply | Qty: 180 | Fill #1

## 2019-12-23 MED FILL — OxyCONTIN 30 MG T12A: 30 | 30 days supply | Qty: 60 | Fill #0

## 2019-12-23 MED FILL — SUBVENITE 200 MG TABS: 200 | 90 days supply | Qty: 180 | Fill #1

## 2019-12-23 MED FILL — ALBUTEROL SULFATE HFA 108 (: 108 (90 BAS | 17 days supply | Qty: 9 | Fill #4

## 2019-12-23 MED FILL — NARCAN 4 MG NASAL SPRAY: 4 | 1 days supply | Qty: 2 | Fill #0

## 2019-12-24 MED FILL — oxyCODONE HCL 10 MG TABS: 10 | 15 days supply | Qty: 90 | Fill #0

## 2019-12-29 DIAGNOSIS — D571 Sickle-cell disease without crisis: Secondary | ICD-10-CM | POA: Diagnosis not present

## 2019-12-31 DIAGNOSIS — D571 Sickle-cell disease without crisis: Secondary | ICD-10-CM | POA: Diagnosis not present

## 2020-01-12 ENCOUNTER — Other Ambulatory Visit: Payer: Self-pay | Admitting: Family Medicine

## 2020-01-12 ENCOUNTER — Telehealth: Payer: Self-pay | Admitting: Family Medicine

## 2020-01-12 DIAGNOSIS — G4700 Insomnia, unspecified: Secondary | ICD-10-CM

## 2020-01-12 NOTE — Telephone Encounter (Signed)
Zolpidem Tartrate 5 MG refill requested.

## 2020-01-13 MED FILL — ZOLPIDEM TARTRATE 5 MG TAB: 5 | 30 days supply | Qty: 30 | Fill #0

## 2020-01-13 NOTE — Telephone Encounter (Signed)
done

## 2020-01-17 MED FILL — DEFERASIROX 360 MG TABS: 360 | 30 days supply | Qty: 60 | Fill #1

## 2020-01-24 ENCOUNTER — Telehealth: Payer: Self-pay | Admitting: Family Medicine

## 2020-01-25 ENCOUNTER — Other Ambulatory Visit: Payer: Self-pay | Admitting: Family Medicine

## 2020-01-25 DIAGNOSIS — G894 Chronic pain syndrome: Secondary | ICD-10-CM

## 2020-01-25 DIAGNOSIS — F1190 Opioid use, unspecified, uncomplicated: Secondary | ICD-10-CM

## 2020-01-25 DIAGNOSIS — D571 Sickle-cell disease without crisis: Secondary | ICD-10-CM

## 2020-01-25 DIAGNOSIS — Z79899 Other long term (current) drug therapy: Secondary | ICD-10-CM

## 2020-01-25 MED ORDER — OXYCODONE HCL 10 MG PO TABS: 10.0000 mg | ORAL_TABLET | ORAL | 0 refills | Status: DC | PRN

## 2020-01-25 MED ORDER — OXYCONTIN 30 MG PO T12A: 1.0000 | EXTENDED_RELEASE_TABLET | Freq: Two times a day (BID) | ORAL | 0 refills | Status: DC

## 2020-01-25 MED FILL — oxyCODONE HCL 10 MG TABS: 10 | 15 days supply | Qty: 90 | Fill #0

## 2020-01-25 MED FILL — OxyCONTIN 30 MG T12A: 30 | 30 days supply | Qty: 60 | Fill #0

## 2020-01-25 NOTE — Telephone Encounter (Signed)
Done

## 2020-01-26 DIAGNOSIS — D571 Sickle-cell disease without crisis: Secondary | ICD-10-CM | POA: Diagnosis not present

## 2020-01-26 MED FILL — hydrOXYzine HCL 25 MG TABS: 25 | 20 days supply | Qty: 60 | Fill #3

## 2020-01-26 MED FILL — PROMETHAZINE 25 MG TABLET: 25 | 10 days supply | Qty: 30 | Fill #2

## 2020-01-28 DIAGNOSIS — D571 Sickle-cell disease without crisis: Secondary | ICD-10-CM | POA: Diagnosis not present

## 2020-02-07 ENCOUNTER — Ambulatory Visit: Payer: Medicaid Other | Admitting: Family Medicine

## 2020-02-09 ENCOUNTER — Ambulatory Visit: Payer: Medicaid Other | Admitting: Family Medicine

## 2020-02-14 ENCOUNTER — Other Ambulatory Visit: Payer: Self-pay | Admitting: Family Medicine

## 2020-02-14 ENCOUNTER — Telehealth: Payer: Self-pay | Admitting: Family Medicine

## 2020-02-14 DIAGNOSIS — G4700 Insomnia, unspecified: Secondary | ICD-10-CM

## 2020-02-14 MED ORDER — ZOLPIDEM TARTRATE 5 MG PO TABS: 5.0000 mg | ORAL_TABLET | Freq: Every evening | ORAL | 0 refills | Status: DC | PRN

## 2020-02-15 MED FILL — ZOLPIDEM TARTRATE 5 MG TAB: 5 | 30 days supply | Qty: 30 | Fill #0

## 2020-02-16 ENCOUNTER — Ambulatory Visit: Payer: Medicaid Other | Admitting: Family Medicine

## 2020-02-16 MED FILL — ALBUTEROL SULFATE HFA 108 (: 108 (90 BAS | 17 days supply | Qty: 9 | Fill #5

## 2020-02-16 MED FILL — NARCAN 4 MG NASAL SPRAY: 4 | 1 days supply | Qty: 2 | Fill #0

## 2020-02-16 NOTE — Telephone Encounter (Signed)
Done

## 2020-02-18 ENCOUNTER — Other Ambulatory Visit: Payer: Self-pay | Admitting: Family Medicine

## 2020-02-18 ENCOUNTER — Telehealth: Payer: Self-pay | Admitting: Family Medicine

## 2020-02-18 DIAGNOSIS — D571 Sickle-cell disease without crisis: Secondary | ICD-10-CM

## 2020-02-18 DIAGNOSIS — Z79899 Other long term (current) drug therapy: Secondary | ICD-10-CM

## 2020-02-18 DIAGNOSIS — F1190 Opioid use, unspecified, uncomplicated: Secondary | ICD-10-CM

## 2020-02-18 DIAGNOSIS — G894 Chronic pain syndrome: Secondary | ICD-10-CM

## 2020-02-18 MED ORDER — OXYCODONE HCL 10 MG PO TABS: 10.0000 mg | ORAL_TABLET | ORAL | 0 refills | Status: DC | PRN

## 2020-02-18 MED ORDER — OXYCONTIN 30 MG PO T12A: 1.0000 | EXTENDED_RELEASE_TABLET | Freq: Two times a day (BID) | ORAL | 0 refills | Status: DC

## 2020-02-18 MED FILL — DEFERASIROX 360 MG TABS: 360 | 30 days supply | Qty: 60 | Fill #2

## 2020-02-19 MED FILL — oxyCODONE HCL 10 MG TABS: 10 | 15 days supply | Qty: 90 | Fill #0

## 2020-02-21 NOTE — Telephone Encounter (Signed)
done

## 2020-02-22 ENCOUNTER — Ambulatory Visit (INDEPENDENT_AMBULATORY_CARE_PROVIDER_SITE_OTHER): Payer: Medicaid Other | Admitting: Family Medicine

## 2020-02-22 ENCOUNTER — Encounter: Payer: Self-pay | Admitting: Family Medicine

## 2020-02-22 ENCOUNTER — Other Ambulatory Visit: Payer: Self-pay

## 2020-02-22 VITALS — BP 131/69 | HR 91 | Temp 98.6°F | Ht 64.0 in | Wt 178.8 lb

## 2020-02-22 DIAGNOSIS — R8290 Unspecified abnormal findings in urine: Secondary | ICD-10-CM

## 2020-02-22 DIAGNOSIS — R29898 Other symptoms and signs involving the musculoskeletal system: Secondary | ICD-10-CM

## 2020-02-22 DIAGNOSIS — F1190 Opioid use, unspecified, uncomplicated: Secondary | ICD-10-CM

## 2020-02-22 DIAGNOSIS — Z79899 Other long term (current) drug therapy: Secondary | ICD-10-CM | POA: Diagnosis not present

## 2020-02-22 DIAGNOSIS — Z8673 Personal history of transient ischemic attack (TIA), and cerebral infarction without residual deficits: Secondary | ICD-10-CM

## 2020-02-22 DIAGNOSIS — G4700 Insomnia, unspecified: Secondary | ICD-10-CM

## 2020-02-22 DIAGNOSIS — G894 Chronic pain syndrome: Secondary | ICD-10-CM

## 2020-02-22 DIAGNOSIS — Z09 Encounter for follow-up examination after completed treatment for conditions other than malignant neoplasm: Secondary | ICD-10-CM

## 2020-02-22 DIAGNOSIS — D571 Sickle-cell disease without crisis: Secondary | ICD-10-CM | POA: Diagnosis not present

## 2020-02-22 LAB — POCT URINALYSIS DIPSTICK
Bilirubin, UA: NEGATIVE
Glucose, UA: NEGATIVE
Ketones, UA: NEGATIVE
Leukocytes, UA: NEGATIVE
Nitrite, UA: NEGATIVE
Protein, UA: NEGATIVE
Spec Grav, UA: 1.01 (ref 1.010–1.025)
Urobilinogen, UA: 0.2 E.U./dL
pH, UA: 6 (ref 5.0–8.0)

## 2020-02-22 NOTE — Progress Notes (Signed)
[facility]   Established Patient Office Visit  Subjective:  Patient ID: Debra Williamson, female    DOB: 1981-07-24  Age: 39 y.o. MRN: [id number]  CC:  Chief Complaint  Patient presents with  . Follow-up    Sickle Cell     HPI Debra Williamson is a 39 year old female who presents for Follow Up.   Past Medical History:  Diagnosis Date  . Anemia   . Blood transfusion without reported diagnosis   . Migraine 05/2019  . Seizures (HCC)    one time incident, may have been r/t to a pain medication she was prescribed  . Sickle cell disease (HCC)   . Stroke [facility])    Current Status: Since her last office visit, she is doing well with no complaints. She states that she has pain in her arms and legs. She rates her pain today at 5/10. She has not had a hospital visit for Sickle Cell Crisis since 11/26/2019 where she was treated and discharged the same Williamson. She is currently taking all medications as prescribed and staying well hydrated. She reports occasional nausea, constipation, dizziness and headaches.  She recently discontinued Deferasirox (Jadenu) tablets, per Hematologist as Iron levels are stable. She denies fevers, chills, fatigue, recent infections, weight loss, and night sweats. She has not had visual changes, and falls. No chest pain, heart palpitations, cough and shortness of breath reported. Denies GI problems such as nausea, and diarrhea. She has no reports of blood in stools, dysuria and hematuria. No depression or anxiety reported today. He denies suicidal ideations, homicidal ideations, or auditory hallucinations. She is taking all medications as prescribed.   Past Surgical History:  Procedure Laterality Date  . CESAREAN SECTION     x4  . CHOLECYSTECTOMY    . PORTACATH PLACEMENT Right w-3  . reverse tubal ligation    . TEE WITHOUT CARDIOVERSION N/A 09/10/2016   Procedure: TRANSESOPHAGEAL ECHOCARDIOGRAM (TEE);  Surgeon: Thurmon Fair, MD;  Location: [facility] ENDOSCOPY;  Service: Cardiovascular;  Laterality: N/A;    Family History  Problem Relation Age of Onset  . HIV/AIDS Father     Social History   Socioeconomic History  . Marital status: Married    Spouse name: Not on file  . Number of children: 4  . Years of education: Not on file  . Highest education level: Not on file  Occupational History  . Occupation: Doesn't work  Tobacco Use  . Smoking status: Never Smoker  . Smokeless tobacco: Never Used  Substance and Sexual Activity  . Alcohol use: No    Alcohol/week: 0.0 standard drinks    Comment: occasionally  . Drug use: No  . Sexual activity: Yes  Other Topics Concern  . Not on file  Social History Narrative  . Not on file   Social Determinants of Health   Financial Resource Strain:   . Difficulty of Paying Living Expenses:   Food Insecurity:   . Worried About Programme researcher, broadcasting/film/video in the Last Year:   . Barista in the Last Year:   Transportation Needs:   . Freight forwarder (Medical):   Marland Kitchen Lack of Transportation (Non-Medical):   Physical Activity:   . Days of Exercise per Week:   . Minutes of Exercise per Session:   Stress:   . Feeling of Stress :   Social Connections:   . Frequency of Communication with Friends and Family:   . Frequency of  Social Gatherings with Friends and Family:   . Attends Religious Services:   . Active Member of Clubs or Organizations:   . Attends Banker Meetings:   Marland Kitchen Marital Status:   Intimate Partner Violence:   . Fear of Current or Ex-Partner:   . Emotionally Abused:   Marland Kitchen Physically Abused:   . Sexually Abused:     Outpatient Medications Prior to Visit  Medication Sig Dispense Refill  . albuterol (PROAIR HFA) 108 (90 Base) MCG/ACT inhaler INHALE 2 PUFFS INTO THE LUNGS EVERY 4 (FOUR) HOURS AS NEEDED FOR WHEEZING OR SHORTNESS OF BREATH (COUGH, SHORTNESS OF BREATH OR WHEEZING.). 8.5 g 11  . ascorbic acid (VITAMIN C) 1000 MG tablet  Take by mouth.    Marland Kitchen aspirin EC 81 MG tablet Take 1 tablet by mouth every evening.     . cetirizine (ZYRTEC) 10 MG tablet Take 1 tablet (10 mg total) by mouth daily. 30 tablet 11  . diclofenac sodium (VOLTAREN) 1 % GEL Apply 2 g topically 4 (four) times daily. 100 g 1  . folic acid (FOLVITE) 1 MG tablet Take 1 mg by mouth daily.     . hydrOXYzine (ATARAX/VISTARIL) 25 MG tablet Take 0.5-1 tablets (12.5-25 mg total) by mouth every 8 (eight) hours as needed for itching. 60 tablet 6  . lamoTRIgine (LAMICTAL) 200 MG tablet TAKE 1 TABLET (200 MG TOTAL) BY MOUTH 2 TIMES DAILY. 180 tablet 3  . levETIRAcetam (KEPPRA) 1000 MG tablet Take 1 tablet (1,000 mg total) by mouth 2 (two) times daily. 180 tablet 3  . meloxicam (MOBIC) 15 MG tablet Take 1 tablet (15 mg total) by mouth daily. 30 tablet 3  . omeprazole (PRILOSEC) 40 MG capsule Take 1 capsule (40 mg total) by mouth daily. 30 capsule 6  . Oxycodone HCl 10 MG TABS Take 1 tablet (10 mg total) by mouth every 4 (four) hours as needed for up to 15 days (pain). 90 tablet 0  . OXYCONTIN 30 MG 12 hr tablet Take 1 tablet (30 mg total) by mouth 2 (two) times daily. 60 tablet 0  . promethazine (PHENERGAN) 25 MG tablet Take 1 tablet (25 mg total) by mouth every 8 (eight) hours as needed for nausea or vomiting. 1 tablet every 8 hours prn 30 tablet 2  . topiramate (TOPAMAX) 25 MG tablet Take 1 tablet (25 mg total) by mouth 2 (two) times daily as needed. 30 tablet 2  . zolpidem (AMBIEN) 5 MG tablet Take 1 tablet (5 mg total) by mouth at bedtime as needed. for sleep 30 tablet 0  . calcium carbonate (OS-CAL) 600 MG TABS tablet Take one tablet once daily after finishing the Drisdol.    . Deferasirox 360 MG TABS TAKE 2 TABLETS BY MOUTH ONCE A Williamson    . lidocaine-prilocaine (EMLA) cream APPLY TO THE AFFECTED AREA(S) AS NEEDED 30 g 3  . lidocaine-prilocaine (EMLA) cream Apply 1 application topically as needed. 30 g 3  . Deferasirox (JADENU) 360 MG TABS Take 1 tablet (360 mg  total) by mouth 2 (two) times daily. (Patient not taking: Reported on 02/22/2020) 60 tablet 3   No facility-administered medications prior to visit.    Allergies  Allergen Reactions  . Zofran [Ondansetron Hcl] Hives and Itching  . Demerol [Meperidine] Other (See Comments)    Pt has seizures  . Fentanyl And Related Nausea And Vomiting and Other (See Comments)    Very sedated  **Not allergic to the injection** JUST ALLERGIC TO PATCH  .  Other Itching    Greek Yogurt  . Codeine Hives and Itching  . Hydrocodone Hives and Itching  . Ibuprofen     ROS Review of Systems  Constitutional: Negative.   HENT: Negative.   Eyes: Negative.   Respiratory: Negative.   Cardiovascular: Negative.   Gastrointestinal: Positive for abdominal distention.  Endocrine: Negative.   Genitourinary: Negative.   Musculoskeletal: Negative.   Skin: Negative.   Allergic/Immunologic: Negative.   Neurological: Positive for dizziness (occasional) and headaches (occasional).  Hematological: Negative.   Psychiatric/Behavioral: Negative.       Objective:    Physical Exam  Constitutional: She is oriented to person, place, and time. She appears well-developed and well-nourished.  HENT:  Head: Normocephalic and atraumatic.  Eyes: Conjunctivae are normal.  Cardiovascular: Normal rate, regular rhythm, normal heart sounds and intact distal pulses.  Pulmonary/Chest: Effort normal and breath sounds normal.  Abdominal: Soft. Bowel sounds are normal.  Musculoskeletal:        General: Normal range of motion.     Cervical back: Normal range of motion and neck supple.  Neurological: She is alert and oriented to person, place, and time. She has normal reflexes.  Skin: Skin is warm and dry.  Psychiatric: She has a normal mood and affect. Her behavior is normal. Judgment and thought content normal.  Nursing note and vitals reviewed.   BP 131/69   Pulse 91   Temp 98.6 F (37 C)   Ht 5\' 4"  (1.626 m)   Wt 178 lb  12.8 oz (81.1 kg)   SpO2 97%   BMI 30.69 kg/m  Wt Readings from Last 3 Encounters:  02/22/20 178 lb 12.8 oz (81.1 kg)  12/07/19 172 lb 9.6 oz (78.3 kg)  08/23/19 175 lb (79.4 kg)     Health Maintenance Due  Topic Date Due  . COVID-19 Vaccine (1) Never done    There are no preventive care reminders to display for this patient.  Lab Results  Component Value Date   TSH 0.668 06/21/2016   Lab Results  Component Value Date   WBC 9.3 12/08/2018   HGB 7.7 (L) 12/08/2018   HCT 24.4 (L) 12/08/2018   MCV 88.7 12/08/2018   PLT 811 (H) 12/08/2018   Lab Results  Component Value Date   NA 137 11/30/2018   K 4.2 11/30/2018   CO2 24 11/30/2018   GLUCOSE 88 11/30/2018   BUN 10 11/30/2018   CREATININE 0.67 11/30/2018   BILITOT 2.3 (H) 11/30/2018   ALKPHOS 64 11/30/2018   AST 33 11/30/2018   ALT 25 11/30/2018   PROT 7.7 11/30/2018   ALBUMIN 4.4 11/30/2018   CALCIUM 8.9 11/30/2018   ANIONGAP 10 11/30/2018   Lab Results  Component Value Date   CHOL 128 09/07/2016   Lab Results  Component Value Date   HDL 26 (L) 09/07/2016   Lab Results  Component Value Date   LDLCALC 60 09/07/2016   Lab Results  Component Value Date   TRIG 211 (H) 09/07/2016   Lab Results  Component Value Date   CHOLHDL 4.9 09/07/2016   Lab Results  Component Value Date   HGBA1C <4.2 (L) 09/07/2016   Assessment & Plan:   1. 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 She will continue to take pain medications as prescribed; will continue to avoid extreme heat and cold; will continue to eat a healthy diet and drink at least 64 ounces of water daily; continue  stool softener as needed; will avoid colds and flu; will continue to get plenty of sleep and rest; will continue to avoid high stressful situations and remain infection free; will continue Folic Acid 1 mg daily to avoid sickle cell crisis. Continue to follow up with Hematologist as needed.   -  POCT urinalysis dipstick  2. Chronic pain syndrome  3. Chronic, continuous use of opioids  4. Medication management  5. History of stroke No signs or symptoms of recurrence noted or reported.  6. Weakness of lower extremity, unspecified laterality  7. Increased storage iron Stable. Iron stores are within normal range. Deferasirox (Jadenu) tablets was recently discontinued per Hematologist.   8. Insomnia, unspecified type  9. Abnormal urinalysis Results are pending.   10. Follow up She will follow up in 2 months.   No orders of the defined types were placed in this encounter.   Orders Placed This Encounter  Procedures  . POCT urinalysis dipstick    Referral Orders  No referral(s) requested today    Raliegh Ip,  MSN, FNP-BC [facility] [address] 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   Follow-up: No follow-ups on file.    Kallie Locks, FNP

## 2020-02-23 MED FILL — OxyCONTIN 30 MG T12A: 30 | 30 days supply | Qty: 60 | Fill #0

## 2020-02-27 DIAGNOSIS — R29898 Other symptoms and signs involving the musculoskeletal system: Secondary | ICD-10-CM | POA: Insufficient documentation

## 2020-03-01 ENCOUNTER — Telehealth: Payer: Self-pay | Admitting: Family Medicine

## 2020-03-01 ENCOUNTER — Other Ambulatory Visit: Payer: Self-pay | Admitting: Family Medicine

## 2020-03-01 DIAGNOSIS — D571 Sickle-cell disease without crisis: Secondary | ICD-10-CM | POA: Diagnosis not present

## 2020-03-01 DIAGNOSIS — Z95828 Presence of other vascular implants and grafts: Secondary | ICD-10-CM

## 2020-03-01 NOTE — Telephone Encounter (Signed)
Is this okay to refill? 

## 2020-03-03 DIAGNOSIS — D571 Sickle-cell disease without crisis: Secondary | ICD-10-CM | POA: Diagnosis not present

## 2020-03-03 NOTE — Telephone Encounter (Signed)
Done

## 2020-03-04 DIAGNOSIS — Z23 Encounter for immunization: Secondary | ICD-10-CM | POA: Diagnosis not present

## 2020-03-08 DIAGNOSIS — D571 Sickle-cell disease without crisis: Secondary | ICD-10-CM | POA: Diagnosis not present

## 2020-03-16 ENCOUNTER — Other Ambulatory Visit: Payer: Self-pay | Admitting: Family Medicine

## 2020-03-16 ENCOUNTER — Telehealth: Payer: Self-pay | Admitting: Family Medicine

## 2020-03-16 DIAGNOSIS — G4700 Insomnia, unspecified: Secondary | ICD-10-CM

## 2020-03-16 MED ORDER — ZOLPIDEM TARTRATE 5 MG PO TABS: 5.0000 mg | ORAL_TABLET | Freq: Every evening | ORAL | 0 refills | Status: DC | PRN

## 2020-03-16 NOTE — Telephone Encounter (Signed)
Pt called in refill on Ambien

## 2020-03-17 ENCOUNTER — Telehealth: Payer: Self-pay | Admitting: Family Medicine

## 2020-03-17 ENCOUNTER — Other Ambulatory Visit: Payer: Self-pay | Admitting: Family Medicine

## 2020-03-17 DIAGNOSIS — Z79899 Other long term (current) drug therapy: Secondary | ICD-10-CM

## 2020-03-17 DIAGNOSIS — F1190 Opioid use, unspecified, uncomplicated: Secondary | ICD-10-CM

## 2020-03-17 DIAGNOSIS — D571 Sickle-cell disease without crisis: Secondary | ICD-10-CM

## 2020-03-17 DIAGNOSIS — G894 Chronic pain syndrome: Secondary | ICD-10-CM

## 2020-03-17 MED ORDER — OXYCODONE HCL 10 MG PO TABS: 10.0000 mg | ORAL_TABLET | ORAL | 0 refills | Status: DC | PRN

## 2020-03-17 MED ORDER — OXYCONTIN 30 MG PO T12A: 1.0000 | EXTENDED_RELEASE_TABLET | Freq: Two times a day (BID) | ORAL | 0 refills | Status: DC

## 2020-03-17 MED FILL — PROMETHAZINE 25 MG TABLET: 25 | 10 days supply | Qty: 30 | Fill #1

## 2020-03-17 MED FILL — levETIRAcetam 1000 MG TABS: 1000 | 90 days supply | Qty: 180 | Fill #2

## 2020-03-17 MED FILL — ZOLPIDEM TARTRATE 5 MG TAB: 5 | 30 days supply | Qty: 30 | Fill #0

## 2020-03-17 MED FILL — hydrOXYzine HCL 25 MG TABS: 25 | 20 days supply | Qty: 60 | Fill #4

## 2020-03-17 MED FILL — SUBVENITE 200 MG TABS: 200 | 90 days supply | Qty: 180 | Fill #2

## 2020-03-17 NOTE — Telephone Encounter (Signed)
Pt called in refill on oxycotin and oxycodone °

## 2020-03-18 MED FILL — oxyCODONE HCL 10 MG TABS: 10 | 15 days supply | Qty: 90 | Fill #0

## 2020-03-23 ENCOUNTER — Other Ambulatory Visit: Payer: Self-pay | Admitting: Family Medicine

## 2020-03-23 DIAGNOSIS — R0602 Shortness of breath: Secondary | ICD-10-CM

## 2020-03-23 MED FILL — PROAIR HFA 90 MCG INHALER: 108 (90 BAS | 17 days supply | Qty: 9 | Fill #0

## 2020-03-23 MED FILL — OxyCONTIN 30 MG T12A: 30 | 30 days supply | Qty: 60 | Fill #0

## 2020-04-01 DIAGNOSIS — Z23 Encounter for immunization: Secondary | ICD-10-CM | POA: Diagnosis not present

## 2020-04-03 DIAGNOSIS — D571 Sickle-cell disease without crisis: Secondary | ICD-10-CM | POA: Diagnosis not present

## 2020-04-05 DIAGNOSIS — D571 Sickle-cell disease without crisis: Secondary | ICD-10-CM | POA: Diagnosis not present

## 2020-04-14 ENCOUNTER — Other Ambulatory Visit: Payer: Self-pay | Admitting: Family Medicine

## 2020-04-14 ENCOUNTER — Telehealth: Payer: Self-pay | Admitting: Family Medicine

## 2020-04-14 DIAGNOSIS — G4700 Insomnia, unspecified: Secondary | ICD-10-CM

## 2020-04-14 MED ORDER — ZOLPIDEM TARTRATE 5 MG PO TABS: 5.0000 mg | ORAL_TABLET | Freq: Every evening | ORAL | 0 refills | Status: DC | PRN

## 2020-04-15 MED FILL — ZOLPIDEM TARTRATE 5 MG TAB: 5 | 30 days supply | Qty: 30 | Fill #0

## 2020-04-17 NOTE — Telephone Encounter (Signed)
Done

## 2020-04-20 ENCOUNTER — Telehealth: Payer: Self-pay | Admitting: Family Medicine

## 2020-04-20 ENCOUNTER — Other Ambulatory Visit: Payer: Self-pay | Admitting: Family Medicine

## 2020-04-20 DIAGNOSIS — D571 Sickle-cell disease without crisis: Secondary | ICD-10-CM

## 2020-04-20 DIAGNOSIS — G894 Chronic pain syndrome: Secondary | ICD-10-CM

## 2020-04-20 DIAGNOSIS — F1190 Opioid use, unspecified, uncomplicated: Secondary | ICD-10-CM

## 2020-04-20 DIAGNOSIS — Z79899 Other long term (current) drug therapy: Secondary | ICD-10-CM

## 2020-04-20 MED ORDER — OXYCONTIN 30 MG PO T12A: 1.0000 | EXTENDED_RELEASE_TABLET | Freq: Two times a day (BID) | ORAL | 0 refills | Status: DC

## 2020-04-20 MED ORDER — OXYCODONE HCL 10 MG PO TABS: 10.0000 mg | ORAL_TABLET | ORAL | 0 refills | Status: DC | PRN

## 2020-04-20 NOTE — Telephone Encounter (Signed)
Pt called in refill on oxycotin and oxycodone

## 2020-04-21 MED FILL — oxyCODONE HCL 10 MG TABS: 10 | 15 days supply | Qty: 90 | Fill #0

## 2020-04-21 MED FILL — OxyCONTIN 30 MG T12A: 30 | 30 days supply | Qty: 60 | Fill #0

## 2020-04-26 ENCOUNTER — Ambulatory Visit: Payer: Medicaid Other | Admitting: Family Medicine

## 2020-04-27 NOTE — Telephone Encounter (Signed)
In error

## 2020-05-01 ENCOUNTER — Ambulatory Visit: Payer: Medicaid Other | Admitting: Family Medicine

## 2020-05-12 DIAGNOSIS — D571 Sickle-cell disease without crisis: Secondary | ICD-10-CM | POA: Diagnosis not present

## 2020-05-15 DIAGNOSIS — Z79899 Other long term (current) drug therapy: Secondary | ICD-10-CM | POA: Diagnosis not present

## 2020-05-15 DIAGNOSIS — D571 Sickle-cell disease without crisis: Secondary | ICD-10-CM | POA: Diagnosis not present

## 2020-05-16 ENCOUNTER — Telehealth: Payer: Self-pay | Admitting: Family Medicine

## 2020-05-17 ENCOUNTER — Other Ambulatory Visit: Payer: Self-pay | Admitting: Nurse Practitioner

## 2020-05-17 ENCOUNTER — Other Ambulatory Visit: Payer: Self-pay | Admitting: Family Medicine

## 2020-05-17 DIAGNOSIS — G4700 Insomnia, unspecified: Secondary | ICD-10-CM

## 2020-05-17 NOTE — Telephone Encounter (Signed)
Refill request for zolpidem. Please advise. Thanks!

## 2020-05-18 MED FILL — ZOLPIDEM TARTRATE 5 MG TAB: 5 | 30 days supply | Qty: 15 | Fill #0

## 2020-05-19 NOTE — Telephone Encounter (Signed)
Done

## 2020-05-24 ENCOUNTER — Ambulatory Visit (INDEPENDENT_AMBULATORY_CARE_PROVIDER_SITE_OTHER): Payer: Medicaid Other | Admitting: Family Medicine

## 2020-05-24 ENCOUNTER — Other Ambulatory Visit: Payer: Self-pay

## 2020-05-24 VITALS — BP 112/63 | HR 64 | Temp 97.3°F | Resp 17 | Ht 64.0 in | Wt 177.6 lb

## 2020-05-24 DIAGNOSIS — G894 Chronic pain syndrome: Secondary | ICD-10-CM | POA: Diagnosis not present

## 2020-05-24 DIAGNOSIS — Z79899 Other long term (current) drug therapy: Secondary | ICD-10-CM

## 2020-05-24 DIAGNOSIS — D571 Sickle-cell disease without crisis: Secondary | ICD-10-CM

## 2020-05-24 DIAGNOSIS — F1190 Opioid use, unspecified, uncomplicated: Secondary | ICD-10-CM | POA: Diagnosis not present

## 2020-05-24 DIAGNOSIS — Z09 Encounter for follow-up examination after completed treatment for conditions other than malignant neoplasm: Secondary | ICD-10-CM

## 2020-05-24 LAB — POCT URINALYSIS DIPSTICK
Bilirubin, UA: NEGATIVE
Glucose, UA: NEGATIVE
Ketones, UA: NEGATIVE
Leukocytes, UA: NEGATIVE
Nitrite, UA: NEGATIVE
Protein, UA: POSITIVE — AB
Spec Grav, UA: 1.015 (ref 1.010–1.025)
Urobilinogen, UA: 0.2 E.U./dL
pH, UA: 5.5 (ref 5.0–8.0)

## 2020-05-24 MED ORDER — OXYCODONE HCL 10 MG PO TABS: 10.0000 mg | ORAL_TABLET | ORAL | 0 refills | Status: AC | PRN

## 2020-05-24 MED ORDER — OXYCONTIN 30 MG PO T12A: 1.0000 | EXTENDED_RELEASE_TABLET | Freq: Two times a day (BID) | ORAL | 0 refills | Status: DC

## 2020-05-24 MED FILL — oxyCODONE HCL 10 MG TABS: 10 | 15 days supply | Qty: 90 | Fill #0

## 2020-05-24 MED FILL — OxyCONTIN 30 MG T12A: 30 | 30 days supply | Qty: 60 | Fill #0

## 2020-05-24 NOTE — Progress Notes (Signed)
[facility]    Established Patient Office Visit  Subjective:  Patient ID: Debra Williamson, female    DOB: [birth year]  Age: 39 y.o. MRN: [id number]  CC:  Chief Complaint  Patient presents with  . Follow-up    Pt states Debra Williamson is here for a f/u. Pt states Debra Williamson is having back problems.    HPI Debra Williamson is a 39 year old female who presents today for Follow Up today.   Patient Active Problem List   Diagnosis Date Noted  . Weakness of lower extremity 02/27/2020  . Insomnia 08/24/2019  . Increased storage iron 08/24/2019  . Nausea 07/02/2019  . Chronic pain syndrome   . Sickle cell crisis (HCC) 11/26/2018  . Chest pain 11/25/2018  . Vaso-occlusive sickle cell crisis (HCC) 11/25/2018  . Sickle cell pain crisis (HCC) 11/24/2018  . Chronic, continuous use of opioids 10/27/2017  . 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 TIA (transient ischemic attack)   . Vertigo 09/07/2016  . Dyspnea 09/07/2016  . Lightheadedness 09/06/2016  . Healthcare maintenance 07/30/2016  . Dizziness, nonspecific 07/30/2016  . Left foot drop 07/30/2016  . Left-sided weakness 07/30/2016  . 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 History of stroke associated with blood clotting tendency 01/02/2016  . 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 Hb-SS disease with acute chest syndrome (HCC) 08/14/2015  . Acute chest syndrome (HCC)   . Acute respiratory failure with hypoxemia (HCC)   . Community acquired pneumonia 08/08/2015  . Hemiparesis following cerebrovascular accident (CVA) (HCC) 05/30/2015  . Ankle gives out 05/30/2015  . Seizure disorder (HCC) 05/30/2015  . 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 H/O: stroke 03/12/2015  . Seizures (HCC) 03/12/2015  . Sickle cell disease (HCC) 03/11/2015  . Symptomatic  anemia 03/11/2015    Past Medical History:  Diagnosis Date  . Anemia   . Blood transfusion without reported diagnosis   . Migraine 05/2019  . Seizures (HCC)    one time incident, may have been r/t to a pain medication Debra Williamson was prescribed  . Sickle cell disease (HCC)   . Stroke [facility])    Current Status: Since Debra Williamson last office visit, Debra Williamson is doing well with no complaints. Debra Williamson states that Debra Williamson has pain increasing pain in 'bones' of Debra Williamson arms and back pain more often lately. Debra Williamson states that Debra Williamson is running out of medication about a week earlier. Debra Williamson rates Debra Williamson pain today at 5/10. Debra Williamson has not had a hospital visit for Sickle Cell Crisis since 11/25/2018 where Debra Williamson was treated and discharged on 12/02/2019. Debra Williamson is currently taking all medications as prescribed and staying well hydrated. Debra Williamson reports occasional nausea, constipation, dizziness and headaches. Debra Williamson is accompanied by Debra Williamson daughter today. Debra Williamson recently followed up with Hematologist this past week. Debra Williamson denies fevers, chills, fatigue, recent infections, weight loss, and night sweats. Debra Williamson has not had any headaches, visual changes, dizziness, and falls. No chest pain, heart palpitations, cough and shortness of breath reported. Denies GI problems such as nausea, vomiting, diarrhea, and constipation. Debra Williamson has no reports of blood in stools, dysuria and hematuria. No depression or anxiety, and denies suicidal ideations, homicidal ideations, or auditory hallucinations. Debra Williamson is taking all medications as prescribed.    Past Surgical History:  Procedure Laterality Date  . CESAREAN SECTION     x4  . CHOLECYSTECTOMY    . PORTACATH  PLACEMENT Right w-3  . reverse tubal ligation    . TEE WITHOUT CARDIOVERSION N/A 09/10/2016   Procedure: TRANSESOPHAGEAL ECHOCARDIOGRAM (TEE);  Surgeon: Thurmon Fair, MD;  Location: [facility] ENDOSCOPY;  Service: Cardiovascular;  Laterality: N/A;    Family History  Problem Relation Age of Onset  . HIV/AIDS Father     Social History    Socioeconomic History  . Marital status: Married    Spouse name: Not on file  . Number of children: 4  . Years of education: Not on file  . Highest education level: Not on file  Occupational History  . Occupation: Doesn't work  Tobacco Use  . Smoking status: Never Smoker  . Smokeless tobacco: Never Used  Vaping Use  . Vaping Use: Never used  Substance and Sexual Activity  . Alcohol use: No    Alcohol/week: 0.0 standard drinks    Comment: occasionally  . Drug use: No  . Sexual activity: Yes  Other Topics Concern  . Not on file  Social History Narrative  . Not on file   Social Determinants of Health   Financial Resource Strain:   . Difficulty of Paying Living Expenses:   Food Insecurity:   . Worried About Programme researcher, broadcasting/film/video in the Last Year:   . Barista in the Last Year:   Transportation Needs:   . Freight forwarder (Medical):   Marland Kitchen Lack of Transportation (Non-Medical):   Physical Activity:   . Days of Exercise per Week:   . Minutes of Exercise per Session:   Stress:   . Feeling of Stress :   Social Connections:   . Frequency of Communication with Friends and Family:   . Frequency of Social Gatherings with Friends and Family:   . Attends Religious Services:   . Active Member of Clubs or Organizations:   . Attends Banker Meetings:   Marland Kitchen Marital Status:   Intimate Partner Violence:   . Fear of Current or Ex-Partner:   . Emotionally Abused:   Marland Kitchen Physically Abused:   . Sexually Abused:     Outpatient Medications Prior to Visit  Medication Sig Dispense Refill  . albuterol (VENTOLIN HFA) 108 (90 Base) MCG/ACT inhaler INHALE 2 PUFFS BY MOUTH INTO THE LUNGS EVERY 4 HOURS AS NEEDED FOR WHEEZING OR SHORTNESS OF BREATH AND COUGH 8.5 g 11  . ascorbic acid (VITAMIN C) 1000 MG tablet Take by mouth.    Marland Kitchen aspirin EC 81 MG tablet Take 1 tablet by mouth every evening.     . calcium carbonate (OS-CAL) 600 MG TABS tablet Take one tablet once daily after  finishing the Drisdol.    . cetirizine (ZYRTEC) 10 MG tablet Take 1 tablet (10 mg total) by mouth daily. 30 tablet 11  . folic acid (FOLVITE) 1 MG tablet Take 1 mg by mouth daily.     . hydrOXYzine (ATARAX/VISTARIL) 25 MG tablet Take 0.5-1 tablets (12.5-25 mg total) by mouth every 8 (eight) hours as needed for itching. 60 tablet 6  . lamoTRIgine (LAMICTAL) 200 MG tablet TAKE 1 TABLET (200 MG TOTAL) BY MOUTH 2 TIMES DAILY. 180 tablet 3  . levETIRAcetam (KEPPRA) 1000 MG tablet Take 1 tablet (1,000 mg total) by mouth 2 (two) times daily. 180 tablet 3  . lidocaine-prilocaine (EMLA) cream APPLY TO THE AFFECTED AREA(S) AS NEEDED 30 g 3  . promethazine (PHENERGAN) 25 MG tablet Take 1 tablet (25 mg total) by mouth every 8 (eight) hours as needed for nausea  or vomiting. 1 tablet every 8 hours prn 30 tablet 2  . zolpidem (AMBIEN) 5 MG tablet TAKE 1 TABLET BY MOUTH AT BEDTIME AS NEEDED FOR SLEEP 30 tablet 0  . Deferasirox 360 MG TABS TAKE 2 TABLETS BY MOUTH ONCE A DAY (Patient not taking: Reported on 05/24/2020)    . diclofenac sodium (VOLTAREN) 1 % GEL Apply 2 g topically 4 (four) times daily. (Patient not taking: Reported on 05/24/2020) 100 g 1  . meloxicam (MOBIC) 15 MG tablet Take 1 tablet (15 mg total) by mouth daily. (Patient not taking: Reported on 05/24/2020) 30 tablet 3  . omeprazole (PRILOSEC) 40 MG capsule Take 1 capsule (40 mg total) by mouth daily. (Patient not taking: Reported on 05/24/2020) 30 capsule 6  . topiramate (TOPAMAX) 25 MG tablet Take 1 tablet (25 mg total) by mouth 2 (two) times daily as needed. (Patient not taking: Reported on 05/24/2020) 30 tablet 2   No facility-administered medications prior to visit.    Allergies  Allergen Reactions  . Zofran [Ondansetron Hcl] Hives and Itching  . Demerol [Meperidine] Other (See Comments)    Pt has seizures  . Fentanyl And Related Nausea And Vomiting and Other (See Comments)    Very sedated  **Not allergic to the injection** JUST ALLERGIC TO PATCH   . Other Itching    Greek Yogurt  . Codeine Hives and Itching  . Hydrocodone Hives and Itching  . Ibuprofen     ROS Review of Systems  Constitutional: Negative.   HENT: Negative.   Eyes: Negative.   Respiratory: Negative.   Cardiovascular: Negative.   Gastrointestinal: Positive for abdominal distention.  Endocrine: Negative.   Genitourinary: Negative.   Musculoskeletal: Positive for arthralgias (generalized joint pain).  Skin: Negative.   Allergic/Immunologic: Negative.   Neurological: Positive for dizziness (occasional ) and headaches (occasional ).  Hematological: Negative.   Psychiatric/Behavioral: Negative.       Objective:    Physical Exam Constitutional:      Appearance: Normal appearance.  HENT:     Head: Normocephalic and atraumatic.     Nose: Nose normal.     Mouth/Throat:     Mouth: Mucous membranes are moist.     Pharynx: Oropharynx is clear.  Cardiovascular:     Rate and Rhythm: Normal rate and regular rhythm.     Pulses: Normal pulses.     Heart sounds: Normal heart sounds.  Pulmonary:     Effort: Pulmonary effort is normal.     Breath sounds: Normal breath sounds.  Abdominal:     General: Bowel sounds are normal.     Palpations: Abdomen is soft.  Musculoskeletal:        General: Normal range of motion.     Cervical back: Normal range of motion and neck supple.  Skin:    General: Skin is warm and dry.  Neurological:     General: No focal deficit present.     Mental Status: Debra Williamson is alert and oriented to person, place, and time.  Psychiatric:        Mood and Affect: Mood normal.        Behavior: Behavior normal.        Thought Content: Thought content normal.        Judgment: Judgment normal.     BP 112/63 (BP Location: Right Arm, Patient Position: Sitting, Cuff Size: Normal)   Pulse 64   Temp (!) 97.3 F (36.3 C)   Resp 17   Ht 5\' 4"  (  1.626 m)   Wt 177 lb 9.6 oz (80.6 kg)   SpO2 100%   BMI 30.48 kg/m  Wt Readings from Last 3  Encounters:  05/24/20 177 lb 9.6 oz (80.6 kg)  02/22/20 178 lb 12.8 oz (81.1 kg)  12/07/19 172 lb 9.6 oz (78.3 kg)     Health Maintenance Due  Topic Date Due  . Hepatitis C Screening  Never done  . COVID-19 Vaccine (1) Never done  . PAP SMEAR-Modifier  05/13/2020  . INFLUENZA VACCINE  05/21/2020    There are no preventive care reminders to display for this patient.  Lab Results  Component Value Date   TSH 0.668 06/21/2016   Lab Results  Component Value Date   WBC 9.3 12/08/2018   HGB 7.7 (L) 12/08/2018   HCT 24.4 (L) 12/08/2018   MCV 88.7 12/08/2018   PLT 811 (H) 12/08/2018   Lab Results  Component Value Date   NA 137 11/30/2018   K 4.2 11/30/2018   CO2 24 11/30/2018   GLUCOSE 88 11/30/2018   BUN 10 11/30/2018   CREATININE 0.67 11/30/2018   BILITOT 2.3 (H) 11/30/2018   ALKPHOS 64 11/30/2018   AST 33 11/30/2018   ALT 25 11/30/2018   PROT 7.7 11/30/2018   ALBUMIN 4.4 11/30/2018   CALCIUM 8.9 11/30/2018   ANIONGAP 10 11/30/2018   Lab Results  Component Value Date   CHOL 128 09/07/2016   Lab Results  Component Value Date   HDL 26 (L) 09/07/2016   Lab Results  Component Value Date   LDLCALC 60 09/07/2016   Lab Results  Component Value Date   TRIG 211 (H) 09/07/2016   Lab Results  Component Value Date   CHOLHDL 4.9 09/07/2016   Lab Results  Component Value Date   HGBA1C <4.2 (L) 09/07/2016    Assessment & Plan:   1. Hb-SS disease without crisis [facility]) Debra Williamson is doing fairly well today r/t Debra Williamson chronic pain management. Debra Williamson does report that Debra Williamson has been experiencing more bone pain lately. Debra Williamson will continue to take pain medications as prescribed; will continue to avoid extreme heat and cold; will continue to eat a healthy diet and drink at least 64 ounces of water daily; continue stool softener as needed; will avoid colds and flu; will continue to get plenty of sleep and rest; will continue to avoid high stressful situations and remain infection free; will  continue Folic Acid 1 mg daily to avoid sickle cell crisis. Continue to follow up with Hematologist as needed.  - Urinalysis Dipstick - Oxycodone HCl 10 MG TABS; Take 1 tablet (10 mg total) by mouth every 4 (four) hours as needed for up to 15 days (pain).  Dispense: 90 tablet; Refill: 0 - OXYCONTIN 30 MG 12 hr tablet; Take 1 tablet (30 mg total) by mouth 2 (two) times daily.  Dispense: 60 tablet; Refill: 0  2. Chronic pain syndrome - Oxycodone HCl 10 MG TABS; Take 1 tablet (10 mg total) by mouth every 4 (four) hours as needed for up to 15 days (pain).  Dispense: 90 tablet; Refill: 0 - OXYCONTIN 30 MG 12 hr tablet; Take 1 tablet (30 mg total) by mouth 2 (two) times daily.  Dispense: 60 tablet; Refill: 0  3. Chronic, continuous use of opioids - Oxycodone HCl 10 MG TABS; Take 1 tablet (10 mg total) by mouth every 4 (four) hours as needed for up to 15 days (pain).  Dispense: 90 tablet; Refill: 0 - OXYCONTIN 30 MG 12  hr tablet; Take 1 tablet (30 mg total) by mouth 2 (two) times daily.  Dispense: 60 tablet; Refill: 0 - [id number] 11+Oxyco+Alc+Crt-Bund  4. Medication management - Oxycodone HCl 10 MG TABS; Take 1 tablet (10 mg total) by mouth every 4 (four) hours as needed for up to 15 days (pain).  Dispense: 90 tablet; Refill: 0 - OXYCONTIN 30 MG 12 hr tablet; Take 1 tablet (30 mg total) by mouth 2 (two) times daily.  Dispense: 60 tablet; Refill: 0  5. Follow up Debra Williamson will follow up in 2 months.   Meds ordered this encounter  Medications  . Oxycodone HCl 10 MG TABS    Sig: Take 1 tablet (10 mg total) by mouth every 4 (four) hours as needed for up to 15 days (pain).    Dispense:  90 tablet    Refill:  0    Order Specific Question:   Supervising Provider    Answer:   Quentin AngstJEGEDE, OLUGBEMIGA E [id number]]  . OXYCONTIN 30 MG 12 hr tablet    Sig: Take 1 tablet (30 mg total) by mouth 2 (two) times daily.    Dispense:  60 tablet    Refill:  0    Order Specific Question:   Supervising Provider    Answer:    Quentin AngstJEGEDE, OLUGBEMIGA E [id number]]    Orders Placed This Encounter  Procedures  . [id number] 11+Oxyco+Alc+Crt-Bund  . Opiates Confirmation, Urine  . Oxycodone/Oxymorphone, Confirm  . Urinalysis Dipstick    Referral Orders  No referral(s) requested today    Raliegh IpNatalie Jerie Basford,  MSN, FNP-BC [facility] [address]  [place], [place] [id number] [phone] fax  Problem List Items Addressed This Visit      Other   Chronic pain syndrome   Relevant Medications   Oxycodone HCl 10 MG TABS   OXYCONTIN 30 MG 12 hr tablet   Chronic, continuous use of opioids   Relevant Medications   Oxycodone HCl 10 MG TABS   OXYCONTIN 30 MG 12 hr tablet   Other Relevant Orders   [id number] 11+Oxyco+Alc+Crt-Bund (Completed)   Hb-SS disease without crisis (HCC) - Primary   Relevant Medications   Oxycodone HCl 10 MG TABS   OXYCONTIN 30 MG 12 hr tablet   Other Relevant Orders   Urinalysis Dipstick (Completed)    Other Visit Diagnoses    Medication management       Relevant Medications   Oxycodone HCl 10 MG TABS   OXYCONTIN 30 MG 12 hr tablet   Follow up          Meds ordered this encounter  Medications  . Oxycodone HCl 10 MG TABS    Sig: Take 1 tablet (10 mg total) by mouth every 4 (four) hours as needed for up to 15 days (pain).    Dispense:  90 tablet    Refill:  0    Order Specific Question:   Supervising Provider    Answer:   Quentin AngstJEGEDE, OLUGBEMIGA E [id number]]  . OXYCONTIN 30 MG 12 hr tablet    Sig: Take 1 tablet (30 mg total) by mouth 2 (two) times daily.    Dispense:  60 tablet    Refill:  0    Order Specific Question:   Supervising Provider    Answer:   Quentin AngstJEGEDE, OLUGBEMIGA E [[id number]]    Follow-up: Return in about 2 months (around 07/24/2020).    Kallie LocksNatalie M Seth Friedlander, FNP

## 2020-05-29 ENCOUNTER — Encounter: Payer: Self-pay | Admitting: Family Medicine

## 2020-05-30 LAB — OXYCODONE/OXYMORPHONE, CONFIRM
OXYCODONE/OXYMORPH: POSITIVE — AB
OXYCODONE: 2388 ng/mL
OXYCODONE: POSITIVE — AB
OXYMORPHONE (GC/MS): 1356 ng/mL
OXYMORPHONE: POSITIVE — AB

## 2020-05-30 LAB — DRUG SCREEN 764883 11+OXYCO+ALC+CRT-BUND
Amphetamines, Urine: NEGATIVE ng/mL
BENZODIAZ UR QL: NEGATIVE ng/mL
Barbiturate: NEGATIVE ng/mL
Cannabinoid Quant, Ur: NEGATIVE ng/mL
Cocaine (Metabolite): NEGATIVE ng/mL
Creatinine: 55.2 mg/dL (ref 20.0–300.0)
Ethanol: NEGATIVE %
Meperidine: NEGATIVE ng/mL
Methadone Screen, Urine: NEGATIVE ng/mL
Phencyclidine: NEGATIVE ng/mL
Propoxyphene: NEGATIVE ng/mL
Tramadol: NEGATIVE ng/mL
pH, Urine: 5.3 (ref 4.5–8.9)

## 2020-05-30 LAB — OPIATES CONFIRMATION, URINE: Opiates: NEGATIVE ng/mL

## 2020-05-31 ENCOUNTER — Encounter: Payer: Self-pay | Admitting: Family Medicine

## 2020-05-31 ENCOUNTER — Telehealth: Payer: Self-pay | Admitting: Family Medicine

## 2020-06-01 ENCOUNTER — Other Ambulatory Visit: Payer: Self-pay | Admitting: Family Medicine

## 2020-06-01 DIAGNOSIS — G4700 Insomnia, unspecified: Secondary | ICD-10-CM

## 2020-06-01 MED ORDER — ZOLPIDEM TARTRATE 5 MG PO TABS: 5.0000 mg | ORAL_TABLET | Freq: Every evening | ORAL | 0 refills | Status: DC | PRN

## 2020-06-02 DIAGNOSIS — D571 Sickle-cell disease without crisis: Secondary | ICD-10-CM | POA: Diagnosis not present

## 2020-06-02 NOTE — Telephone Encounter (Signed)
Done

## 2020-06-03 MED FILL — ZOLPIDEM TARTRATE 5 MG TAB: 5 | 30 days supply | Qty: 30 | Fill #0

## 2020-06-05 DIAGNOSIS — D571 Sickle-cell disease without crisis: Secondary | ICD-10-CM | POA: Diagnosis not present

## 2020-06-06 ENCOUNTER — Telehealth (INDEPENDENT_AMBULATORY_CARE_PROVIDER_SITE_OTHER): Payer: Medicaid Other | Admitting: Family Medicine

## 2020-06-06 ENCOUNTER — Encounter: Payer: Self-pay | Admitting: Family Medicine

## 2020-06-06 DIAGNOSIS — G894 Chronic pain syndrome: Secondary | ICD-10-CM | POA: Diagnosis not present

## 2020-06-06 DIAGNOSIS — F1190 Opioid use, unspecified, uncomplicated: Secondary | ICD-10-CM | POA: Diagnosis not present

## 2020-06-06 DIAGNOSIS — R29898 Other symptoms and signs involving the musculoskeletal system: Secondary | ICD-10-CM | POA: Diagnosis not present

## 2020-06-06 DIAGNOSIS — R569 Unspecified convulsions: Secondary | ICD-10-CM

## 2020-06-06 DIAGNOSIS — D571 Sickle-cell disease without crisis: Secondary | ICD-10-CM | POA: Diagnosis not present

## 2020-06-06 DIAGNOSIS — Z09 Encounter for follow-up examination after completed treatment for conditions other than malignant neoplasm: Secondary | ICD-10-CM

## 2020-06-06 DIAGNOSIS — G4700 Insomnia, unspecified: Secondary | ICD-10-CM

## 2020-06-06 NOTE — Progress Notes (Signed)
Virtual Visit via Telephone Note  I connected with Debra Williamson on 06/06/20 at  3:00 PM EDT by telephone and verified that I am speaking with the correct person using two identifiers.   I discussed the limitations, risks, security and privacy concerns of performing an evaluation and management service by telephone and the availability of in person appointments. I also discussed with the patient that there may be a patient responsible charge related to this service. The patient expressed understanding and agreed to proceed.  Televisit Today Patient Location: Home Provider Location: Office   History of Present Illness:  Past Surgical History:  Procedure Laterality Date  . CESAREAN SECTION     x4  . CHOLECYSTECTOMY    . PORTACATH PLACEMENT Right w-3  . reverse tubal ligation    . TEE WITHOUT CARDIOVERSION N/A 09/10/2016   Procedure: TRANSESOPHAGEAL ECHOCARDIOGRAM (TEE);  Surgeon: Thurmon Fair, MD;  Location: [facility] ENDOSCOPY;  Service: Cardiovascular;  Laterality: N/A;    Social History   Socioeconomic History  . Marital status: Married    Spouse name: Not on file  . Number of children: 4  . Years of education: Not on file  . Highest education level: Not on file  Occupational History  . Occupation: Doesn't work  Tobacco Use  . Smoking status: Never Smoker  . Smokeless tobacco: Never Used  Vaping Use  . Vaping Use: Never used  Substance and Sexual Activity  . Alcohol use: No    Alcohol/week: 0.0 standard drinks    Comment: occasionally  . Drug use: No  . Sexual activity: Yes  Other Topics Concern  . Not on file  Social History Narrative  . Not on file   Social Determinants of Health   Financial Resource Strain:   . Difficulty of Paying Living Expenses:   Food Insecurity:   . Worried About Programme researcher, broadcasting/film/video in the Last Year:   . Barista in the Last Year:   Transportation Needs:   . Freight forwarder (Medical):   Marland Kitchen Lack of Transportation  (Non-Medical):   Physical Activity:   . Days of Exercise per Week:   . Minutes of Exercise per Session:   Stress:   . Feeling of Stress :   Social Connections:   . Frequency of Communication with Friends and Family:   . Frequency of Social Gatherings with Friends and Family:   . Attends Religious Services:   . Active Member of Clubs or Organizations:   . Attends Banker Meetings:   Marland Kitchen Marital Status:   Intimate Partner Violence:   . Fear of Current or Ex-Partner:   . Emotionally Abused:   Marland Kitchen Physically Abused:   . Sexually Abused:     Family History  Problem Relation Age of Onset  . HIV/AIDS Father     Past Medical History:  Diagnosis Date  . Anemia   . Blood transfusion without reported diagnosis   . Migraine 05/2019  . Seizures (HCC)    one time incident, may have been r/t to a pain medication she was prescribed  . Sickle cell disease (HCC)   . Stroke [facility])     Current Outpatient Medications on File Prior to Visit  Medication Sig Dispense Refill  . albuterol (VENTOLIN HFA) 108 (90 Base) MCG/ACT inhaler INHALE 2 PUFFS BY MOUTH INTO THE LUNGS EVERY 4 HOURS AS NEEDED FOR WHEEZING OR SHORTNESS OF BREATH AND COUGH 8.5 g 11  . ascorbic acid (VITAMIN C) 1000 MG  tablet Take by mouth.    Marland Kitchen aspirin EC 81 MG tablet Take 1 tablet by mouth every evening.     . calcium carbonate (OS-CAL) 600 MG TABS tablet Take one tablet once daily after finishing the Drisdol.    . cetirizine (ZYRTEC) 10 MG tablet Take 1 tablet (10 mg total) by mouth daily. 30 tablet 11  . Deferasirox 360 MG TABS TAKE 2 TABLETS BY MOUTH ONCE A DAY    . diclofenac sodium (VOLTAREN) 1 % GEL Apply 2 g topically 4 (four) times daily. 100 g 1  . folic acid (FOLVITE) 1 MG tablet Take 1 mg by mouth daily.     . hydrOXYzine (ATARAX/VISTARIL) 25 MG tablet Take 0.5-1 tablets (12.5-25 mg total) by mouth every 8 (eight) hours as needed for itching. 60 tablet 6  . lamoTRIgine (LAMICTAL) 200 MG tablet TAKE 1 TABLET  (200 MG TOTAL) BY MOUTH 2 TIMES DAILY. 180 tablet 3  . levETIRAcetam (KEPPRA) 1000 MG tablet Take 1 tablet (1,000 mg total) by mouth 2 (two) times daily. 180 tablet 3  . lidocaine-prilocaine (EMLA) cream APPLY TO THE AFFECTED AREA(S) AS NEEDED 30 g 3  . meloxicam (MOBIC) 15 MG tablet Take 1 tablet (15 mg total) by mouth daily. 30 tablet 3  . omeprazole (PRILOSEC) 40 MG capsule Take 1 capsule (40 mg total) by mouth daily. 30 capsule 6  . Oxycodone HCl 10 MG TABS Take 1 tablet (10 mg total) by mouth every 4 (four) hours as needed for up to 15 days (pain). 90 tablet 0  . OXYCONTIN 30 MG 12 hr tablet Take 1 tablet (30 mg total) by mouth 2 (two) times daily. 60 tablet 0  . promethazine (PHENERGAN) 25 MG tablet Take 1 tablet (25 mg total) by mouth every 8 (eight) hours as needed for nausea or vomiting. 1 tablet every 8 hours prn 30 tablet 2  . topiramate (TOPAMAX) 25 MG tablet Take 1 tablet (25 mg total) by mouth 2 (two) times daily as needed. 30 tablet 2  . zolpidem (AMBIEN) 5 MG tablet Take 1 tablet (5 mg total) by mouth at bedtime as needed. for sleep 30 tablet 0   No current facility-administered medications on file prior to visit.    Allergies  Allergen Reactions  . Zofran [Ondansetron Hcl] Hives and Itching  . Demerol [Meperidine] Other (See Comments)    Pt has seizures  . Fentanyl And Related Nausea And Vomiting and Other (See Comments)    Very sedated  **Not allergic to the injection** JUST ALLERGIC TO PATCH  . Other Itching    Greek Yogurt  . Codeine Hives and Itching  . Hydrocodone Hives and Itching  . Ibuprofen     Current Status: Since her last office visit, she is doing well with no complaints. She states that she has pain in her back and ankle. She rates her pain today at 5/10. She has not had a hospital visit for Sickle Cell Crisis since 11/2018 where she was treated and discharged the same day. She is currently taking all medications as prescribed and staying well hydrated.  She reports occasional nausea, constipation, dizziness and headaches.  Signs and symptoms of seizure activity, with twitching of left lip. She has recently been taking anti-seizure medication. She denies fevers, chills, fatigue, recent infections, weight loss, and night sweats. She has not had any visual changes, and falls. No chest pain, heart palpitations, cough and shortness of breath reported. Denies GI problems such as vomiting, and diarrhea.  She has no reports of blood in stools, dysuria and hematuria. No depression or anxiety reported. She is taking all medications as prescribed. She denies pain today.   Observations/Objective:  Telephone Visit   Assessment and Plan:  1. Hb-SS disease without crisis [facility]) She is doing well today r/t her chronic pain management. She will continue to take pain medications as prescribed; will continue to avoid extreme heat and cold; will continue to eat a healthy diet and drink at least 64 ounces of water daily; continue stool softener as needed; will avoid colds and flu; will continue to get plenty of sleep and rest; will continue to avoid high stressful situations and remain infection free; will continue Folic Acid 1 mg daily to avoid sickle cell crisis. Continue to follow up with Hematologist as needed.   2. Chronic pain syndrome  3. Chronic, continuous use of opioids  4. Weakness of lower extremity, unspecified laterality  5. Insomnia, unspecified type Continue Ativan as needed.   6. Seizures (HCC) We will refer her to Neurology for re-assessment.   7. Increased storage iron She continues Deferasirox tablets as needed.   8. Follow up Keep follow up appointment.   No orders of the defined types were placed in this encounter.   No orders of the defined types were placed in this encounter.   Referral Orders  No referral(s) requested today    Raliegh Ip,  MSN, FNP-BC [facility]/Internal Medicine/Sickle Cell  [facility] [address] [place], [place] [address] [phone] fax  I discussed the assessment and treatment plan with the patient. The patient was provided an opportunity to ask questions and all were answered. The patient agreed with the plan and demonstrated an understanding of the instructions.   The patient was advised to call back or seek an in-person evaluation if the symptoms worsen or if the condition fails to improve as anticipated.  I provided 20 minutes of non-face-to-face time during this encounter.   Kallie Locks, FNP

## 2020-06-08 ENCOUNTER — Encounter: Payer: Self-pay | Admitting: Neurology

## 2020-06-21 ENCOUNTER — Other Ambulatory Visit: Payer: Self-pay | Admitting: Family Medicine

## 2020-06-21 ENCOUNTER — Telehealth: Payer: Self-pay | Admitting: Family Medicine

## 2020-06-21 DIAGNOSIS — Z79899 Other long term (current) drug therapy: Secondary | ICD-10-CM

## 2020-06-21 DIAGNOSIS — D571 Sickle-cell disease without crisis: Secondary | ICD-10-CM

## 2020-06-21 DIAGNOSIS — F1190 Opioid use, unspecified, uncomplicated: Secondary | ICD-10-CM

## 2020-06-21 DIAGNOSIS — G894 Chronic pain syndrome: Secondary | ICD-10-CM

## 2020-06-21 MED ORDER — OXYCODONE HCL 10 MG PO TABS: 10.0000 mg | ORAL_TABLET | ORAL | 0 refills | Status: DC | PRN

## 2020-06-21 MED ORDER — OXYCONTIN 30 MG PO T12A: 1.0000 | EXTENDED_RELEASE_TABLET | Freq: Two times a day (BID) | ORAL | 0 refills | Status: DC

## 2020-06-22 MED FILL — OxyCONTIN 30 MG T12A: 30 | 30 days supply | Qty: 60 | Fill #0

## 2020-06-22 NOTE — Telephone Encounter (Signed)
Done

## 2020-06-23 ENCOUNTER — Telehealth: Payer: Self-pay | Admitting: Family Medicine

## 2020-06-23 MED FILL — oxyCODONE HCL 10 MG TABS: 10 | 15 days supply | Qty: 90 | Fill #0

## 2020-06-23 NOTE — Telephone Encounter (Signed)
Sen to [facility]

## 2020-06-30 ENCOUNTER — Other Ambulatory Visit: Payer: Self-pay | Admitting: Family Medicine

## 2020-06-30 ENCOUNTER — Telehealth: Payer: Self-pay | Admitting: Family Medicine

## 2020-06-30 DIAGNOSIS — G4700 Insomnia, unspecified: Secondary | ICD-10-CM

## 2020-06-30 DIAGNOSIS — D571 Sickle-cell disease without crisis: Secondary | ICD-10-CM | POA: Diagnosis not present

## 2020-06-30 MED ORDER — ZOLPIDEM TARTRATE 5 MG PO TABS: 5.0000 mg | ORAL_TABLET | Freq: Every evening | ORAL | 0 refills | Status: DC | PRN

## 2020-07-03 DIAGNOSIS — D571 Sickle-cell disease without crisis: Secondary | ICD-10-CM | POA: Diagnosis not present

## 2020-07-03 MED FILL — ZOLPIDEM TARTRATE 5 MG TABS: 5 | 30 days supply | Qty: 30 | Fill #0

## 2020-07-03 NOTE — Telephone Encounter (Signed)
Sent to Provider 

## 2020-07-13 ENCOUNTER — Telehealth: Payer: Self-pay | Admitting: Clinical

## 2020-07-13 NOTE — Telephone Encounter (Signed)
Integrated Behavioral Health Case Management Referral Note  07/13/2020 Name: Ziyah Cordoba MRN: [id number] DOB: 08-09-1981 Joslynne Klatt is a 39 y.o. year old female who sees Kallie Locks, FNP for primary care. LCSW was consulted by patient for housing resources and rental assistance.  Interpreter: No.   Interpreter Name & Language: none  Assessment: Patient experiencing Financial constraints related to low income. Housing barriers. Patient called CSW and requested support in finding rental assistance. Patient and her husband are several months behind on their rent and the federal evicition moratorium is ending soon. Patient's husband previously worked but recently had a surgery and is out of work. Patient is well connected with Child psychotherapist at case worker at [employer] and [facility]), who are also assisting with this issue. They have referred patient to a rental assistance program. They had also assisted patient and her family with finds to move into this current apartment.   Intervention: CSW consulted with Child psychotherapist at [employer]. Social worker, Mindi Junker is continuing to follow patient and follow up on referrals provided. CSW provided additional resources and emailed information to patient: [facility] [employer], [employer] COVID-19 Housing Assistance, [employer], and [employer] [place].   Review of patient status, including review of consultants reports, relevant laboratory and other test results, and collaboration with appropriate care team members and the patient's provider was performed as part of comprehensive patient evaluation and provision of services.    SDOH (Social Determinants of Health) assessments performed: No  Outpatient Encounter Medications as of 07/13/2020  Medication Sig Note   albuterol (VENTOLIN HFA) 108 (90 Base) MCG/ACT inhaler INHALE 2 PUFFS BY MOUTH INTO THE LUNGS EVERY 4 HOURS  AS NEEDED FOR WHEEZING OR SHORTNESS OF BREATH AND COUGH    ascorbic acid (VITAMIN C) 1000 MG tablet Take by mouth.    aspirin EC 81 MG tablet Take 1 tablet by mouth every evening.  03/11/2015: .    calcium carbonate (OS-CAL) 600 MG TABS tablet Take one tablet once daily after finishing the Drisdol.    cetirizine (ZYRTEC) 10 MG tablet Take 1 tablet (10 mg total) by mouth daily.    Deferasirox 360 MG TABS TAKE 2 TABLETS BY MOUTH ONCE A DAY    diclofenac sodium (VOLTAREN) 1 % GEL Apply 2 g topically 4 (four) times daily. 03/05/2019: As needed.    folic acid (FOLVITE) 1 MG tablet Take 1 mg by mouth daily.  03/11/2015: .    hydrOXYzine (ATARAX/VISTARIL) 25 MG tablet Take 0.5-1 tablets (12.5-25 mg total) by mouth every 8 (eight) hours as needed for itching.    lamoTRIgine (LAMICTAL) 200 MG tablet TAKE 1 TABLET (200 MG TOTAL) BY MOUTH 2 TIMES DAILY.    levETIRAcetam (KEPPRA) 1000 MG tablet Take 1 tablet (1,000 mg total) by mouth 2 (two) times daily.    lidocaine-prilocaine (EMLA) cream APPLY TO THE AFFECTED AREA(S) AS NEEDED    meloxicam (MOBIC) 15 MG tablet Take 1 tablet (15 mg total) by mouth daily. 08/23/2019: As needed    omeprazole (PRILOSEC) 40 MG capsule Take 1 capsule (40 mg total) by mouth daily.    OXYCONTIN 30 MG 12 hr tablet Take 1 tablet (30 mg total) by mouth 2 (two) times daily.    promethazine (PHENERGAN) 25 MG tablet Take 1 tablet (25 mg total) by mouth every 8 (eight) hours as needed for nausea or vomiting. 1 tablet every 8 hours prn    topiramate (TOPAMAX) 25 MG  tablet Take 1 tablet (25 mg total) by mouth 2 (two) times daily as needed. 02/22/2020: As needed   zolpidem (AMBIEN) 5 MG tablet Take 1 tablet (5 mg total) by mouth at bedtime as needed. for sleep    No facility-administered encounter medications on file as of 07/13/2020.    Goals Addressed   None      Follow up Plan: CSW available from clinic as needed to coordinate with [facility] social worker and with community  resources  Abigail Butts, LCSW Patient [facility] Center [facility] [phone]

## 2020-07-24 ENCOUNTER — Ambulatory Visit: Payer: Medicaid Other | Admitting: Family Medicine

## 2020-07-26 ENCOUNTER — Encounter: Payer: Self-pay | Admitting: Family Medicine

## 2020-07-26 ENCOUNTER — Other Ambulatory Visit: Payer: Self-pay

## 2020-07-26 ENCOUNTER — Other Ambulatory Visit: Payer: Self-pay | Admitting: Family Medicine

## 2020-07-26 ENCOUNTER — Ambulatory Visit (INDEPENDENT_AMBULATORY_CARE_PROVIDER_SITE_OTHER): Payer: Medicaid Other | Admitting: Family Medicine

## 2020-07-26 VITALS — BP 126/80 | HR 82 | Temp 97.7°F | Resp 17 | Ht 64.0 in | Wt 181.0 lb

## 2020-07-26 DIAGNOSIS — R569 Unspecified convulsions: Secondary | ICD-10-CM

## 2020-07-26 DIAGNOSIS — Z79899 Other long term (current) drug therapy: Secondary | ICD-10-CM | POA: Diagnosis not present

## 2020-07-26 DIAGNOSIS — Z09 Encounter for follow-up examination after completed treatment for conditions other than malignant neoplasm: Secondary | ICD-10-CM

## 2020-07-26 DIAGNOSIS — F1190 Opioid use, unspecified, uncomplicated: Secondary | ICD-10-CM | POA: Diagnosis not present

## 2020-07-26 DIAGNOSIS — G894 Chronic pain syndrome: Secondary | ICD-10-CM

## 2020-07-26 DIAGNOSIS — D571 Sickle-cell disease without crisis: Secondary | ICD-10-CM | POA: Diagnosis not present

## 2020-07-26 DIAGNOSIS — R29898 Other symptoms and signs involving the musculoskeletal system: Secondary | ICD-10-CM

## 2020-07-26 MED ORDER — OXYCODONE HCL 10 MG PO TABS: 10.0000 mg | ORAL_TABLET | ORAL | 0 refills | Status: DC | PRN

## 2020-07-26 MED ORDER — OXYCONTIN 30 MG PO T12A: 1.0000 | EXTENDED_RELEASE_TABLET | Freq: Two times a day (BID) | ORAL | 0 refills | Status: DC

## 2020-07-26 MED FILL — oxyCODONE HCL 10 MG TABS: 10 | 15 days supply | Qty: 90 | Fill #0

## 2020-07-26 NOTE — Progress Notes (Signed)
[facility]    Established Patient Office Visit  Subjective:  Patient ID: Debra Williamson, female    DOB: [birth year]  Age: 39 y.o. MRN: [id number]  CC:  Chief Complaint  Patient presents with  . Follow-up    Pt states she has no question or concerns. @ this moment.    HPI  Debra Williamson is a 39 year old female who presents for Follow Up today.   Patient Active Problem List   Diagnosis Date Noted  . Weakness of lower extremity 02/27/2020  . Insomnia 08/24/2019  . Increased storage iron 08/24/2019  . Nausea 07/02/2019  . Chronic pain syndrome   . Sickle cell crisis (HCC) 11/26/2018  . Chest pain 11/25/2018  . Vaso-occlusive sickle cell crisis (HCC) 11/25/2018  . Sickle cell pain crisis (HCC) 11/24/2018  . Chronic, continuous use of opioids 10/27/2017  . 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 TIA (transient ischemic attack)   . Vertigo 09/07/2016  . Dyspnea 09/07/2016  . Lightheadedness 09/06/2016  . Healthcare maintenance 07/30/2016  . Dizziness, nonspecific 07/30/2016  . Left foot drop 07/30/2016  . Left-sided weakness 07/30/2016  . 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 History of stroke associated with blood clotting tendency 01/02/2016  . [place] disease (HCC) 10/27/2015  . Hb-SS disease with acute chest syndrome (HCC) 08/14/2015  . Acute chest syndrome (HCC)   . Acute respiratory failure with hypoxemia (HCC)   . Community acquired pneumonia 08/08/2015  . Hemiparesis following cerebrovascular accident (CVA) (HCC) 05/30/2015  . Ankle gives out 05/30/2015  . Seizure disorder (HCC) 05/30/2015  . 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 H/O: stroke 03/12/2015  . Seizures (HCC) 03/12/2015  . Sickle cell disease (HCC) 03/11/2015  . Symptomatic anemia 03/11/2015    Current Status: Since her last office visit, she is doing well with no complaints. She states that she has pain in her arms and legs. She rates her pain today at 5/10. She has not had a hospital visit for Sickle Cell Crisis since 11/25/2018 where she was treated and discharged on 12/02/2019. She is currently taking all medications as prescribed and staying well hydrated. She reports occasional nausea, constipation, dizziness and headaches. She denies fevers, chills, fatigue, recent infections, weight loss, and night sweats. She has not had any visual changes, and falls. No chest pain, heart palpitations, cough and shortness of breath reported. Denies GI problems such as vomiting, and diarrhea. She has no reports of blood in stools, dysuria and hematuria. No depression or anxiety reported toda. She is taking all medications as prescribed.   Past Medical History:  Diagnosis Date  . Anemia   . Blood transfusion without reported diagnosis   . Migraine 05/2019  . Seizures (HCC)    one time incident, may have been r/t to a pain medication she was prescribed  . Sickle cell disease (HCC)   . Stroke [facility])     Past Surgical History:  Procedure Laterality Date  . CESAREAN SECTION     x4  . CHOLECYSTECTOMY    . PORTACATH PLACEMENT Right w-3  . reverse tubal ligation    . TEE WITHOUT CARDIOVERSION N/A 09/10/2016   Procedure: TRANSESOPHAGEAL ECHOCARDIOGRAM (TEE);  Surgeon: Thurmon Fair, MD;  Location: [facility] ENDOSCOPY;  Service: Cardiovascular;  Laterality: N/A;    Family History  Problem Relation Age of Onset  . HIV/AIDS Father  Social History   Socioeconomic History  . Marital status: Married    Spouse name: Not on file  . Number of children: 4  . Years of education: Not on file  . Highest education level: Not on file  Occupational History  . Occupation: Doesn't work  Tobacco Use  . Smoking status: Never Smoker  . Smokeless tobacco: Never Used  Vaping Use  . Vaping Use: Never used   Substance and Sexual Activity  . Alcohol use: No    Alcohol/week: 0.0 standard drinks    Comment: occasionally  . Drug use: No  . Sexual activity: Yes  Other Topics Concern  . Not on file  Social History Narrative  . Not on file   Social Determinants of Health   Financial Resource Strain:   . Difficulty of Paying Living Expenses: Not on file  Food Insecurity:   . Worried About Programme researcher, broadcasting/film/video in the Last Year: Not on file  . Ran Out of Food in the Last Year: Not on file  Transportation Needs:   . Lack of Transportation (Medical): Not on file  . Lack of Transportation (Non-Medical): Not on file  Physical Activity:   . Days of Exercise per Week: Not on file  . Minutes of Exercise per Session: Not on file  Stress:   . Feeling of Stress : Not on file  Social Connections:   . Frequency of Communication with Friends and Family: Not on file  . Frequency of Social Gatherings with Friends and Family: Not on file  . Attends Religious Services: Not on file  . Active Member of Clubs or Organizations: Not on file  . Attends Banker Meetings: Not on file  . Marital Status: Not on file  Intimate Partner Violence:   . Fear of Current or Ex-Partner: Not on file  . Emotionally Abused: Not on file  . Physically Abused: Not on file  . Sexually Abused: Not on file    Outpatient Medications Prior to Visit  Medication Sig Dispense Refill  . albuterol (VENTOLIN HFA) 108 (90 Base) MCG/ACT inhaler INHALE 2 PUFFS BY MOUTH INTO THE LUNGS EVERY 4 HOURS AS NEEDED FOR WHEEZING OR SHORTNESS OF BREATH AND COUGH 8.5 g 11  . ascorbic acid (VITAMIN C) 1000 MG tablet Take by mouth.    Marland Kitchen aspirin EC 81 MG tablet Take 1 tablet by mouth every evening.     . calcium carbonate (OS-CAL) 600 MG TABS tablet Take one tablet once daily after finishing the Drisdol.    . cetirizine (ZYRTEC) 10 MG tablet Take 1 tablet (10 mg total) by mouth daily. 30 tablet 11  . diclofenac sodium (VOLTAREN) 1 % GEL  Apply 2 g topically 4 (four) times daily. 100 g 1  . folic acid (FOLVITE) 1 MG tablet Take 1 mg by mouth daily.     . hydrOXYzine (ATARAX/VISTARIL) 25 MG tablet Take 0.5-1 tablets (12.5-25 mg total) by mouth every 8 (eight) hours as needed for itching. 60 tablet 6  . lamoTRIgine (LAMICTAL) 200 MG tablet TAKE 1 TABLET (200 MG TOTAL) BY MOUTH 2 TIMES DAILY. 180 tablet 3  . levETIRAcetam (KEPPRA) 1000 MG tablet Take 1 tablet (1,000 mg total) by mouth 2 (two) times daily. 180 tablet 3  . lidocaine-prilocaine (EMLA) cream APPLY TO THE AFFECTED AREA(S) AS NEEDED 30 g 3  . meloxicam (MOBIC) 15 MG tablet Take 1 tablet (15 mg total) by mouth daily. 30 tablet 3  . omeprazole (PRILOSEC) 40 MG  capsule Take 1 capsule (40 mg total) by mouth daily. 30 capsule 6  . promethazine (PHENERGAN) 25 MG tablet Take 1 tablet (25 mg total) by mouth every 8 (eight) hours as needed for nausea or vomiting. 1 tablet every 8 hours prn 30 tablet 2  . topiramate (TOPAMAX) 25 MG tablet Take 1 tablet (25 mg total) by mouth 2 (two) times daily as needed. 30 tablet 2  . zolpidem (AMBIEN) 5 MG tablet Take 1 tablet (5 mg total) by mouth at bedtime as needed. for sleep 30 tablet 0  . Deferasirox 360 MG TABS TAKE 2 TABLETS BY MOUTH ONCE A DAY (Patient not taking: Reported on 07/26/2020)     No facility-administered medications prior to visit.    Allergies  Allergen Reactions  . Zofran [Ondansetron Hcl] Hives and Itching  . Demerol [Meperidine] Other (See Comments)    Pt has seizures  . Fentanyl And Related Nausea And Vomiting and Other (See Comments)    Very sedated  **Not allergic to the injection** JUST ALLERGIC TO PATCH  . Other Itching    Greek Yogurt  . Codeine Hives and Itching  . Hydrocodone Hives and Itching  . Ibuprofen     ROS Review of Systems  Constitutional: Negative.   HENT: Negative.   Eyes: Negative.   Respiratory: Negative.   Cardiovascular: Negative.   Gastrointestinal: Positive for abdominal  distention (obese).  Endocrine: Negative.   Genitourinary: Negative.   Musculoskeletal: Positive for arthralgias (generalized joint pain).  Skin: Negative.   Allergic/Immunologic: Negative.   Neurological: Positive for dizziness (occasional ) and headaches (occasional ).  Hematological: Negative.   Psychiatric/Behavioral: Negative.       Objective:    Physical Exam Constitutional:      Appearance: Normal appearance.  HENT:     Head: Normocephalic and atraumatic.     Nose: Nose normal.     Mouth/Throat:     Mouth: Mucous membranes are moist.     Pharynx: Oropharynx is clear.  Cardiovascular:     Rate and Rhythm: Normal rate and regular rhythm.     Pulses: Normal pulses.     Heart sounds: Normal heart sounds.  Pulmonary:     Effort: Pulmonary effort is normal.     Breath sounds: Normal breath sounds.  Abdominal:     General: Bowel sounds are normal. There is distension (obese).     Palpations: Abdomen is soft.  Musculoskeletal:     Cervical back: Normal range of motion and neck supple.  Neurological:     Mental Status: She is alert.     BP 126/80 (BP Location: Right Arm, Patient Position: Sitting, Cuff Size: Normal)   Pulse 82   Temp 97.7 F (36.5 C)   Resp 17   Ht 5\' 4"  (1.626 m)   Wt 181 lb (82.1 kg)   SpO2 100%   BMI 31.07 kg/m  Wt Readings from Last 3 Encounters:  07/26/20 181 lb (82.1 kg)  05/24/20 177 lb 9.6 oz (80.6 kg)  02/22/20 178 lb 12.8 oz (81.1 kg)     Health Maintenance Due  Topic Date Due  . Hepatitis C Screening  Never done  . COVID-19 Vaccine (1) Never done  . PAP SMEAR-Modifier  05/13/2020  . INFLUENZA VACCINE  05/21/2020    There are no preventive care reminders to display for this patient.  Lab Results  Component Value Date   TSH 0.668 06/21/2016   Lab Results  Component Value Date   WBC 9.3  12/08/2018   HGB 7.7 (L) 12/08/2018   HCT 24.4 (L) 12/08/2018   MCV 88.7 12/08/2018   PLT 811 (H) 12/08/2018   Lab Results    Component Value Date   NA 137 11/30/2018   K 4.2 11/30/2018   CO2 24 11/30/2018   GLUCOSE 88 11/30/2018   BUN 10 11/30/2018   CREATININE 0.67 11/30/2018   BILITOT 2.3 (H) 11/30/2018   ALKPHOS 64 11/30/2018   AST 33 11/30/2018   ALT 25 11/30/2018   PROT 7.7 11/30/2018   ALBUMIN 4.4 11/30/2018   CALCIUM 8.9 11/30/2018   ANIONGAP 10 11/30/2018   Lab Results  Component Value Date   CHOL 128 09/07/2016   Lab Results  Component Value Date   HDL 26 (L) 09/07/2016   Lab Results  Component Value Date   LDLCALC 60 09/07/2016   Lab Results  Component Value Date   TRIG 211 (H) 09/07/2016   Lab Results  Component Value Date   CHOLHDL 4.9 09/07/2016   Lab Results  Component Value Date   HGBA1C <4.2 (L) 09/07/2016   Assessment & Plan:   1. Hb-SS disease without crisis [facility]) She is doing well today r/t her chronic pain management. She will continue to take pain medications as prescribed; will continue to avoid extreme heat and cold; will continue to eat a healthy diet and drink at least 64 ounces of water daily; continue stool softener as needed; will avoid colds and flu; will continue to get plenty of sleep and rest; will continue to avoid high stressful situations and remain infection free; will continue Folic Acid 1 mg daily to avoid sickle cell crisis. Continue to follow up with Hematologist as needed.  - OXYCONTIN 30 MG 12 hr tablet; Take 1 tablet (30 mg total) by mouth 2 (two) times daily.  Dispense: 60 tablet; Refill: 0 - Oxycodone HCl 10 MG TABS; Take 1 tablet (10 mg total) by mouth every 4 (four) hours as needed for up to 15 days (pain).  Dispense: 90 tablet; Refill: 0  2. Chronic pain syndrome - OXYCONTIN 30 MG 12 hr tablet; Take 1 tablet (30 mg total) by mouth 2 (two) times daily.  Dispense: 60 tablet; Refill: 0 - Oxycodone HCl 10 MG TABS; Take 1 tablet (10 mg total) by mouth every 4 (four) hours as needed for up to 15 days (pain).  Dispense: 90 tablet; Refill: 0  3.  Chronic, continuous use of opioids - OXYCONTIN 30 MG 12 hr tablet; Take 1 tablet (30 mg total) by mouth 2 (two) times daily.  Dispense: 60 tablet; Refill: 0 - Oxycodone HCl 10 MG TABS; Take 1 tablet (10 mg total) by mouth every 4 (four) hours as needed for up to 15 days (pain).  Dispense: 90 tablet; Refill: 0  4. Medication management - OXYCONTIN 30 MG 12 hr tablet; Take 1 tablet (30 mg total) by mouth 2 (two) times daily.  Dispense: 60 tablet; Refill: 0 - Oxycodone HCl 10 MG TABS; Take 1 tablet (10 mg total) by mouth every 4 (four) hours as needed for up to 15 days (pain).  Dispense: 90 tablet; Refill: 0  5. Seizures (HCC) Stable. No signs or symptoms of recurrence noted.   6. Weakness of lower extremity, unspecified laterality  7. Follow up She will follow up in 2 months.   Meds ordered this encounter  Medications  . OXYCONTIN 30 MG 12 hr tablet    Sig: Take 1 tablet (30 mg total) by mouth 2 (two)  times daily.    Dispense:  60 tablet    Refill:  0    Order Specific Question:   Supervising Provider    Answer:   Quentin AngstJEGEDE, OLUGBEMIGA E [id number]]  . Oxycodone HCl 10 MG TABS    Sig: Take 1 tablet (10 mg total) by mouth every 4 (four) hours as needed for up to 15 days (pain).    Dispense:  90 tablet    Refill:  0    Order Specific Question:   Supervising Provider    Answer:   Quentin AngstJEGEDE, OLUGBEMIGA E [id number]]    No orders of the defined types were placed in this encounter.   Referral Orders  No referral(s) requested today    Raliegh IpNatalie Tarsha Blando,  MSN, FNP-BC [facility] [address]  [place], [place] [id number] [phone] fax  Problem List Items Addressed This Visit      Other   Chronic pain syndrome   Relevant Medications   OXYCONTIN 30 MG 12 hr tablet   Oxycodone HCl 10 MG TABS   Chronic, continuous use of opioids   Relevant Medications   OXYCONTIN 30 MG 12 hr tablet    Oxycodone HCl 10 MG TABS   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 OXYCONTIN 30 MG 12 hr tablet    Sig: Take 1 tablet (30 mg total) by mouth 2 (two) times daily.    Dispense:  60 tablet    Refill:  0    Order Specific Question:   Supervising Provider    Answer:   Quentin AngstJEGEDE, OLUGBEMIGA E [id number]]  . Oxycodone HCl 10 MG TABS    Sig: Take 1 tablet (10 mg total) by mouth every 4 (four) hours as needed for up to 15 days (pain).    Dispense:  90 tablet    Refill:  0    Order Specific Question:   Supervising Provider    Answer:   Quentin AngstJEGEDE, OLUGBEMIGA E [[id number]]    Follow-up: Return in about 2 months (around 09/25/2020).    Kallie LocksNatalie M Maylin [place], FNP

## 2020-07-27 MED FILL — LIDOCAINE-PRILOCAINE CREAM: 2.5-2.5 | 5 days supply | Qty: 30 | Fill #1

## 2020-07-28 DIAGNOSIS — D571 Sickle-cell disease without crisis: Secondary | ICD-10-CM | POA: Diagnosis not present

## 2020-07-31 DIAGNOSIS — D571 Sickle-cell disease without crisis: Secondary | ICD-10-CM | POA: Diagnosis not present

## 2020-08-01 ENCOUNTER — Encounter: Payer: Self-pay | Admitting: Family Medicine

## 2020-08-01 ENCOUNTER — Telehealth: Payer: Self-pay | Admitting: Family Medicine

## 2020-08-02 ENCOUNTER — Encounter: Payer: Self-pay | Admitting: Family Medicine

## 2020-08-03 ENCOUNTER — Other Ambulatory Visit: Payer: Self-pay | Admitting: Family Medicine

## 2020-08-03 ENCOUNTER — Telehealth: Payer: Self-pay | Admitting: Family Medicine

## 2020-08-03 DIAGNOSIS — G4700 Insomnia, unspecified: Secondary | ICD-10-CM

## 2020-08-03 MED ORDER — ZOLPIDEM TARTRATE 5 MG PO TABS: 5.0000 mg | ORAL_TABLET | Freq: Every evening | ORAL | 0 refills | Status: DC | PRN

## 2020-08-03 NOTE — Telephone Encounter (Signed)
Sent to provider 

## 2020-08-04 ENCOUNTER — Other Ambulatory Visit: Payer: Self-pay | Admitting: Family Medicine

## 2020-08-04 DIAGNOSIS — F1190 Opioid use, unspecified, uncomplicated: Secondary | ICD-10-CM

## 2020-08-04 DIAGNOSIS — G894 Chronic pain syndrome: Secondary | ICD-10-CM

## 2020-08-04 DIAGNOSIS — D571 Sickle-cell disease without crisis: Secondary | ICD-10-CM

## 2020-08-04 MED ORDER — XTAMPZA ER 27 MG PO C12A: 27.0000 mg | EXTENDED_RELEASE_CAPSULE | Freq: Two times a day (BID) | ORAL | 0 refills | Status: DC

## 2020-08-04 NOTE — Telephone Encounter (Signed)
Done

## 2020-08-07 ENCOUNTER — Other Ambulatory Visit: Payer: Self-pay | Admitting: Family Medicine

## 2020-08-07 DIAGNOSIS — G4700 Insomnia, unspecified: Secondary | ICD-10-CM

## 2020-08-07 DIAGNOSIS — F1190 Opioid use, unspecified, uncomplicated: Secondary | ICD-10-CM

## 2020-08-07 DIAGNOSIS — G894 Chronic pain syndrome: Secondary | ICD-10-CM

## 2020-08-07 DIAGNOSIS — D571 Sickle-cell disease without crisis: Secondary | ICD-10-CM

## 2020-08-07 MED ORDER — XTAMPZA ER 27 MG PO C12A: 27.0000 mg | EXTENDED_RELEASE_CAPSULE | Freq: Two times a day (BID) | ORAL | 0 refills | Status: DC

## 2020-08-07 MED ORDER — ZOLPIDEM TARTRATE 5 MG PO TABS: 5.0000 mg | ORAL_TABLET | Freq: Every evening | ORAL | 0 refills | Status: DC | PRN

## 2020-08-07 MED FILL — XTAMPZA ER 27 MG CAPSULE: 27 | 30 days supply | Qty: 60 | Fill #0

## 2020-08-07 MED FILL — ZOLPIDEM TARTRATE 5 MG TABS: 5 | 14 days supply | Qty: 14 | Fill #0

## 2020-08-22 MED FILL — LIDOCAINE-PRILOCAINE CREAM: 2.5-2.5 | 5 days supply | Qty: 30 | Fill #2

## 2020-08-28 ENCOUNTER — Other Ambulatory Visit: Payer: Self-pay | Admitting: Family Medicine

## 2020-08-28 ENCOUNTER — Telehealth: Payer: Self-pay | Admitting: Family Medicine

## 2020-08-28 DIAGNOSIS — Z79899 Other long term (current) drug therapy: Secondary | ICD-10-CM

## 2020-08-28 DIAGNOSIS — G894 Chronic pain syndrome: Secondary | ICD-10-CM

## 2020-08-28 DIAGNOSIS — F1190 Opioid use, unspecified, uncomplicated: Secondary | ICD-10-CM

## 2020-08-28 DIAGNOSIS — D571 Sickle-cell disease without crisis: Secondary | ICD-10-CM

## 2020-08-28 MED ORDER — OXYCODONE HCL 10 MG PO TABS: 10.0000 mg | ORAL_TABLET | ORAL | 0 refills | Status: DC | PRN

## 2020-08-29 DIAGNOSIS — D571 Sickle-cell disease without crisis: Secondary | ICD-10-CM | POA: Diagnosis not present

## 2020-08-29 MED FILL — oxyCODONE HCL 10 MG TABS: 10 | 15 days supply | Qty: 90 | Fill #0

## 2020-08-29 NOTE — Telephone Encounter (Signed)
Sent to provider 

## 2020-08-30 MED FILL — ZOLPIDEM TARTRATE 5 MG TABS: 5 | 14 days supply | Qty: 14 | Fill #1

## 2020-08-31 DIAGNOSIS — D571 Sickle-cell disease without crisis: Secondary | ICD-10-CM | POA: Diagnosis not present

## 2020-09-04 ENCOUNTER — Telehealth: Payer: Self-pay

## 2020-09-06 ENCOUNTER — Other Ambulatory Visit: Payer: Self-pay | Admitting: Family Medicine

## 2020-09-06 DIAGNOSIS — G4700 Insomnia, unspecified: Secondary | ICD-10-CM

## 2020-09-07 ENCOUNTER — Encounter: Payer: Self-pay | Admitting: Family Medicine

## 2020-09-11 ENCOUNTER — Telehealth: Payer: Self-pay | Admitting: Family Medicine

## 2020-09-11 ENCOUNTER — Ambulatory Visit: Payer: Medicaid Other | Admitting: Neurology

## 2020-09-11 ENCOUNTER — Other Ambulatory Visit: Payer: Self-pay | Admitting: Family Medicine

## 2020-09-11 DIAGNOSIS — R569 Unspecified convulsions: Secondary | ICD-10-CM

## 2020-09-11 MED ORDER — LEVETIRACETAM 1000 MG PO TABS: 1000.0000 mg | ORAL_TABLET | Freq: Two times a day (BID) | ORAL | 3 refills | Status: DC

## 2020-09-11 MED ORDER — LAMOTRIGINE 200 MG PO TABS
ORAL_TABLET | ORAL | 3 refills | Status: DC
Start: 2020-09-11 — End: 2020-12-08

## 2020-09-11 MED FILL — SUBVENITE 200 MG TABS: 200 | 90 days supply | Qty: 180 | Fill #0

## 2020-09-11 MED FILL — levETIRAcetam 1000 MG TABS: 1000 | 90 days supply | Qty: 180 | Fill #0

## 2020-09-11 NOTE — Telephone Encounter (Signed)
Attempted to contact patient today to inform her that her insurance now only Ambien for #15 tablets every 30 days. Mailbox is full, so unable to leave voice message.

## 2020-09-12 NOTE — Telephone Encounter (Signed)
Done

## 2020-09-18 ENCOUNTER — Telehealth: Payer: Self-pay | Admitting: Family Medicine

## 2020-09-18 ENCOUNTER — Other Ambulatory Visit: Payer: Self-pay | Admitting: Family Medicine

## 2020-09-18 DIAGNOSIS — G4700 Insomnia, unspecified: Secondary | ICD-10-CM

## 2020-09-18 MED ORDER — ZOLPIDEM TARTRATE 5 MG PO TABS: 5.0000 mg | ORAL_TABLET | Freq: Every evening | ORAL | 0 refills | Status: DC | PRN

## 2020-09-19 ENCOUNTER — Telehealth: Payer: Self-pay | Admitting: Family Medicine

## 2020-09-19 ENCOUNTER — Other Ambulatory Visit: Payer: Self-pay | Admitting: Family Medicine

## 2020-09-19 DIAGNOSIS — D571 Sickle-cell disease without crisis: Secondary | ICD-10-CM

## 2020-09-19 DIAGNOSIS — F1190 Opioid use, unspecified, uncomplicated: Secondary | ICD-10-CM

## 2020-09-19 DIAGNOSIS — Z79899 Other long term (current) drug therapy: Secondary | ICD-10-CM

## 2020-09-19 DIAGNOSIS — G894 Chronic pain syndrome: Secondary | ICD-10-CM

## 2020-09-19 MED ORDER — XTAMPZA ER 27 MG PO C12A: 27.0000 mg | EXTENDED_RELEASE_CAPSULE | Freq: Two times a day (BID) | ORAL | 0 refills | Status: DC

## 2020-09-19 MED ORDER — OXYCODONE HCL 10 MG PO TABS: 10.0000 mg | ORAL_TABLET | ORAL | 0 refills | Status: DC | PRN

## 2020-09-19 MED FILL — XTAMPZA ER 27 MG CAPSULE: 27 | 30 days supply | Qty: 60 | Fill #0

## 2020-09-19 MED FILL — oxyCODONE HCL 10 MG TABS: 10 | 15 days supply | Qty: 90 | Fill #0

## 2020-09-20 NOTE — Telephone Encounter (Signed)
done

## 2020-09-20 NOTE — Telephone Encounter (Signed)
Done

## 2020-09-25 ENCOUNTER — Ambulatory Visit: Payer: Medicaid Other | Admitting: Family Medicine

## 2020-09-26 DIAGNOSIS — D571 Sickle-cell disease without crisis: Secondary | ICD-10-CM | POA: Diagnosis not present

## 2020-09-28 DIAGNOSIS — D571 Sickle-cell disease without crisis: Secondary | ICD-10-CM | POA: Diagnosis not present

## 2020-09-30 MED FILL — ZOLPIDEM TARTRATE 5 MG TABS: 5 | 14 days supply | Qty: 14 | Fill #0

## 2020-10-03 ENCOUNTER — Other Ambulatory Visit: Payer: Self-pay | Admitting: Family Medicine

## 2020-10-03 DIAGNOSIS — G4700 Insomnia, unspecified: Secondary | ICD-10-CM

## 2020-10-03 MED ORDER — TRAZODONE HCL 50 MG PO TABS: 25.0000 mg | ORAL_TABLET | Freq: Every evening | ORAL | 6 refills | Status: DC | PRN

## 2020-10-03 MED FILL — traZODone HCL 50 MG TABS: 50 | 30 days supply | Qty: 30 | Fill #0

## 2020-10-03 NOTE — Telephone Encounter (Signed)
Is this okay to refill? 

## 2020-10-06 ENCOUNTER — Other Ambulatory Visit: Payer: Self-pay

## 2020-10-06 ENCOUNTER — Ambulatory Visit (INDEPENDENT_AMBULATORY_CARE_PROVIDER_SITE_OTHER): Payer: Medicaid Other | Admitting: Family Medicine

## 2020-10-06 ENCOUNTER — Encounter: Payer: Self-pay | Admitting: Family Medicine

## 2020-10-06 ENCOUNTER — Other Ambulatory Visit: Payer: Self-pay | Admitting: Family Medicine

## 2020-10-06 VITALS — BP 119/58 | HR 82 | Temp 98.2°F | Ht 64.0 in | Wt 180.4 lb

## 2020-10-06 DIAGNOSIS — Z79899 Other long term (current) drug therapy: Secondary | ICD-10-CM

## 2020-10-06 DIAGNOSIS — D571 Sickle-cell disease without crisis: Secondary | ICD-10-CM

## 2020-10-06 DIAGNOSIS — F1190 Opioid use, unspecified, uncomplicated: Secondary | ICD-10-CM | POA: Diagnosis not present

## 2020-10-06 DIAGNOSIS — R29898 Other symptoms and signs involving the musculoskeletal system: Secondary | ICD-10-CM

## 2020-10-06 DIAGNOSIS — Z09 Encounter for follow-up examination after completed treatment for conditions other than malignant neoplasm: Secondary | ICD-10-CM

## 2020-10-06 DIAGNOSIS — G894 Chronic pain syndrome: Secondary | ICD-10-CM | POA: Diagnosis not present

## 2020-10-06 DIAGNOSIS — R569 Unspecified convulsions: Secondary | ICD-10-CM

## 2020-10-06 DIAGNOSIS — Z8673 Personal history of transient ischemic attack (TIA), and cerebral infarction without residual deficits: Secondary | ICD-10-CM

## 2020-10-06 DIAGNOSIS — G4700 Insomnia, unspecified: Secondary | ICD-10-CM

## 2020-10-06 MED ORDER — ZOLPIDEM TARTRATE 5 MG PO TABS: 5.0000 mg | ORAL_TABLET | Freq: Every evening | ORAL | 0 refills | Status: AC | PRN

## 2020-10-06 MED ORDER — OXYCODONE HCL 10 MG PO TABS: 10.0000 mg | ORAL_TABLET | ORAL | 0 refills | Status: DC | PRN

## 2020-10-06 MED FILL — oxyCODONE HCL 10 MG TABS: 10 | 15 days supply | Qty: 90 | Fill #0

## 2020-10-06 NOTE — Progress Notes (Signed)
[facility]   Established Patient Office Visit  Subjective:  Patient ID: Debra Williamson, female    DOB: Apr 23, 1981  Age: 39 y.o. MRN: [id number]  CC:  Chief Complaint  Patient presents with  . Follow-up    HPI Debra Williamson is a 39 year old female who presents for Follow Up today.   Patient Active Problem List   Diagnosis Date Noted  . Weakness of lower extremity 02/27/2020  . Insomnia 08/24/2019  . Increased storage iron 08/24/2019  . Nausea 07/02/2019  . Chronic pain syndrome   . Sickle cell crisis (HCC) 11/26/2018  . Chest pain 11/25/2018  . Vaso-occlusive sickle cell crisis (HCC) 11/25/2018  . Sickle cell pain crisis (HCC) 11/24/2018  . Chronic, continuous use of opioids 10/27/2017  . 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 TIA (transient ischemic attack)   . Vertigo 09/07/2016  . Dyspnea 09/07/2016  . Lightheadedness 09/06/2016  . Healthcare maintenance 07/30/2016  . Dizziness, nonspecific 07/30/2016  . Left foot drop 07/30/2016  . Left-sided weakness 07/30/2016  . 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 History of stroke associated with blood clotting tendency 01/02/2016  . [place] disease (HCC) 10/27/2015  . Hb-SS disease with acute chest syndrome (HCC) 08/14/2015  . Acute chest syndrome (HCC)   . Acute respiratory failure with hypoxemia (HCC)   . Community acquired pneumonia 08/08/2015  . Hemiparesis following cerebrovascular accident (CVA) (HCC) 05/30/2015  . Ankle gives out 05/30/2015  . Seizure disorder (HCC) 05/30/2015  . 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 H/O: stroke 03/12/2015  . Seizures (HCC) 03/12/2015  . Sickle cell disease (HCC) 03/11/2015  . Symptomatic anemia 03/11/2015   Current Status: Since her last office visit, she is doing well  with no complaints. She states that she has pain in her arms and legs. She rates her pain today at 5/10. She has not had a hospital visit for Sickle Cell Crisis since 11/25/2018 where she was treated and discharged on 12/01/2018. She is currently taking all medications as prescribed and staying well hydrated. She reports occasional nausea, constipation, dizziness and headaches. She denies fevers, chills, fatigue, recent infections, weight loss, and night sweats. She has not had any visual changes, and falls. No chest pain, heart palpitations, cough and shortness of breath reported. Denies GI problems such as vomiting, diarrhea. She has no reports of blood in stools, dysuria and hematuria. No depression or anxiety. She is taking all medications as prescribed.  Past Medical History:  Diagnosis Date  . Anemia   . Blood transfusion without reported diagnosis   . Migraine 05/2019  . Seizures (HCC)    one time incident, may have been r/t to a pain medication she was prescribed  . Sickle cell disease (HCC)   . Stroke [facility])     Past Surgical History:  Procedure Laterality Date  . CESAREAN SECTION     x4  . CHOLECYSTECTOMY    . PORTACATH PLACEMENT Right w-3  . reverse tubal ligation    . TEE WITHOUT CARDIOVERSION N/A 09/10/2016   Procedure: TRANSESOPHAGEAL ECHOCARDIOGRAM (TEE);  Surgeon: Thurmon Fair, MD;  Location: [facility] ENDOSCOPY;  Service: Cardiovascular;  Laterality: N/A;    Family History  Problem Relation Age of Onset  . HIV/AIDS Father     Social History   Socioeconomic History  . Marital status: Married    Spouse  name: Not on file  . Number of children: 4  . Years of education: Not on file  . Highest education level: Not on file  Occupational History  . Occupation: Doesn't work  Tobacco Use  . Smoking status: Never Smoker  . Smokeless tobacco: Never Used  Vaping Use  . Vaping Use: Never used  Substance and Sexual Activity  . Alcohol use: No    Alcohol/week: 0.0 standard  drinks    Comment: occasionally  . Drug use: No  . Sexual activity: Yes  Other Topics Concern  . Not on file  Social History Narrative  . Not on file   Social Determinants of Health   Financial Resource Strain: Not on file  Food Insecurity: Not on file  Transportation Needs: Not on file  Physical Activity: Not on file  Stress: Not on file  Social Connections: Not on file  Intimate Partner Violence: Not on file    Outpatient Medications Prior to Visit  Medication Sig Dispense Refill  . albuterol (VENTOLIN HFA) 108 (90 Base) MCG/ACT inhaler INHALE 2 PUFFS BY MOUTH INTO THE LUNGS EVERY 4 HOURS AS NEEDED FOR WHEEZING OR SHORTNESS OF BREATH AND COUGH 8.5 g 11  . ascorbic acid (VITAMIN C) 1000 MG tablet Take by mouth.    Marland Kitchen aspirin EC 81 MG tablet Take 1 tablet by mouth every evening.     . calcium carbonate (OS-CAL) 600 MG TABS tablet Take one tablet once daily after finishing the Drisdol.    . cetirizine (ZYRTEC) 10 MG tablet Take 1 tablet (10 mg total) by mouth daily. 30 tablet 11  . Deferasirox 360 MG TABS TAKE 2 TABLETS BY MOUTH ONCE A DAY (Patient not taking: Reported on 07/26/2020)    . diclofenac sodium (VOLTAREN) 1 % GEL Apply 2 g topically 4 (four) times daily. 100 g 1  . folic acid (FOLVITE) 1 MG tablet Take 1 mg by mouth daily.     . hydrOXYzine (ATARAX/VISTARIL) 25 MG tablet Take 0.5-1 tablets (12.5-25 mg total) by mouth every 8 (eight) hours as needed for itching. 60 tablet 6  . lamoTRIgine (LAMICTAL) 200 MG tablet TAKE 1 TABLET (200 MG TOTAL) BY MOUTH 2 TIMES DAILY. 180 tablet 3  . levETIRAcetam (KEPPRA) 1000 MG tablet Take 1 tablet (1,000 mg total) by mouth 2 (two) times daily. 180 tablet 3  . lidocaine-prilocaine (EMLA) cream APPLY TO THE AFFECTED AREA(S) AS NEEDED 30 g 3  . meloxicam (MOBIC) 15 MG tablet Take 1 tablet (15 mg total) by mouth daily. 30 tablet 3  . omeprazole (PRILOSEC) 40 MG capsule Take 1 capsule (40 mg total) by mouth daily. 30 capsule 6  . oxyCODONE ER  (XTAMPZA ER) 27 MG C12A Take 27 mg by mouth every 12 (twelve) hours. 60 capsule 0  . promethazine (PHENERGAN) 25 MG tablet Take 1 tablet (25 mg total) by mouth every 8 (eight) hours as needed for nausea or vomiting. 1 tablet every 8 hours prn 30 tablet 2  . traZODone (DESYREL) 50 MG tablet Take 0.5-1 tablets (25-50 mg total) by mouth at bedtime as needed for sleep. 30 tablet 6  . zolpidem (AMBIEN) 5 MG tablet Take 1 tablet (5 mg total) by mouth at bedtime as needed. for sleep 30 tablet 0   No facility-administered medications prior to visit.    Allergies  Allergen Reactions  . Zofran [Ondansetron Hcl] Hives and Itching  . Demerol [Meperidine] Other (See Comments)    Pt has seizures  . Fentanyl And  Related Nausea And Vomiting and Other (See Comments)    Very sedated  **Not allergic to the injection** JUST ALLERGIC TO PATCH  . Other Itching    Greek Yogurt  . Codeine Hives and Itching  . Hydrocodone Hives and Itching  . Ibuprofen     ROS Review of Systems  Constitutional: Negative.   HENT: Negative.   Eyes: Negative.   Respiratory: Negative.   Cardiovascular: Negative.   Gastrointestinal: Positive for constipation (occasional ) and nausea (occasional ).  Endocrine: Negative.   Genitourinary: Negative.   Musculoskeletal: Positive for arthralgias (generalized joint pain).  Skin: Negative.   Allergic/Immunologic: Negative.   Neurological: Positive for dizziness (occasional ) and headaches (occasional ).  Hematological: Negative.   Psychiatric/Behavioral: Negative.       Objective:    Physical Exam Vitals and nursing note reviewed.  Constitutional:      Appearance: Normal appearance.  HENT:     Head: Normocephalic and atraumatic.     Nose: Nose normal.     Mouth/Throat:     Mouth: Mucous membranes are moist.     Pharynx: Oropharynx is clear.  Cardiovascular:     Rate and Rhythm: Normal rate and regular rhythm.     Pulses: Normal pulses.     Heart sounds: Normal  heart sounds.  Pulmonary:     Effort: Pulmonary effort is normal.     Breath sounds: Normal breath sounds.  Abdominal:     General: Bowel sounds are normal.     Palpations: Abdomen is soft.  Musculoskeletal:        General: Normal range of motion.     Cervical back: Normal range of motion and neck supple.  Skin:    General: Skin is warm and dry.  Neurological:     General: No focal deficit present.     Mental Status: She is alert and oriented to person, place, and time.     Motor: Weakness (extremity) present.  Psychiatric:        Mood and Affect: Mood normal.        Behavior: Behavior normal.        Thought Content: Thought content normal.        Judgment: Judgment normal.    BP (!) 119/58   Pulse 82   Temp 98.2 F (36.8 C) (Temporal)   Ht  (1.626 m)   Wt 180 lb 6.4 oz (81.8 kg)   SpO2 95%   BMI 30.97 kg/m  Wt Readings from Last 3 Encounters:  10/06/20 180 lb 6.4 oz (81.8 kg)  07/26/20 181 lb (82.1 kg)  05/24/20 177 lb 9.6 oz (80.6 kg)     Health Maintenance Due  Topic Date Due  . Hepatitis C Screening  Never done  . COVID-19 Vaccine (1) Never done  . PAP SMEAR-Modifier  05/13/2020  . INFLUENZA VACCINE  05/21/2020    There are no preventive care reminders to display for this patient.  Lab Results  Component Value Date   TSH 0.668 06/21/2016   Lab Results  Component Value Date   WBC 9.3 12/08/2018   HGB 7.7 (L) 12/08/2018   HCT 24.4 (L) 12/08/2018   MCV 88.7 12/08/2018   PLT 811 (H) 12/08/2018   Lab Results  Component Value Date   NA 137 11/30/2018   K 4.2 11/30/2018   CO2 24 11/30/2018   GLUCOSE 88 11/30/2018   BUN 10 11/30/2018   CREATININE 0.67 11/30/2018   BILITOT 2.3 (H) 11/30/2018  ALKPHOS 64 11/30/2018   AST 33 11/30/2018   ALT 25 11/30/2018   PROT 7.7 11/30/2018   ALBUMIN 4.4 11/30/2018   CALCIUM 8.9 11/30/2018   ANIONGAP 10 11/30/2018   Lab Results  Component Value Date   CHOL 128 09/07/2016   Lab Results  Component  Value Date   HDL 26 (L) 09/07/2016   Lab Results  Component Value Date   LDLCALC 60 09/07/2016   Lab Results  Component Value Date   TRIG 211 (H) 09/07/2016   Lab Results  Component Value Date   CHOLHDL 4.9 09/07/2016   Lab Results  Component Value Date   HGBA1C <4.2 (L) 09/07/2016      Assessment & Plan:   1. Hb-SS disease without crisis [facility]) She is doing well today r/t her chronic pain management. She will continue to take pain medications as prescribed; will continue to avoid extreme heat and cold; will continue to eat a healthy diet and drink at least 64 ounces of water daily; continue stool softener as needed; will avoid colds and flu; will continue to get plenty of sleep and rest; will continue to avoid high stressful situations and remain infection free; will continue Folic Acid 1 mg daily to avoid sickle cell crisis. Continue to follow up with Hematologist as needed.  - Oxycodone HCl 10 MG TABS; Take 1 tablet (10 mg total) by mouth every 4 (four) hours as needed for up to 15 days (pain).  Dispense: 90 tablet; Refill: 0  2. Chronic pain syndrome - Oxycodone HCl 10 MG TABS; Take 1 tablet (10 mg total) by mouth every 4 (four) hours as needed for up to 15 days (pain).  Dispense: 90 tablet; Refill: 0  3. Chronic, continuous use of opioids - Oxycodone HCl 10 MG TABS; Take 1 tablet (10 mg total) by mouth every 4 (four) hours as needed for up to 15 days (pain).  Dispense: 90 tablet; Refill: 0  4. Medication management - Oxycodone HCl 10 MG TABS; Take 1 tablet (10 mg total) by mouth every 4 (four) hours as needed for up to 15 days (pain).  Dispense: 90 tablet; Refill: 0  5. Weakness of lower extremity, unspecified laterality Left leg brace.   6. Seizures (HCC) Stable. No reports of recurrence noted or reported.   7. Insomnia, unspecified type Insurance approved for 15-day supply every 30 days.  - zolpidem (AMBIEN) 5 MG tablet; Take 1 tablet (5 mg total) by mouth at bedtime  as needed. for sleep  Dispense: 30 tablet; Refill: 0  8. History of stroke Stable. No signs or symptoms of recurrence noted or reported today. Left leg brace.   9. Follow up She will follow up in 2 months.   Meds ordered this encounter  Medications  . zolpidem (AMBIEN) 5 MG tablet    Sig: Take 1 tablet (5 mg total) by mouth at bedtime as needed. for sleep    Dispense:  30 tablet    Refill:  0    Order Specific Question:   Supervising Provider    Answer:   Quentin Angst [id number]  . Oxycodone HCl 10 MG TABS    Sig: Take 1 tablet (10 mg total) by mouth every 4 (four) hours as needed for up to 15 days (pain).    Dispense:  90 tablet    Refill:  0    Order Specific Question:   Supervising Provider    Answer:   Quentin Angst [id number]    No  orders of the defined types were placed in this encounter.   Referral Orders  No referral(s) requested today      Problem List Items Addressed This Visit      Other   Chronic pain syndrome   Relevant Medications   Oxycodone HCl 10 MG TABS   Chronic, continuous use of opioids   Relevant Medications   Oxycodone HCl 10 MG TABS   Hb-SS disease without crisis (HCC) - Primary   Relevant Medications   Oxycodone HCl 10 MG TABS   Insomnia   Relevant Medications   zolpidem (AMBIEN) 5 MG tablet   Seizures (HCC) (Chronic)   Relevant Medications   zolpidem (AMBIEN) 5 MG tablet   Weakness of lower extremity    Other Visit Diagnoses    Medication management       Relevant Medications   Oxycodone HCl 10 MG TABS   History of stroke       Follow up          Meds ordered this encounter  Medications  . zolpidem (AMBIEN) 5 MG tablet    Sig: Take 1 tablet (5 mg total) by mouth at bedtime as needed. for sleep    Dispense:  30 tablet    Refill:  0    Order Specific Question:   Supervising Provider    Answer:   Quentin AngstJEGEDE, OLUGBEMIGA E [id number]  . Oxycodone HCl 10 MG TABS    Sig: Take 1 tablet (10 mg total) by mouth every 4  (four) hours as needed for up to 15 days (pain).    Dispense:  90 tablet    Refill:  0    Order Specific Question:   Supervising Provider    Answer:   Quentin AngstJEGEDE, OLUGBEMIGA E [[id number]]    Follow-up: Return in about 2 months (around 12/07/2020).    Kallie LocksNatalie M Lynasia Meloche, FNP

## 2020-10-08 ENCOUNTER — Encounter: Payer: Self-pay | Admitting: Family Medicine

## 2020-10-17 ENCOUNTER — Other Ambulatory Visit: Payer: Self-pay | Admitting: Family Medicine

## 2020-10-17 ENCOUNTER — Telehealth: Payer: Self-pay | Admitting: Family Medicine

## 2020-10-17 DIAGNOSIS — Z79899 Other long term (current) drug therapy: Secondary | ICD-10-CM

## 2020-10-17 DIAGNOSIS — F1190 Opioid use, unspecified, uncomplicated: Secondary | ICD-10-CM

## 2020-10-17 DIAGNOSIS — D571 Sickle-cell disease without crisis: Secondary | ICD-10-CM

## 2020-10-17 DIAGNOSIS — G894 Chronic pain syndrome: Secondary | ICD-10-CM

## 2020-10-17 MED ORDER — XTAMPZA ER 27 MG PO C12A: 27.0000 mg | EXTENDED_RELEASE_CAPSULE | Freq: Two times a day (BID) | ORAL | 0 refills | Status: DC

## 2020-10-17 MED ORDER — OXYCODONE HCL 10 MG PO TABS: 10.0000 mg | ORAL_TABLET | ORAL | 0 refills | Status: DC | PRN

## 2020-10-17 MED FILL — oxyCODONE HCL 10 MG TABS: 10 | 15 days supply | Qty: 90 | Fill #0

## 2020-10-17 MED FILL — XTAMPZA ER 27 MG CAPSULE: 27 | 30 days supply | Qty: 60 | Fill #0

## 2020-10-17 NOTE — Telephone Encounter (Signed)
done

## 2020-10-18 ENCOUNTER — Other Ambulatory Visit: Payer: Self-pay | Admitting: *Deleted

## 2020-10-18 ENCOUNTER — Other Ambulatory Visit: Payer: Self-pay

## 2020-10-18 DIAGNOSIS — Z139 Encounter for screening, unspecified: Secondary | ICD-10-CM

## 2020-10-18 MED FILL — PROAIR HFA 90 MCG INHALER: 108 (90 BAS | 17 days supply | Qty: 9 | Fill #1

## 2020-10-18 NOTE — Patient Instructions (Signed)
Visit Information  Ms. Wideman was given information about Medicaid Managed Care team care coordination services as a part of their [employer] Medicaid benefit. [name] verbally consented to engagement with the [facility] Managed Care team.   For questions related to your Healthy [facility] health plan, please call: [phone] or visit the homepage here: [email or web address]  If you would like to schedule transportation through your Healthy [facility] plan, please call the following number at least 2 days in advance of your appointment: [phone]   Patient Care Plan: Sickle Cell Disease (Adult)    Problem Identified: Managing Sickle Cell Anemia     Long-Range Goal: Managing Sickle Cell Anemia   Start Date: 10/18/2020  Expected End Date: 12/18/2020  This Visit's Progress: On track  Priority: Medium  Note:   Current Barriers:  . Chronic Disease Management support and education needs related to managing sickle cell anemia and weight loss -Ms Chilcott is currently managing symptoms of sickle cell anemia with medications, routine office visits, hydration, and a healthy diet. She would like to lose weight to assist in the benefits of her symptom management plan. Her last hospitalization was in 2020. Marland Kitchen Housing needs- Ms Dimaano has housing, but would like assistance with finding new housing  Nurse Case Manager Clinical Goal(s):  Marland Kitchen Over the next 60 days, patient will meet with RN Care Manager to address progress with weight loss . Over the next 60 days, patient will attend all scheduled medical appointments . Over the next 30 days, patient will work with CM team pharmacist to review medications . Over the next 30 days, patient will work with [place] for assistance with Housing   Interventions:  . Inter-disciplinary care team collaboration (see longitudinal plan of care) . Reviewed medications with patient and discussed taking Oxycodone ER  twice a day for pain management and taking Oxycodone 10 mg as needed for increased pain . Discussed plans with patient for ongoing care management follow up and provided patient with direct contact information for care management team . Provided patient with mychart educational materials related to healthy diet . Care Guide referral for assistance with housing . Pharmacy referral for medication review  Patient Goals/Self-Care Activities Over the next 60 days, patient will:  -Self administers medications as prescribed - Attends all scheduled provider appointments - Calls pharmacy for medication refills - Performs ADL's independently - Calls provider office for new concerns or questions - develop personal pain management plan - plan exercise or activity when pain is best controlled - track times pain is worst and when it is best - track what makes the pain worse and what makes it better - use ice or heat for pain relief - use relaxation during pain  Follow Up Plan: Telephone follow up appointment with Managed Medicaid care management team member scheduled for:12/18/20 @ 10:30am        Please see education materials related to Healthy diet and increasing physical activity provided by MyChart link.    The Journal of Orthopaedic and Sports Physical Therapy, 44(10), 748. [email or web address]">  How to Increase Your Level of Physical Activity Getting regular physical activity is important for your overall health and well-being. Most people do not get enough exercise. There are easy ways to increase your level of physical activity, even if you have not been very active in the past or if you are just starting out. How can increasing my physical activity affect me? Physical activity has many short-term and long-term benefits. Being active on  a regular basis can improve your physical and mental health as well as provide other benefits. Physical health benefits  Helping you  lose weight or maintain a healthy weight.  Strengthening your muscles and bones.  Reducing your risk of certain long-term (chronic) diseases, including heart disease, cancer, and diabetes.  Being able to move around more easily and for longer periods of time without getting tired (increased stamina).  Improving your ability to fight off illness (enhanced immunity).  Being able to sleep better.  Helping you stay healthy as you get older, including: ? Helping you stay mobile, or capable of walking and moving around. ? Preventing accidents, such as falls. ? Increasing life expectancy. Mental health benefits  Boosting your mood and improving your self-esteem.  Lowering your chance of having mental health problems, such as depression or anxiety.  Helping you feel good about your body. Other benefits  Finding new sources of fun and enjoyment.  Meeting new people who share a common interest. What steps can I take to be more physically active? Getting started  If you have a chronic illness or have not been active for a while, check with your health care provider about how to get started. Ask your health care provider what activities are safe for you.  Start out slowly. Walking or doing some simple chair exercises is a good place to start, especially if you have not been active before or for a long time.  Set goals that you can work toward. Ask your health care provider how much exercise is best for you. In general, most adults should: ? Do moderate-intensity exercise for at least 150 minutes each week (30 minutes on most days of the week) or vigorous exercise for at least 75 minutes each week, or a combination of these.  Moderate-intensity exercise can include walking at a quick pace, biking, yoga, water aerobics, or gardening.  Vigorous exercise involves activities that take more effort, such as jogging or running, playing sports, swimming laps, or jumping rope. ? Do strength  exercises on at least 2 days each week. This can include weight lifting, body weight exercises, and resistance-band exercises.  Consider using a fitness tracker, such as a mobile phone app or a device worn like a watch, that will count the number of steps you take each day. Many people strive to reach 10,000 steps a day. Choosing activities  Try to find activities that you enjoy. You are more likely to commit to an exercise routine if it does not feel like a chore.  If you have bone or joint problems, choose low-impact exercises, like walking or swimming.  Use these tips for being successful with an exercise plan: ? Find a workout partner for accountability. ? Join a group or class, such as an aerobics class, cycling class, or sports team. ? Make family time active. Go for a walk, bike, or swim. ? Include a variety of exercises each week. Being active in your daily routines Besides your formal exercise plans, you can find ways to do physical activity during your daily routines, such as:  Walking or biking to work or to the store.  Taking the stairs instead of the elevator.  Parking farther away from the door at work or at the store.  Planning walking meetings.  Walking around while you are on the phone.  Where to find more information  Centers for Disease Control and Prevention: WorkDashboard.es  President's Council on Fitness, Sports & Nutrition: www.fitness.gov  ChooseMyPlate: FormerBoss.no Contact  a health care provider if:  You have headaches, muscle aches, or joint pain.  You feel dizzy or light-headed while exercising.  You faint.  You have chest pain while exercising. Summary  Exercise benefits your mind and body at any age, even if you are just starting out.  If you have a chronic illness or have not been active for a while, check with your health care provider before increasing your physical activity.  Choose activities that are safe and  enjoyable for you. Ask your health care provider what activities are safe for you.  Start slowly. Tell your health care provider if you have problems as you start to increase your activity level. This information is not intended to replace advice given to you by your health care provider. Make sure you discuss any questions you have with your health care provider. Document Revised: 07/12/2019 Document Reviewed: 05/03/2019 Elsevier Patient Education  [place] Following a healthy eating pattern may help you to achieve and maintain a healthy body weight, reduce the risk of chronic disease, and live a long and productive life. It is important to follow a healthy eating pattern at an appropriate calorie level for your body. Your nutritional needs should be met primarily through food by choosing a variety of nutrient-rich foods. What are tips for following this plan? Reading food labels  Read labels and choose the following: ? Reduced or low sodium. ? Juices with 100% fruit juice. ? Foods with low saturated fats and high polyunsaturated and monounsaturated fats. ? Foods with whole grains, such as whole wheat, cracked wheat, brown rice, and wild rice. ? Whole grains that are fortified with folic acid. This is recommended for women who are pregnant or who want to become pregnant.  Read labels and avoid the following: ? Foods with a lot of added sugars. These include foods that contain brown sugar, corn sweetener, corn syrup, dextrose, fructose, glucose, high-fructose corn syrup, honey, invert sugar, lactose, malt syrup, maltose, molasses, raw sugar, sucrose, trehalose, or turbinado sugar.  Do not eat more than the following amounts of added sugar per day:  6 teaspoons (25 g) for women.  9 teaspoons (38 g) for men. ? Foods that contain processed or refined starches and grains. ? Refined grain products, such as white flour, degermed cornmeal, white bread, and white  rice. Shopping  Choose nutrient-rich snacks, such as vegetables, whole fruits, and nuts. Avoid high-calorie and high-sugar snacks, such as potato chips, fruit snacks, and candy.  Use oil-based dressings and spreads on foods instead of solid fats such as butter, stick margarine, or cream cheese.  Limit pre-made sauces, mixes, and "instant" products such as flavored rice, instant noodles, and ready-made pasta.  Try more plant-protein sources, such as tofu, tempeh, black beans, edamame, lentils, nuts, and seeds.  Explore eating plans such as the Mediterranean diet or vegetarian diet. Cooking  Use oil to saut or stir-fry foods instead of solid fats such as butter, stick margarine, or lard.  Try baking, boiling, grilling, or broiling instead of frying.  Remove the fatty part of meats before cooking.  Steam vegetables in water or broth. Meal planning   At meals, imagine dividing your plate into fourths: ? One-half of your plate is fruits and vegetables. ? One-fourth of your plate is whole grains. ? One-fourth of your plate is protein, especially lean meats, poultry, eggs, tofu, beans, or nuts.  Include low-fat dairy as part of your daily diet. Lifestyle  Choose healthy options in all settings, including home, work, school, restaurants, or stores.  Prepare your food safely: ? Wash your hands after handling raw meats. ? Keep food preparation surfaces clean by regularly washing with hot, soapy water. ? Keep raw meats separate from ready-to-eat foods, such as fruits and vegetables. ? Cook seafood, meat, poultry, and eggs to the recommended internal temperature. ? Store foods at safe temperatures. In general:  Keep cold foods at 60F (4.4C) or below.  Keep hot foods at 160F (60C) or above.  Keep your freezer at [facility] (-17.8C) or below.  Foods are no longer safe to eat when they have been between the temperatures of 40-160F (4.4-60C) for more than 2 hours. What foods should  I eat? Fruits Aim to eat 2 cup-equivalents of fresh, canned (in natural juice), or frozen fruits each day. Examples of 1 cup-equivalent of fruit include 1 small apple, 8 large strawberries, 1 cup canned fruit,  cup dried fruit, or 1 cup 100% juice. Vegetables Aim to eat 2-3 cup-equivalents of fresh and frozen vegetables each day, including different varieties and colors. Examples of 1 cup-equivalent of vegetables include 2 medium carrots, 2 cups raw, leafy greens, 1 cup chopped vegetable (raw or cooked), or 1 medium baked potato. Grains Aim to eat 6 ounce-equivalents of whole grains each day. Examples of 1 ounce-equivalent of grains include 1 slice of bread, 1 cup ready-to-eat cereal, 3 cups popcorn, or  cup cooked rice, pasta, or cereal. Meats and other proteins Aim to eat 5-6 ounce-equivalents of protein each day. Examples of 1 ounce-equivalent of protein include 1 egg, 1/2 cup nuts or seeds, or 1 tablespoon (16 g) peanut butter. A cut of meat or fish that is the size of a deck of cards is about 3-4 ounce-equivalents.  Of the protein you eat each week, try to have at least 8 ounces come from seafood. This includes salmon, trout, herring, and anchovies. Dairy Aim to eat 3 cup-equivalents of fat-free or low-fat dairy each day. Examples of 1 cup-equivalent of dairy include 1 cup (240 mL) milk, 8 ounces (250 g) yogurt, 1 ounces (44 g) natural cheese, or 1 cup (240 mL) fortified soy milk. Fats and oils  Aim for about 5 teaspoons (21 g) per day. Choose monounsaturated fats, such as canola and olive oils, avocados, peanut butter, and most nuts, or polyunsaturated fats, such as sunflower, corn, and soybean oils, walnuts, pine nuts, sesame seeds, sunflower seeds, and flaxseed. Beverages  Aim for six 8-oz glasses of water per day. Limit coffee to three to five 8-oz cups per day.  Limit caffeinated beverages that have added calories, such as soda and energy drinks.  Limit alcohol intake to no more  than 1 drink a day for nonpregnant women and 2 drinks a day for men. One drink equals 12 oz of beer (355 mL), 5 oz of wine (148 mL), or 1 oz of hard liquor (44 mL). Seasoning and other foods  Avoid adding excess amounts of salt to your foods. Try flavoring foods with herbs and spices instead of salt.  Avoid adding sugar to foods.  Try using oil-based dressings, sauces, and spreads instead of solid fats. This information is based on general [place] nutrition guidelines. For more information, visit BuildDNA.es. Exact amounts may vary based on your nutrition needs. Summary  A healthy eating plan may help you to maintain a healthy weight, reduce the risk of chronic diseases, and stay active throughout your life.  Plan your meals. Make sure  you eat the right portions of a variety of nutrient-rich foods.  Try baking, boiling, grilling, or broiling instead of frying.  Choose healthy options in all settings, including home, work, school, restaurants, or stores. This information is not intended to replace advice given to you by your health care provider. Make sure you discuss any questions you have with your health care provider. Document Revised: 01/19/2018 Document Reviewed: 01/19/2018 Elsevier Patient Education  [place].   Patient verbalizes understanding of instructions provided today.   Telephone follow up appointment with Managed Medicaid care management team member scheduled for:12/18/20 @ 10:30am  [name], RN

## 2020-10-18 NOTE — Patient Outreach (Signed)
Medicaid Managed Care   Nurse Care Manager Note  10/18/2020 Name:  Debra Williamson MRN:  [id number] DOB:  1981-01-24  Debra Williamson is an 39 y.o. year old female who is a primary patient of Debra Locks, FNP.  The [facility] Managed Care Coordination team was consulted for assistance with:    Sickle Cell Anemia  Debra Williamson was given information about Medicaid Managed Care Coordination team services today. Debra Williamson agreed to services and verbal consent obtained.  Engaged with patient by telephone for initial visit in response to provider referral for case management and/or care coordination services.   Assessments/Interventions:  Review of past medical history, allergies, medications, health status, including review of consultants reports, laboratory and other test data, was performed as part of comprehensive evaluation and provision of chronic care management services.  SDOH (Social Determinants of Health) assessments and interventions performed:   Care Plan  Allergies  Allergen Reactions  . Zofran [Ondansetron Hcl] Hives and Itching  . Demerol [Meperidine] Other (See Comments)    Pt has seizures  . Fentanyl And Related Nausea And Vomiting and Other (See Comments)    Very sedated  **Not allergic to the injection** JUST ALLERGIC TO PATCH  . Other Itching    Greek Yogurt  . Codeine Hives and Itching  . Hydrocodone Hives and Itching  . Ibuprofen     Medications Reviewed Today    Reviewed by Heidi Dach, RN (Registered Nurse) on 10/18/20 at 1037  Med List Status: <None>  Medication Order Taking? Sig Documenting Provider Last Dose Status Informant  albuterol (VENTOLIN HFA) 108 (90 Base) MCG/ACT inhaler [id number] Yes INHALE 2 PUFFS BY MOUTH INTO THE LUNGS EVERY 4 HOURS AS NEEDED FOR WHEEZING OR SHORTNESS OF BREATH AND COUGH Debra Locks, FNP Taking Active   ascorbic acid (VITAMIN C) 1000 MG tablet [id number] Yes Take by mouth. [provider] Taking  Active   aspirin EC 81 MG tablet [id number] Yes Take 1 tablet by mouth every evening.  [provider] Taking Active Self           Med Note Bunnie Philips   Sat Mar 11, 2015  7:32 PM) .   calcium carbonate (OS-CAL) 600 MG TABS tablet [id number] No Take one tablet once daily after finishing the Drisdol.  Patient not taking: Reported on 10/18/2020   [provider] Not Taking Active   cetirizine (ZYRTEC) 10 MG tablet [id number] Yes Take 1 tablet (10 mg total) by mouth daily. Debra Locks, FNP Taking Active   Deferasirox 360 MG TABS [id number] No TAKE 2 TABLETS BY MOUTH ONCE A DAY  Patient not taking: No sig reported   [provider] Not Taking Active   diclofenac sodium (VOLTAREN) 1 % GEL [id number] No Apply 2 g topically 4 (four) times daily.  Patient not taking: Reported on 10/18/2020   Bing Neighbors, FNP Not Taking Active Self           Med Note Debra Williamson   Fri Mar 05, 2019  8:54 AM) As needed.   folic acid (FOLVITE) 1 MG tablet [id number] Yes Take 1 mg by mouth daily.  [provider] Taking Active Self           Med Note Bunnie Philips   Sat Mar 11, 2015  7:32 PM) .   hydrOXYzine (ATARAX/VISTARIL) 25 MG tablet [id number] Yes Take 0.5-1 tablets (12.5-25 mg total) by mouth every 8 (eight) hours as needed for itching. Bradly Chris,  Rolm Gala, FNP Taking Active   lamoTRIgine (LAMICTAL) 200 MG tablet [id number] Yes TAKE 1 TABLET (200 MG TOTAL) BY MOUTH 2 TIMES DAILY. Debra Locks, FNP Taking Active   levETIRAcetam (KEPPRA) 1000 MG tablet [id number] Yes Take 1 tablet (1,000 mg total) by mouth 2 (two) times daily. Debra Locks, FNP Taking Active   lidocaine-prilocaine (EMLA) cream [id number] Yes APPLY TO THE AFFECTED AREA(S) AS NEEDED Debra Locks, FNP Taking Active   meloxicam (MOBIC) 15 MG tablet [id number] Yes Take 1 tablet (15 mg total) by mouth daily. Debra Locks, FNP Taking Active            Med Note Debra Williamson    Mon Aug 23, 2019 10:09 AM) As needed   omeprazole (PRILOSEC) 40 MG capsule [id number] No Take 1 capsule (40 mg total) by mouth daily.  Patient not taking: Reported on 10/18/2020   Debra Locks, FNP Not Taking Active   oxyCODONE ER [facility] ER) 27 MG C12A [id number] Yes Take 27 mg by mouth every 12 (twelve) hours. Debra Locks, FNP Taking Active   Oxycodone HCl 10 MG TABS [id number] Yes Take 1 tablet (10 mg total) by mouth every 4 (four) hours as needed for up to 15 days (pain). Debra Locks, FNP Taking Active   promethazine (PHENERGAN) 25 MG tablet [id number] Yes Take 1 tablet (25 mg total) by mouth every 8 (eight) hours as needed for nausea or vomiting. 1 tablet every 8 hours prn Debra Locks, FNP Taking Active   traZODone (DESYREL) 50 MG tablet [id number] Yes Take 0.5-1 tablets (25-50 mg total) by mouth at bedtime as needed for sleep. Debra Locks, FNP Taking Active   zolpidem (AMBIEN) 5 MG tablet [id number] Yes Take 1 tablet (5 mg total) by mouth at bedtime as needed. for sleep Debra Locks, FNP Taking Active           Patient Active Problem List   Diagnosis Date Noted  . Weakness of lower extremity 02/27/2020  . Insomnia 08/24/2019  . Increased storage iron 08/24/2019  . Nausea 07/02/2019  . Chronic pain syndrome   . Sickle cell crisis (HCC) 11/26/2018  . Chest pain 11/25/2018  . Vaso-occlusive sickle cell crisis (HCC) 11/25/2018  . Sickle cell pain crisis (HCC) 11/24/2018  . Chronic, continuous use of opioids 10/27/2017  . 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 TIA (transient ischemic attack)   . Vertigo 09/07/2016  . Dyspnea 09/07/2016  . Lightheadedness 09/06/2016  . Healthcare maintenance 07/30/2016  . Dizziness, nonspecific 07/30/2016  . Left foot drop 07/30/2016  . Left-sided weakness 07/30/2016  . 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 History of stroke associated with blood clotting tendency 01/02/2016  . [place] disease (HCC) 10/27/2015  . Hb-SS disease with acute chest syndrome (HCC) 08/14/2015  . Acute chest syndrome (HCC)   . Acute respiratory failure with hypoxemia (HCC)   . Community acquired pneumonia 08/08/2015  . Hemiparesis following cerebrovascular accident (CVA) (HCC) 05/30/2015  . Ankle gives out 05/30/2015  . Seizure disorder (HCC) 05/30/2015  . 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 H/O: stroke 03/12/2015  . Seizures (HCC) 03/12/2015  . Sickle cell disease (HCC) 03/11/2015  . Symptomatic anemia 03/11/2015    Conditions to be addressed/monitored per PCP order:  sickle cell anemia  Patient Care Plan: Sickle Cell Disease (Adult)    Problem Identified:  Managing Sickle Cell Anemia     Long-Range Goal: Managing Sickle Cell Anemia   Start Date: 10/18/2020  Expected End Date: 12/18/2020  This Visit's Progress: On track  Priority: Medium  Note:   Current Barriers:  . Chronic Disease Management support and education needs related to managing sickle cell anemia and weight loss -Ms Burdine is currently managing symptoms of sickle cell anemia with medications, routine office visits, hydration, and a healthy diet. She would like to lose weight to assist in the benefits of her symptom management plan. Her last hospitalization was in 2020. Marland Kitchen Housing needs- Ms Dilday has housing, but would like assistance with finding new housing  Nurse Case Manager Clinical Goal(s):  Marland Kitchen Over the next 60 days, patient will meet with RN Care Manager to address progress with weight loss . Over the next 60 days, patient will attend all scheduled medical appointments . Over the next 30 days, patient will work with CM team pharmacist to review medications . Over the next 30 days, patient will work with Care Guide for assistance with Housing   Interventions:  . Inter-disciplinary care team  collaboration (see longitudinal plan of care) . Reviewed medications with patient and discussed taking Oxycodone ER twice a day for pain management and taking Oxycodone 10 mg as needed for increased pain . Discussed plans with patient for ongoing care management follow up and provided patient with direct contact information for care management team . Provided patient with mychart educational materials related to healthy diet . Care Guide referral for assistance with housing . Pharmacy referral for medication review  Patient Goals/Self-Care Activities Over the next 60 days, patient will:  -Self administers medications as prescribed - Attends all scheduled provider appointments - Calls pharmacy for medication refills - Performs ADL's independently - Calls provider office for new concerns or questions - develop personal pain management plan - plan exercise or activity when pain is best controlled - track times pain is worst and when it is best - track what makes the pain worse and what makes it better - use ice or heat for pain relief - use relaxation during pain  Follow Up Plan: Telephone follow up appointment with Managed Medicaid care management team member scheduled for:12/18/20 @ 10:30am        Follow Up:  Patient agrees to Care Plan and Follow-up.  Plan: The Managed Medicaid care management team will reach out to the patient again over the next 14 days.  Date/time of next scheduled RN care management/care coordination outreach:  12/18/20 @ 10:30am  Estanislado Emms RN, BSN [place]  [facility] RN Care Coordinator

## 2020-10-24 ENCOUNTER — Other Ambulatory Visit: Payer: Self-pay

## 2020-10-24 ENCOUNTER — Telehealth: Payer: Self-pay | Admitting: Family Medicine

## 2020-10-24 NOTE — Telephone Encounter (Signed)
Referral Reason: Housing Assistance  Interventions: Successful outbound call placed to the patient Spoke with patient regarding regarding referral for housing assistance. Patient stated that she cannot apply to [employer] until she pays off her past due balance. Mrs. Nies stated that she will be taking care of her past due balance soon. Asked patient if she would like to receive resources via mail or e-mail. Patient stated that she would like to receive the resources via e-mail. Verified e-mail address on file with patient [email or web address].   Follow up plan: Care guide will follow up with patient by phone over the next week and also research housing resources for [facility] and sent to Mrs. Takeshita via e-mail.

## 2020-10-24 NOTE — Patient Outreach (Signed)
Medicaid Managed Care    Pharmacy Note  10/24/2020 Name: Debra Williamson MRN: [id number] DOB: 04/29/1981  Debra Williamson is a 40 y.o. year old female who is a primary care patient of Kallie Locks, FNP. The [facility] Managed Care Coordination team was consulted for assistance with disease management and care coordination needs.    Engaged with patient Engaged with patient by telephone for initial visit in response to referral for case management and/or care coordination services.  Debra Williamson was given information about Managed Medicaid Care Coordination team services today. Debra Williamson agreed to services and verbal consent obtained.   Objective:  Lab Results  Component Value Date   CREATININE 0.67 11/30/2018   CREATININE 0.43 (L) 11/26/2018   CREATININE 0.52 11/25/2018    Lab Results  Component Value Date   HGBA1C <4.2 (L) 09/07/2016       Component Value Date/Time   CHOL 128 09/07/2016 0337   TRIG 211 (H) 09/07/2016 0337   HDL 26 (L) 09/07/2016 0337   CHOLHDL 4.9 09/07/2016 0337   VLDL 42 (H) 09/07/2016 0337   LDLCALC 60 09/07/2016 0337    Other: (TSH, CBC, Vit D, etc.)  Clinical ASCVD: No  The ASCVD Risk score Denman George DC Jr., et al., 2013) failed to calculate for the following reasons:   Cannot find a previous HDL lab   Cannot find a previous total cholesterol lab    Other: (CHADS2VASc if Afib, PHQ9 if depression, MMRC or CAT for COPD, ACT, DEXA)  BP Readings from Last 3 Encounters:  10/06/20 (!) 119/58  07/26/20 126/80  05/24/20 112/63    Assessment/Interventions: Review of patient past medical history, allergies, medications, health status, including review of consultants reports, laboratory and other test data, was performed as part of comprehensive evaluation and provision of chronic care management services.    Sickle Cell Disease Managed by Hematologist Folic Acid 1mg  QD Plan: At goal,  patient stable/ symptoms controlled   Chronic  Pain Pain scale: 10/25/19: Didn't state scale but patient said medications aren't working at all and her new one takes 4 hours to kick in Oxycodone 10mg  IR Xtampza (Oxycodone 27 ER) (Takes too long to work) -Wasn't sure why she was taking multiple forms and why it didn't work Plan: Explained difference between ER and IR and possible strategies to improve relief  Seizures Lamotrigine 200mg  QD Levetiracetam 1000mg  BID Plan: At goal,  patient stable/ symptoms controlled   Insomnia Zolpidem 5mg  HS Tried/failed: Trazodone (Stuffy nose and dizzy) Plan: At goal,  patient stable/ symptoms controlled    Medications -Doesn't organize medications -Keeps in backpack -Outpatient at 12/23/19 (Husband works at hospital and picks them up)   SDOH (Social Determinants of Health) assessments and interventions performed:    Care Plan  Allergies  Allergen Reactions  . Zofran [Ondansetron Hcl] Hives and Itching  . Demerol [Meperidine] Other (See Comments)    Pt has seizures  . Fentanyl And Related Nausea And Vomiting and Other (See Comments)    Very sedated  **Not allergic to the injection** JUST ALLERGIC TO PATCH  . Other Itching    Greek Yogurt  . Trazodone And Nefazodone Other (See Comments)    Patient was dizzy and had stuffy nose   . Codeine Hives and Itching  . Hydrocodone Hives and Itching  . Ibuprofen     Medications Reviewed Today    Reviewed by , [facility] (Pharmacist) on 10/24/20 at 1354  Med List Status: <None>  Medication Order Taking?  Sig Documenting Provider Last Dose Status Informant  albuterol (VENTOLIN HFA) 108 (90 Base) MCG/ACT inhaler [id number] Yes INHALE 2 PUFFS BY MOUTH INTO THE LUNGS EVERY 4 HOURS AS NEEDED FOR WHEEZING OR SHORTNESS OF BREATH AND COUGH [name], FNP Taking Active            Med Note [name], Arlester Marker   Tue Oct 24, 2020  1:51 PM) Taking PRN, 2x/week  ascorbic acid (VITAMIN C) 1000 MG tablet [id number] No Take by mouth.   Patient not taking: Reported on 10/24/2020   [provider] Not Taking Active   aspirin EC 81 MG tablet [id number] Yes Take 1 tablet by mouth every evening.  [provider] Taking Active Self           Med Note [name]   Sat Mar 11, 2015  7:32 PM) .   calcium carbonate (OS-CAL) 600 MG TABS tablet [id number] No Take one tablet once daily after finishing the Drisdol.  Patient not taking: No sig reported   [provider] Not Taking Active   cetirizine (ZYRTEC) 10 MG tablet [id number] No Take 1 tablet (10 mg total) by mouth daily.  Patient not taking: Reported on 10/24/2020   [name], FNP Not Taking Consider Medication Status and Discontinue   Deferasirox 360 MG TABS [id number] No TAKE 2 TABLETS BY MOUTH ONCE A DAY  Patient not taking: No sig reported   [provider] Not Taking Consider Medication Status and Discontinue   diclofenac sodium (VOLTAREN) 1 % GEL [id number] No Apply 2 g topically 4 (four) times daily.  Patient not taking: No sig reported   Scot Jun, FNP Not Taking Consider Medication Status and Discontinue            Med Note [name]   Fri Mar 05, 2019  8:54 AM) As needed.   folic acid (FOLVITE) 1 MG tablet [id number] Yes Take 1 mg by mouth daily.  [provider] Taking Active Self           Med Note [name]   Sat Mar 11, 2015  7:32 PM) .   hydrOXYzine (ATARAX/VISTARIL) 25 MG tablet [id number] Yes Take 0.5-1 tablets (12.5-25 mg total) by mouth every 8 (eight) hours as needed for itching. [name], FNP Taking Active   lamoTRIgine (LAMICTAL) 200 MG tablet [id number] Yes TAKE 1 TABLET (200 MG TOTAL) BY MOUTH 2 TIMES DAILY. [name], FNP Taking Active   levETIRAcetam (KEPPRA) 1000 MG tablet [id number] Yes Take 1 tablet (1,000 mg total) by mouth 2 (two) times daily. [name], FNP Taking Active   lidocaine-prilocaine (EMLA) cream [id number] Yes APPLY TO THE AFFECTED  AREA(S) AS NEEDED [name], FNP Taking Active   meloxicam (MOBIC) 15 MG tablet [id number] No Take 1 tablet (15 mg total) by mouth daily.  Patient not taking: Reported on 10/24/2020   [name], FNP Not Taking Consider Medication Status and Discontinue            Med Note Tilden Dome Aug 23, 2019 10:09 AM) As needed   omeprazole (PRILOSEC) 40 MG capsule [id number] No Take 1 capsule (40 mg total) by mouth daily.  Patient not taking: No sig reported   [name], FNP Not Taking Consider Medication Status and Discontinue   oxyCODONE ER [facility] ER) 27 MG C12A [id number] Yes Take 27 mg by mouth every 12 (twelve) hours. Kathe Becton  M, FNP Taking Active   Oxycodone HCl 10 MG TABS [id number] Yes Take 1 tablet (10 mg total) by mouth every 4 (four) hours as needed for up to 15 days (pain). Kallie Locks, FNP Taking Active   promethazine (PHENERGAN) 25 MG tablet [id number] Yes Take 1 tablet (25 mg total) by mouth every 8 (eight) hours as needed for nausea or vomiting. 1 tablet every 8 hours prn Kallie Locks, FNP Taking Active   traZODone (DESYREL) 50 MG tablet [id number] No Take 0.5-1 tablets (25-50 mg total) by mouth at bedtime as needed for sleep.  Patient not taking: Reported on 10/24/2020   Kallie Locks, FNP Not Taking Consider Medication Status and Discontinue            Med Note Kyung Rudd, Gardner Candle Oct 24, 2020  1:42 PM) Patient was dizzy and had stuffy nose  zolpidem (AMBIEN) 5 MG tablet [id number] Yes Take 1 tablet (5 mg total) by mouth at bedtime as needed. for sleep Kallie Locks, FNP Taking Active           Patient Active Problem List   Diagnosis Date Noted  . Weakness of lower extremity 02/27/2020  . Insomnia 08/24/2019  . Increased storage iron 08/24/2019  . Nausea 07/02/2019  . Chronic pain syndrome   . Sickle cell crisis (HCC) 11/26/2018  . Chest pain 11/25/2018  . Vaso-occlusive sickle cell crisis (HCC) 11/25/2018  . Sickle cell  pain crisis (HCC) 11/24/2018  . Chronic, continuous use of opioids 10/27/2017  . 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 TIA (transient ischemic attack)   . Vertigo 09/07/2016  . Dyspnea 09/07/2016  . Lightheadedness 09/06/2016  . Healthcare maintenance 07/30/2016  . Dizziness, nonspecific 07/30/2016  . Left foot drop 07/30/2016  . Left-sided weakness 07/30/2016  . 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 History of stroke associated with blood clotting tendency 01/02/2016  . [place] disease (HCC) 10/27/2015  . Hb-SS disease with acute chest syndrome (HCC) 08/14/2015  . Acute chest syndrome (HCC)   . Acute respiratory failure with hypoxemia (HCC)   . Community acquired pneumonia 08/08/2015  . Hemiparesis following cerebrovascular accident (CVA) (HCC) 05/30/2015  . Ankle gives out 05/30/2015  . Seizure disorder (HCC) 05/30/2015  . 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 H/O: stroke 03/12/2015  . Seizures (HCC) 03/12/2015  . Sickle cell disease (HCC) 03/11/2015  . Symptomatic anemia 03/11/2015    Conditions to be addressed/monitored: Sickle Cell Disease and Chronic Pain  Patient Care Plan: Sickle Cell Disease (Adult)    Problem Identified: Managing Sickle Cell Anemia     Long-Range Goal: Managing Sickle Cell Anemia   Start Date: 10/18/2020  Expected End Date: 12/18/2020  This Visit's Progress: On track  Priority: Medium  Note:   Current Barriers:  . Chronic Disease Management support and education needs related to managing sickle cell anemia and weight loss -Debra Williamson is currently managing symptoms of sickle cell anemia with medications, routine office visits, hydration, and a healthy diet. She would like to lose weight to assist in the benefits of her symptom management plan. Her last hospitalization was in 2020. Marland Kitchen Housing needs-  Debra Williamson has housing, but would like assistance with finding new housing  Nurse Case Manager Clinical Goal(s):  Marland Kitchen Over the next 60 days, patient will meet with RN Care Manager to address progress with weight loss .  Over the next 60 days, patient will attend all scheduled medical appointments . Over the next 30 days, patient will work with CM team pharmacist to review medications . Over the next 30 days, patient will work with Care Guide for assistance with Housing   Interventions:  . Inter-disciplinary care team collaboration (see longitudinal plan of care) . Reviewed medications with patient and discussed taking Oxycodone ER twice a day for pain management and taking Oxycodone 10 mg as needed for increased pain . Discussed plans with patient for ongoing care management follow up and provided patient with direct contact information for care management team . Provided patient with mychart educational materials related to healthy diet . Care Guide referral for assistance with housing . Pharmacy referral for medication review  Patient Goals/Self-Care Activities Over the next 60 days, patient will:  -Self administers medications as prescribed - Attends all scheduled provider appointments - Calls pharmacy for medication refills - Performs ADL's independently - Calls provider office for new concerns or questions - develop personal pain management plan - plan exercise or activity when pain is best controlled - track times pain is worst and when it is best - track what makes the pain worse and what makes it better - use ice or heat for pain relief - use relaxation during pain  Follow Up Plan: Telephone follow up appointment with Managed Medicaid care management team member scheduled for:12/18/20 @ 10:30am      Problem Identified: Health Promotion or Disease Self-Management (General Plan of Care)     Goal: Self-Management Plan Developed   Note:   Evidence-based guidance:   Review  biopsychosocial determinants of health screens.   Determine level of modifiable health risk.   Assess level of patient activation, level of readiness, importance and confidence to make changes.   Evoke change talk using open-ended questions, pros and cons, as well as looking forward.   Identify areas where behavior change may lead to improved health.   Partner with patient to develop a robust self-management plan that includes lifestyle factors, such as weight loss, exercise and healthy nutrition, as well as goals specific to disease risks.   Support patient and family/caregiver active participation in decision-making and self-management plan.   Implement additional goals and interventions based on identified risk factors to reduce health risk.   Facilitate advance care planning.   Review need for preventive screening based on age, sex, family history and health history.   Notes:    Task: Medication Management   Note:   Current Barriers:  . Does not adhere to prescribed medication regimen o Patient taking pain medications incorrectly and therefor aren't working  Pharmacist Clinical Goal(s):  Marland Kitchen Over the next 60 days, patient will adhere to prescribed medication regimen as evidenced by guidelines  through collaboration with PharmD and provider.  .   Interventions: . Inter-disciplinary care team collaboration (see longitudinal plan of care) . Comprehensive medication review performed; medication list updated in electronic medical record  Explained the difference between ER and IR medications and their usage in Pain Management  Chronic Pain  Patient Goals/Self-Care Activities . Over the next 60 days, patient will:  - take medications as prescribed  Follow Up Plan: The care management team will reach out to the patient again over the next 60 days.          Medication Assistance: None required. Patient affirms current coverage meets needs.   Follow up: Agree/Does not  agree  Plan: The care management team will reach  out to the patient again over the next 60 days.   Date/time of next scheduled Pharmacist care management/care coordination outreach:    Artelia Laroche, Pharm.D., Managed Medicaid Pharmacist - [phone]

## 2020-10-24 NOTE — Patient Instructions (Signed)
Visit Information  Debra Williamson was given information about Medicaid Managed Care team care coordination services as a part of their Healthy [employer] Medicaid benefit. Debra Williamson verbally consented to engagement with the [facility] Managed Care team.   For questions related to your Healthy [facility] health plan, please call: [phone] or visit the homepage here: [email or web address]  If you would like to schedule transportation through your Healthy [facility] plan, please call the following number at least 2 days in advance of your appointment: [phone]  Goals Addressed   None    Patient Care Plan: Sickle Cell Disease (Adult)    Problem Identified: Managing Sickle Cell Anemia     Long-Range Goal: Managing Sickle Cell Anemia   Start Date: 10/18/2020  Expected End Date: 12/18/2020  This Visit's Progress: On track  Priority: Medium  Note:   Current Barriers:  . Chronic Disease Management support and education needs related to managing sickle cell anemia and weight loss -Debra Williamson is currently managing symptoms of sickle cell anemia with medications, routine office visits, hydration, and a healthy diet. She would like to lose weight to assist in the benefits of her symptom management plan. Her last hospitalization was in 2020. Marland Kitchen Housing needs- Debra Williamson has housing, but would like assistance with finding new housing  Nurse Case Manager Clinical Goal(s):  Marland Kitchen Over the next 60 days, patient will meet with RN Care Manager to address progress with weight loss . Over the next 60 days, patient will attend all scheduled medical appointments . Over the next 30 days, patient will work with CM team pharmacist to review medications . Over the next 30 days, patient will work with Care Guide for assistance with Housing   Interventions:  . Inter-disciplinary care team collaboration (see longitudinal plan of care) . Reviewed medications with patient and  discussed taking Oxycodone ER twice a day for pain management and taking Oxycodone 10 mg as needed for increased pain . Discussed plans with patient for ongoing care management follow up and provided patient with direct contact information for care management team . Provided patient with mychart educational materials related to healthy diet . Care Guide referral for assistance with housing . Pharmacy referral for medication review  Patient Goals/Self-Care Activities Over the next 60 days, patient will:  -Self administers medications as prescribed - Attends all scheduled provider appointments - Calls pharmacy for medication refills - Performs ADL's independently - Calls provider office for new concerns or questions - develop personal pain management plan - plan exercise or activity when pain is best controlled - track times pain is worst and when it is best - track what makes the pain worse and what makes it better - use ice or heat for pain relief - use relaxation during pain  Follow Up Plan: Telephone follow up appointment with Managed Medicaid care management team member scheduled for:12/18/20 @ 10:30am      Problem Identified: Health Promotion or Disease Self-Management (General Plan of Care)     Goal: Self-Management Plan Developed   Note:   Evidence-based guidance:   Review biopsychosocial determinants of health screens.   Determine level of modifiable health risk.   Assess level of patient activation, level of readiness, importance and confidence to make changes.   Evoke change talk using open-ended questions, pros and cons, as well as looking forward.   Identify areas where behavior change may lead to improved health.   Partner with patient to develop a robust self-management plan that includes lifestyle factors,  such as weight loss, exercise and healthy nutrition, as well as goals specific to disease risks.   Support patient and family/caregiver active participation  in decision-making and self-management plan.   Implement additional goals and interventions based on identified risk factors to reduce health risk.   Facilitate advance care planning.   Review need for preventive screening based on age, sex, family history and health history.   Notes:    Task: Medication Management   Note:   Current Barriers:  . Does not adhere to prescribed medication regimen o Patient taking pain medications incorrectly and therefor aren't working  Pharmacist Clinical Goal(s):  Marland Kitchen Over the next 60 days, patient will adhere to prescribed medication regimen as evidenced by guidelines  through collaboration with PharmD and provider.  .   Interventions: . Inter-disciplinary care team collaboration (see longitudinal plan of care) . Comprehensive medication review performed; medication list updated in electronic medical record  Explained the difference between ER and IR medications and their usage in Pain Management  Chronic Pain  Patient Goals/Self-Care Activities . Over the next 60 days, patient will:  - take medications as prescribed  Follow Up Plan: The care management team will reach out to the patient again over the next 60 days.       Please see education materials related to Chronic Pain provided as print materials.   Patient verbalizes understanding of instructions provided today.   The Managed Medicaid care management team will reach out to the patient again over the next 60 days.   Debra Williamson, [facility]

## 2020-10-26 DIAGNOSIS — U071 COVID-19: Secondary | ICD-10-CM | POA: Diagnosis not present

## 2020-10-27 ENCOUNTER — Encounter: Payer: Medicaid Other | Admitting: Family Medicine

## 2020-10-27 DIAGNOSIS — D571 Sickle-cell disease without crisis: Secondary | ICD-10-CM | POA: Diagnosis not present

## 2020-10-30 DIAGNOSIS — Z79899 Other long term (current) drug therapy: Secondary | ICD-10-CM | POA: Diagnosis not present

## 2020-10-30 DIAGNOSIS — D571 Sickle-cell disease without crisis: Secondary | ICD-10-CM | POA: Diagnosis not present

## 2020-10-31 NOTE — Telephone Encounter (Signed)
° °  SF 10/31/2020    Name: Debra Williamson    MRN: [id number]    DOB: [birth year]    AGE: 40 y.o.    GENDER: female    PCP Kallie Locks, FNP.   Referral Reason: Housing Assistance  Interventions: Successful outbound call placed to the patient Discussed resources to assist with housing. Patient stated that she has received e-mail with housing resources and no that she has not addtional needs at this time.   Follow up plan: No further follow up planned at this time. The patient has been provided with needed resources.    [facility] Care Guide, Embedded Care Coordination [facility], Care Management Phone: [phone] Email: [email or web address]

## 2020-11-13 ENCOUNTER — Telehealth: Payer: Self-pay | Admitting: Family Medicine

## 2020-11-13 ENCOUNTER — Other Ambulatory Visit: Payer: Self-pay | Admitting: Family Medicine

## 2020-11-13 DIAGNOSIS — D571 Sickle-cell disease without crisis: Secondary | ICD-10-CM

## 2020-11-13 DIAGNOSIS — Z79899 Other long term (current) drug therapy: Secondary | ICD-10-CM

## 2020-11-13 DIAGNOSIS — F1190 Opioid use, unspecified, uncomplicated: Secondary | ICD-10-CM

## 2020-11-13 DIAGNOSIS — G894 Chronic pain syndrome: Secondary | ICD-10-CM

## 2020-11-13 MED ORDER — OXYCODONE HCL 10 MG PO TABS: 10.0000 mg | ORAL_TABLET | ORAL | 0 refills | Status: DC | PRN

## 2020-11-13 MED ORDER — XTAMPZA ER 27 MG PO C12A
27.0000 mg | EXTENDED_RELEASE_CAPSULE | Freq: Two times a day (BID) | ORAL | 0 refills | Status: DC
Start: 2020-11-16 — End: 2020-12-15

## 2020-11-13 NOTE — Telephone Encounter (Signed)
Done

## 2020-11-16 MED FILL — oxyCODONE HCL 10 MG TABS: 10 | 15 days supply | Qty: 90 | Fill #0

## 2020-11-20 ENCOUNTER — Encounter: Payer: Self-pay | Admitting: Family Medicine

## 2020-11-24 ENCOUNTER — Other Ambulatory Visit: Payer: Self-pay | Admitting: Family Medicine

## 2020-11-24 ENCOUNTER — Telehealth: Payer: Self-pay | Admitting: Family Medicine

## 2020-11-24 DIAGNOSIS — R569 Unspecified convulsions: Secondary | ICD-10-CM

## 2020-11-24 MED FILL — SUBVENITE 200 MG TABS: 200 | 90 days supply | Qty: 180 | Fill #0

## 2020-11-24 MED FILL — levETIRAcetam 1000 MG TABS: 1000 | 90 days supply | Qty: 180 | Fill #0

## 2020-11-24 NOTE — Telephone Encounter (Signed)
Done

## 2020-11-29 DIAGNOSIS — D571 Sickle-cell disease without crisis: Secondary | ICD-10-CM | POA: Diagnosis not present

## 2020-11-30 DIAGNOSIS — Z79899 Other long term (current) drug therapy: Secondary | ICD-10-CM | POA: Diagnosis not present

## 2020-11-30 DIAGNOSIS — D571 Sickle-cell disease without crisis: Secondary | ICD-10-CM | POA: Diagnosis not present

## 2020-12-05 ENCOUNTER — Other Ambulatory Visit: Payer: Self-pay

## 2020-12-05 ENCOUNTER — Encounter: Payer: Self-pay | Admitting: Family Medicine

## 2020-12-05 ENCOUNTER — Ambulatory Visit (INDEPENDENT_AMBULATORY_CARE_PROVIDER_SITE_OTHER): Payer: Medicaid Other | Admitting: Family Medicine

## 2020-12-05 VITALS — BP 117/63 | HR 86 | Ht 64.0 in | Wt 176.0 lb

## 2020-12-05 DIAGNOSIS — G894 Chronic pain syndrome: Secondary | ICD-10-CM | POA: Diagnosis not present

## 2020-12-05 DIAGNOSIS — F1190 Opioid use, unspecified, uncomplicated: Secondary | ICD-10-CM | POA: Diagnosis not present

## 2020-12-05 DIAGNOSIS — R569 Unspecified convulsions: Secondary | ICD-10-CM

## 2020-12-05 DIAGNOSIS — Z09 Encounter for follow-up examination after completed treatment for conditions other than malignant neoplasm: Secondary | ICD-10-CM

## 2020-12-05 DIAGNOSIS — D571 Sickle-cell disease without crisis: Secondary | ICD-10-CM | POA: Diagnosis not present

## 2020-12-05 NOTE — Progress Notes (Signed)
Patient Care Center Internal Medicine and Sickle Cell Care    Established Patient Office Visit  Subjective:  Patient ID: Debra Williamson, female    DOB: 1981-06-15  Age: 40 y.o. MRN: [id number]  CC:  Chief Complaint  Patient presents with  . Sickle Cell Anemia    HPI Debra Williamson is a 40 year old female who presents for Follow Up today.    Patient Active Problem List   Diagnosis Date Noted  . Weakness of lower extremity 02/27/2020  . Insomnia 08/24/2019  . Increased storage iron 08/24/2019  . Nausea 07/02/2019  . Chronic pain syndrome   . Sickle cell crisis (HCC) 11/26/2018  . Chest pain 11/25/2018  . Vaso-occlusive sickle cell crisis (HCC) 11/25/2018  . Sickle cell pain crisis (HCC) 11/24/2018  . Chronic, continuous use of opioids 10/27/2017  . 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 TIA (transient ischemic attack)   . Vertigo 09/07/2016  . Dyspnea 09/07/2016  . Lightheadedness 09/06/2016  . Healthcare maintenance 07/30/2016  . Dizziness, nonspecific 07/30/2016  . Left foot drop 07/30/2016  . Left-sided weakness 07/30/2016  . 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 History of stroke associated with blood clotting tendency 01/02/2016  . [place] disease (HCC) 10/27/2015  . Hb-SS disease with acute chest syndrome (HCC) 08/14/2015  . Acute chest syndrome (HCC)   . Acute respiratory failure with hypoxemia (HCC)   . Community acquired pneumonia 08/08/2015  . Hemiparesis following cerebrovascular accident (CVA) (HCC) 05/30/2015  . Ankle gives out 05/30/2015  . Seizure disorder (HCC) 05/30/2015  . 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 H/O: stroke 03/12/2015  . Seizures (HCC) 03/12/2015  . Sickle cell disease (HCC) 03/11/2015  . Symptomatic anemia 03/11/2015   Current Status: Since her last office visit, she is  doing well with no complaints. She states that she has chronc pain in her back, arms and legs. She rates her pain today at 5/10. She has not had a hospital visit for Sickle Cell Crisis since 11/25/2018 where she was treated and discharged the same day. [facility] few years ago, which was her 1st and only visit. She is currently taking all medications as prescribed and staying well hydrated. She reports occasional nausea, constipation, dizziness and headaches. She last took pain medications this morning. She denies fevers, chills, fatigue, recent infections, weight loss, and night sweats. She has not had any visual changes, and falls. No chest pain, heart palpitations, cough and shortness of breath reported. Denies GI problems such as vomiting, diarrhea, and constipation. She has no reports of blood in stools, dysuria and hematuria. She is Her anxiety is increased r/t stressors involving medication changes. She denies suicidal ideations, homicidal ideations, or auditory hallucinations.    Past Medical History:  Diagnosis Date  . Anemia   . Blood transfusion without reported diagnosis   . Migraine 05/2019  . Seizures (HCC)    one time incident, may have been r/t to a pain medication she was prescribed  . Sickle cell disease (HCC)   . Stroke [facility])     Past Surgical History:  Procedure Laterality Date  . CESAREAN SECTION     x4  . CHOLECYSTECTOMY    . PORTACATH PLACEMENT Right w-3  . reverse tubal ligation    . TEE WITHOUT CARDIOVERSION N/A 09/10/2016   Procedure: TRANSESOPHAGEAL ECHOCARDIOGRAM (TEE);  Surgeon: Rachelle Hora  Croitoru, MD;  Location: [facility] ENDOSCOPY;  Service: Cardiovascular;  Laterality: N/A;    Family History  Problem Relation Age of Onset  . HIV/AIDS Father     Social History   Socioeconomic History  . Marital status: Married    Spouse name: Not on file  . Number of children: 4  . Years of education: Not on file  . Highest education  level: Not on file  Occupational History  . Occupation: Doesn't work  Tobacco Use  . Smoking status: Never Smoker  . Smokeless tobacco: Never Used  Vaping Use  . Vaping Use: Never used  Substance and Sexual Activity  . Alcohol use: No    Alcohol/week: 0.0 standard drinks    Comment: occasionally  . Drug use: No  . Sexual activity: Yes  Other Topics Concern  . Not on file  Social History Narrative   Right handed    Lives with husband and kids    Social Determinants of Health   Financial Resource Strain: Not on file  Food Insecurity: Not on file  Transportation Needs: Not on file  Physical Activity: Not on file  Stress: Not on file  Social Connections: Not on file  Intimate Partner Violence: Not on file    Outpatient Medications Prior to Visit  Medication Sig Dispense Refill  . albuterol (VENTOLIN HFA) 108 (90 Base) MCG/ACT inhaler INHALE 2 PUFFS BY MOUTH INTO THE LUNGS EVERY 4 HOURS AS NEEDED FOR WHEEZING OR SHORTNESS OF BREATH AND COUGH 8.5 g 11  . aspirin EC 81 MG tablet Take 1 tablet by mouth every evening.     . folic acid (FOLVITE) 1 MG tablet Take 1 mg by mouth daily.     . hydrOXYzine (ATARAX/VISTARIL) 25 MG tablet Take 0.5-1 tablets (12.5-25 mg total) by mouth every 8 (eight) hours as needed for itching. 60 tablet 6  . promethazine (PHENERGAN) 25 MG tablet Take 1 tablet (25 mg total) by mouth every 8 (eight) hours as needed for nausea or vomiting. 1 tablet every 8 hours prn 30 tablet 2  . lamoTRIgine (LAMICTAL) 200 MG tablet TAKE 1 TABLET (200 MG TOTAL) BY MOUTH 2 TIMES DAILY. 180 tablet 3  . levETIRAcetam (KEPPRA) 1000 MG tablet Take 1 tablet (1,000 mg total) by mouth 2 (two) times daily. 180 tablet 3  . lidocaine-prilocaine (EMLA) cream APPLY TO THE AFFECTED AREA(S) AS NEEDED 30 g 3  . oxyCODONE ER (XTAMPZA ER) 27 MG C12A Take 27 mg by mouth every 12 (twelve) hours. 60 capsule 0  . zolpidem (AMBIEN) 5 MG tablet Take 1 tablet (5 mg total) by mouth at bedtime as  needed. for sleep 30 tablet 0  . ascorbic acid (VITAMIN C) 1000 MG tablet Take by mouth. (Patient not taking: Reported on 10/24/2020)    . calcium carbonate (OS-CAL) 600 MG TABS tablet Take one tablet once daily after finishing the Drisdol. (Patient not taking: No sig reported)    . cetirizine (ZYRTEC) 10 MG tablet Take 1 tablet (10 mg total) by mouth daily. (Patient not taking: Reported on 10/24/2020) 30 tablet 11  . Deferasirox 360 MG TABS TAKE 2 TABLETS BY MOUTH ONCE A DAY (Patient not taking: No sig reported)    . diclofenac sodium (VOLTAREN) 1 % GEL Apply 2 g topically 4 (four) times daily. (Patient not taking: No sig reported) 100 g 1  . meloxicam (MOBIC) 15 MG tablet Take 1 tablet (15 mg total) by mouth daily. (Patient not taking: No sig reported) 30 tablet  3  . omeprazole (PRILOSEC) 40 MG capsule Take 1 capsule (40 mg total) by mouth daily. (Patient not taking: No sig reported) 30 capsule 6  . traZODone (DESYREL) 50 MG tablet Take 0.5-1 tablets (25-50 mg total) by mouth at bedtime as needed for sleep. (Patient not taking: Reported on 10/24/2020) 30 tablet 6   No facility-administered medications prior to visit.    Allergies  Allergen Reactions  . Zofran [Ondansetron Hcl] Hives and Itching  . Demerol [Meperidine] Other (See Comments)    Pt has seizures  . Fentanyl And Related Nausea And Vomiting and Other (See Comments)    Very sedated  **Not allergic to the injection** JUST ALLERGIC TO PATCH  . Other Itching    Greek Yogurt  . Trazodone And Nefazodone Other (See Comments)    Patient was dizzy and had stuffy nose   . Codeine Hives and Itching  . Hydrocodone Hives and Itching  . Ibuprofen     ROS Review of Systems  Constitutional: Negative.   HENT: Negative.   Eyes: Negative.   Respiratory: Negative.   Cardiovascular: Negative.   Gastrointestinal: Positive for abdominal distention (obese), constipation (occasional) and nausea (occasional).  Endocrine: Negative.    Genitourinary: Negative.   Musculoskeletal: Positive for arthralgias (generalized joint pain).  Allergic/Immunologic: Negative.   Neurological: Positive for dizziness (occasional ) and headaches (occasional).  Hematological: Negative.   Psychiatric/Behavioral: Negative.       Objective:    Physical Exam Vitals and nursing note reviewed.  Constitutional:      Appearance: Normal appearance.  HENT:     Head: Normocephalic and atraumatic.     Nose: Nose normal.     Mouth/Throat:     Mouth: Mucous membranes are moist.     Pharynx: Oropharynx is clear.  Cardiovascular:     Rate and Rhythm: Normal rate and regular rhythm.     Pulses: Normal pulses.     Heart sounds: Normal heart sounds.  Pulmonary:     Effort: Pulmonary effort is normal.     Breath sounds: Normal breath sounds.  Abdominal:     General: Bowel sounds are normal. There is distension.     Palpations: Abdomen is soft.  Musculoskeletal:        General: Normal range of motion.     Cervical back: Normal range of motion and neck supple.     Comments: Decreased ROM.   Skin:    General: Skin is warm and dry.  Neurological:     General: No focal deficit present.     Mental Status: She is alert and oriented to person, place, and time.  Psychiatric:        Mood and Affect: Mood normal.        Behavior: Behavior normal.        Thought Content: Thought content normal.        Judgment: Judgment normal.     BP 117/63   Pulse 86   Ht 5\' 4"  (1.626 m)   Wt 176 lb (79.8 kg)   SpO2 99%   BMI 30.21 kg/m  Wt Readings from Last 3 Encounters:  12/08/20 178 lb 3.2 oz (80.8 kg)  12/05/20 176 lb (79.8 kg)  10/06/20 180 lb 6.4 oz (81.8 kg)     Health Maintenance Due  Topic Date Due  . Hepatitis C Screening  Never done  . PAP SMEAR-Modifier  05/13/2020    There are no preventive care reminders to display for this patient.  Lab  Results  Component Value Date   TSH 0.668 06/21/2016   Lab Results  Component Value  Date   WBC 9.3 12/08/2018   HGB 7.7 (L) 12/08/2018   HCT 24.4 (L) 12/08/2018   MCV 88.7 12/08/2018   PLT 811 (H) 12/08/2018   Lab Results  Component Value Date   NA 137 11/30/2018   K 4.2 11/30/2018   CO2 24 11/30/2018   GLUCOSE 88 11/30/2018   BUN 10 11/30/2018   CREATININE 0.67 11/30/2018   BILITOT 2.3 (H) 11/30/2018   ALKPHOS 64 11/30/2018   AST 33 11/30/2018   ALT 25 11/30/2018   PROT 7.7 11/30/2018   ALBUMIN 4.4 11/30/2018   CALCIUM 8.9 11/30/2018   ANIONGAP 10 11/30/2018   Lab Results  Component Value Date   CHOL 128 09/07/2016   Lab Results  Component Value Date   HDL 26 (L) 09/07/2016   Lab Results  Component Value Date   LDLCALC 60 09/07/2016   Lab Results  Component Value Date   TRIG 211 (H) 09/07/2016   Lab Results  Component Value Date   CHOLHDL 4.9 09/07/2016   Lab Results  Component Value Date   HGBA1C <4.2 (L) 09/07/2016   Assessment & Plan:   1. Hb-SS disease without crisis [facility](HCC) She is doing well today r/t her chronic pain management. She will continue to take pain medications as prescribed; will continue to avoid extreme heat and cold; will continue to eat a healthy diet and drink at least 64 ounces of water daily; continue stool softener as needed; will avoid colds and flu; will continue to get plenty of sleep and rest; will continue to avoid high stressful situations and remain infection free; will continue Folic Acid 1 mg daily to avoid sickle cell crisis. Continue to follow up with Hematologist as needed.   2. Chronic pain syndrome  3. Chronic, continuous use of opioids  4. Seizures (HCC) Stable. No signs symptoms of recurrence noted or reported.   5. Follow up He will follow up in 2 months.   No orders of the defined types were placed in this encounter.   No orders of the defined types were placed in this encounter.    Referral Orders  No referral(s) requested today    Raliegh IpNatalie Vivienne Sangiovanni, MSN, ANE, FNP-BC [facility]  Patient Care Center/Internal Medicine/Sickle Cell Center [facility] [address]  [place], [place] [address] [phone]  Problem List Items Addressed This Visit      Other   Chronic pain syndrome   Chronic, continuous use of opioids   Hb-SS disease without crisis (HCC) - Primary   Seizures (HCC) (Chronic)    Other Visit Diagnoses    Follow up          No orders of the defined types were placed in this encounter.   Follow-up: No follow-ups on file.    Kallie LocksNatalie M Bairon Klemann, FNP

## 2020-12-08 ENCOUNTER — Other Ambulatory Visit: Payer: Self-pay | Admitting: Neurology

## 2020-12-08 ENCOUNTER — Other Ambulatory Visit: Payer: Self-pay

## 2020-12-08 ENCOUNTER — Ambulatory Visit (INDEPENDENT_AMBULATORY_CARE_PROVIDER_SITE_OTHER): Payer: Medicaid Other | Admitting: Neurology

## 2020-12-08 ENCOUNTER — Encounter: Payer: Self-pay | Admitting: Neurology

## 2020-12-08 VITALS — BP 109/75 | HR 89 | Ht 64.0 in | Wt 178.2 lb

## 2020-12-08 DIAGNOSIS — R569 Unspecified convulsions: Secondary | ICD-10-CM | POA: Diagnosis not present

## 2020-12-08 DIAGNOSIS — G40109 Localization-related (focal) (partial) symptomatic epilepsy and epileptic syndromes with simple partial seizures, not intractable, without status epilepticus: Secondary | ICD-10-CM | POA: Diagnosis not present

## 2020-12-08 MED ORDER — LEVETIRACETAM 1000 MG PO TABS
1000.0000 mg | ORAL_TABLET | Freq: Two times a day (BID) | ORAL | 3 refills | Status: DC
Start: 2020-12-08 — End: 2020-12-08

## 2020-12-08 MED ORDER — LAMOTRIGINE 200 MG PO TABS: ORAL_TABLET | ORAL | 3 refills | Status: DC

## 2020-12-08 NOTE — Patient Instructions (Signed)
1. Continue Keppra 1000mg  twice a day and Lamictal 200mg  twice a day  2. Keep a calendar of the facial twitching, if increasing in frequency, call our office and we will plan to increase medication  3. Follow-up in 6 months, call for any changes   Seizure Precautions: 1. If medication has been prescribed for you to prevent seizures, take it exactly as directed.  Do not stop taking the medicine without talking to your doctor first, even if you have not had a seizure in a long time.   2. Avoid activities in which a seizure would cause danger to yourself or to others.  Don't operate dangerous machinery, swim alone, or climb in high or dangerous places, such as on ladders, roofs, or girders.  Do not drive unless your doctor says you may.  3. If you have any warning that you may have a seizure, lay down in a safe place where you can't hurt yourself.    4.  No driving for 6 months from last seizure, as per [facility].   Please refer to the following link on the Epile[employer] Foundation of America's website for more information: http://www.epilepsyfoundation.org/answerplace/Social/driving/drivingu.cfm   5.  Maintain good sleep hygiene. Avoid alcohol.  6.  Notify your neurology if you are planning pregnancy or if you become pregnant.  7.  Contact your doctor if you have any problems that may be related to the medicine you are taking.  8.  Call 911 and bring the patient back to the ED if:        A.  The seizure lasts longer than 5 minutes.       B.  The patient doesn't awaken shortly after the seizure  C.  The patient has new problems such as difficulty seeing, speaking or moving  D.  The patient was injured during the seizure  E.  The patient has a temperature over 102 F (39C)  F.  The patient vomited and now is having trouble breathing

## 2020-12-08 NOTE — Progress Notes (Signed)
NEUROLOGY CONSULTATION NOTE  Debra Williamson MRN: [id number] DOB: 1981-04-11  Referring provider: Raliegh Ip, FNP Primary care provider: Raliegh Ip, FNP  Reason for consult:  seizures   Thank you for your kind referral of Debra Williamson for consultation of the above symptoms. Although her history is well known to you, please allow me to reiterate it for the purpose of our medical record. She is alone in the office today. Records and images were personally reviewed where available.   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 She was seen one time in our office in 2017 and was lost to follow-up. Her first seizure occurred in 2007 after she received an unrecalled pain medication. She reports starting seizure medication at that time. The second seizure occurred soon after when her son was a week old, she was holding him then started having twitching on her left side which progressed to a convulsion. She recalls being aware of the twitching, no speech difficulties or confusion. Lamotrigine and Levetiracetam doses was increased to her current regimen of Lamotrigine 200mg  BID and Levetiracetam 1000mg  BID. She was seizure-free for 10 years unilt a seizure in 2016 when she missed a day of medications and again had a focal seizure starting with left-sided twitching that progressed to a convulsion. She denies any seizures since then, she takes medications regularly. She told her PCP about occasional episodes of twitching of her left lip and was referred back to our office. She reports these episodes only last 5 seconds, if they last a minute she may take an extra dose of her medications. They don't occur frequently, she can go months without these episodes. There is no associated arm/leg involvement, no speech changes or  confusion. She denies any olfactory/gustatory hallucinations. She has occasional numbness on her left cheek. She denies any significant headaches, dizziness, diplopia, dysarthria/dysphagia, bowel/bladder dysfunction. She has low back pain. She lives with her husband and children and has not been told of any staring/unresponsive episodes. Sleep is good. No side effects on medications.  Epilepsy Risk Factors:  Right MCA stroke with residual left hemiparesis. Otherwise she had a normal birth and early development.  There is no history of febrile convulsions, CNS infections such as meningitis/encephalitis, significant traumatic brain injury, neurosurgical procedures, or family history of seizures.  No prior EEGs available from where she had prior studies.  MRI brain without contrast done 08/2016 did not show any acute changes. There was extensive post-ischemic gliosis and encephalomalacia in the right hemisphere, most dense in MCA territory but also in distal ACA. Right cerebral hemisphere is asymmetrically atrophic in the right PCA distribution with watershed area infarcts. Poor flow in the bilateral ACA and right MCA branches in the setting of bilateral A1 and right M1 occlusion. Well established lenticulostriate collaterals. Proximal right PCA occlusion with reconstitution by regional collaterals.   PAST MEDICAL HISTORY: Past Medical History:  Diagnosis Date  . Anemia   . Blood transfusion without reported diagnosis   . Migraine 05/2019  . Seizures (HCC)    one time incident, may have been r/t to a pain medication she was prescribed  . Sickle cell disease (HCC)   . Stroke [facility])     PAST SURGICAL HISTORY: Past Surgical History:  Procedure Laterality Date  . CESAREAN SECTION     x4  . CHOLECYSTECTOMY    .  PORTACATH PLACEMENT Right w-3  . reverse tubal ligation    . TEE WITHOUT CARDIOVERSION N/A 09/10/2016   Procedure: TRANSESOPHAGEAL ECHOCARDIOGRAM (TEE);  Surgeon: Thurmon Fair,  MD;  Location: [facility] ENDOSCOPY;  Service: Cardiovascular;  Laterality: N/A;    MEDICATIONS: Current Outpatient Medications on File Prior to Visit  Medication Sig Dispense Refill  . albuterol (VENTOLIN HFA) 108 (90 Base) MCG/ACT inhaler INHALE 2 PUFFS BY MOUTH INTO THE LUNGS EVERY 4 HOURS AS NEEDED FOR WHEEZING OR SHORTNESS OF BREATH AND COUGH 8.5 g 11  . aspirin EC 81 MG tablet Take 1 tablet by mouth every evening.     . folic acid (FOLVITE) 1 MG tablet Take 1 mg by mouth daily.     . hydrOXYzine (ATARAX/VISTARIL) 25 MG tablet Take 0.5-1 tablets (12.5-25 mg total) by mouth every 8 (eight) hours as needed for itching. 60 tablet 6  . lamoTRIgine (LAMICTAL) 200 MG tablet TAKE 1 TABLET (200 MG TOTAL) BY MOUTH 2 TIMES DAILY. 180 tablet 3  . levETIRAcetam (KEPPRA) 1000 MG tablet Take 1 tablet (1,000 mg total) by mouth 2 (two) times daily. 180 tablet 3  . oxyCODONE ER (XTAMPZA ER) 27 MG C12A Take 27 mg by mouth every 12 (twelve) hours. 60 capsule 0  . promethazine (PHENERGAN) 25 MG tablet Take 1 tablet (25 mg total) by mouth every 8 (eight) hours as needed for nausea or vomiting. 1 tablet every 8 hours prn 30 tablet 2  . zolpidem (AMBIEN) 5 MG tablet Take 1 tablet (5 mg total) by mouth at bedtime as needed. for sleep 30 tablet 0   No current facility-administered medications on file prior to visit.    ALLERGIES: Allergies  Allergen Reactions  . Zofran [Ondansetron Hcl] Hives and Itching  . Demerol [Meperidine] Other (See Comments)    Pt has seizures  . Fentanyl And Related Nausea And Vomiting and Other (See Comments)    Very sedated  **Not allergic to the injection** JUST ALLERGIC TO PATCH  . Other Itching    Greek Yogurt  . Trazodone And Nefazodone Other (See Comments)    Patient was dizzy and had stuffy nose   . Codeine Hives and Itching  . Hydrocodone Hives and Itching  . Ibuprofen     FAMILY HISTORY: Family History  Problem Relation Age of Onset  . HIV/AIDS Father     SOCIAL  HISTORY: Social History   Socioeconomic History  . Marital status: Married    Spouse name: Not on file  . Number of children: 4  . Years of education: Not on file  . Highest education level: Not on file  Occupational History  . Occupation: Doesn't work  Tobacco Use  . Smoking status: Never Smoker  . Smokeless tobacco: Never Used  Vaping Use  . Vaping Use: Never used  Substance and Sexual Activity  . Alcohol use: No    Alcohol/week: 0.0 standard drinks    Comment: occasionally  . Drug use: No  . Sexual activity: Yes  Other Topics Concern  . Not on file  Social History Narrative   Right handed    Lives with husband and kids    Social Determinants of Health   Financial Resource Strain: Not on file  Food Insecurity: Not on file  Transportation Needs: Not on file  Physical Activity: Not on file  Stress: Not on file  Social Connections: Not on file  Intimate Partner Violence: Not on file     PHYSICAL EXAM: Vitals:   12/08/20 1028  BP: 109/75  Pulse: 89  SpO2: 96%   General: No acute distress Head:  Normocephalic/atraumatic Skin/Extremities: No rash, no edema. Left foot in AFO Neurological Exam: Mental status: alert and awake, no dysarthria or aphasia, Fund of knowledge is appropriate.  Recent and remote memory are intact.  Attention and concentration are normal.    Cranial nerves: CN I: not tested CN II: pupils equal, round and reactive to light, visual fields intact CN III, IV, VI:  full range of motion, no nystagmus, no ptosis CN V: facial sensation intact CN VII: upper and lower face symmetric CN VIII: hearing intact to conversation Bulk & Tone: normal, no fasciculations. Motor: 5/5 on right UE and LE, spastic contracture of left hand, 4+/5 left proximal UE, 0/5 finger extension. 5/5 proximal left LE, left foot in AFO Sensation: intact to light touch, cold, pin, vibration and joint position sense.  No extinction to double simultaneous stimulation.  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 She denies any convulsions since 2016. She has occasional twitching on the left lip suggestive of focal aware motor seizures. We discussed benefits versus risks (more side effects) with medication changes for simple partial seizures. She was advised to keep a calendar of milder seizures, if they increase in frequency, we can increase doses of Levetiracetam if needed, continue Levetiracetam 1000mg  BID and Lamotrigine 200mg  BID for now. She is aware of [place] driving laws to stop driving after a seizure until 6 months seizure-free. Follow-up after 6 months, she knows to call for any changes.   Thank you for allowing me to participate in the care of this patient. Please do not hesitate to call for any questions or concerns.   , M.D.  CC: , FNP

## 2020-12-09 ENCOUNTER — Encounter: Payer: Self-pay | Admitting: Family Medicine

## 2020-12-14 MED FILL — oxyCODONE HCL 10 MG TABS: 10 | 15 days supply | Qty: 90 | Fill #0

## 2020-12-15 ENCOUNTER — Other Ambulatory Visit: Payer: Self-pay | Admitting: Family Medicine

## 2020-12-15 ENCOUNTER — Telehealth: Payer: Self-pay

## 2020-12-15 DIAGNOSIS — Z79899 Other long term (current) drug therapy: Secondary | ICD-10-CM

## 2020-12-15 DIAGNOSIS — D571 Sickle-cell disease without crisis: Secondary | ICD-10-CM

## 2020-12-15 DIAGNOSIS — F1190 Opioid use, unspecified, uncomplicated: Secondary | ICD-10-CM

## 2020-12-15 DIAGNOSIS — G894 Chronic pain syndrome: Secondary | ICD-10-CM

## 2020-12-15 MED ORDER — XTAMPZA ER 27 MG PO C12A
27.0000 mg | EXTENDED_RELEASE_CAPSULE | Freq: Two times a day (BID) | ORAL | 0 refills | Status: DC
Start: 2020-12-17 — End: 2021-01-17

## 2020-12-15 NOTE — Telephone Encounter (Signed)
Med refill Oxycodone 

## 2020-12-18 ENCOUNTER — Other Ambulatory Visit: Payer: Self-pay | Admitting: *Deleted

## 2020-12-18 ENCOUNTER — Other Ambulatory Visit: Payer: Self-pay

## 2020-12-18 NOTE — Patient Instructions (Signed)
Visit Information  Debra Williamson was given information about Medicaid Managed Care team care coordination services as a part of their [employer] Medicaid benefit. [name] verbally consented to engagement with the [facility] Managed Care team.   For questions related to your Healthy [facility] health plan, please call: [phone] or visit the homepage here: [email or web address]  If you would like to schedule transportation through your Healthy [facility] plan, please call the following number at least 2 days in advance of your appointment: [phone]  Debra Williamson - following are the goals we discussed in your visit today:  Goals Addressed            This Visit's Progress   . Manage Pain-Sickle Cell Disease       Timeframe:  Long-Range Goal Priority:  Medium Start Date:   10/18/20                          Expected End Date:  12/18/20                     - Self administers medications as prescribed - Attends all scheduled provider appointments - Calls pharmacy for medication refills - Performs ADL's independently - Calls provider office for new concerns or questions - develop personal pain management plan - plan exercise or activity when pain is best controlled - track times pain is worst and when it is best - track what makes the pain worse and what makes it better - use ice or heat for pain relief - use relaxation during pain  - work towards weight loss goal of 30 pounds - exercise 30 minutes a day for 5 days a week - maintain a heart healthy meal plan Debra Williamson [facility] for meal planning with vouchers provided by [employer] [phone])   Why is this important?    Day-to-day life can be hard when you have pain.   Pain medicine is just 1 piece of the treatment puzzle.   You can try these action steps to help you manage the pain.            Please see education materials related to diet and exercise provided by MyChart  link.    Calorie Counting for Weight Loss Calories are units of energy. Your body needs a certain number of calories from food to keep going throughout the day. When you eat or drink more calories than your body needs, your body stores the extra calories mostly as fat. When you eat or drink fewer calories than your body needs, your body burns fat to get the energy it needs. Calorie counting means keeping track of how many calories you eat and drink each day. Calorie counting can be helpful if you need to lose weight. If you eat fewer calories than your body needs, you should lose weight. Ask your health care provider what a healthy weight is for you. For calorie counting to work, you will need to eat the right number of calories each day to lose a healthy amount of weight per week. A dietitian can help you figure out how many calories you need in a day and will suggest ways to reach your calorie goal.  A healthy amount of weight to lose each week is usually 1-2 lb (0.5-0.9 kg). This usually means that your daily calorie intake should be reduced by 500-750 calories.  Eating 1,200-1,500 calories a day can help most women lose weight.  Eating 1,500-1,800 calories a day can help most men lose weight. What do I need to know about calorie counting? Work with your health care provider or dietitian to determine how many calories you should get each day. To meet your daily calorie goal, you will need to:  Find out how many calories are in each food that you would like to eat. Try to do this before you eat.  Decide how much of the food you plan to eat.  Keep a food log. Do this by writing down what you ate and how many calories it had. To successfully lose weight, it is important to balance calorie counting with a healthy lifestyle that includes regular activity. Where do I find calorie information? The number of calories in a food can be found on a Nutrition Facts label. If a food does not have a  Nutrition Facts label, try to look up the calories online or ask your dietitian for help. Remember that calories are listed per serving. If you choose to have more than one serving of a food, you will have to multiply the calories per serving by the number of servings you plan to eat. For example, the label on a package of bread might say that a serving size is 1 slice and that there are 90 calories in a serving. If you eat 1 slice, you will have eaten 90 calories. If you eat 2 slices, you will have eaten 180 calories.   How do I keep a food log? After each time that you eat, record the following in your food log as soon as possible:  What you ate. Be sure to include toppings, sauces, and other extras on the food.  How much you ate. This can be measured in cups, ounces, or number of items.  How many calories were in each food and drink.  The total number of calories in the food you ate. Keep your food log near you, such as in a pocket-sized notebook or on an app or website on your mobile phone. Some programs will calculate calories for you and show you how many calories you have left to meet your daily goal. What are some portion-control tips?  Know how many calories are in a serving. This will help you know how many servings you can have of a certain food.  Use a measuring cup to measure serving sizes. You could also try weighing out portions on a kitchen scale. With time, you will be able to estimate serving sizes for some foods.  Take time to put servings of different foods on your favorite plates or in your favorite bowls and cups so you know what a serving looks like.  Try not to eat straight from a food's packaging, such as from a bag or box. Eating straight from the package makes it hard to see how much you are eating and can lead to overeating. Put the amount you would like to eat in a cup or on a plate to make sure you are eating the right portion.  Use smaller plates, glasses, and  bowls for smaller portions and to prevent overeating.  Try not to multitask. For example, avoid watching TV or using your computer while eating. If it is time to eat, sit down at a table and enjoy your food. This will help you recognize when you are full. It will also help you be more mindful of what and how much you are eating. What are tips for following  this plan? Reading food labels  Check the calorie count compared with the serving size. The serving size may be smaller than what you are used to eating.  Check the source of the calories. Try to choose foods that are high in protein, fiber, and vitamins, and low in saturated fat, trans fat, and sodium. Shopping  Read nutrition labels while you shop. This will help you make healthy decisions about which foods to buy.  Pay attention to nutrition labels for low-fat or fat-free foods. These foods sometimes have the same number of calories or more calories than the full-fat versions. They also often have added sugar, starch, or salt to make up for flavor that was removed with the fat.  Make a grocery list of lower-calorie foods and stick to it. Cooking  Try to cook your favorite foods in a healthier way. For example, try baking instead of frying.  Use low-fat dairy products. Meal planning  Use more fruits and vegetables. One-half of your plate should be fruits and vegetables.  Include lean proteins, such as chicken, [place], and fish. Lifestyle Each week, aim to do one of the following:  150 minutes of moderate exercise, such as walking.  75 minutes of vigorous exercise, such as running. General information  Know how many calories are in the foods you eat most often. This will help you calculate calorie counts faster.  Find a way of tracking calories that works for you. Get creative. Try different apps or programs if writing down calories does not work for you. What foods should I eat?  Eat nutritious foods. It is better to have a  nutritious, high-calorie food, such as an avocado, than a food with few nutrients, such as a bag of potato chips.  Use your calories on foods and drinks that will fill you up and will not leave you hungry soon after eating. ? Examples of foods that fill you up are nuts and nut butters, vegetables, lean proteins, and high-fiber foods such as whole grains. High-fiber foods are foods with more than 5 g of fiber per serving.  Pay attention to calories in drinks. Low-calorie drinks include water and unsweetened drinks. The items listed above may not be a complete list of foods and beverages you can eat. Contact a dietitian for more information.   What foods should I limit? Limit foods or drinks that are not good sources of vitamins, minerals, or protein or that are high in unhealthy fats. These include:  Candy.  Other sweets.  Sodas, specialty coffee drinks, alcohol, and juice. The items listed above may not be a complete list of foods and beverages you should avoid. Contact a dietitian for more information. How do I count calories when eating out?  Pay attention to portions. Often, portions are much larger when eating out. Try these tips to keep portions smaller: ? Consider sharing a meal instead of getting your own. ? If you get your own meal, eat only half of it. Before you start eating, ask for a container and put half of your meal into it. ? When available, consider ordering smaller portions from the menu instead of full portions.  Pay attention to your food and drink choices. Knowing the way food is cooked and what is included with the meal can help you eat fewer calories. ? If calories are listed on the menu, choose the lower-calorie options. ? Choose dishes that include vegetables, fruits, whole grains, low-fat dairy products, and lean proteins. ? Choose items that  are boiled, broiled, grilled, or steamed. Avoid items that are buttered, battered, fried, or served with cream sauce. Items  labeled as crispy are usually fried, unless stated otherwise. ? Choose water, low-fat milk, unsweetened iced tea, or other drinks without added sugar. If you want an alcoholic beverage, choose a lower-calorie option, such as a glass of wine or light beer. ? Ask for dressings, sauces, and syrups on the side. These are usually high in calories, so you should limit the amount you eat. ? If you want a salad, choose a garden salad and ask for grilled meats. Avoid extra toppings such as bacon, cheese, or fried items. Ask for the dressing on the side, or ask for olive oil and vinegar or lemon to use as dressing.  Estimate how many servings of a food you are given. Knowing serving sizes will help you be aware of how much food you are eating at restaurants. Where to find more information  Centers for Disease Control and Prevention: http://www.wolf.info/  U.S. Department of Agriculture: http://www.wilson-mendoza.org/ Summary  Calorie counting means keeping track of how many calories you eat and drink each day. If you eat fewer calories than your body needs, you should lose weight.  A healthy amount of weight to lose per week is usually 1-2 lb (0.5-0.9 kg). This usually means reducing your daily calorie intake by 500-750 calories.  The number of calories in a food can be found on a Nutrition Facts label. If a food does not have a Nutrition Facts label, try to look up the calories online or ask your dietitian for help.  Use smaller plates, glasses, and bowls for smaller portions and to prevent overeating.  Use your calories on foods and drinks that will fill you up and not leave you hungry shortly after a meal. This information is not intended to replace advice given to you by your health care provider. Make sure you discuss any questions you have with your health care provider. Document Revised: 11/18/2019 Document Reviewed: 11/18/2019 Elsevier Patient Education  2021 [employer].   [email or web address]">  DASH Eating Plan DASH stands for Dietary Approaches to Stop Hypertension. The DASH eating plan is a healthy eating plan that has been shown to:  Reduce high blood pressure (hypertension).  Reduce your risk for type 2 diabetes, heart disease, and stroke.  Help with weight loss. What are tips for following this plan? Reading food labels  Check food labels for the amount of salt (sodium) per serving. Choose foods with less than 5 percent of the Daily Value of sodium. Generally, foods with less than 300 milligrams (mg) of sodium per serving fit into this eating plan.  To find whole grains, look for the word "whole" as the first word in the ingredient list. Shopping  Buy products labeled as "low-sodium" or "no salt added."  Buy fresh foods. Avoid canned foods and pre-made or frozen meals. Cooking  Avoid adding salt when cooking. Use salt-free seasonings or herbs instead of table salt or sea salt. Check with your health care provider or pharmacist before using salt substitutes.  Do not fry foods. Cook foods using healthy methods such as baking, boiling, grilling, roasting, and broiling instead.  Cook with heart-healthy oils, such as olive, canola, avocado, soybean, or sunflower oil. Meal planning  Eat a balanced diet that includes: ? 4 or more servings of fruits and 4 or more servings of vegetables each day. Try to fill one-half of your plate with fruits and vegetables. ? 6-8  servings of whole grains each day. ? Less than 6 oz (170 g) of lean meat, poultry, or fish each day. A 3-oz (85-g) serving of meat is about the same size as a deck of cards. One egg equals 1 oz (28 g). ? 2-3 servings of low-fat dairy each day. One serving is 1 cup (237 mL). ? 1 serving of nuts, seeds, or beans 5 times each week. ? 2-3 servings of heart-healthy fats. Healthy fats called omega-3 fatty acids are found in foods such as  walnuts, flaxseeds, fortified milks, and eggs. These fats are also found in cold-water fish, such as sardines, salmon, and mackerel.  Limit how much you eat of: ? Canned or prepackaged foods. ? Food that is high in trans fat, such as some fried foods. ? Food that is high in saturated fat, such as fatty meat. ? Desserts and other sweets, sugary drinks, and other foods with added sugar. ? Full-fat dairy products.  Do not salt foods before eating.  Do not eat more than 4 egg yolks a week.  Try to eat at least 2 vegetarian meals a week.  Eat more home-cooked food and less restaurant, buffet, and fast food.   Lifestyle  When eating at a restaurant, ask that your food be prepared with less salt or no salt, if possible.  If you drink alcohol: ? Limit how much you use to:  0-1 drink a day for women who are not pregnant.  0-2 drinks a day for men. ? Be aware of how much alcohol is in your drink. In the [place] one drink equals one 12 oz bottle of beer (355 mL), one 5 oz glass of wine (148 mL), or one 1 oz glass of hard liquor (44 mL). General information  Avoid eating more than 2,300 mg of salt a day. If you have hypertension, you may need to reduce your sodium intake to 1,500 mg a day.  Work with your health care provider to maintain a healthy body weight or to lose weight. Ask what an ideal weight is for you.  Get at least 30 minutes of exercise that causes your heart to beat faster (aerobic exercise) most days of the week. Activities may include walking, swimming, or biking.  Work with your health care provider or dietitian to adjust your eating plan to your individual calorie needs. What foods should I eat? Fruits All fresh, dried, or frozen fruit. Canned fruit in natural juice (without added sugar). Vegetables Fresh or frozen vegetables (raw, steamed, roasted, or grilled). Low-sodium or reduced-sodium tomato and vegetable juice. Low-sodium or reduced-sodium tomato sauce and tomato  paste. Low-sodium or reduced-sodium canned vegetables. Grains Whole-grain or whole-wheat bread. Whole-grain or whole-wheat pasta. Brown rice. [place]. Bulgur. Whole-grain and low-sodium cereals. Pita bread. Low-fat, low-sodium crackers. Whole-wheat flour tortillas. Meats and other proteins Skinless chicken or [place]. Ground chicken or [place]. Pork with fat trimmed off. Fish and seafood. Egg whites. Dried beans, peas, or lentils. Unsalted nuts, nut butters, and seeds. Unsalted canned beans. Lean cuts of beef with fat trimmed off. Low-sodium, lean precooked or cured meat, such as sausages or meat loaves. Dairy Low-fat (1%) or fat-free (skim) milk. Reduced-fat, low-fat, or fat-free cheeses. Nonfat, low-sodium ricotta or cottage cheese. Low-fat or nonfat yogurt. Low-fat, low-sodium cheese. Fats and oils Soft margarine without trans fats. Vegetable oil. Reduced-fat, low-fat, or light mayonnaise and salad dressings (reduced-sodium). Canola, safflower, olive, avocado, soybean, and sunflower oils. Avocado. Seasonings and condiments Herbs. Spices. Seasoning mixes without salt. Other  foods Unsalted popcorn and pretzels. Fat-free sweets. The items listed above may not be a complete list of foods and beverages you can eat. Contact a dietitian for more information. What foods should I avoid? Fruits Canned fruit in a light or heavy syrup. Fried fruit. Fruit in cream or butter sauce. Vegetables Creamed or fried vegetables. Vegetables in a cheese sauce. Regular canned vegetables (not low-sodium or reduced-sodium). Regular canned tomato sauce and paste (not low-sodium or reduced-sodium). Regular tomato and vegetable juice (not low-sodium or reduced-sodium). Angie Fava. Olives. Grains Baked goods made with fat, such as croissants, muffins, or some breads. Dry pasta or rice meal packs. Meats and other proteins Fatty cuts of meat. Ribs. Fried meat. Berniece Salines. Bologna, salami, and other precooked or cured meats,  such as sausages or meat loaves. Fat from the back of a pig (fatback). Bratwurst. Salted nuts and seeds. Canned beans with added salt. Canned or smoked fish. Whole eggs or egg yolks. Chicken or [place] with skin. Dairy Whole or 2% milk, cream, and half-and-half. Whole or full-fat cream cheese. Whole-fat or sweetened yogurt. Full-fat cheese. Nondairy creamers. Whipped toppings. Processed cheese and cheese spreads. Fats and oils Butter. Stick margarine. Lard. Shortening. Ghee. Bacon fat. Tropical oils, such as coconut, palm kernel, or palm oil. Seasonings and condiments Onion salt, garlic salt, seasoned salt, table salt, and sea salt. Worcestershire sauce. Tartar sauce. Barbecue sauce. Teriyaki sauce. Soy sauce, including reduced-sodium. Steak sauce. Canned and packaged gravies. Fish sauce. Oyster sauce. Cocktail sauce. Store-bought horseradish. Ketchup. Mustard. Meat flavorings and tenderizers. Bouillon cubes. Hot sauces. Pre-made or packaged marinades. Pre-made or packaged taco seasonings. Relishes. Regular salad dressings. Other foods Salted popcorn and pretzels. The items listed above may not be a complete list of foods and beverages you should avoid. Contact a dietitian for more information. Where to find more information  National Heart, Lung, and Blood Institute: https://wilson-eaton.com/  American Heart Association: www.heart.org  Academy of Nutrition and Dietetics: www.eatright.[place]: www.kidney.org Summary  The DASH eating plan is a healthy eating plan that has been shown to reduce high blood pressure (hypertension). It may also reduce your risk for type 2 diabetes, heart disease, and stroke.  When on the DASH eating plan, aim to eat more fresh fruits and vegetables, whole grains, lean proteins, low-fat dairy, and heart-healthy fats.  With the DASH eating plan, you should limit salt (sodium) intake to 2,300 mg a day. If you have hypertension, you may need to reduce  your sodium intake to 1,500 mg a day.  Work with your health care provider or dietitian to adjust your eating plan to your individual calorie needs. This information is not intended to replace advice given to you by your health care provider. Make sure you discuss any questions you have with your health care provider. Document Revised: 09/10/2019 Document Reviewed: 09/10/2019 Elsevier Patient Education  2021 [employer].   Exercising to Stay Healthy To become healthy and stay healthy, it is recommended that you do moderate-intensity and vigorous-intensity exercise. You can tell that you are exercising at a moderate intensity if your heart starts beating faster and you start breathing faster but can still hold a conversation. You can tell that you are exercising at a vigorous intensity if you are breathing much harder and faster and cannot hold a conversation while exercising. Exercising regularly is important. It has many health benefits, such as:  Improving overall fitness, flexibility, and endurance.  Increasing bone density.  Helping with weight control.  Decreasing body  fat.  Increasing muscle strength.  Reducing stress and tension.  Improving overall health. How often should I exercise? Choose an activity that you enjoy, and set realistic goals. Your health care provider can help you make an activity plan that works for you. Exercise regularly as told by your health care provider. This may include:  Doing strength training two times a week, such as: ? Lifting weights. ? Using resistance bands. ? Push-ups. ? Sit-ups. ? Yoga.  Doing a certain intensity of exercise for a given amount of time. Choose from these options: ? A total of 150 minutes of moderate-intensity exercise every week. ? A total of 75 minutes of vigorous-intensity exercise every week. ? A mix of moderate-intensity and vigorous-intensity exercise every week. Children, pregnant women, people who have not  exercised regularly, people who are overweight, and older adults may need to talk with a health care provider about what activities are safe to do. If you have a medical condition, be sure to talk with your health care provider before you start a new exercise program. What are some exercise ideas? Moderate-intensity exercise ideas include:  Walking 1 mile (1.6 km) in about 15 minutes.  Biking.  Hiking.  Golfing.  Dancing.  Water aerobics. Vigorous-intensity exercise ideas include:  Walking 4.5 miles (7.2 km) or more in about 1 hour.  Jogging or running 5 miles (8 km) in about 1 hour.  Biking 10 miles (16.1 km) or more in about 1 hour.  Lap swimming.  Roller-skating or in-line skating.  Cross-country skiing.  Vigorous competitive sports, such as football, basketball, and soccer.  Jumping rope.  Aerobic dancing.   What are some everyday activities that can help me to get exercise?  [place] work, such as: ? Pushing a Conservation officer, nature. ? Raking and bagging leaves.  Washing your car.  Pushing a stroller.  Shoveling snow.  Gardening.  Washing windows or floors. How can I be more active in my day-to-day activities?  Use stairs instead of an elevator.  Take a walk during your lunch break.  If you drive, park your car farther away from your work or school.  If you take public transportation, get off one stop early and walk the rest of the way.  Stand up or walk around during all of your indoor phone calls.  Get up, stretch, and walk around every 30 minutes throughout the day.  Enjoy exercise with a friend. Support to continue exercising will help you keep a regular routine of activity. What guidelines can I follow while exercising?  Before you start a new exercise program, talk with your health care provider.  Do not exercise so much that you hurt yourself, feel dizzy, or get very short of breath.  Wear comfortable clothes and wear shoes with good support.  Drink  plenty of water while you exercise to prevent dehydration or heat stroke.  Work out until your breathing and your heartbeat get faster. Where to find more information  U.S. Department of Health and Human Services: BondedCompany.at  Centers for Disease Control and Prevention (CDC): http://www.wolf.info/ Summary  Exercising regularly is important. It will improve your overall fitness, flexibility, and endurance.  Regular exercise also will improve your overall health. It can help you control your weight, reduce stress, and improve your bone density.  Do not exercise so much that you hurt yourself, feel dizzy, or get very short of breath.  Before you start a new exercise program, talk with your health care provider. This information is  not intended to replace advice given to you by your health care provider. Make sure you discuss any questions you have with your health care provider. Document Revised: 09/19/2017 Document Reviewed: 08/28/2017 Elsevier Patient Education  2021 [place].   Patient verbalizes understanding of instructions provided today.   Telephone follow up appointment with Managed Medicaid care management team member scheduled for:02/18/21 @ 10:30am  [name], RN  Following is a copy of your plan of care:  Patient Care Plan: Sickle Cell Disease (Adult)    Problem Identified: Managing Sickle Cell Anemia     Long-Range Goal: Managing Sickle Cell Anemia   Start Date: 10/18/2020  Expected End Date: 12/18/2020  Recent Progress: On track  Priority: Medium  Note:   Current Barriers:  . Chronic Disease Management support and education needs related to managing sickle cell anemia and weight loss -Ms Williamson is currently managing symptoms of sickle cell anemia with medications, routine office visits, hydration, and a healthy diet. She would like to lose weight to assist in the benefits of her symptom management plan. Her last hospitalization was in 2020.-Update-Debra Williamson is  working with [employer] and PCP regarding issues she is having due to recent change in pain medication. Patient is planning to contact PCP today. Debra Williamson is working on weight loss and would like to lose 30#. Marland Kitchen Housing needs- Ms Inscoe has housing, but would like assistance with finding new housing  Nurse Case Manager Clinical Goal(s):  Marland Kitchen Over the next 60 days, patient will meet with RN Care Manager to address progress with weight loss . Over the next 60 days, patient will attend all scheduled medical appointments . Over the next 30 days, patient will work with CM team pharmacist to review medications-Met . Over the next 30 days, patient will work with [place] for assistance with Housing-Met   Interventions:  . Inter-disciplinary care team collaboration (see longitudinal plan of care) . Reviewed medications with patient and discussed taking Oxycodone ER twice a day for pain management and taking Oxycodone 10 mg as needed for increased pain . Discussed plans with patient for ongoing care management follow up and provided patient with direct contact information for care management team . Provided patient with mychart educational materials related to healthy diet and exercise . Care Guide referral for assistance with housing . Pharmacy referral for medication review . Provided number to [facility] [phone]) for [employer] vouchers . Reviewed scheduled appointments  Patient Goals/Self-Care Activities Over the next 60 days, patient will:  -Self administers medications as prescribed - Attends all scheduled provider appointments - Calls pharmacy for medication refills - Performs ADL's independently - Calls provider office for new concerns or questions - develop personal pain management plan - plan exercise or activity when pain is best controlled - track times pain is worst and when it is best - track what makes the pain worse and what makes it better - use ice or heat for pain  relief - use relaxation during pain - work towards weight loss goal of 30 pounds - exercise 30 minutes a day for 5 days a week - maintain a heart healthy meal plan Debra Williamson [facility] for meal planning with vouchers provided by [employer] [phone])  Follow Up Plan: Telephone follow up appointment with Managed Medicaid care management team member scheduled for:02/18/21 @ 10:30am

## 2020-12-18 NOTE — Patient Outreach (Signed)
Medicaid Managed Care   Nurse Care Manager Note  12/18/2020 Name:  Debra Williamson MRN:  [id number] DOB:  1981/10/16  Debra Williamson is an 40 y.o. year old female who is a primary patient of Debra Glatter, FNP.  [facility] Managed Care Coordination team was consulted for assistance with:    Sickle Cell Anemia  Debra Williamson was given information about Medicaid Managed Care Coordination team services today. Debra Williamson agreed to services and verbal consent obtained.  Engaged with patient by telephone for follow up visit in response to provider referral for case management and/or care coordination services.   Assessments/Interventions:  Review of past medical history, allergies, medications, health status, including review of consultants reports, laboratory and other test data, was performed as part of comprehensive evaluation and provision of chronic care management services.  SDOH (Social Determinants of Health) assessments and interventions performed:   Care Plan  Allergies  Allergen Reactions  . Zofran [Ondansetron Hcl] Hives and Itching  . Demerol [Meperidine] Other (See Comments)    Pt has seizures  . Fentanyl And Related Nausea And Vomiting and Other (See Comments)    Very sedated  **Not allergic to the injection** JUST ALLERGIC TO PATCH  . Other Itching    Greek Yogurt  . Trazodone And Nefazodone Other (See Comments)    Patient was dizzy and had stuffy nose   . Codeine Hives and Itching  . Hydrocodone Hives and Itching  . Ibuprofen     Medications Reviewed Today    Reviewed by Debra Glatter, FNP (Family Nurse Practitioner) on 12/09/20 at 2114  Med List Status: <None>  Medication Order Taking? Sig Documenting Provider Last Dose Status Informant  albuterol (VENTOLIN HFA) 108 (90 Base) MCG/ACT inhaler [id number] Yes INHALE 2 PUFFS BY MOUTH INTO THE LUNGS EVERY 4 HOURS AS NEEDED FOR WHEEZING OR SHORTNESS OF BREATH AND COUGH Debra Glatter, FNP Taking  Active            Med Note Debra Williamson, Debra Williamson   Tue Oct 24, 2020  1:51 PM) Taking PRN, 2x/week  aspirin EC 81 MG tablet [id number] Yes Take 1 tablet by mouth every evening.  [provider] Taking Active Self           Med Note Debra Williamson   Sat Mar 11, 2015  7:32 PM) .   folic acid (FOLVITE) 1 MG tablet [id number] Yes Take 1 mg by mouth daily.  [provider] Taking Active Self           Med Note Debra Williamson   Tue Dec 05, 2020  8:36 AM)    hydrOXYzine (ATARAX/VISTARIL) 25 MG tablet [id number] Yes Take 0.5-1 tablets (12.5-25 mg total) by mouth every 8 (eight) hours as needed for itching. Debra Glatter, FNP Taking Active   lamoTRIgine (LAMICTAL) 200 MG tablet [id number]  TAKE 1 TABLET (200 MG TOTAL) BY MOUTH 2 TIMES DAILY. Debra Sprang, MD  Active   levETIRAcetam (KEPPRA) 1000 MG tablet [id number]  Take 1 tablet (1,000 mg total) by mouth 2 (two) times daily. Debra Sprang, MD  Active   oxyCODONE ER [facility]) 27 MG C12A [id number] No Take 27 mg by mouth every 12 (twelve) hours. Debra Glatter, FNP Not Taking Active   promethazine (PHENERGAN) 25 MG tablet [id number] Yes Take 1 tablet (25 mg total) by mouth every 8 (eight) hours as needed for nausea or vomiting. 1 tablet every 8 hours prn Debra Glatter, FNP  Taking Active            Med Note Debra Williamson   Tue Dec 05, 2020  9:33 AM) As needed  zolpidem (AMBIEN) 5 MG tablet [id number] No Take 1 tablet (5 mg total) by mouth at bedtime as needed. for sleep Debra Glatter, FNP Not Taking Active           Patient Active Problem List   Diagnosis Date Noted  . Weakness of lower extremity 02/27/2020  . Insomnia 08/24/2019  . Increased storage iron 08/24/2019  . Nausea 07/02/2019  . Chronic pain syndrome   . Sickle cell crisis ([place]) 11/26/2018  . Chest pain 11/25/2018  . Vaso-occlusive sickle cell crisis ([place]) 11/25/2018  . Sickle cell pain crisis ([place]) 11/24/2018  . Chronic, continuous use of  opioids 10/27/2017  . 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 TIA (transient ischemic attack)   . Vertigo 09/07/2016  . Dyspnea 09/07/2016  . Lightheadedness 09/06/2016  . Healthcare maintenance 07/30/2016  . Dizziness, nonspecific 07/30/2016  . Left foot drop 07/30/2016  . Left-sided weakness 07/30/2016  . 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 History of stroke associated with blood clotting tendency 01/02/2016  . [place] disease ([place]) 10/27/2015  . 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 Acute chest syndrome ([place])   . Acute respiratory failure with hypoxemia ([place])   . Community acquired pneumonia 08/08/2015  . Hemiparesis following cerebrovascular accident (CVA) ([place]) 05/30/2015  . Ankle gives out 05/30/2015  . Seizure disorder ([place]) 05/30/2015  . 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 H/O: stroke 03/12/2015  . Seizures ([place]) 03/12/2015  . Sickle cell disease ([place]) 03/11/2015  . Symptomatic anemia 03/11/2015    Conditions to be addressed/monitored per PCP order:  sickle cell anemia  Care Plan : Sickle Cell Disease (Adult)  Updates made by Debra Montane, RN since 12/18/2020 12:00 AM    Problem: Managing Sickle Cell Anemia     Long-Range Goal: Managing Sickle Cell Anemia   Start Date: 10/18/2020  Expected End Date: 12/18/2020  Recent Progress: On track  Priority: Medium  Note:   Current Barriers:  . Chronic Disease Management support and education needs related to managing sickle cell anemia and weight loss -Ms Yokley is currently managing symptoms of sickle cell anemia with medications, routine office visits, hydration, and a healthy diet. She would like to lose weight to assist in the benefits of her symptom management plan. Her last hospitalization was in 2020.-Update-Ms. Soliday is working with [employer] and PCP regarding issues she is having due to recent change in pain medication. Patient is planning to contact PCP today. Ms. Char is working on weight loss and would like to lose 30#. Marland Kitchen Housing needs- Ms Clute has housing, but would like assistance with finding new housing  Nurse Case Manager Clinical Goal(s):  Marland Kitchen Over the next 60 days, patient will meet with RN Care Manager to address progress with weight loss . Over the next 60 days, patient will attend all scheduled medical appointments . Over the next 30 days, patient will work with CM team pharmacist to review medications-Met . Over the next 30 days, patient will work with [place] for assistance with Housing-Met   Interventions:  . Inter-disciplinary care team collaboration (see longitudinal plan of care) . Reviewed medications with patient and discussed taking Oxycodone ER twice a day  for pain management and taking Oxycodone 10 mg as needed for increased pain . Discussed plans with patient for ongoing care management follow up and provided patient with direct contact information for care management team . Provided patient with mychart educational materials related to healthy diet and exercise . Care Guide referral for assistance with housing . Pharmacy referral for medication review . Provided number to [facility] [phone]) for [employer] vouchers . Reviewed scheduled appointments  Patient Goals/Self-Care Activities Over the next 60 days, patient will:  -Self administers medications as prescribed - Attends all scheduled provider appointments - Calls pharmacy for medication refills - Performs ADL's independently - Calls provider office for new concerns or questions - develop personal pain management plan - plan exercise or activity when pain is best controlled - track times pain is worst and when it is best - track what makes the pain worse and what makes it better - use ice or heat for pain relief - use relaxation  during pain - work towards weight loss goal of 30 pounds - exercise 30 minutes a day for 5 days a week - maintain a heart healthy meal plan [facility] for meal planning with vouchers provided by [employer] [phone])  Follow Up Plan: Telephone follow up appointment with Managed Medicaid care management team member scheduled for:02/18/21 @ 10:30am        Follow Up:  Patient agrees to Care Plan and Follow-up.  Plan: The Managed Medicaid care management team will reach out to the patient again over the next 60 days.  Date/time of next scheduled RN care management/care coordination outreach: 02/18/21 @ 10:30am  Lurena Joiner RN, [place] RN Care Coordinator

## 2020-12-21 ENCOUNTER — Encounter: Payer: Self-pay | Admitting: Family Medicine

## 2020-12-22 ENCOUNTER — Other Ambulatory Visit: Payer: Self-pay

## 2020-12-22 ENCOUNTER — Other Ambulatory Visit: Payer: Self-pay | Admitting: Family Medicine

## 2020-12-22 DIAGNOSIS — F1190 Opioid use, unspecified, uncomplicated: Secondary | ICD-10-CM

## 2020-12-22 DIAGNOSIS — D571 Sickle-cell disease without crisis: Secondary | ICD-10-CM

## 2020-12-22 DIAGNOSIS — Z79899 Other long term (current) drug therapy: Secondary | ICD-10-CM

## 2020-12-22 DIAGNOSIS — G894 Chronic pain syndrome: Secondary | ICD-10-CM

## 2020-12-22 MED ORDER — OXYCODONE HCL 10 MG PO TABS: 10.0000 mg | ORAL_TABLET | ORAL | 0 refills | Status: DC | PRN

## 2020-12-22 MED FILL — XTAMPZA ER 27 MG CAPSULE: 27 | 30 days supply | Qty: 60 | Fill #0

## 2020-12-22 NOTE — Patient Instructions (Signed)
Visit Information  Ms. Ripley was given information about Medicaid Managed Care team care coordination services as a part of their [employer] Medicaid benefit. [name] verbally consented to engagement with the [facility] Managed Care team.   For questions related to your Healthy [facility] health plan, please call: [phone] or visit the homepage here: [email or web address]  If you would like to schedule transportation through your Healthy [facility] plan, please call the following number at least 2 days in advance of your appointment: [phone]  Ms. Ureste - following are the goals we discussed in your visit today:  Goals Addressed            This Visit's Progress   . Manage My Medicine       Timeframe:  Short-Term Goal Priority:  High Start Date:                             Expected End Date:                       Follow Up Date 3 Months   - call for medicine refill 2 or 3 days before it runs out    Why is this important?   . These steps will help you keep on track with your medicines.   Notes: Patient had trouble getting PA from Pharmacy. My Chart text clearly states for patient to contact the Pharmacy to have them fax PA to office. Patient didn't do so       Please see education materials related to Pain provided as print materials.   Patient verbalizes understanding of instructions provided today.   The patient has been provided with contact information for the Managed Medicaid care management team and has been advised to call with any health related questions or concerns.   Lane Hacker, [facility]  Following is a copy of your plan of care:  Patient Care Plan: Sickle Cell Disease (Adult)    Problem Identified: Managing Sickle Cell Anemia     Long-Range Goal: Managing Sickle Cell Anemia   Start Date: 10/18/2020  Expected End Date: 12/18/2020  Recent Progress: On track  Priority: Medium  Note:   Current  Barriers:  . Chronic Disease Management support and education needs related to managing sickle cell anemia and weight loss -Ms Austria is currently managing symptoms of sickle cell anemia with medications, routine office visits, hydration, and a healthy diet. She would like to lose weight to assist in the benefits of her symptom management plan. Her last hospitalization was in 2020.-Update-Ms. Teater is working with [employer] and PCP regarding issues she is having due to recent change in pain medication. Patient is planning to contact PCP today. Ms. Carles is working on weight loss and would like to lose 30#. Marland Kitchen Housing needs- Ms Feltner has housing, but would like assistance with finding new housing  Nurse Case Manager Clinical Goal(s):  Marland Kitchen Over the next 60 days, patient will meet with RN Care Manager to address progress with weight loss . Over the next 60 days, patient will attend all scheduled medical appointments . Over the next 30 days, patient will work with CM team pharmacist to review medications-Met . Over the next 30 days, patient will work with [place] for assistance with Housing-Met   Interventions:  . Inter-disciplinary care team collaboration (see longitudinal plan of care) . Reviewed medications with patient and discussed taking Oxycodone ER twice  a day for pain management and taking Oxycodone 10 mg as needed for increased pain . Discussed plans with patient for ongoing care management follow up and provided patient with direct contact information for care management team . Provided patient with mychart educational materials related to healthy diet and exercise . Care Guide referral for assistance with housing . Pharmacy referral for medication review . Provided number to [facility] [phone]) for [employer] vouchers . Reviewed scheduled appointments  Patient Goals/Self-Care Activities Over the next 60 days, patient will:  -Self administers medications as prescribed -  Attends all scheduled provider appointments - Calls pharmacy for medication refills - Performs ADL's independently - Calls provider office for new concerns or questions - develop personal pain management plan - plan exercise or activity when pain is best controlled - track times pain is worst and when it is best - track what makes the pain worse and what makes it better - use ice or heat for pain relief - use relaxation during pain - work towards weight loss goal of 30 pounds - exercise 30 minutes a day for 5 days a week - maintain a heart healthy meal plan - Utilize [employer] for meal planning with vouchers provided by [employer] [phone])  Follow Up Plan: Telephone follow up appointment with Managed Medicaid care management team member scheduled for:02/18/21 @ 10:30am      Patient Care Plan: Medication Management    Problem Identified: Health Promotion or Disease Self-Management (General Plan of Care)     Goal: Medication Management   Note:   Current Barriers:  . Does not maintain contact with provider office . Does not contact provider office for questions/concerns .   Pharmacist Clinical Goal(s):  Marland Kitchen Over the next 90 days, patient will contact provider office for questions/concerns as evidenced notation of same in electronic health record through collaboration with PharmD and provider.  .   Interventions: . Inter-disciplinary care team collaboration (see longitudinal plan of care) . Comprehensive medication review performed; medication list updated in electronic medical record  Chronic Pain  Patient Goals/Self-Care Activities . Over the next 90 days, patient will:  - collaborate with provider on medication access solutions  Follow Up Plan: The patient has been provided with contact information for the care management team and has been advised to call with any health related questions or concerns.     Task: Mutually Develop and Royce Macadamia Achievement of Patient Goals    Note:   Care Management Activities:    - verbalization of feelings encouraged    Notes:

## 2020-12-22 NOTE — Patient Outreach (Signed)
Medicaid Managed Care    Pharmacy Note  12/22/2020 Name: Debra Williamson MRN: [id number] DOB: 1981-06-06  Debra Williamson is a 40 y.o. year old female who is a primary care patient of Kallie Locks, FNP. The [facility] Managed Care Coordination team was consulted for assistance with disease management and care coordination needs.    Engaged with patient Engaged with patient by telephone for follow up visit in response to referral for case management and/or care coordination services.  Debra Williamson was given information about Managed Medicaid Care Coordination team services today. Debra Williamson agreed to services and verbal consent obtained.   Objective:  Lab Results  Component Value Date   CREATININE 0.67 11/30/2018   CREATININE 0.43 (L) 11/26/2018   CREATININE 0.52 11/25/2018    Lab Results  Component Value Date   HGBA1C <4.2 (L) 09/07/2016       Component Value Date/Time   CHOL 128 09/07/2016 0337   TRIG 211 (H) 09/07/2016 0337   HDL 26 (L) 09/07/2016 0337   CHOLHDL 4.9 09/07/2016 0337   VLDL 42 (H) 09/07/2016 0337   LDLCALC 60 09/07/2016 0337    Other: (TSH, CBC, Vit D, etc.)  Clinical ASCVD: No  The ASCVD Risk score Denman George DC Jr., et al., 2013) failed to calculate for the following reasons:   Cannot find a previous HDL lab   Cannot find a previous total cholesterol lab    Other: (CHADS2VASc if Afib, PHQ9 if depression, MMRC or CAT for COPD, ACT, DEXA)  BP Readings from Last 3 Encounters:  12/08/20 109/75  12/05/20 117/63  10/06/20 (!) 119/58    Assessment/Interventions: Review of patient past medical history, allergies, medications, health status, including review of consultants reports, laboratory and other test data, was performed as part of comprehensive evaluation and provision of chronic care management services.    Sickle Cell Disease Managed by Hematologist Folic Acid 1mg  QD Plan: At goal,  patient stable/ symptoms controlled   Chronic  Pain Pain scale: 10/25/19: Didn't state scale but patient said medications aren't working at all and her new one takes 4 hours to kick in Oxycodone 10mg  IR Xtampza (Oxycodone 27 ER) (Takes too long to work) -Wasn't sure why she was taking multiple forms and why it didn't work Jan 2022 Plan: Explained difference between ER and IR and possible strategies to improve relief March 2022: Patient got new script for pain meds but required a PA. Per MyChart msg from 12/21/20, PCP asked patient to contact Rx to get them to fax it over again. The patient did not so after my visit with the patient I called Rx and they Faxed, CoverMyMeds, and electronically sent it for a PA  Seizures Lamotrigine 200mg  QD Levetiracetam 1000mg  BID Plan: At goal,  patient stable/ symptoms controlled   Insomnia Zolpidem 5mg  HS Tried/failed: Trazodone (Stuffy nose and dizzy) Plan: At goal,  patient stable/ symptoms controlled    Medications -Doesn't organize medications -Keeps in backpack -Outpatient at [facility] (Husband works at hospital and picks them up)   SDOH (Social Determinants of Health) assessments and interventions performed:    Care Plan  Allergies  Allergen Reactions  . Zofran [Ondansetron Hcl] Hives and Itching  . Demerol [Meperidine] Other (See Comments)    Pt has seizures  . Fentanyl And Related Nausea And Vomiting and Other (See Comments)    Very sedated  **Not allergic to the injection** JUST ALLERGIC TO PATCH  . Other Itching    Greek Yogurt  . Trazodone And  Nefazodone Other (See Comments)    Patient was dizzy and had stuffy nose   . Codeine Hives and Itching  . Hydrocodone Hives and Itching  . Ibuprofen     Medications Reviewed Today    Reviewed by Kallie Locks, FNP (Family Nurse Practitioner) on 12/09/20 at 2114  Med List Status: <None>  Medication Order Taking? Sig Documenting Provider Last Dose Status Informant  albuterol (VENTOLIN HFA) 108 (90 Base) MCG/ACT inhaler  [id number] Yes INHALE 2 PUFFS BY MOUTH INTO THE LUNGS EVERY 4 HOURS AS NEEDED FOR WHEEZING OR SHORTNESS OF BREATH AND COUGH Kallie Locks, FNP Taking Active            Med Note Kyung Rudd, Janice Coffin   Tue Oct 24, 2020  1:51 PM) Taking PRN, 2x/week  aspirin EC 81 MG tablet [id number] Yes Take 1 tablet by mouth every evening.  [provider] Taking Active Self           Med Note Bunnie Philips   Sat Mar 11, 2015  7:32 PM) .   folic acid (FOLVITE) 1 MG tablet [id number] Yes Take 1 mg by mouth daily.  [provider] Taking Active Self           Med Note Judd Gaudier   Tue Dec 05, 2020  8:36 AM)    hydrOXYzine (ATARAX/VISTARIL) 25 MG tablet [id number] Yes Take 0.5-1 tablets (12.5-25 mg total) by mouth every 8 (eight) hours as needed for itching. Kallie Locks, FNP Taking Active   lamoTRIgine (LAMICTAL) 200 MG tablet [id number]  TAKE 1 TABLET (200 MG TOTAL) BY MOUTH 2 TIMES DAILY. Van Clines, MD  Active   levETIRAcetam (KEPPRA) 1000 MG tablet [id number]  Take 1 tablet (1,000 mg total) by mouth 2 (two) times daily. Van Clines, MD  Active   oxyCODONE ER [place] ER) 27 MG C12A [id number] No Take 27 mg by mouth every 12 (twelve) hours. Kallie Locks, FNP Not Taking Active   promethazine (PHENERGAN) 25 MG tablet [id number] Yes Take 1 tablet (25 mg total) by mouth every 8 (eight) hours as needed for nausea or vomiting. 1 tablet every 8 hours prn Kallie Locks, FNP Taking Active            Med Note Kallie Locks   Tue Dec 05, 2020  9:33 AM) As needed  zolpidem (AMBIEN) 5 MG tablet [id number] No Take 1 tablet (5 mg total) by mouth at bedtime as needed. for sleep Kallie Locks, FNP Not Taking Active           Patient Active Problem List   Diagnosis Date Noted  . Weakness of lower extremity 02/27/2020  . Insomnia 08/24/2019  . Increased storage iron 08/24/2019  . Nausea 07/02/2019  . Chronic pain syndrome   . Sickle cell crisis (HCC) 11/26/2018  .  Chest pain 11/25/2018  . Vaso-occlusive sickle cell crisis (HCC) 11/25/2018  . Sickle cell pain crisis (HCC) 11/24/2018  . Chronic, continuous use of opioids 10/27/2017  . 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 TIA (transient ischemic attack)   . Vertigo 09/07/2016  . Dyspnea 09/07/2016  . Lightheadedness 09/06/2016  . Healthcare maintenance 07/30/2016  . Dizziness, nonspecific 07/30/2016  . Left foot drop 07/30/2016  . Left-sided weakness 07/30/2016  . 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 History of stroke associated with blood clotting tendency 01/02/2016  .  [place] disease (HCC) 10/27/2015  . Hb-SS disease with acute chest syndrome (HCC) 08/14/2015  . Acute chest syndrome (HCC)   . Acute respiratory failure with hypoxemia (HCC)   . Community acquired pneumonia 08/08/2015  . Hemiparesis following cerebrovascular accident (CVA) (HCC) 05/30/2015  . Ankle gives out 05/30/2015  . Seizure disorder (HCC) 05/30/2015  . 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 H/O: stroke 03/12/2015  . Seizures (HCC) 03/12/2015  . Sickle cell disease (HCC) 03/11/2015  . Symptomatic anemia 03/11/2015    Conditions to be addressed/monitored: Chronic Pain  Care Plan : Medication Management  Updates made by Zettie Pho, RPH since 12/22/2020 12:00 AM    Problem: Health Promotion or Disease Self-Management (General Plan of Care)     Goal: Medication Management   Note:   Current Barriers:  . Does not maintain contact with provider office . Does not contact provider office for questions/concerns .   Pharmacist Clinical Goal(s):  Marland Kitchen Over the next 90 days, patient will contact provider office for questions/concerns as evidenced notation of same in electronic health record through collaboration with PharmD and provider.   .   Interventions: . Inter-disciplinary care team collaboration (see longitudinal plan of care) . Comprehensive medication review performed; medication list updated in electronic medical record  Chronic Pain  Patient Goals/Self-Care Activities . Over the next 90 days, patient will:  - collaborate with provider on medication access solutions  Follow Up Plan: The patient has been provided with contact information for the care management team and has been advised to call with any health related questions or concerns.     Task: Mutually Develop and Malen Gauze Achievement of Patient Goals   Note:   Care Management Activities:    - verbalization of feelings encouraged    Notes:      Medication Assistance: Contacted Rx for PA fax  Follow up: Agree   Plan: The patient has been provided with contact information for the care management team and has been advised to call with any health related questions or concerns.   Artelia Laroche, Pharm.D., Managed Medicaid Pharmacist - [phone]

## 2020-12-28 DIAGNOSIS — D571 Sickle-cell disease without crisis: Secondary | ICD-10-CM | POA: Diagnosis not present

## 2020-12-29 ENCOUNTER — Telehealth: Payer: Self-pay

## 2020-12-29 NOTE — Progress Notes (Signed)
[employer] [place] [employer] has not yet replied to your PA request. You may close this dialog, return to your dashboard, and perform other tasks.  To check for an update later, open this request again from your dashboard.  If [employer] [place] Medicaid has not replied to your request within 24 hours please contact [employer]  Medicaid at [phone]. 

## 2020-12-29 NOTE — Telephone Encounter (Signed)
Asking about her 10 mg medications  "pre auth ? Pt is asking on if it's been done.

## 2020-12-29 NOTE — Progress Notes (Signed)
Key: [id number] - PA Case ID: [id number] Need help? Call us at [phone] Status Sent to [facility] oxyCODONE HCl 10MG  tablets Form [facility] Electronic [facility] Form [phone] NCPDP)

## 2020-12-30 MED FILL — oxyCODONE HCL 10 MG TABS: 10 | 15 days supply | Qty: 90 | Fill #0

## 2021-01-01 ENCOUNTER — Telehealth: Payer: Self-pay

## 2021-01-01 NOTE — Telephone Encounter (Signed)
Received approved for oxycodone 10mg   Qty 90 for 12/29/20-06/27/21.  Pa# 08/27/21. Per  [employer]   Copy scan in chart

## 2021-01-08 DIAGNOSIS — D571 Sickle-cell disease without crisis: Secondary | ICD-10-CM | POA: Diagnosis not present

## 2021-01-15 ENCOUNTER — Telehealth: Payer: Self-pay | Admitting: Family Medicine

## 2021-01-17 ENCOUNTER — Other Ambulatory Visit: Payer: Self-pay | Admitting: Family Medicine

## 2021-01-17 DIAGNOSIS — D571 Sickle-cell disease without crisis: Secondary | ICD-10-CM

## 2021-01-17 DIAGNOSIS — Z79899 Other long term (current) drug therapy: Secondary | ICD-10-CM

## 2021-01-17 DIAGNOSIS — F1190 Opioid use, unspecified, uncomplicated: Secondary | ICD-10-CM

## 2021-01-17 DIAGNOSIS — G894 Chronic pain syndrome: Secondary | ICD-10-CM

## 2021-01-17 MED ORDER — OXYCODONE HCL 10 MG PO TABS: 10.0000 mg | ORAL_TABLET | ORAL | 0 refills | Status: DC | PRN

## 2021-01-17 MED ORDER — XTAMPZA ER 27 MG PO C12A: 27.0000 mg | EXTENDED_RELEASE_CAPSULE | Freq: Two times a day (BID) | ORAL | 0 refills | Status: AC

## 2021-01-17 MED FILL — oxyCODONE HCL 10 MG TABS: 10 | 15 days supply | Qty: 90 | Fill #0

## 2021-01-17 NOTE — Telephone Encounter (Signed)
Sent to provider 

## 2021-01-19 MED FILL — PROAIR HFA 90 MCG INHALER: 108 (90 BAS | 17 days supply | Qty: 9 | Fill #2

## 2021-01-19 MED FILL — LIDOCAINE-PRILOCAINE CREAM: 2.5-2.5 | 14 days supply | Qty: 30 | Fill #3

## 2021-01-23 DIAGNOSIS — D571 Sickle-cell disease without crisis: Secondary | ICD-10-CM | POA: Diagnosis not present

## 2021-01-24 ENCOUNTER — Telehealth: Payer: Self-pay | Admitting: Neurology

## 2021-01-24 ENCOUNTER — Encounter: Payer: Self-pay | Admitting: Neurology

## 2021-01-24 DIAGNOSIS — R569 Unspecified convulsions: Secondary | ICD-10-CM

## 2021-01-24 NOTE — Telephone Encounter (Signed)
Patient states at her last visit Dr Karel Jarvis spoke with her about increasing her medications. She is calling back to request the increase of her Keppra and Lamictal. She can be called on her cell number, or home number [phone]

## 2021-01-25 ENCOUNTER — Other Ambulatory Visit: Payer: Self-pay

## 2021-01-25 ENCOUNTER — Other Ambulatory Visit (HOSPITAL_COMMUNITY): Payer: Self-pay

## 2021-01-25 ENCOUNTER — Telehealth: Payer: Self-pay | Admitting: Neurology

## 2021-01-25 DIAGNOSIS — R569 Unspecified convulsions: Secondary | ICD-10-CM

## 2021-01-25 DIAGNOSIS — D571 Sickle-cell disease without crisis: Secondary | ICD-10-CM | POA: Diagnosis not present

## 2021-01-25 MED ORDER — LEVETIRACETAM 1000 MG PO TABS
ORAL_TABLET | ORAL | 3 refills | Status: DC
  Filled 2021-01-25: qty 270, 90d supply, fill #0

## 2021-01-25 MED ORDER — LEVETIRACETAM 1000 MG PO TABS
ORAL_TABLET | ORAL | 3 refills | Status: AC
  Filled 2021-01-25: qty 270, 90d supply, fill #0
  Filled 2021-05-18: qty 270, 90d supply, fill #1

## 2021-01-25 MED FILL — Lamotrigine Tab 200 MG: ORAL | 90 days supply | Qty: 180 | Fill #0 | Status: CN

## 2021-01-25 NOTE — Telephone Encounter (Signed)
Left a message stating that she spoke to someone not too long ago and the pharmacy still does not have anything from [place] please call   She did not leave the name of the medication

## 2021-01-25 NOTE — Telephone Encounter (Signed)
Called patient and informed her that per Dr. Karel Jarvis she is on a pretty good dose of Lamictal and that Dr. Karel Jarvis would increase the Keppra. Informed patient to increase Keppra 1000mg : Take 1 and 1/2 tabs BID. And that upgraded rx was sent to .

## 2021-01-25 NOTE — Telephone Encounter (Signed)
Called Debra Williamson outpatient pharmacy and asked if RX Keppra was received. Pharmacist stated that they have not received it and to go ahead and resend since it has been a couple hours since it was sent.

## 2021-01-25 NOTE — Telephone Encounter (Signed)
She is on a pretty good dose of Lamictal. I would increase the Keppra. Pls let her know to increase Keppra 1000mg : Take 1 and 1/2 tabs BID. I sent in updated Rx, thanks

## 2021-01-25 NOTE — Telephone Encounter (Signed)
Called patient and informed her that rx was resent. Advised patient that if they don't receive it this time we may have print rx and fax. Patient will give [place] a call if she has any issues.

## 2021-01-26 ENCOUNTER — Other Ambulatory Visit (HOSPITAL_COMMUNITY): Payer: Self-pay

## 2021-01-26 ENCOUNTER — Telehealth: Payer: Self-pay | Admitting: Neurology

## 2021-01-26 MED FILL — Oxycodone Cap ER 12HR Abuse-Deterrent 27 MG: ORAL | 30 days supply | Qty: 60 | Fill #0 | Status: AC

## 2021-01-29 ENCOUNTER — Other Ambulatory Visit (HOSPITAL_COMMUNITY): Payer: Self-pay

## 2021-01-30 ENCOUNTER — Telehealth: Payer: Self-pay | Admitting: Neurology

## 2021-01-30 NOTE — Telephone Encounter (Signed)
Patient called in stating she has some questions on how she should be taking her Keppra and lamictal.

## 2021-01-30 NOTE — Telephone Encounter (Signed)
Advised pt, Spoke to [facility], Pt is to take the increase Keppra 1000mg : Take 1 and 1/2 tabs BID as well as the Lamictal 200 mg BID

## 2021-01-31 ENCOUNTER — Ambulatory Visit: Payer: Medicaid Other | Admitting: Family Medicine

## 2021-02-02 ENCOUNTER — Ambulatory Visit: Payer: Medicaid Other | Admitting: Family Medicine

## 2021-02-07 ENCOUNTER — Ambulatory Visit: Payer: Medicaid Other | Admitting: Nurse Practitioner

## 2021-02-14 ENCOUNTER — Other Ambulatory Visit (HOSPITAL_COMMUNITY): Payer: Self-pay

## 2021-02-15 ENCOUNTER — Other Ambulatory Visit: Payer: Self-pay | Admitting: Nurse Practitioner

## 2021-02-15 ENCOUNTER — Other Ambulatory Visit (HOSPITAL_COMMUNITY): Payer: Self-pay

## 2021-02-15 ENCOUNTER — Other Ambulatory Visit: Payer: Self-pay | Admitting: Family Medicine

## 2021-02-15 ENCOUNTER — Telehealth: Payer: Self-pay

## 2021-02-15 DIAGNOSIS — Z79899 Other long term (current) drug therapy: Secondary | ICD-10-CM

## 2021-02-15 DIAGNOSIS — F1190 Opioid use, unspecified, uncomplicated: Secondary | ICD-10-CM

## 2021-02-15 DIAGNOSIS — D571 Sickle-cell disease without crisis: Secondary | ICD-10-CM

## 2021-02-15 DIAGNOSIS — G894 Chronic pain syndrome: Secondary | ICD-10-CM

## 2021-02-15 MED ORDER — OXYCODONE HCL 10 MG PO TABS
ORAL_TABLET | ORAL | 0 refills | Status: DC
Start: 2021-02-15 — End: 2021-03-15
  Filled 2021-02-15: qty 90, 15d supply, fill #0

## 2021-02-15 NOTE — Progress Notes (Signed)
Reviewed PDMP substance reporting system prior to prescribing opiate medications. No inconsistencies noted.   Meds ordered this encounter  Medications  . Oxycodone HCl 10 MG TABS    Sig: TAKE 1 TABLET BY MOUTH EVERY 4 HOURS AS NEEDED FOR UP TO 15 DAYS FOR PAIN    Dispense:  90 tablet    Refill:  0    Order Specific Question:   Supervising Provider    Answer:   Quentin Angst [[id number]]     Nolon Nations  APRN, MSN, FNP-C Patient Care [facility] [address] [place], [place] [address] [phone]

## 2021-02-15 NOTE — Telephone Encounter (Signed)
Med refill   Oxycodone 10 mg  

## 2021-02-16 ENCOUNTER — Other Ambulatory Visit (HOSPITAL_COMMUNITY): Payer: Self-pay

## 2021-02-19 ENCOUNTER — Other Ambulatory Visit: Payer: Self-pay | Admitting: *Deleted

## 2021-02-19 NOTE — Patient Instructions (Signed)
Visit Information  [name]  - as a part of your Medicaid benefit, you are eligible for care management and care coordination services at no cost or copay. I was unable to reach you by phone today but would be happy to help you with your health related needs. Please feel free to call me @ [phone].   A member of the Managed Medicaid care management team will reach out to you again over the next 7-14 days.   Estanislado Emms RN, BSN [place]  [employer]

## 2021-02-19 NOTE — Patient Outreach (Signed)
Care Coordination  02/19/2021  Debra Williamson 1980-12-21 [id number]   Medicaid Managed Care   Unsuccessful Outreach Note  02/19/2021 Name: Debra Williamson MRN: [id number] DOB: 11-08-1980  Referred by: Kallie Locks, FNP (Inactive) Reason for referral : High Risk Managed Medicaid (Unsuccessful RNCM follow up outreach)   An unsuccessful telephone outreach was attempted today. The patient was referred to the case management team for assistance with care management and care coordination.   Follow Up Plan: A HIPAA compliant phone message was left for the patient providing contact information and requesting a return call.  The care management team will reach out to the patient again over the next 7-14 days.   Estanislado Emms RN, BSN [place]  [employer]

## 2021-02-20 DIAGNOSIS — D571 Sickle-cell disease without crisis: Secondary | ICD-10-CM | POA: Diagnosis not present

## 2021-02-22 DIAGNOSIS — Z79899 Other long term (current) drug therapy: Secondary | ICD-10-CM | POA: Diagnosis not present

## 2021-02-22 DIAGNOSIS — D571 Sickle-cell disease without crisis: Secondary | ICD-10-CM | POA: Diagnosis not present

## 2021-02-23 ENCOUNTER — Ambulatory Visit (INDEPENDENT_AMBULATORY_CARE_PROVIDER_SITE_OTHER): Payer: Medicaid Other | Admitting: Nurse Practitioner

## 2021-02-23 ENCOUNTER — Telehealth: Payer: Self-pay | Admitting: Neurology

## 2021-02-23 ENCOUNTER — Other Ambulatory Visit: Payer: Self-pay

## 2021-02-23 ENCOUNTER — Encounter: Payer: Self-pay | Admitting: Nurse Practitioner

## 2021-02-23 VITALS — BP 121/66 | HR 75 | Temp 97.7°F | Ht 64.0 in | Wt 182.4 lb

## 2021-02-23 DIAGNOSIS — D571 Sickle-cell disease without crisis: Secondary | ICD-10-CM | POA: Diagnosis not present

## 2021-02-23 DIAGNOSIS — Z8673 Personal history of transient ischemic attack (TIA), and cerebral infarction without residual deficits: Secondary | ICD-10-CM | POA: Diagnosis not present

## 2021-02-23 DIAGNOSIS — G894 Chronic pain syndrome: Secondary | ICD-10-CM

## 2021-02-23 DIAGNOSIS — R569 Unspecified convulsions: Secondary | ICD-10-CM

## 2021-02-23 NOTE — Progress Notes (Signed)
[facility] Patient [facility] [address] [place], [place]  [address] Phone:  [phone]   Fax:  [phone]   Established Patient Office Visit  Subjective:  Patient ID: Debra Williamson, female    DOB: October 11, 1981  Age: 40 y.o. MRN: [id number]  CC:  Chief Complaint  Patient presents with  . Follow-up    Follow up pt is not having any pain today     HPI Debra Williamson presents for follow up. A former patient of NP Stroud.  She  has a past medical history of Anemia, Blood transfusion without reported diagnosis, Migraine (05/2019), Seizures (HCC), Sickle cell disease (HCC), and Stroke (HCC).   She reports that she recently received her monthly blood exchange therapy at [facility] where is receives care at [facility]. Denies fever, headache, cough, wheezing, shortness of breath, chest pains, abdominal pain, back pain, hip pain, or leg pain. Denies any open wounds, skin irritation.   Last eye exam was completed recently. She has stigmatism has worsen and has new glasses which are effective.   She reports that she does not do well with the Xtampza 27 mg. She is wanting to switch back to the Oxycodone ER 30 mg . She reports dizziness and intolerance etc.  She reports that this was to be changed. However, on 01/24/2021 she was provided Marlowe Kays by NP Bradly Chris. She has been on Xtapamza for 6 months. She admits that she has spoken with [employer]. She was unable to void.    Past Medical History:  Diagnosis Date  . Anemia   . Blood transfusion without reported diagnosis   . Migraine 05/2019  . Seizures (HCC)    one time incident, may have been r/t to a pain medication she was prescribed  . Sickle cell disease (HCC)   . Stroke [facility])     Past Surgical History:  Procedure Laterality Date  . CESAREAN SECTION     x4  . CHOLECYSTECTOMY    . PORTACATH PLACEMENT Right w-3  . reverse tubal ligation    . TEE WITHOUT CARDIOVERSION N/A 09/10/2016   Procedure: TRANSESOPHAGEAL ECHOCARDIOGRAM (TEE);   Surgeon: Thurmon Fair, MD;  Location: [facility] ENDOSCOPY;  Service: Cardiovascular;  Laterality: N/A;    Family History  Problem Relation Age of Onset  . HIV/AIDS Father     Social History   Socioeconomic History  . Marital status: Married    Spouse name: Not on file  . Number of children: 4  . Years of education: Not on file  . Highest education level: Not on file  Occupational History  . Occupation: Doesn't work  Tobacco Use  . Smoking status: Never Smoker  . Smokeless tobacco: Never Used  Vaping Use  . Vaping Use: Never used  Substance and Sexual Activity  . Alcohol use: No    Alcohol/week: 0.0 standard drinks    Comment: occasionally  . Drug use: No  . Sexual activity: Yes  Other Topics Concern  . Not on file  Social History Narrative   Right handed    Lives with husband and kids    Social Determinants of Health   Financial Resource Strain: Not on file  Food Insecurity: Not on file  Transportation Needs: Not on file  Physical Activity: Not on file  Stress: Not on file  Social Connections: Not on file  Intimate Partner Violence: Not on file    Outpatient Medications Prior to Visit  Medication Sig Dispense Refill  . albuterol (VENTOLIN HFA) 108 (90  Base) MCG/ACT inhaler INHALE 2 PUFFS BY MOUTH INTO THE LUNGS EVERY 4 HOURS AS NEEDED FOR WHEEZING OR SHORTNESS OF BREATH AND COUGH 8.5 g 11  . aspirin EC 81 MG tablet Take 1 tablet by mouth every evening.     . folic acid (FOLVITE) 1 MG tablet Take 1 mg by mouth daily.     . hydrOXYzine (ATARAX/VISTARIL) 25 MG tablet Take 0.5-1 tablets (12.5-25 mg total) by mouth every 8 (eight) hours as needed for itching. 60 tablet 6  . lamoTRIgine (LAMICTAL) 200 MG tablet TAKE 1 TABLET BY MOUTH 2 TIMES DAILY. 180 tablet 3  . levETIRAcetam (KEPPRA) 1000 MG tablet Take 1 and 1/2 tablets by mouth twice a day (Patient taking differently: Take 1 and 1/2 tablets by mouth twice a day) 270 tablet 3  . Oxycodone HCl 10 MG TABS TAKE 1 TABLET BY  MOUTH EVERY 4 HOURS AS NEEDED FOR UP TO 15 DAYS FOR PAIN 90 tablet 0  . promethazine (PHENERGAN) 25 MG tablet Take 1 tablet (25 mg total) by mouth every 8 (eight) hours as needed for nausea or vomiting. 1 tablet every 8 hours prn 30 tablet 2  . oxyCODONE ER 27 MG C12A TAKE 1 CAPSULE BY MOUTH EVERY 12 HOURS (FILL 01/21/21) 60 capsule 0  . oxyCODONE ER 27 MG C12A TAKE 1 CAPSULE BY MOUTH EVERY 12 HOURS 60 capsule 0  . Oxycodone HCl 10 MG TABS TAKE 1 TABLET BY MOUTH EVERY 4 HOURS AS NEEDED FOR PAIN FOR UP TO 15 DAYS (FILL 12/28/20) 90 tablet 0  . Oxycodone HCl 10 MG TABS TAKE 1 TABLET BY MOUTH EVERY 4 HOURS AS NEEDED FOR PAIN 90 tablet 0  . zolpidem (AMBIEN) 5 MG tablet Take 1 tablet (5 mg total) by mouth at bedtime as needed. for sleep (Patient not taking: Reported on 02/23/2021) 30 tablet 0  . oxyCODONE ER 27 MG C12A TAKE 1 CAPSULE BY MOUTH EVERY 12 HOURS (11/16/20) 60 capsule 0   No facility-administered medications prior to visit.    Allergies  Allergen Reactions  . Zofran [Ondansetron Hcl] Hives and Itching  . Demerol [Meperidine] Other (See Comments)    Pt has seizures  . Fentanyl And Related Nausea And Vomiting and Other (See Comments)    Very sedated  **Not allergic to the injection** JUST ALLERGIC TO PATCH  . Other Itching    Greek Yogurt  . Trazodone And Nefazodone Other (See Comments)    Patient was dizzy and had stuffy nose   . Codeine Hives and Itching  . Hydrocodone Hives and Itching  . Ibuprofen     ROS Review of Systems    Objective:    Physical Exam HENT:     Head: Normocephalic and atraumatic.     Nose: Nose normal.     Mouth/Throat:     Mouth: Mucous membranes are moist.  Cardiovascular:     Rate and Rhythm: Normal rate and regular rhythm.     Pulses: Normal pulses.     Heart sounds: Normal heart sounds.  Pulmonary:     Effort: Pulmonary effort is normal.     Breath sounds: Normal breath sounds.  Abdominal:     Palpations: Abdomen is soft.   Musculoskeletal:        General: Normal range of motion.     Right lower leg: No edema.     Left lower leg: No edema.     Comments: Right foot brace   Skin:    General: Skin  is warm and dry.     Capillary Refill: Capillary refill takes less than 2 seconds.  Neurological:     Mental Status: She is alert and oriented to person, place, and time.     BP 121/66 (BP Location: Right Arm, Patient Position: Sitting, Cuff Size: Normal)   Pulse 75   Temp 97.7 F (36.5 C) (Temporal)   Ht 5\' 4"  (1.626 m)   Wt 182 lb 6.4 oz (82.7 kg)   SpO2 97%   BMI 31.31 kg/m  Wt Readings from Last 3 Encounters:  02/23/21 182 lb 6.4 oz (82.7 kg)  12/08/20 178 lb 3.2 oz (80.8 kg)  12/05/20 176 lb (79.8 kg)     Health Maintenance Due  Topic Date Due  . Hepatitis C Screening  Never done  . PAP SMEAR-Modifier  05/13/2020  . COVID-19 Vaccine (3 - Booster for Moderna series) 10/01/2020    There are no preventive care reminders to display for this patient.  Lab Results  Component Value Date   TSH 0.668 06/21/2016   Lab Results  Component Value Date   WBC 9.3 12/08/2018   HGB 7.7 (L) 12/08/2018   HCT 24.4 (L) 12/08/2018   MCV 88.7 12/08/2018   PLT 811 (H) 12/08/2018   Lab Results  Component Value Date   NA 137 11/30/2018   K 4.2 11/30/2018   CO2 24 11/30/2018   GLUCOSE 88 11/30/2018   BUN 10 11/30/2018   CREATININE 0.67 11/30/2018   BILITOT 2.3 (H) 11/30/2018   ALKPHOS 64 11/30/2018   AST 33 11/30/2018   ALT 25 11/30/2018   PROT 7.7 11/30/2018   ALBUMIN 4.4 11/30/2018   CALCIUM 8.9 11/30/2018   ANIONGAP 10 11/30/2018   Lab Results  Component Value Date   CHOL 128 09/07/2016   Lab Results  Component Value Date   HDL 26 (L) 09/07/2016   Lab Results  Component Value Date   LDLCALC 60 09/07/2016   Lab Results  Component Value Date   TRIG 211 (H) 09/07/2016   Lab Results  Component Value Date   CHOLHDL 4.9 09/07/2016   Lab Results  Component Value Date   HGBA1C <4.2  (L) 09/07/2016      Assessment & Plan:   Problem List Items Addressed This Visit      Other   Chronic pain syndrome   Hb-SS disease without crisis (HCC) - Primary Ensure adequate hydration. Move frequently to reduce venous thromboembolism risk. Avoid situations that could lead to dehydration or could exacerbate pain Discussed S&S of infection, seizures, stroke acute chest, DVT and how important it is to seek medical attention Take medication as directed along with pain contract and overall compliance Discussed the risk related to opiate use (addition, tolerance and dependency)     Seizures (HCC) (Chronic)\ Stable on current treatment regimen     Other Visit Diagnoses    History of stroke     Stable with persistent left sided weakness       No orders of the defined types were placed in this encounter.   Follow-up: Return in about 2 months (around 04/25/2021) for Follow up SCD 06/26/2021.    [id number], NP

## 2021-02-23 NOTE — Telephone Encounter (Signed)
Information sent via mail about seizure dogs

## 2021-02-23 NOTE — Telephone Encounter (Signed)
New message    Patient has general questions dog that specialist in seizure alert.

## 2021-02-23 NOTE — Patient Instructions (Signed)
Sickle Cell Anemia, Adult  Sickle cell anemia is a condition where your red blood cells are shaped like sickles. Red blood cells carry oxygen through the body. Sickle-shaped cells do not live as long as normal red blood cells. They also clump together and block blood from flowing through the blood vessels. This prevents the body from getting enough oxygen. Sickle cell anemia causes organ damage and pain. It also increases the risk of infection. Follow these instructions at home: Medicines  Take over-the-counter and prescription medicines only as told by your doctor.  If you were prescribed an antibiotic medicine, take it as told by your doctor. Do not stop taking the antibiotic even if you start to feel better.  If you develop a fever, do not take medicines to lower the fever right away. Tell your doctor about the fever. Managing pain, stiffness, and swelling  Try these methods to help with pain: ? Use a heating pad. ? Take a warm bath. ? Distract yourself, such as by watching TV. Eating and drinking  Drink enough fluid to keep your pee (urine) clear or pale yellow. Drink more in hot weather and during exercise.  Limit or avoid alcohol.  Eat a healthy diet. Eat plenty of fruits, vegetables, whole grains, and lean protein.  Take vitamins and supplements as told by your doctor. Traveling  When traveling, keep these with you: ? Your medical information. ? The names of your doctors. ? Your medicines.  If you need to take an airplane, talk to your doctor first. Activity  Rest often.  Avoid exercises that make your heart beat much faster, such as jogging. General instructions  Do not use products that have nicotine or tobacco, such as cigarettes and e-cigarettes. If you need help quitting, ask your doctor.  Consider wearing a medical alert bracelet.  Avoid being in high places (high altitudes), such as mountains.  Avoid very hot or cold temperatures.  Avoid places where the  temperature changes a lot.  Keep all follow-up visits as told by your doctor. This is important. Contact a doctor if:  A joint hurts.  Your feet or hands hurt or swell.  You feel tired (fatigued). Get help right away if:  You have symptoms of infection. These include: ? Fever. ? Chills. ? Being very tired. ? Irritability. ? Poor eating. ? Throwing up (vomiting).  You feel dizzy or faint.  You have new stomach pain, especially on the left side.  You have a an erection (priapism) that lasts more than 4 hours.  You have numbness in your arms or legs.  You have a hard time moving your arms or legs.  You have trouble talking.  You have pain that does not go away when you take medicine.  You are short of breath.  You are breathing fast.  You have a long-term cough.  You have pain in your chest.  You have a bad headache.  You have a stiff neck.  Your stomach looks bloated even though you did not eat much.  Your skin is pale.  You suddenly cannot see well. Summary  Sickle cell anemia is a condition where your red blood cells are shaped like sickles.  Follow your doctor's advice on ways to manage pain, food to eat, activities to do, and steps to take for safe travel.  Get medical help right away if you have any signs of infection, such as a fever. This information is not intended to replace advice given to you by   your health care provider. Make sure you discuss any questions you have with your health care provider. Document Revised: 03/02/2020 Document Reviewed: 03/02/2020 Elsevier Patient Education  2021 Elsevier Inc.  

## 2021-02-28 ENCOUNTER — Other Ambulatory Visit (HOSPITAL_COMMUNITY): Payer: Self-pay

## 2021-02-28 MED ORDER — PROMETHAZINE HCL 25 MG PO TABS
25.0000 mg | ORAL_TABLET | Freq: Three times a day (TID) | ORAL | 2 refills | Status: AC | PRN
  Filled 2021-02-28 – 2021-03-10 (×2): qty 30, 10d supply, fill #0

## 2021-02-28 MED FILL — Albuterol Sulfate Inhal Aero 108 MCG/ACT (90MCG Base Equiv): RESPIRATORY_TRACT | 16 days supply | Qty: 8.5 | Fill #0 | Status: CN

## 2021-03-05 ENCOUNTER — Telehealth: Payer: Self-pay | Admitting: Neurology

## 2021-03-05 ENCOUNTER — Other Ambulatory Visit (HOSPITAL_COMMUNITY): Payer: Self-pay

## 2021-03-05 ENCOUNTER — Other Ambulatory Visit: Payer: Self-pay | Admitting: Family Medicine

## 2021-03-05 DIAGNOSIS — Z95828 Presence of other vascular implants and grafts: Secondary | ICD-10-CM

## 2021-03-05 NOTE — Telephone Encounter (Signed)
Patient called back in and wants to make sure the letter mentions it is because she has seizures.

## 2021-03-05 NOTE — Telephone Encounter (Signed)
Patient called and requested a letter stating she is okay to have a service dog. She'd like to pick it up when ready.  She said she already has the paperwork but she needs a letter to accompany it to include her name, the doctors name and the doctor's signature.

## 2021-03-06 ENCOUNTER — Other Ambulatory Visit (HOSPITAL_COMMUNITY): Payer: Self-pay

## 2021-03-06 ENCOUNTER — Telehealth: Payer: Self-pay

## 2021-03-06 NOTE — Telephone Encounter (Signed)
Pt is asking for her Numbing medicine to be refilled

## 2021-03-07 ENCOUNTER — Other Ambulatory Visit (HOSPITAL_COMMUNITY): Payer: Self-pay

## 2021-03-08 ENCOUNTER — Telehealth: Payer: Self-pay

## 2021-03-08 ENCOUNTER — Other Ambulatory Visit: Payer: Self-pay | Admitting: Family Medicine

## 2021-03-08 ENCOUNTER — Other Ambulatory Visit (HOSPITAL_COMMUNITY): Payer: Self-pay

## 2021-03-08 DIAGNOSIS — Z95828 Presence of other vascular implants and grafts: Secondary | ICD-10-CM

## 2021-03-08 NOTE — Telephone Encounter (Signed)
Pt asking why her numbing medicine was discontinued?  Please call patient

## 2021-03-08 NOTE — Telephone Encounter (Signed)
Refill request sent to provider for review.

## 2021-03-09 ENCOUNTER — Other Ambulatory Visit (HOSPITAL_COMMUNITY): Payer: Self-pay

## 2021-03-09 ENCOUNTER — Other Ambulatory Visit: Payer: Self-pay | Admitting: Nurse Practitioner

## 2021-03-09 DIAGNOSIS — Z95828 Presence of other vascular implants and grafts: Secondary | ICD-10-CM

## 2021-03-09 MED ORDER — LIDOCAINE-PRILOCAINE 2.5-2.5 % EX CREA
1.0000 "application " | TOPICAL_CREAM | CUTANEOUS | 3 refills | Status: DC | PRN
  Filled 2021-03-09: qty 30, 30d supply, fill #0

## 2021-03-09 MED ORDER — LIDOCAINE-PRILOCAINE 2.5-2.5 % EX CREA
1.0000 "application " | TOPICAL_CREAM | CUTANEOUS | 11 refills | Status: AC | PRN
  Filled 2021-03-09: qty 30, 30d supply, fill #0

## 2021-03-09 NOTE — Telephone Encounter (Signed)
Yes please

## 2021-03-09 NOTE — Telephone Encounter (Signed)
Patient called again for lidocaine-prilocaine (EMLA) cream. No longer on medication list. Can you please reorder?

## 2021-03-09 NOTE — Telephone Encounter (Signed)
sent 

## 2021-03-10 ENCOUNTER — Other Ambulatory Visit (HOSPITAL_COMMUNITY): Payer: Self-pay

## 2021-03-10 MED FILL — Albuterol Sulfate Inhal Aero 108 MCG/ACT (90MCG Base Equiv): RESPIRATORY_TRACT | 28 days supply | Qty: 8.5 | Fill #0 | Status: AC

## 2021-03-12 ENCOUNTER — Other Ambulatory Visit (HOSPITAL_COMMUNITY): Payer: Self-pay

## 2021-03-12 ENCOUNTER — Telehealth: Payer: Self-pay | Admitting: Neurology

## 2021-03-12 MED FILL — Lamotrigine Tab 200 MG: ORAL | 90 days supply | Qty: 180 | Fill #0 | Status: AC

## 2021-03-12 NOTE — Telephone Encounter (Signed)
Patient called and said she is needing a refill on her 200 MG Lamictal sent to [facility].  She said she is completely out.

## 2021-03-12 NOTE — Telephone Encounter (Signed)
Pt will call the pharmacy to see if they have the refill on fill when we sent the script In Feb it had 3 refills on it. Pt advised that if they do not have the refills we will send in her medication

## 2021-03-14 ENCOUNTER — Other Ambulatory Visit (HOSPITAL_COMMUNITY): Payer: Self-pay

## 2021-03-15 ENCOUNTER — Other Ambulatory Visit: Payer: Self-pay | Admitting: Family Medicine

## 2021-03-15 ENCOUNTER — Other Ambulatory Visit (HOSPITAL_COMMUNITY): Payer: Self-pay

## 2021-03-15 ENCOUNTER — Other Ambulatory Visit: Payer: Self-pay | Admitting: Nurse Practitioner

## 2021-03-15 DIAGNOSIS — D571 Sickle-cell disease without crisis: Secondary | ICD-10-CM

## 2021-03-15 DIAGNOSIS — G894 Chronic pain syndrome: Secondary | ICD-10-CM

## 2021-03-15 DIAGNOSIS — Z79899 Other long term (current) drug therapy: Secondary | ICD-10-CM

## 2021-03-15 DIAGNOSIS — F1190 Opioid use, unspecified, uncomplicated: Secondary | ICD-10-CM

## 2021-03-15 MED ORDER — OXYCODONE HCL 10 MG PO TABS
ORAL_TABLET | ORAL | 0 refills | Status: DC
  Filled 2021-03-15: qty 90, 15d supply, fill #0

## 2021-03-16 ENCOUNTER — Other Ambulatory Visit (HOSPITAL_COMMUNITY): Payer: Self-pay

## 2021-03-16 ENCOUNTER — Telehealth: Payer: Self-pay

## 2021-03-16 NOTE — Telephone Encounter (Signed)
Med refill  Oxycodone Oxycontin

## 2021-03-20 ENCOUNTER — Other Ambulatory Visit: Payer: Self-pay | Admitting: Internal Medicine

## 2021-03-20 DIAGNOSIS — G894 Chronic pain syndrome: Secondary | ICD-10-CM

## 2021-03-20 DIAGNOSIS — Z79899 Other long term (current) drug therapy: Secondary | ICD-10-CM

## 2021-03-20 DIAGNOSIS — F1190 Opioid use, unspecified, uncomplicated: Secondary | ICD-10-CM

## 2021-03-20 DIAGNOSIS — D571 Sickle-cell disease without crisis: Secondary | ICD-10-CM | POA: Diagnosis not present

## 2021-03-20 MED ORDER — OXYCONTIN 30 MG PO T12A
1.0000 | EXTENDED_RELEASE_TABLET | Freq: Two times a day (BID) | ORAL | 0 refills | Status: DC
  Filled 2021-03-21: qty 60, 30d supply, fill #0

## 2021-03-20 NOTE — Progress Notes (Signed)
Discussed with patient about her medication regimen. We both agreed to switch her long acting pain medication back to her previous OxyContin 30 mg Q 12 H. All questions answered, all concerns addressed. Patient also counseled extensively about other non-pharmacologic means of pain management.  60 tablets of OxyContin PO was prescribed electronically to [facility] for pick-up tomorrow 03/21/2021.  Jeanann Lewandowsky, MD  03/20/21 6:07 PM

## 2021-03-21 ENCOUNTER — Other Ambulatory Visit (HOSPITAL_COMMUNITY): Payer: Self-pay

## 2021-03-21 ENCOUNTER — Encounter: Payer: Self-pay | Admitting: Neurology

## 2021-03-21 NOTE — Telephone Encounter (Signed)
Done, thanks

## 2021-03-21 NOTE — Telephone Encounter (Signed)
Pt called and advised that her letter was ready to be picked up

## 2021-03-22 ENCOUNTER — Other Ambulatory Visit (HOSPITAL_COMMUNITY): Payer: Self-pay

## 2021-03-22 ENCOUNTER — Other Ambulatory Visit: Payer: Self-pay

## 2021-03-22 ENCOUNTER — Telehealth: Payer: Self-pay | Admitting: Internal Medicine

## 2021-03-22 DIAGNOSIS — D571 Sickle-cell disease without crisis: Secondary | ICD-10-CM | POA: Diagnosis not present

## 2021-03-22 NOTE — Telephone Encounter (Signed)
Approved  PA Case: [id number], Status: Approved, Coverage Starts on: 03/22/2021 12:00:00 AM, Coverage Ends on: 06/20/2021 12:00:00 AM.

## 2021-03-22 NOTE — Telephone Encounter (Signed)
PA for OxyContin via cover my meds. (Key: [id number])  Your information has been sent to [facility].

## 2021-03-22 NOTE — Patient Outreach (Signed)
Medicaid Managed Care    Pharmacy Note  03/22/2021 Name: Debra Williamson MRN: [id number] DOB: [birth year]  Debra Williamson is a 40 y.o. year old female who is a primary care patient of No primary care provider on file.. The Medicaid Managed Care Coordination team was consulted for assistance with disease management and care coordination needs.    Engaged with patient Engaged with patient by telephone for follow up visit in response to referral for case management and/or care coordination services.  Debra Williamson was given information about Managed Medicaid Care Coordination team services today. Debra Williamson agreed to services and verbal consent obtained.   Objective:  Lab Results  Component Value Date   CREATININE 0.67 11/30/2018   CREATININE 0.43 (L) 11/26/2018   CREATININE 0.52 11/25/2018    Lab Results  Component Value Date   HGBA1C <4.2 (L) 09/07/2016       Component Value Date/Time   CHOL 128 09/07/2016 0337   TRIG 211 (H) 09/07/2016 0337   HDL 26 (L) 09/07/2016 0337   CHOLHDL 4.9 09/07/2016 0337   VLDL 42 (H) 09/07/2016 0337   LDLCALC 60 09/07/2016 0337    Other: (TSH, CBC, Vit D, etc.)  Clinical ASCVD: No  The ASCVD Risk score Denman George DC Jr., et al., 2013) failed to calculate for the following reasons:   Cannot find a previous HDL lab   Cannot find a previous total cholesterol lab    Other: (CHADS2VASc if Afib, PHQ9 if depression, MMRC or CAT for COPD, ACT, DEXA)  BP Readings from Last 3 Encounters:  02/23/21 121/66  12/08/20 109/75  12/05/20 117/63    Assessment/Interventions: Review of patient past medical history, allergies, medications, health status, including review of consultants reports, laboratory and other test data, was performed as part of comprehensive evaluation and provision of chronic care management services.    Sickle Cell Disease Managed by Hematologist Folic Acid 1mg  QD Plan: At goal,  patient stable/ symptoms  controlled   Chronic Pain Pain scale: 10/25/19: Didn't state scale but patient said medications aren't working at all and her new one takes 4 hours to kick in 03/22/21: Didn't state pain scale because isn't on complete therapy Oxycodone 10mg  IR Xtampza (Oxycodone 27 ER) (Takes too long to work) -Wasn't sure why she was taking multiple forms and why it didn't work Jan 2022 Plan: Explained difference between ER and IR and possible strategies to improve relief March 2022: Patient got new script for pain meds but required a PA. Per MyChart msg from 12/21/20, PCP asked patient to contact Rx to get them to fax it over again. The patient did not so after my visit with the patient I called Rx and they Faxed, CoverMyMeds, and electronically sent it for a PA June 2022: PA finally approved today, June 2nd. Called Daphne at [facility] who said she would fill it and have it ready by 5-5:30 (Stated was in stock as well)  Seizures Lamotrigine 200mg  BID Levetiracetam 1000mg  1.5 BID -Patient had a very flat affect during Phone call on 03/22/21, will monitor at f/u visits Plan: At goal,  patient stable/ symptoms controlled   Insomnia Zolpidem 5mg  HS Tried/failed: Trazodone (Stuffy nose and dizzy) Plan: At goal,  patient stable/ symptoms controlled    Medications -Doesn't organize medications -Keeps in backpack -Outpatient at [facility] (Husband works at hospital and picks them up)   SDOH (Social Determinants of Health) assessments and interventions performed:    Care Plan  Allergies  Allergen Reactions  .  Zofran [Ondansetron Hcl] Hives and Itching  . Demerol [Meperidine] Other (See Comments)    Pt has seizures  . Fentanyl And Related Nausea And Vomiting and Other (See Comments)    Very sedated  **Not allergic to the injection** JUST ALLERGIC TO PATCH  . Other Itching    Greek Yogurt  . Trazodone And Nefazodone Other (See Comments)    Patient was dizzy and had stuffy nose   .  Codeine Hives and Itching  . Hydrocodone Hives and Itching  . Ibuprofen     Medications Reviewed Today    Reviewed by Barbette Merino, NP (Nurse Practitioner) on 02/23/21 at 1114  Med List Status: <None>  Medication Order Taking? Sig Documenting Provider Last Dose Status Informant  albuterol (VENTOLIN HFA) 108 (90 Base) MCG/ACT inhaler [id number] Yes INHALE 2 PUFFS BY MOUTH INTO THE LUNGS EVERY 4 HOURS AS NEEDED FOR WHEEZING OR SHORTNESS OF BREATH AND COUGH Kallie Locks, FNP Taking Active            Med Note Kyung Rudd, Janice Coffin   Tue Oct 24, 2020  1:51 PM) Taking PRN, 2x/week  aspirin EC 81 MG tablet [id number] Yes Take 1 tablet by mouth every evening.  [provider] Taking Active Self           Med Note Bunnie Philips   Sat Mar 11, 2015  7:32 PM) .   folic acid (FOLVITE) 1 MG tablet [id number] Yes Take 1 mg by mouth daily.  [provider] Taking Active Self           Med Note Judd Gaudier   Tue Dec 05, 2020  8:36 AM)    hydrOXYzine (ATARAX/VISTARIL) 25 MG tablet [id number] Yes Take 0.5-1 tablets (12.5-25 mg total) by mouth every 8 (eight) hours as needed for itching. Kallie Locks, FNP Taking Active   lamoTRIgine (LAMICTAL) 200 MG tablet [id number] Yes TAKE 1 TABLET BY MOUTH 2 TIMES DAILY. Van Clines, MD Taking Active   levETIRAcetam (KEPPRA) 1000 MG tablet [id number] Yes Take 1 and 1/2 tablets by mouth twice a day  Patient taking differently: Take 1 and 1/2 tablets by mouth twice a day   Van Clines, MD Taking Active   Oxycodone HCl 10 MG TABS [id number]  TAKE 1 TABLET BY MOUTH EVERY 4 HOURS AS NEEDED FOR PAIN FOR UP TO 15 DAYS (FILL 12/28/20) Kallie Locks, FNP  Active   Oxycodone HCl 10 MG TABS [id number]  TAKE 1 TABLET BY MOUTH EVERY 4 HOURS AS NEEDED FOR PAIN Kallie Locks, FNP  Active   Oxycodone HCl 10 MG TABS [id number] Yes TAKE 1 TABLET BY MOUTH EVERY 4 HOURS AS NEEDED FOR UP TO 15 DAYS FOR PAIN Massie Maroon, FNP Taking Active    promethazine (PHENERGAN) 25 MG tablet [id number] Yes Take 1 tablet (25 mg total) by mouth every 8 (eight) hours as needed for nausea or vomiting. 1 tablet every 8 hours prn Kallie Locks, FNP Taking Active            Med Note Kallie Locks   Tue Dec 05, 2020  9:33 AM) As needed       Patient not taking:      Discontinued 12/05/20 0836 (Side effect (s))            Med Note Kyung Rudd, Janice Coffin   Tue Oct 24, 2020  1:42 PM) Patient was dizzy and had stuffy nose  zolpidem (AMBIEN) 5  MG tablet [id number] No Take 1 tablet (5 mg total) by mouth at bedtime as needed. for sleep  Patient not taking: Reported on 02/23/2021   Kallie Locks, FNP Not Taking Active           Patient Active Problem List   Diagnosis Date Noted  . Weakness of lower extremity 02/27/2020  . Insomnia 08/24/2019  . Increased storage iron 08/24/2019  . Nausea 07/02/2019  . Chronic pain syndrome   . Sickle cell crisis (HCC) 11/26/2018  . Chest pain 11/25/2018  . Vaso-occlusive sickle cell crisis (HCC) 11/25/2018  . Sickle cell pain crisis (HCC) 11/24/2018  . Chronic, continuous use of opioids 10/27/2017  . 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 TIA (transient ischemic attack)   . Vertigo 09/07/2016  . Dyspnea 09/07/2016  . Lightheadedness 09/06/2016  . Healthcare maintenance 07/30/2016  . Dizziness, nonspecific 07/30/2016  . Left foot drop 07/30/2016  . Left-sided weakness 07/30/2016  . 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 History of stroke associated with blood clotting tendency 01/02/2016  . [place] disease (HCC) 10/27/2015  . Hb-SS disease with acute chest syndrome (HCC) 08/14/2015  . Acute chest syndrome (HCC)   . Acute respiratory failure with hypoxemia (HCC)   . Community acquired pneumonia 08/08/2015  . Hemiparesis following cerebrovascular accident (CVA)  (HCC) 05/30/2015  . Ankle gives out 05/30/2015  . Seizure disorder (HCC) 05/30/2015  . 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 H/O: stroke 03/12/2015  . Seizures (HCC) 03/12/2015  . Sickle cell disease (HCC) 03/11/2015  . Symptomatic anemia 03/11/2015    Conditions to be addressed/monitored: Chronic Pain  Care Plan : Medication Management  Updates made by Zettie Pho, RPH since 12/22/2020 12:00 AM    Problem: Health Promotion or Disease Self-Management (General Plan of Care)     Goal: Medication Management   Note:   Current Barriers:  . Does not maintain contact with provider office . Does not contact provider office for questions/concerns .   Pharmacist Clinical Goal(s):  Marland Kitchen Over the next 90 days, patient will contact provider office for questions/concerns as evidenced notation of same in electronic health record through collaboration with PharmD and provider.  .   Interventions: . Inter-disciplinary care team collaboration (see longitudinal plan of care) . Comprehensive medication review performed; medication list updated in electronic medical record  Chronic Pain  Patient Goals/Self-Care Activities . Over the next 90 days, patient will:  - collaborate with provider on medication access solutions  Follow Up Plan: The patient has been provided with contact information for the care management team and has been advised to call with any health related questions or concerns.     Task: Mutually Develop and Malen Gauze Achievement of Patient Goals   Note:   Care Management Activities:    - verbalization of feelings encouraged    Notes:      Medication Assistance: Contacted Rx for PA fax  Follow up: Agree   Plan: The patient has been provided with contact information for the care management team and has been advised to call with any health related questions or concerns.   Artelia Laroche, Pharm.D., Managed Medicaid Pharmacist - [phone]

## 2021-03-22 NOTE — Patient Instructions (Signed)
Visit Information  [name] was given information about Medicaid Managed Care team care coordination services as a part of their [employer] Medicaid benefit. Debra Williamson verbally consented to engagement with the [facility] Managed Care team.   For questions related to your Healthy [facility] health plan, please call: [phone] or visit the homepage here: [email or web address]  If you would like to schedule transportation through your Healthy [facility] plan, please call the following number at least 2 days in advance of your appointment: [phone]   Call the [facility] Crisis Line at [phone], at any time, 24 hours a day, 7 days a week. If you are in danger or need immediate medical attention call 911.  Debra Williamson - following are the goals we discussed in your visit today:  Goals Addressed   None     Please see education materials related to Pain provided as print materials.   Patient verbalizes understanding of instructions provided today.   The patient has been provided with contact information for the Managed Medicaid care management team and has been advised to call with any health related questions or concerns.   Debra Williamson, Pharm.D., Managed Medicaid Pharmacist [phone]   Following is a copy of your plan of care:  Care Plan : Medication Management  Updates made by Debra Williamson, RPH since 03/22/2021 12:00 AM    Problem: Health Promotion or Disease Self-Management (General Plan of Care)     Goal: Medication Management   Note:   Current Barriers:  . Does not maintain contact with provider office . Does not contact provider office for questions/concerns o UPDATE: Patient has gone 3 months without Oxy 30mg  and has not contacted me and has rarely contacted her Provider.  Pharmacist Clinical Goal(s):  Over the next 90 days, patient will contact provider office for questions/concerns as evidenced  notation of same in electronic health record through collaboration with PharmD and provider.  .   Interventions: . Inter-disciplinary care team collaboration (see longitudinal plan of care) . Comprehensive medication review performed; medication list updated in electronic medical record  Chronic Pain  Patient Goals/Self-Care Activities . Over the next 90 days, patient will:  - collaborate with provider on medication access solutions  Follow Up Plan: The patient has been provided with contact information for the care management team and has been advised to call with any health related questions or concerns.

## 2021-03-26 ENCOUNTER — Other Ambulatory Visit: Payer: Self-pay | Admitting: Nurse Practitioner

## 2021-03-26 ENCOUNTER — Other Ambulatory Visit (HOSPITAL_COMMUNITY): Payer: Self-pay

## 2021-03-26 DIAGNOSIS — G4700 Insomnia, unspecified: Secondary | ICD-10-CM

## 2021-03-26 NOTE — Progress Notes (Unsigned)
   [facility] [address] [place],   [address] Phone:  [phone]   Fax:  [phone] 

## 2021-03-26 NOTE — Patient Outreach (Signed)
Patient called stating she is having ADR's with Trazodone. It is making her lethargic and sleepy (Per my note last week patient had a flat affect then but was much more engaged today).    Was going to let PCP Raliegh Ip know but new PCP is [facility], will alert and defer to her. Sent direct message.

## 2021-03-30 ENCOUNTER — Other Ambulatory Visit (HOSPITAL_COMMUNITY): Payer: Self-pay

## 2021-04-06 ENCOUNTER — Telehealth: Payer: Self-pay | Admitting: Neurology

## 2021-04-06 ENCOUNTER — Other Ambulatory Visit: Payer: Self-pay | Admitting: Nurse Practitioner

## 2021-04-06 ENCOUNTER — Other Ambulatory Visit (HOSPITAL_COMMUNITY): Payer: Self-pay

## 2021-04-06 ENCOUNTER — Telehealth: Payer: Self-pay

## 2021-04-06 DIAGNOSIS — G894 Chronic pain syndrome: Secondary | ICD-10-CM

## 2021-04-06 DIAGNOSIS — Z79899 Other long term (current) drug therapy: Secondary | ICD-10-CM

## 2021-04-06 DIAGNOSIS — D571 Sickle-cell disease without crisis: Secondary | ICD-10-CM

## 2021-04-06 DIAGNOSIS — T50905A Adverse effect of unspecified drugs, medicaments and biological substances, initial encounter: Secondary | ICD-10-CM

## 2021-04-06 DIAGNOSIS — R110 Nausea: Secondary | ICD-10-CM

## 2021-04-06 DIAGNOSIS — F1190 Opioid use, unspecified, uncomplicated: Secondary | ICD-10-CM

## 2021-04-06 NOTE — Telephone Encounter (Signed)
Oxycontin 10mg 

## 2021-04-06 NOTE — Telephone Encounter (Signed)
Patient called and said she is moving out of the state soon. She cancelled her appointment on 07/02/21 with Dr. Karel Jarvis.  Patient requesting to speak with nurse about managing her medication during her transition to a new neurologist.  [facility]

## 2021-04-09 ENCOUNTER — Telehealth: Payer: Self-pay

## 2021-04-09 ENCOUNTER — Other Ambulatory Visit (HOSPITAL_COMMUNITY): Payer: Self-pay

## 2021-04-09 MED ORDER — PROMETHAZINE HCL 25 MG PO TABS
25.0000 mg | ORAL_TABLET | Freq: Three times a day (TID) | ORAL | 2 refills | Status: AC | PRN
  Filled 2021-04-09: qty 30, 10d supply, fill #0

## 2021-04-09 MED ORDER — HYDROXYZINE HCL 25 MG PO TABS
12.5000 mg | ORAL_TABLET | Freq: Three times a day (TID) | ORAL | 6 refills | Status: AC | PRN
  Filled 2021-04-09: qty 60, 20d supply, fill #0

## 2021-04-09 NOTE — Telephone Encounter (Signed)
Oxycodone Oxycontin 

## 2021-04-09 NOTE — Telephone Encounter (Signed)
Pls let her know to start looking at her insurance on who is under her plan when she moves, we can send refills for her seizure medications until she establishes care (should be within 6 months at most), thanks

## 2021-04-09 NOTE — Telephone Encounter (Signed)
Left messgae of the above

## 2021-04-10 ENCOUNTER — Other Ambulatory Visit (HOSPITAL_COMMUNITY): Payer: Self-pay

## 2021-04-10 ENCOUNTER — Other Ambulatory Visit: Payer: Self-pay | Admitting: Nurse Practitioner

## 2021-04-10 DIAGNOSIS — G894 Chronic pain syndrome: Secondary | ICD-10-CM

## 2021-04-10 DIAGNOSIS — F1190 Opioid use, unspecified, uncomplicated: Secondary | ICD-10-CM

## 2021-04-10 DIAGNOSIS — D571 Sickle-cell disease without crisis: Secondary | ICD-10-CM

## 2021-04-10 DIAGNOSIS — Z79899 Other long term (current) drug therapy: Secondary | ICD-10-CM

## 2021-04-10 MED ORDER — OXYCODONE HCL 10 MG PO TABS
ORAL_TABLET | ORAL | 0 refills | Status: DC
  Filled 2021-04-10: qty 90, 15d supply, fill #0

## 2021-04-11 ENCOUNTER — Other Ambulatory Visit (HOSPITAL_COMMUNITY): Payer: Self-pay

## 2021-04-13 ENCOUNTER — Other Ambulatory Visit (HOSPITAL_COMMUNITY): Payer: Self-pay

## 2021-04-13 ENCOUNTER — Other Ambulatory Visit: Payer: Self-pay | Admitting: Internal Medicine

## 2021-04-13 DIAGNOSIS — F1190 Opioid use, unspecified, uncomplicated: Secondary | ICD-10-CM

## 2021-04-13 DIAGNOSIS — Z79899 Other long term (current) drug therapy: Secondary | ICD-10-CM

## 2021-04-13 DIAGNOSIS — G894 Chronic pain syndrome: Secondary | ICD-10-CM

## 2021-04-13 DIAGNOSIS — D571 Sickle-cell disease without crisis: Secondary | ICD-10-CM

## 2021-04-13 NOTE — Telephone Encounter (Signed)
   Notes to clinic Not a pt in this practice, please assess.  

## 2021-04-16 ENCOUNTER — Other Ambulatory Visit: Payer: Medicaid Other

## 2021-04-16 ENCOUNTER — Other Ambulatory Visit: Payer: Self-pay | Admitting: Internal Medicine

## 2021-04-16 ENCOUNTER — Other Ambulatory Visit (HOSPITAL_COMMUNITY): Payer: Self-pay

## 2021-04-16 DIAGNOSIS — Z79899 Other long term (current) drug therapy: Secondary | ICD-10-CM

## 2021-04-16 DIAGNOSIS — G894 Chronic pain syndrome: Secondary | ICD-10-CM

## 2021-04-16 DIAGNOSIS — F1190 Opioid use, unspecified, uncomplicated: Secondary | ICD-10-CM

## 2021-04-16 DIAGNOSIS — D571 Sickle-cell disease without crisis: Secondary | ICD-10-CM

## 2021-04-16 NOTE — Patient Outreach (Signed)
Patient called me worried she won't get her Oxycodone 30mg  12hour release this month with the long holiday. LF 03/22/21. Called Pharmacy and spoke with 05/22/21 who said the script wasn't in yet. She will fax the doctor asking for new script.  I also sent the provider a msg myself.

## 2021-04-17 ENCOUNTER — Other Ambulatory Visit: Payer: Self-pay | Admitting: Internal Medicine

## 2021-04-17 ENCOUNTER — Other Ambulatory Visit (HOSPITAL_COMMUNITY): Payer: Self-pay

## 2021-04-17 DIAGNOSIS — Z79899 Other long term (current) drug therapy: Secondary | ICD-10-CM

## 2021-04-17 DIAGNOSIS — D571 Sickle-cell disease without crisis: Secondary | ICD-10-CM

## 2021-04-17 DIAGNOSIS — G894 Chronic pain syndrome: Secondary | ICD-10-CM

## 2021-04-17 DIAGNOSIS — F1190 Opioid use, unspecified, uncomplicated: Secondary | ICD-10-CM

## 2021-04-17 NOTE — Patient Outreach (Signed)
Sent private msg to Quentin Angst, MD asking for refill of Oxycodone on the 30th. Provider responded that he'd send it on the 30th (Patient due on the 2nd, 30th is 2 days ahead of time within allowable early refill). Per patient, she's travelling for the 4th and needs it on the 30th.

## 2021-04-18 ENCOUNTER — Other Ambulatory Visit (HOSPITAL_COMMUNITY): Payer: Self-pay

## 2021-04-18 ENCOUNTER — Other Ambulatory Visit: Payer: Self-pay | Admitting: Nurse Practitioner

## 2021-04-18 ENCOUNTER — Telehealth: Payer: Self-pay

## 2021-04-18 DIAGNOSIS — F1190 Opioid use, unspecified, uncomplicated: Secondary | ICD-10-CM

## 2021-04-18 DIAGNOSIS — Z79899 Other long term (current) drug therapy: Secondary | ICD-10-CM

## 2021-04-18 DIAGNOSIS — D571 Sickle-cell disease without crisis: Secondary | ICD-10-CM

## 2021-04-18 DIAGNOSIS — G894 Chronic pain syndrome: Secondary | ICD-10-CM

## 2021-04-18 NOTE — Telephone Encounter (Signed)
Oxycodone Oxycontin 

## 2021-04-19 ENCOUNTER — Other Ambulatory Visit (HOSPITAL_COMMUNITY): Payer: Self-pay

## 2021-04-19 ENCOUNTER — Other Ambulatory Visit: Payer: Self-pay | Admitting: Internal Medicine

## 2021-04-19 DIAGNOSIS — G894 Chronic pain syndrome: Secondary | ICD-10-CM

## 2021-04-19 DIAGNOSIS — Z79899 Other long term (current) drug therapy: Secondary | ICD-10-CM

## 2021-04-19 DIAGNOSIS — F1190 Opioid use, unspecified, uncomplicated: Secondary | ICD-10-CM

## 2021-04-19 DIAGNOSIS — D571 Sickle-cell disease without crisis: Secondary | ICD-10-CM

## 2021-04-19 MED ORDER — OXYCODONE HCL 10 MG PO TABS
10.0000 mg | ORAL_TABLET | ORAL | 0 refills | Status: AC | PRN
  Filled 2021-04-19 – 2021-04-25 (×3): qty 90, 15d supply, fill #0

## 2021-04-19 MED ORDER — OXYCONTIN 30 MG PO T12A
1.0000 | EXTENDED_RELEASE_TABLET | Freq: Two times a day (BID) | ORAL | 0 refills | Status: AC
  Filled 2021-04-19: qty 60, 30d supply, fill #0

## 2021-04-19 NOTE — Patient Outreach (Signed)
Patient called me this morning and stated she still hasn't gotten her Oxycodone. I had sent a msg to Quentin Angst, MD a few days ago asking him to send it in on the 30th to which he affirmed. Sent another msg asking to send in ASAP

## 2021-04-20 ENCOUNTER — Other Ambulatory Visit (HOSPITAL_COMMUNITY): Payer: Self-pay

## 2021-04-24 ENCOUNTER — Other Ambulatory Visit (HOSPITAL_COMMUNITY): Payer: Self-pay

## 2021-04-25 ENCOUNTER — Other Ambulatory Visit (HOSPITAL_COMMUNITY): Payer: Self-pay

## 2021-04-26 ENCOUNTER — Ambulatory Visit: Payer: Medicaid Other | Admitting: Nurse Practitioner

## 2021-05-18 ENCOUNTER — Other Ambulatory Visit (HOSPITAL_COMMUNITY): Payer: Self-pay

## 2021-05-18 MED FILL — Lamotrigine Tab 200 MG: ORAL | 90 days supply | Qty: 180 | Fill #1 | Status: AC

## 2021-05-19 ENCOUNTER — Other Ambulatory Visit (HOSPITAL_COMMUNITY): Payer: Self-pay

## 2021-05-21 ENCOUNTER — Other Ambulatory Visit (HOSPITAL_COMMUNITY): Payer: Self-pay

## 2021-05-22 ENCOUNTER — Other Ambulatory Visit (HOSPITAL_COMMUNITY): Payer: Self-pay

## 2021-06-20 ENCOUNTER — Telehealth: Payer: Self-pay

## 2021-06-20 NOTE — Telephone Encounter (Signed)
..  Patient declines further follow up and engagement by the Managed Medicaid Team. Appropriate care team members and provider have been notified via electronic communication. The Managed Medicaid Team is available to follow up with the patient after provider conversation with the patient regarding recommendation for engagement and subsequent re-referral to the Managed Medicaid Team.    Patient has moved out of the state.  Weston Settle Care Guide, High Risk Medicaid Managed Care Embedded Care Coordination [facility]  [facility]

## 2021-06-21 ENCOUNTER — Other Ambulatory Visit: Payer: Self-pay | Admitting: *Deleted

## 2021-06-21 ENCOUNTER — Other Ambulatory Visit: Payer: Self-pay

## 2021-06-21 NOTE — Patient Instructions (Signed)
Thank you for taking time to speak with me today about care coordination and care management services available to you at no cost as part of your Medicaid benefit. These services are voluntary. Our team is available to provide assistance regarding your health care needs at any time. Please do not hesitate to reach out to me if we can be of service to you at any time in the future.   Zykera Abella RN, BSN [place]  [facility] RN Care Coordinator  

## 2021-06-21 NOTE — Patient Outreach (Signed)
Care Coordination  06/21/2021  Brenley Priore [birth year] [id number]  Hanaa Payes was referred to the [facility] Managed Care High Risk team for assistance with care coordination and care management services. Care coordination/care management services as part of the Medicaid benefit was offered to the patient today. The patient declined assistance offered today.   Ms. Miranda notified MM Care Guide that she had moved out of state and no longer needed CM services provided by HR Managed Medicaid Care Team. [facility] will close the case and complete care plans.  Plan: The Medicaid Managed Care High Risk team is available at any time in the future to assist with care coordination/care management services upon referral.   Estanislado Emms RN, BSN [place]  [facility] RN Care Coordinator

## 2021-06-22 ENCOUNTER — Other Ambulatory Visit: Payer: Self-pay

## 2021-06-22 NOTE — Patient Outreach (Signed)
Patient moved out of state, closed out Care Plan and will f/u if she returns to [facility]

## 2021-07-02 ENCOUNTER — Ambulatory Visit: Payer: Medicaid Other | Admitting: Neurology

## 2022-04-12 ENCOUNTER — Telehealth: Payer: Self-pay | Admitting: Neurology
